# Patient Record
Sex: Male | Born: 1960 | Race: Black or African American | Hispanic: No | Marital: Single | Smoking: Never smoker
Health system: Southern US, Community
[De-identification: ages and names within clinical notes are randomized; demographics above are authoritative.]

## PROBLEM LIST (undated history)

## (undated) DIAGNOSIS — N135 Crossing vessel and stricture of ureter without hydronephrosis: Secondary | ICD-10-CM

## (undated) DIAGNOSIS — E559 Vitamin D deficiency, unspecified: Secondary | ICD-10-CM

## (undated) DIAGNOSIS — S0100XA Unspecified open wound of scalp, initial encounter: Secondary | ICD-10-CM

## (undated) DIAGNOSIS — I69351 Hemiplegia and hemiparesis following cerebral infarction affecting right dominant side: Secondary | ICD-10-CM

## (undated) DIAGNOSIS — I10 Essential (primary) hypertension: Secondary | ICD-10-CM

## (undated) DIAGNOSIS — N139 Obstructive and reflux uropathy, unspecified: Secondary | ICD-10-CM

## (undated) DIAGNOSIS — I2510 Atherosclerotic heart disease of native coronary artery without angina pectoris: Secondary | ICD-10-CM

## (undated) DIAGNOSIS — E876 Hypokalemia: Secondary | ICD-10-CM

## (undated) DIAGNOSIS — I739 Peripheral vascular disease, unspecified: Secondary | ICD-10-CM

## (undated) DIAGNOSIS — N1330 Unspecified hydronephrosis: Secondary | ICD-10-CM

## (undated) DIAGNOSIS — E785 Hyperlipidemia, unspecified: Secondary | ICD-10-CM

## (undated) DIAGNOSIS — K682 Retroperitoneal fibrosis: Secondary | ICD-10-CM

## (undated) DIAGNOSIS — I639 Cerebral infarction, unspecified: Secondary | ICD-10-CM

## (undated) DIAGNOSIS — I82409 Acute embolism and thrombosis of unspecified deep veins of unspecified lower extremity: Secondary | ICD-10-CM

## (undated) DIAGNOSIS — E872 Acidosis, unspecified: Secondary | ICD-10-CM

## (undated) DIAGNOSIS — M6281 Muscle weakness (generalized): Secondary | ICD-10-CM

## (undated) DIAGNOSIS — N138 Other obstructive and reflux uropathy: Secondary | ICD-10-CM

## (undated) DIAGNOSIS — J309 Allergic rhinitis, unspecified: Secondary | ICD-10-CM

## (undated) DIAGNOSIS — N401 Enlarged prostate with lower urinary tract symptoms: Secondary | ICD-10-CM

## (undated) DIAGNOSIS — G8190 Hemiplegia, unspecified affecting unspecified side: Secondary | ICD-10-CM

## (undated) DIAGNOSIS — N184 Chronic kidney disease, stage 4 (severe): Secondary | ICD-10-CM

## (undated) DIAGNOSIS — K219 Gastro-esophageal reflux disease without esophagitis: Secondary | ICD-10-CM

## (undated) DIAGNOSIS — E119 Type 2 diabetes mellitus without complications: Secondary | ICD-10-CM

## (undated) DIAGNOSIS — H409 Unspecified glaucoma: Secondary | ICD-10-CM

## (undated) DIAGNOSIS — K5900 Constipation, unspecified: Secondary | ICD-10-CM

## (undated) HISTORY — PX: NEPHROSTOMY TUBE PLACEMENT (ARMC HX): HXRAD1726

## (undated) HISTORY — DX: Acute embolism and thrombosis of unspecified deep veins of unspecified lower extremity: I82.409

## (undated) NOTE — *Deleted (*Deleted)
Pharmacy Resident Rounding Note - for learning purposes only, not an active part of the chart  Admit Complaint: n/v, fever, blood in percutaneous nephrostomy tube, elevated lactate up to 7.8. code sepsis. 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 CBGs 200s Gastrointestinal / Nutrition - on protonix, TF Neurology - on prop, trigs 200s Nephrology HCO3 down to 14, incr bicarb gtt  CRRT started overnight 11/27 for hyperkalemia,  possibly had a seizure  SCr on admit ~5 >> 2.1 11/29 BL ~2.5, variable   -11/30 K 3.1, phos 1.7  Pulmonary - severe diffuse encephalopathy, nonspecific but not seizure/epileptic. Intubated Prop @5 , midazolam @6 , fentanyl @100  Hematology / Oncology PTA Medication Issues Best Practices   Septic shock secondary to E.faecalis, alkalosis -cont antibiotics, f/u with TTE >> possible aortic valve veg -pH 7.7, d/cd bicarb gtt, adjusted vent settings  -pH down to 7.5  AoCKD IV -cont CRRT, changed bath from 2/2.5 to 4/2.5 11/29 -no off time so far -attempted to replace tube, unsuccessful  Encephalopathy + possible seizure - EEG neg for sz, wean versed

---

## 2016-11-08 ENCOUNTER — Encounter (HOSPITAL_COMMUNITY): Payer: Self-pay

## 2016-11-08 ENCOUNTER — Emergency Department (HOSPITAL_COMMUNITY): Payer: Medicaid Other

## 2016-11-08 ENCOUNTER — Emergency Department (HOSPITAL_COMMUNITY)
Admission: EM | Admit: 2016-11-08 | Discharge: 2016-11-08 | Disposition: A | Discharge status: Home / Self Care | Payer: Medicaid Other | Attending: Emergency Medicine | Admitting: Emergency Medicine

## 2016-11-08 DIAGNOSIS — Z79899 Other long term (current) drug therapy: Secondary | ICD-10-CM | POA: Insufficient documentation

## 2016-11-08 DIAGNOSIS — Y658 Other specified misadventures during surgical and medical care: Secondary | ICD-10-CM | POA: Insufficient documentation

## 2016-11-08 DIAGNOSIS — E119 Type 2 diabetes mellitus without complications: Secondary | ICD-10-CM | POA: Insufficient documentation

## 2016-11-08 DIAGNOSIS — T83022A Displacement of nephrostomy catheter, initial encounter: Secondary | ICD-10-CM | POA: Insufficient documentation

## 2016-11-08 DIAGNOSIS — I10 Essential (primary) hypertension: Secondary | ICD-10-CM | POA: Diagnosis not present

## 2016-11-08 DIAGNOSIS — N200 Calculus of kidney: Secondary | ICD-10-CM

## 2016-11-08 HISTORY — DX: Constipation, unspecified: K59.00

## 2016-11-08 HISTORY — DX: Unspecified glaucoma: H40.9

## 2016-11-08 HISTORY — DX: Essential (primary) hypertension: I10

## 2016-11-08 HISTORY — DX: Type 2 diabetes mellitus without complications: E11.9

## 2016-11-08 HISTORY — DX: Gastro-esophageal reflux disease without esophagitis: K21.9

## 2016-11-08 LAB — URINALYSIS, ROUTINE W REFLEX MICROSCOPIC
BILIRUBIN URINE: NEGATIVE
Glucose, UA: NEGATIVE mg/dL
KETONES UR: NEGATIVE mg/dL
NITRITE: POSITIVE — AB
PH: 7 (ref 5.0–8.0)
Protein, ur: NEGATIVE mg/dL
Specific Gravity, Urine: 1.006 (ref 1.005–1.030)

## 2016-11-08 NOTE — ED Notes (Signed)
Pt stable and ready for transport back to [place] via their transportation service.  Report called to Hermine Messick at [facility].

## 2016-11-08 NOTE — ED Provider Notes (Signed)
[facility] DEPT Provider Note   CSN: [id number] Arrival date & time: 11/08/16  1214  By signing my name below, I, Rayna Sexton, attest that this documentation has been prepared under the direction and in the presence of Carmin Muskrat, MD. Electronically Signed: Rayna Sexton, ED Scribe. 11/08/16. 12:49 PM.   History   Chief Complaint Chief Complaint  Patient presents with  . nephrostomy tube leaking    HPI HPI Comments: Brendan [name] is a 55 y.o. male who presents to the Emergency Department by ambulance from [facility] for evaluation of a leaking left sided nephrostomy tube x 1 day. He states it was pulled out yesterday and the staff attempted to fix it resulting in it leaking around the tube since. He is unsure of the urologist who initially placed the tube. Pt states the nephrostomy tube was placed due to a "bad kidney". Pt reports associated leg swelling but states it is chronic and denies it has acutely worsened. Before residing in [place] he was incarcerated. He denies fevers, chills, nausea, vomiting or diarrhea.    The history is provided by the patient and medical records. No language interpreter was used.    Past Medical History:  Diagnosis Date  . Constipation   . Diabetes mellitus without complication ([place])   . GERD (gastroesophageal reflux disease)   . Glaucoma   . Hypertension   . Renal disorder     There are no active problems to display for this patient.   Past Surgical History:  Procedure Laterality Date  . NEPHROSTOMY TUBE PLACEMENT ([place] [facility])         Home Medications    Prior to Admission medications   Not on File    Family History No family history on file.  Social History Social History  Substance Use Topics  . Smoking status: Not on file  . Smokeless tobacco: Not on file  . Alcohol use Not on file     Allergies   Other   Review of Systems Review of Systems  Constitutional:       Per HPI, otherwise negative    HENT:       Per HPI, otherwise negative  Respiratory:       Per HPI, otherwise negative  Cardiovascular:       Per HPI, otherwise negative  Gastrointestinal: Negative for vomiting.  Endocrine:       Negative aside from HPI  Genitourinary:       No hematuria, no dysuria. Patient does not know why he has a nephrostomy.  Musculoskeletal:       Per HPI, otherwise negative  Skin: Negative for color change.  Neurological: Negative for syncope.    Physical Exam Updated Vital Signs BP 140/95 (BP Location: Right Arm)   Pulse 79   Temp 98.4 F (36.9 C) (Oral)   Resp 16   Ht 6\' 1"  (1.854 m)   Wt 234 lb (106.1 kg)   SpO2 100%   BMI 30.87 kg/m   Physical Exam  Constitutional: He is oriented to person, place, and time. He appears well-developed. No distress.  HENT:  Head: Normocephalic and atraumatic.  Eyes: Conjunctivae and EOM are normal.  Cardiovascular: Normal rate and regular rhythm.   Pulmonary/Chest: Effort normal. No stridor. No respiratory distress.  Abdominal: He exhibits no distension.    Musculoskeletal: He exhibits no edema.  Neurological: He is alert and oriented to person, place, and time.  Skin: Skin is warm and dry.  Psychiatric: He has a  normal mood and affect. He is slowed.  Little insight  Nursing note and vitals reviewed.   ED Treatments / Results  Labs (all labs ordered are listed, but only abnormal results are displayed) Labs Reviewed  URINALYSIS, ROUTINE W REFLEX MICROSCOPIC - Abnormal; Notable for the following:       Result Value   Color, Urine STRAW (*)    Hgb urine dipstick LARGE (*)    Nitrite POSITIVE (*)    Leukocytes, UA LARGE (*)    Bacteria, UA RARE (*)    All other components within normal limits     Radiology Dg Abd 1 View  Result Date: 11/08/2016 CLINICAL DATA:  Making left nephrostomy tube EXAM: ABDOMEN - 1 VIEW COMPARISON:  None. FINDINGS: Abdominal films show that the ring marker is very lateral in position. This marker  should lie a over the expected renal pelvis approximately 10 cm more medially. Also the loop that generally overlies the renal pelvis is no longer present and the tube therefore is at risk for pulling out. The tip of the nephrostomy catheter extends into the expected distal left ureter. The bowel gas pattern is nonspecific. IMPRESSION: The tube appears to have retracted somewhat with the ring marker near the expected skin surface instead of overlying the left renal pelvis. Also the loop which is generally located at that site over the renal pelvis is no longer formed and therefore the tube is at risk for falling out. Electronically Signed   By: Ivar Drape M.D.   On: 11/08/2016 14:04    Procedures Procedures  DIAGNOSTIC STUDIES: Oxygen Saturation is 100% on RA, normal by my interpretation.    COORDINATION OF CARE: 12:46 PM Discussed next steps with pt. Pt verbalized understanding and is agreeable with the plan.    Medications Ordered in ED Medications - No data to display 3:54 PM  With a symptomatic bacteriuria, no empiric antibiotics indicated. We discussed the need for interventional radiology replacement of nephrostomy tube  Initial Impression / Assessment and Plan / ED Course  I have reviewed the triage vital signs and the nursing notes.  Pertinent labs & imaging results that were available during my care of the patient were reviewed by me and considered in my medical decision making (see chart for details).  Clinical Course     On repeat exam the patient is in no distress. I have discussed patient's case with our urology colleagues, and was interventional radiology. We confirmed that absent symptoms, patient will now receive empiric antibiotics. Patient will be scheduled for interventional radiology replacement of his nephrostomy tube tomorrow at one of our affiliated facilities He will be returned to his nursing facility, receive instructions in the morning as to which facility he  should go to tomorrow. The tube continues to drain urine, and absent distress, stable vital signs, semiurgent replacement tomorrow is reasonable.    Final Clinical Impressions(s) / ED Diagnoses  I personally performed the services described in this documentation, which was scribed in my presence. The recorded information has been reviewed and is accurate.      Carmin Muskrat, MD 11/08/16 [phone]

## 2016-11-08 NOTE — ED Triage Notes (Signed)
Pt from [facility] and sent to er for eval of nephrostomy tube leaking.  Denies any complaints.

## 2016-11-08 NOTE — Discharge Instructions (Signed)
Tomorrow you will receive a telephone call directing you to one of the [facility] facilities for replacement of your nephrostomy tube.  If you develop new, or concerning changes return here.

## 2016-11-09 ENCOUNTER — Other Ambulatory Visit (HOSPITAL_COMMUNITY): Payer: Self-pay | Admitting: Emergency Medicine

## 2016-11-09 ENCOUNTER — Ambulatory Visit (HOSPITAL_COMMUNITY)
Admission: RE | Admit: 2016-11-09 | Discharge: 2016-11-09 | Disposition: A | Discharge status: Home / Self Care | Payer: Medicaid Other | Source: Ambulatory Visit | Attending: Emergency Medicine | Admitting: Emergency Medicine

## 2016-11-09 ENCOUNTER — Encounter (HOSPITAL_COMMUNITY): Payer: Self-pay | Admitting: Interventional Radiology

## 2016-11-09 ENCOUNTER — Ambulatory Visit (HOSPITAL_COMMUNITY): Payer: Self-pay

## 2016-11-09 ENCOUNTER — Inpatient Hospital Stay (HOSPITAL_COMMUNITY): Admit: 2016-11-09 | Payer: Self-pay

## 2016-11-09 DIAGNOSIS — N1330 Unspecified hydronephrosis: Secondary | ICD-10-CM

## 2016-11-09 DIAGNOSIS — T83012A Breakdown (mechanical) of nephrostomy catheter, initial encounter: Secondary | ICD-10-CM | POA: Diagnosis not present

## 2016-11-09 DIAGNOSIS — Y732 Prosthetic and other implants, materials and accessory gastroenterology and urology devices associated with adverse incidents: Secondary | ICD-10-CM | POA: Insufficient documentation

## 2016-11-09 HISTORY — PX: IR GENERIC HISTORICAL: IMG1180011

## 2016-11-09 MED ORDER — LIDOCAINE HCL 1 % IJ SOLN
INTRAMUSCULAR | Status: AC
Start: 2016-11-09 — End: 2016-11-10
  Filled 2016-11-09: qty 20

## 2016-11-09 MED ORDER — IOPAMIDOL (ISOVUE-300) INJECTION 61%
INTRAVENOUS | Status: AC
  Administered 2016-11-09: 15 mL
  Filled 2016-11-09: qty 50

## 2016-11-09 MED ORDER — LIDOCAINE HCL 1 % IJ SOLN
INTRAMUSCULAR | Status: DC | PRN
  Administered 2016-11-09: 5 mL

## 2016-11-09 NOTE — Procedures (Signed)
Interventional Radiology Procedure Note  Patient presents for exchange of malfunctioning nephroureteral tube, which has been withdrawn from the kidney.  Patient tells me this has been placed in [place], but does not know where or by whom, or for what reason.    We have no records.    He was referred by [facility] ED when he came for "leaking", which is due to the sideholes out of the skin.   Procedure: Exchange of malfunctioning nephroureteral tube for a new 69F left PCN.    Complications: None  Recommendations:  - Keep to gravity drainage - follow up with urology and primary care.  Etiology of ureteral obstruction is unknown. - Routine care - Routine exchange if needed.   Signed,  Dulcy Fanny. Earleen Newport, DO

## 2016-12-19 ENCOUNTER — Other Ambulatory Visit: Payer: Self-pay | Admitting: Urology

## 2016-12-19 ENCOUNTER — Ambulatory Visit (INDEPENDENT_AMBULATORY_CARE_PROVIDER_SITE_OTHER): Payer: Self-pay | Admitting: Urology

## 2016-12-19 DIAGNOSIS — N1330 Unspecified hydronephrosis: Secondary | ICD-10-CM

## 2016-12-19 DIAGNOSIS — N135 Crossing vessel and stricture of ureter without hydronephrosis: Secondary | ICD-10-CM

## 2017-01-09 ENCOUNTER — Encounter (HOSPITAL_COMMUNITY): Payer: Self-pay | Admitting: Interventional Radiology

## 2017-01-09 ENCOUNTER — Other Ambulatory Visit: Payer: Self-pay | Admitting: Urology

## 2017-01-09 ENCOUNTER — Ambulatory Visit (HOSPITAL_COMMUNITY)
Admission: RE | Admit: 2017-01-09 | Discharge: 2017-01-09 | Disposition: A | Discharge status: Home / Self Care | Payer: Medicaid Other | Source: Ambulatory Visit | Attending: Urology | Admitting: Urology

## 2017-01-09 DIAGNOSIS — Z436 Encounter for attention to other artificial openings of urinary tract: Secondary | ICD-10-CM | POA: Diagnosis not present

## 2017-01-09 DIAGNOSIS — N1330 Unspecified hydronephrosis: Secondary | ICD-10-CM

## 2017-01-09 DIAGNOSIS — N135 Crossing vessel and stricture of ureter without hydronephrosis: Secondary | ICD-10-CM | POA: Diagnosis not present

## 2017-01-09 HISTORY — PX: IR GENERIC HISTORICAL: IMG1180011

## 2017-01-09 MED ORDER — IOPAMIDOL (ISOVUE-300) INJECTION 61%
INTRAVENOUS | Status: AC
  Filled 2017-01-09: qty 50

## 2017-01-09 MED ORDER — IOPAMIDOL (ISOVUE-300) INJECTION 61%
50.0000 mL | Freq: Once | INTRAVENOUS | Status: AC | PRN
  Administered 2017-01-09: 20 mL via INTRAVENOUS

## 2017-01-09 MED ORDER — LIDOCAINE HCL 1 % IJ SOLN
INTRAMUSCULAR | Status: AC
  Filled 2017-01-09: qty 20

## 2017-02-16 ENCOUNTER — Emergency Department (HOSPITAL_COMMUNITY): Payer: Medicaid Other

## 2017-02-16 ENCOUNTER — Encounter (HOSPITAL_COMMUNITY): Payer: Self-pay | Admitting: Emergency Medicine

## 2017-02-16 ENCOUNTER — Inpatient Hospital Stay (HOSPITAL_COMMUNITY)
Admission: EM | Admit: 2017-02-16 | Discharge: 2017-02-20 | DRG: 690 | Disposition: A | Discharge status: Home / Self Care | Payer: Medicaid Other | Attending: Family Medicine | Admitting: Family Medicine

## 2017-02-16 ENCOUNTER — Inpatient Hospital Stay (HOSPITAL_COMMUNITY): Admission: RE | Admit: 2017-02-16 | Payer: Medicaid Other | Source: Ambulatory Visit

## 2017-02-16 DIAGNOSIS — E785 Hyperlipidemia, unspecified: Secondary | ICD-10-CM | POA: Diagnosis present

## 2017-02-16 DIAGNOSIS — I639 Cerebral infarction, unspecified: Secondary | ICD-10-CM | POA: Diagnosis not present

## 2017-02-16 DIAGNOSIS — N189 Chronic kidney disease, unspecified: Secondary | ICD-10-CM | POA: Diagnosis not present

## 2017-02-16 DIAGNOSIS — N401 Enlarged prostate with lower urinary tract symptoms: Secondary | ICD-10-CM | POA: Diagnosis not present

## 2017-02-16 DIAGNOSIS — K219 Gastro-esophageal reflux disease without esophagitis: Secondary | ICD-10-CM | POA: Diagnosis present

## 2017-02-16 DIAGNOSIS — N138 Other obstructive and reflux uropathy: Secondary | ICD-10-CM | POA: Diagnosis not present

## 2017-02-16 DIAGNOSIS — N390 Urinary tract infection, site not specified: Secondary | ICD-10-CM

## 2017-02-16 DIAGNOSIS — H409 Unspecified glaucoma: Secondary | ICD-10-CM | POA: Insufficient documentation

## 2017-02-16 DIAGNOSIS — R55 Syncope and collapse: Secondary | ICD-10-CM

## 2017-02-16 DIAGNOSIS — K5900 Constipation, unspecified: Secondary | ICD-10-CM | POA: Diagnosis not present

## 2017-02-16 DIAGNOSIS — E1122 Type 2 diabetes mellitus with diabetic chronic kidney disease: Secondary | ICD-10-CM | POA: Diagnosis present

## 2017-02-16 DIAGNOSIS — S0990XA Unspecified injury of head, initial encounter: Secondary | ICD-10-CM

## 2017-02-16 DIAGNOSIS — Z7952 Long term (current) use of systemic steroids: Secondary | ICD-10-CM

## 2017-02-16 DIAGNOSIS — N179 Acute kidney failure, unspecified: Secondary | ICD-10-CM | POA: Diagnosis present

## 2017-02-16 DIAGNOSIS — K682 Retroperitoneal fibrosis: Secondary | ICD-10-CM | POA: Diagnosis present

## 2017-02-16 DIAGNOSIS — E119 Type 2 diabetes mellitus without complications: Secondary | ICD-10-CM | POA: Diagnosis not present

## 2017-02-16 DIAGNOSIS — I2510 Atherosclerotic heart disease of native coronary artery without angina pectoris: Secondary | ICD-10-CM | POA: Diagnosis present

## 2017-02-16 DIAGNOSIS — N184 Chronic kidney disease, stage 4 (severe): Secondary | ICD-10-CM | POA: Diagnosis present

## 2017-02-16 DIAGNOSIS — I10 Essential (primary) hypertension: Secondary | ICD-10-CM | POA: Diagnosis not present

## 2017-02-16 DIAGNOSIS — N135 Crossing vessel and stricture of ureter without hydronephrosis: Secondary | ICD-10-CM | POA: Diagnosis present

## 2017-02-16 DIAGNOSIS — W19XXXA Unspecified fall, initial encounter: Secondary | ICD-10-CM | POA: Diagnosis not present

## 2017-02-16 DIAGNOSIS — I69351 Hemiplegia and hemiparesis following cerebral infarction affecting right dominant side: Secondary | ICD-10-CM

## 2017-02-16 DIAGNOSIS — N12 Tubulo-interstitial nephritis, not specified as acute or chronic: Secondary | ICD-10-CM | POA: Diagnosis not present

## 2017-02-16 DIAGNOSIS — I959 Hypotension, unspecified: Secondary | ICD-10-CM

## 2017-02-16 DIAGNOSIS — M79604 Pain in right leg: Secondary | ICD-10-CM

## 2017-02-16 DIAGNOSIS — R531 Weakness: Secondary | ICD-10-CM

## 2017-02-16 HISTORY — DX: Hyperlipidemia, unspecified: E78.5

## 2017-02-16 HISTORY — DX: Benign prostatic hyperplasia with lower urinary tract symptoms: N40.1

## 2017-02-16 HISTORY — DX: Benign prostatic hyperplasia with lower urinary tract symptoms: N13.8

## 2017-02-16 HISTORY — DX: Muscle weakness (generalized): M62.81

## 2017-02-16 HISTORY — DX: Chronic kidney disease, stage 4 (severe): N18.4

## 2017-02-16 HISTORY — DX: Crossing vessel and stricture of ureter without hydronephrosis: N13.5

## 2017-02-16 HISTORY — DX: Hemiplegia and hemiparesis following cerebral infarction affecting right dominant side: I69.351

## 2017-02-16 HISTORY — DX: Essential (primary) hypertension: I10

## 2017-02-16 HISTORY — DX: Obstructive and reflux uropathy, unspecified: N13.9

## 2017-02-16 HISTORY — DX: Atherosclerotic heart disease of native coronary artery without angina pectoris: I25.10

## 2017-02-16 HISTORY — DX: Hypokalemia: E87.6

## 2017-02-16 HISTORY — DX: Cerebral infarction, unspecified: I63.9

## 2017-02-16 LAB — CBC WITH DIFFERENTIAL/PLATELET
BASOS ABS: 0 10*3/uL (ref 0.0–0.1)
Basophils Relative: 0 %
Eosinophils Absolute: 0.2 10*3/uL (ref 0.0–0.7)
Eosinophils Relative: 2 %
HEMATOCRIT: 37.3 % — AB (ref 39.0–52.0)
Hemoglobin: 12 g/dL — ABNORMAL LOW (ref 13.0–17.0)
LYMPHS PCT: 16 %
Lymphs Abs: 1.2 10*3/uL (ref 0.7–4.0)
MCH: 26.4 pg (ref 26.0–34.0)
MCHC: 32.2 g/dL (ref 30.0–36.0)
MCV: 82 fL (ref 78.0–100.0)
Monocytes Absolute: 0.4 10*3/uL (ref 0.1–1.0)
Monocytes Relative: 5 %
NEUTROS ABS: 5.9 10*3/uL (ref 1.7–7.7)
Neutrophils Relative %: 77 %
Platelets: 411 10*3/uL — ABNORMAL HIGH (ref 150–400)
RBC: 4.55 MIL/uL (ref 4.22–5.81)
RDW: 16.5 % — AB (ref 11.5–15.5)
WBC: 7.6 10*3/uL (ref 4.0–10.5)

## 2017-02-16 LAB — URINALYSIS, ROUTINE W REFLEX MICROSCOPIC
Bilirubin Urine: NEGATIVE
GLUCOSE, UA: NEGATIVE mg/dL
Ketones, ur: NEGATIVE mg/dL
Nitrite: NEGATIVE
Protein, ur: 30 mg/dL — AB
Specific Gravity, Urine: 1.01 (ref 1.005–1.030)
pH: 7 (ref 5.0–8.0)

## 2017-02-16 LAB — COMPREHENSIVE METABOLIC PANEL
ALBUMIN: 3.1 g/dL — AB (ref 3.5–5.0)
ALT: 32 U/L (ref 17–63)
AST: 61 U/L — AB (ref 15–41)
Alkaline Phosphatase: 93 U/L (ref 38–126)
Anion gap: 9 (ref 5–15)
BUN: 39 mg/dL — ABNORMAL HIGH (ref 6–20)
CHLORIDE: 99 mmol/L — AB (ref 101–111)
CO2: 30 mmol/L (ref 22–32)
CREATININE: 2.59 mg/dL — AB (ref 0.61–1.24)
Calcium: 9.7 mg/dL (ref 8.9–10.3)
GFR calc non Af Amer: 26 mL/min — ABNORMAL LOW (ref >60)
GFR, EST AFRICAN AMERICAN: 30 mL/min — AB (ref >60)
GLUCOSE: 87 mg/dL (ref 65–99)
Potassium: 4.7 mmol/L (ref 3.5–5.1)
SODIUM: 138 mmol/L (ref 135–145)
Total Bilirubin: 0.2 mg/dL — ABNORMAL LOW (ref 0.3–1.2)
Total Protein: 9.5 g/dL — ABNORMAL HIGH (ref 6.5–8.1)

## 2017-02-16 LAB — TROPONIN I: TROPONIN I: 0.03 ng/mL — AB (ref <0.03)

## 2017-02-16 LAB — LIPASE, BLOOD: Lipase: 27 U/L (ref 11–51)

## 2017-02-16 LAB — URINALYSIS, MICROSCOPIC (REFLEX)

## 2017-02-16 LAB — GLUCOSE, CAPILLARY: GLUCOSE-CAPILLARY: 84 mg/dL (ref 65–99)

## 2017-02-16 LAB — I-STAT CG4 LACTIC ACID, ED: Lactic Acid, Venous: 1.61 mmol/L (ref 0.5–1.9)

## 2017-02-16 MED ORDER — DRONABINOL 2.5 MG PO CAPS
2.5000 mg | ORAL_CAPSULE | Freq: Every day | ORAL | Status: DC
  Administered 2017-02-16: 2.5 mg via ORAL
  Filled 2017-02-16: qty 1

## 2017-02-16 MED ORDER — POLYETHYLENE GLYCOL 3350 17 G PO PACK
17.0000 g | PACK | Freq: Every day | ORAL | Status: DC
  Administered 2017-02-17 – 2017-02-20 (×4): 17 g via ORAL
  Filled 2017-02-16 (×5): qty 1

## 2017-02-16 MED ORDER — ONDANSETRON HCL 4 MG/2ML IJ SOLN: 4.0000 mg | Freq: Four times a day (QID) | INTRAMUSCULAR | Status: DC | PRN

## 2017-02-16 MED ORDER — INSULIN ASPART 100 UNIT/ML ~~LOC~~ SOLN: 0.0000 [IU] | Freq: Every day | SUBCUTANEOUS | Status: DC

## 2017-02-16 MED ORDER — FERROUS SULFATE 325 (65 FE) MG PO TABS
325.0000 mg | ORAL_TABLET | Freq: Every day | ORAL | Status: DC
  Administered 2017-02-17 – 2017-02-20 (×4): 325 mg via ORAL
  Filled 2017-02-16 (×5): qty 1

## 2017-02-16 MED ORDER — TAMSULOSIN HCL 0.4 MG PO CAPS
0.4000 mg | ORAL_CAPSULE | Freq: Every day | ORAL | Status: DC
  Administered 2017-02-16 – 2017-02-19 (×4): 0.4 mg via ORAL
  Filled 2017-02-16 (×4): qty 1

## 2017-02-16 MED ORDER — SIMVASTATIN 20 MG PO TABS
40.0000 mg | ORAL_TABLET | Freq: Every day | ORAL | Status: DC
Start: 2017-02-16 — End: 2017-02-20
  Administered 2017-02-16 – 2017-02-19 (×4): 40 mg via ORAL
  Filled 2017-02-16 (×4): qty 2

## 2017-02-16 MED ORDER — INSULIN ASPART 100 UNIT/ML ~~LOC~~ SOLN: 0.0000 [IU] | Freq: Three times a day (TID) | SUBCUTANEOUS | Status: DC

## 2017-02-16 MED ORDER — HYDROCODONE-ACETAMINOPHEN 5-325 MG PO TABS
1.0000 | ORAL_TABLET | Freq: Four times a day (QID) | ORAL | Status: DC | PRN
Start: 2017-02-16 — End: 2017-02-20

## 2017-02-16 MED ORDER — POLYETHYLENE GLYCOL 3350 17 G PO PACK: 17.0000 g | PACK | Freq: Every day | ORAL | Status: DC | PRN

## 2017-02-16 MED ORDER — ENOXAPARIN SODIUM 40 MG/0.4ML ~~LOC~~ SOLN
40.0000 mg | SUBCUTANEOUS | Status: DC
  Administered 2017-02-16 – 2017-02-19 (×4): 40 mg via SUBCUTANEOUS
  Filled 2017-02-16 (×4): qty 0.4

## 2017-02-16 MED ORDER — SODIUM CHLORIDE 0.9 % IV BOLUS (SEPSIS)
500.0000 mL | Freq: Once | INTRAVENOUS | Status: AC
  Administered 2017-02-16: 500 mL via INTRAVENOUS

## 2017-02-16 MED ORDER — DEXTROSE 5 % IV SOLN
1.0000 g | INTRAVENOUS | Status: DC
  Filled 2017-02-16 (×2): qty 10

## 2017-02-16 MED ORDER — ONDANSETRON HCL 4 MG PO TABS: 4.0000 mg | ORAL_TABLET | Freq: Four times a day (QID) | ORAL | Status: DC | PRN

## 2017-02-16 MED ORDER — ASPIRIN 81 MG PO CHEW
81.0000 mg | CHEWABLE_TABLET | Freq: Every day | ORAL | Status: DC
  Administered 2017-02-17 – 2017-02-20 (×4): 81 mg via ORAL
  Filled 2017-02-16 (×4): qty 1

## 2017-02-16 MED ORDER — DEXTROSE 5 % IV SOLN
2.0000 g | Freq: Once | INTRAVENOUS | Status: AC
  Administered 2017-02-16: 2 g via INTRAVENOUS
  Filled 2017-02-16: qty 2

## 2017-02-16 MED ORDER — ACETAMINOPHEN 325 MG PO TABS: 650.0000 mg | ORAL_TABLET | Freq: Four times a day (QID) | ORAL | Status: DC | PRN

## 2017-02-16 MED ORDER — ACETAMINOPHEN 650 MG RE SUPP: 650.0000 mg | Freq: Four times a day (QID) | RECTAL | Status: DC | PRN

## 2017-02-16 MED ORDER — SODIUM BICARBONATE 650 MG PO TABS
650.0000 mg | ORAL_TABLET | Freq: Two times a day (BID) | ORAL | Status: DC
  Administered 2017-02-16 – 2017-02-20 (×8): 650 mg via ORAL
  Filled 2017-02-16 (×8): qty 1

## 2017-02-16 MED ORDER — SODIUM CHLORIDE 0.9 % IV SOLN
INTRAVENOUS | Status: DC
  Administered 2017-02-16 – 2017-02-19 (×3): via INTRAVENOUS

## 2017-02-16 NOTE — ED Notes (Signed)
[facility] informed at this time that resident was going to be admitted to the hospital.

## 2017-02-16 NOTE — H&P (Signed)
History and Physical  [name] UJW:[id number] DOB: 04-21-1961 DOA: 02/16/2017  Referring physician: Dr. Thurnell Garbe, ED physician PCP: Brendan Mccreedy, MD  Outpatient Specialists: None  Patient [name]  Chief Complaint: Fall  HPI: [name] is a 56 y.o. male with a history of obstructive uropathy with left-sided nephrostomy tube, retroperitoneal fibrosis with ureteral stricture, GERD, diabetes type 2 diet-controlled, hypertension, hyperlipidemia, history of stroke with residual right-sided hemiparesis, chronic kidney disease. He was recently treated for UTI at the beginning of month with stopping antibiotics around the sixth. He has been on CellCept due to the obstructive uropathy and retroperitoneal fibrosis, however this is been held due to concerns that the CellCept may be causing recurrent urinary tract infections.  He was being transported to [name] for nephrostomy tube replacement. During the transport, the patient was attempting to stand to transport when his lower leg got caught patient fell forward and hit his head. Patient was brought to the [name] for evaluation. He is unable to say whether he had loss of consciousness or not. He has been increasingly weak with lack of appetite over the past several weeks. This appears to have started after a viral gastroenteritis infection. He still has some nausea and diminished appetite. Is supposed to start an appetite stimulant (Marinol) however he has not started yet. No palliating or provoking factors.  Emergency Department Course: Head CT was negative for acute bleed or injury. X-ray was negative and lower leg for fracture. Urinalysis shows large leukocytes with white blood cells too numerous to count on the micro-. Negative nitrites.  Review of Systems:   Patient complains of chills, nausea.  Pt denies any fevers, diarrhea, constipation, abdominal pain, shortness of breath, dyspnea on exertion,  orthopnea, cough, wheezing, palpitations, headache, vision changes, lightheadedness, dizziness, melena, rectal bleeding.  Review of systems are otherwise negative  Past Medical History:  Diagnosis Date  . Acute unilateral obstructive uropathy   . Chronic kidney disease, stage IV (severe) ([place])   . Constipation   . Coronary atherosclerosis of native coronary artery   . Diabetes mellitus without complication ([place])   . Enlarged prostate with urinary obstruction   . Essential hypertension, malignant   . GERD (gastroesophageal reflux disease)   . Glaucoma   . Hemiparesis affecting right side as late effect of cerebrovascular accident ([place])   . Hyperlipidemia   . Hypertension   . Hypokalemia   . Muscle weakness (generalized)   . Renal disorder   . Stricture or kinking of ureter   . Stroke [name])    Past Surgical History:  Procedure Laterality Date  . IR GENERIC HISTORICAL  11/09/2016   IR NEPHROSTOMY EXCHANGE LEFT 11/09/2016 Corrie Mckusick, DO [facility]-INTER[facility] RAD  . IR GENERIC HISTORICAL  01/09/2017   IR NEPHROSTOMY EXCHANGE LEFT 01/09/2017 [name], MD [facility]INTER[facility] RAD  . NEPHROSTOMY TUBE PLACEMENT ([place] [facility])     Social History:  has no tobacco, alcohol, and drug history on file. Patient lives [name]  Allergies  Allergen Reactions  . Other     TB serum-reaction unknown. Provided via [name] records     No family history on file.  Patient unable to recall family history  Prior to Admission medications   Medication Sig Start Date End Date Taking? Authorizing Provider  acetaminophen (TYLENOL) 325 MG tablet Take 650 mg by mouth 3 (three) times daily as needed for mild pain or moderate pain.   Yes Historical Provider, MD  aspirin 81  MG chewable tablet Chew 81 mg by mouth daily.   Yes Historical Provider, MD  calcium carbonate (CALTRATE 600) 1500 (600 Ca) MG TABS tablet Take 1 tablet by mouth 2 (two) times daily.   Yes Historical Provider, MD  cholecalciferol (VITAMIN D) 400  units TABS tablet Take 400 Units by mouth daily.   Yes Historical Provider, MD  dronabinol (MARINOL) 2.5 MG capsule Take 2.5 mg by mouth at bedtime.   Yes Historical Provider, MD  ferrous sulfate 325 (65 FE) MG tablet Take 325 mg by mouth daily with breakfast.   Yes Historical Provider, MD  furosemide (LASIX) 40 MG tablet Take 40 mg by mouth daily.   Yes Historical Provider, MD  HYDROcodone-acetaminophen (NORCO/VICODIN) 5-325 MG tablet Take 1 tablet by mouth every 6 (six) hours as needed for moderate pain.   Yes Historical Provider, MD  mycophenolate (CELLCEPT) 500 MG tablet Take 1,000 mg by mouth every 12 (twelve) hours.   Yes Historical Provider, MD  polyethylene glycol (MIRALAX / GLYCOLAX) packet Take 17 g by mouth daily.   Yes Historical Provider, MD  Probiotic Product (PROBIOTIC PO) Take 1 capsule by mouth daily.   Yes Historical Provider, MD  simvastatin (ZOCOR) 40 MG tablet Take 40 mg by mouth at bedtime.   Yes Historical Provider, MD  sodium bicarbonate 650 MG tablet Take 650 mg by mouth every 12 (twelve) hours.   Yes Historical Provider, MD  tamsulosin (FLOMAX) 0.4 MG CAPS capsule Take 0.4 mg by mouth at bedtime.   Yes Historical Provider, MD    Physical Exam: BP 110/83   Pulse 90   Temp 98 F (36.7 C) (Oral)   Resp 10   Ht 6\' 1"  (1.854 m)   SpO2 100%   General: Middle-aged black male appears older than stated age. Awake and alert and oriented x3. No acute cardiopulmonary distress.  HEENT: Normocephalic atraumatic.  Right and left ears normal in appearance.  Pupils equal, round, reactive to light. Extraocular muscles are intact. Sclerae anicteric and noninjected.  Moist mucosal membranes. No mucosal lesions.  Neck: Neck supple without lymphadenopathy. No carotid bruits. No masses palpated.  Cardiovascular: Regular rate with normal S1-S2 sounds. No murmurs, rubs, gallops auscultated. No JVD.  Respiratory: Good respiratory effort with no wheezes, rales, rhonchi. Lungs clear to  auscultation bilaterally.  No accessory muscle use. Abdomen: Soft, nontender, nondistended. Active bowel sounds. No masses or hepatosplenomegaly  Skin: No rashes, lesions, or ulcerations.  Dry, warm to touch. 2+ dorsalis pedis and radial pulses. Musculoskeletal: No calf or leg pain. All major joints not erythematous nontender.  No upper or lower joint deformation.  Good ROM.  No contractures  Psychiatric: Intact judgment and insight. Pleasant and cooperative. Neurologic: No focal neurological deficits. Strength is 5/5 and symmetric in upper and lower extremities.  Cranial nerves II through XII are grossly intact.           Labs on Admission: I have personally reviewed following labs and imaging studies  CBC:  Recent Labs Lab 02/16/17 1337  WBC 7.6  NEUTROABS 5.9  HGB 12.0*  HCT 37.3*  MCV 82.0  PLT 371*   Basic Metabolic Panel:  Recent Labs Lab 02/16/17 1337  NA 138  K 4.7  CL 99*  CO2 30  GLUCOSE 87  BUN 39*  CREATININE 2.59*  CALCIUM 9.7   GFR: CrCl cannot be calculated (Unknown ideal weight.). Liver Function Tests:  Recent Labs Lab 02/16/17 1337  AST 61*  ALT 32  ALKPHOS 93  BILITOT 0.2*  PROT 9.5*  ALBUMIN 3.1*    Recent Labs Lab 02/16/17 1337  LIPASE 27   No results for input(s): AMMONIA in the last 168 hours. Coagulation Profile: No results for input(s): INR, PROTIME in the last 168 hours. Cardiac Enzymes:  Recent Labs Lab 02/16/17 1337  TROPONINI 0.03*   BNP (last 3 results) No results for input(s): PROBNP in the last 8760 hours. HbA1C: No results for input(s): HGBA1C in the last 72 hours. CBG: No results for input(s): GLUCAP in the last 168 hours. Lipid Profile: No results for input(s): CHOL, HDL, LDLCALC, TRIG, CHOLHDL, LDLDIRECT in the last 72 hours. Thyroid Function Tests: No results for input(s): TSH, T4TOTAL, FREET4, T3FREE, THYROIDAB in the last 72 hours. Anemia Panel: No results for input(s): VITAMINB12, FOLATE, FERRITIN,  TIBC, IRON, RETICCTPCT in the last 72 hours. Urine analysis:    Component Value Date/Time   COLORURINE YELLOW 02/16/2017 1334   APPEARANCEUR HAZY (A) 02/16/2017 1334   LABSPEC 1.010 02/16/2017 1334   PHURINE 7.0 02/16/2017 1334   GLUCOSEU NEGATIVE 02/16/2017 1334   HGBUR MODERATE (A) 02/16/2017 1334   BILIRUBINUR NEGATIVE 02/16/2017 1334   KETONESUR NEGATIVE 02/16/2017 1334   PROTEINUR 30 (A) 02/16/2017 1334   NITRITE NEGATIVE 02/16/2017 1334   LEUKOCYTESUR LARGE (A) 02/16/2017 1334   Sepsis Labs: @LABRCNTIP (procalcitonin:4,lacticidven:4) )No results found for this or any previous visit (from the past 240 hour(s)).   Radiological Exams on Admission: Dg Chest 1 View  Result Date: 02/16/2017 CLINICAL DATA:  Pain following fall EXAM: CHEST 1 VIEW COMPARISON:  None. FINDINGS: Lungs are clear. Heart size and pulmonary vascularity are normal. No adenopathy. No pneumothorax. No bone lesions. IMPRESSION: No edema or consolidation. Electronically Signed   By: Lowella Grip III M.D.   On: 02/16/2017 15:07   Dg Ankle Complete Right  Result Date: 02/16/2017 CLINICAL DATA:  Pain following fall EXAM: RIGHT ANKLE - COMPLETE 3+ VIEW COMPARISON:  None. FINDINGS: Frontal, oblique, and lateral views were obtained. There is mild soft tissue swelling. No fracture or joint effusion. The ankle mortise appears intact. No erosive change. IMPRESSION: Soft tissue swelling. No demonstrable fracture or appreciable joint space narrowing. Ankle mortise appears intact. Electronically Signed   By: Lowella Grip III M.D.   On: 02/16/2017 15:10   Ct Head Wo Contrast  Result Date: 02/16/2017 CLINICAL DATA:  Possible syncope today. The patient fell out of a wheelchair. EXAM: CT HEAD WITHOUT CONTRAST CT CERVICAL SPINE WITHOUT CONTRAST TECHNIQUE: Multidetector CT imaging of the head and cervical spine was performed following the standard protocol without intravenous contrast. Multiplanar CT image reconstructions of  the cervical spine were also generated. COMPARISON:  None. FINDINGS: CT HEAD FINDINGS Brain: Remote left MCA territory infarct is identified. No acute abnormality including hemorrhage, infarct, mass lesion, mass effect, midline shift or abnormal extra-axial fluid collection. No hydrocephalus or pneumocephalus. Vascular: Mild atherosclerosis is identified. Skull: Intact. Sinuses/Orbits: There is marked mucosal thickening with calcifications and sclerosis of the walls of the maxillary sinuses. Scattered ethmoid air cell disease is identified. Other: None. CT CERVICAL SPINE FINDINGS Alignment: The neck is in flexion.  No listhesis is identified. Skull base and vertebrae: No acute fracture. No primary bone lesion or focal pathologic process. Soft tissues and spinal canal: No prevertebral fluid or swelling. No visible canal hematoma. Disc levels:  Mild endplate spurring is seen at C5-6 and C6-7. Upper chest: Lung apices are clear. Other: None. IMPRESSION: No acute abnormality head or cervical spine. Remote left MCA infarct  and chronic microvascular ischemic change. Chronic bilateral maxillary sinus disease. Mild degenerative disc disease C5-6 and C6-7. Electronically Signed   By: Inge Rise M.D.   On: 02/16/2017 14:41   Ct Cervical Spine Wo Contrast  Result Date: 02/16/2017 CLINICAL DATA:  Possible syncope today. The patient fell out of a wheelchair. EXAM: CT HEAD WITHOUT CONTRAST CT CERVICAL SPINE WITHOUT CONTRAST TECHNIQUE: Multidetector CT imaging of the head and cervical spine was performed following the standard protocol without intravenous contrast. Multiplanar CT image reconstructions of the cervical spine were also generated. COMPARISON:  None. FINDINGS: CT HEAD FINDINGS Brain: Remote left MCA territory infarct is identified. No acute abnormality including hemorrhage, infarct, mass lesion, mass effect, midline shift or abnormal extra-axial fluid collection. No hydrocephalus or pneumocephalus. Vascular:  Mild atherosclerosis is identified. Skull: Intact. Sinuses/Orbits: There is marked mucosal thickening with calcifications and sclerosis of the walls of the maxillary sinuses. Scattered ethmoid air cell disease is identified. Other: None. CT CERVICAL SPINE FINDINGS Alignment: The neck is in flexion.  No listhesis is identified. Skull base and vertebrae: No acute fracture. No primary bone lesion or focal pathologic process. Soft tissues and spinal canal: No prevertebral fluid or swelling. No visible canal hematoma. Disc levels:  Mild endplate spurring is seen at C5-6 and C6-7. Upper chest: Lung apices are clear. Other: None. IMPRESSION: No acute abnormality head or cervical spine. Remote left MCA infarct and chronic microvascular ischemic change. Chronic bilateral maxillary sinus disease. Mild degenerative disc disease C5-6 and C6-7. Electronically Signed   By: Inge Rise M.D.   On: 02/16/2017 14:41   Dg Knee Complete 4 Views Right  Result Date: 02/16/2017 CLINICAL DATA:  Pain following fall EXAM: RIGHT KNEE - COMPLETE 4+ VIEW COMPARISON:  None. FINDINGS: Frontal, lateral, and bilateral oblique views were obtained. There is soft tissue swelling. There is a moderate joint effusion. No fracture or dislocation. There is no appreciable joint space narrowing. IMPRESSION: Soft tissue swelling with moderate joint effusion. No acute fracture or dislocation. No appreciable arthropathic change. Electronically Signed   By: Lowella Grip III M.D.   On: 02/16/2017 15:09   Dg Hip Unilat With Pelvis 2-3 Views Right  Result Date: 02/16/2017 CLINICAL DATA:  Pain following fall EXAM: DG HIP (WITH OR WITHOUT PELVIS) 2-3V RIGHT COMPARISON:  None. FINDINGS: Frontal pelvis as well as frontal and lateral right hip images were obtained. There is mild symmetric narrowing of both hip joints. There is no fracture or dislocation. No erosive change. There are foci of atherosclerotic calcification in the external iliac arteries  bilaterally. IMPRESSION: Mild symmetric narrowing both hip joints. No fracture or dislocation. There is iliac artery atherosclerosis. Electronically Signed   By: Lowella Grip III M.D.   On: 02/16/2017 15:08    EKG: Independently reviewed. Sinus rhythm. No acute ST changes.  Assessment/Plan: Principal Problem:   Pyelonephritis Active Problems:   Coronary atherosclerosis of native coronary artery   Hypertension   Diabetes mellitus without complication (H[facility])   Chronic kidney disease, stage IV (severe) ([facility])   Hemiparesis affecting right side as late effect of cerebrovascular accident [name])   Retroperitoneal fibrosis    This patient was discussed with the ED physician, including pertinent vitals, physical exam findings, labs, and imaging.  We also discussed care given by the ED provider.  #1 Pyelonephritis  Admit  Urine cultures  Ceftriaxone 1 g every 24 hours  Repeat CBC in the morning #2 Hypertension  Hold Lasix due to slightly low blood pressure, which is  responding to fluids #3 diabetes  Slight scale insulin #4 chronic kidney disease stage IV  Repeat creatinine in the morning #5 retroperitoneal fibrosis  I had a conversation with Dr. Ainsley Spinner fairly well. Patient has been on CellCept due to the retroperitoneal fibrosis.  We'll continue to hold CellCept due to recurrent UTI. Nephrostomy tube is due to be replaced, which should be done after acute infection has cleared #6 hemiparesis with right-sided deficits  No worsening function #7 coronary artery disease  DVT prophylaxis: Lovenox Consultants: None Code Status: Full code Family Communication: None  Disposition Plan: Patient should be able to return home to [name] following admission   Truett Mainland, DO [facility] Pager [phone]  If 7PM-7AM, please contact night-coverage [email or web address] Password [email or web address]

## 2017-02-16 NOTE — ED Notes (Signed)
Patient transported to X-ray 

## 2017-02-16 NOTE — ED Provider Notes (Signed)
[facility] DEPT Provider Note   CSN: [id number] Arrival date & time: 02/16/17  1256     History   Chief Complaint Chief Complaint  Patient presents with  . Fall  . Leg Pain    HPI Brendan [name] is a 56 y.o. male.  HPI  Pt was seen at 1325. Per pt, c/o sudden onset and resolution of one episode of fall that occurred PTA. Pt states he was being transported in a wheelchair to a doctors appointment when his right leg got caught underneath the wheelchair and pt fell out of it. Pt states he hit his head on the ground and "might have passed out." Pt has hx right sided paresis s/p CVA, c/o right leg pain. Pt also c/o generalized weakness/fatigue for the past several weeks. Pt states he has had "a few" episodes of N/V 3 days ago, since resolved, but poor PO intake continues.  Denies diarrhea, no back pain, no abd pain, no CP/SOB, no cough, no fevers, no black or blood in emesis.   Past Medical History:  Diagnosis Date  . Acute unilateral obstructive uropathy   . Chronic kidney disease, stage IV (severe) ([place])   . Constipation   . Coronary atherosclerosis of native coronary artery   . Diabetes mellitus without complication ([place])   . Enlarged prostate with urinary obstruction   . Essential hypertension, malignant   . GERD (gastroesophageal reflux disease)   . Glaucoma   . Hemiparesis affecting right side as late effect of cerebrovascular accident ([place])   . Hyperlipidemia   . Hypertension   . Hypokalemia   . Muscle weakness (generalized)   . Renal disorder   . Stricture or kinking of ureter   . Stroke [facility])     There are no active problems to display for this patient.   Past Surgical History:  Procedure Laterality Date  . IR GENERIC HISTORICAL  11/09/2016   IR NEPHROSTOMY EXCHANGE LEFT 11/09/2016 Corrie Mckusick, DO [facility]-INTERV RAD  . IR GENERIC HISTORICAL  01/09/2017   IR NEPHROSTOMY EXCHANGE LEFT 01/09/2017 [name], MD [facility]-INTERV RAD  . NEPHROSTOMY TUBE PLACEMENT ([place] [facility])          Home Medications    Prior to Admission medications   Medication Sig Start Date End Date Taking? Authorizing Provider  acetaminophen (TYLENOL) 325 MG tablet Take 650 mg by mouth 3 (three) times daily as needed for mild pain or moderate pain.    Historical Provider, MD  bisacodyl (DULCOLAX) 5 MG EC tablet Take 5 mg by mouth daily as needed for moderate constipation.    Historical Provider, MD  calcium carbonate (CALTRATE 600) 1500 (600 Ca) MG TABS tablet Take 1 tablet by mouth 2 (two) times daily.    Historical Provider, MD  cholecalciferol (VITAMIN D) 400 units TABS tablet Take 400 Units by mouth daily.    Historical Provider, MD  docusate sodium (COLACE) 100 MG capsule Take 100 mg by mouth 2 (two) times daily.    Historical Provider, MD  furosemide (LASIX) 80 MG tablet Take 80 mg by mouth daily.    Historical Provider, MD  mycophenolate (CELLCEPT) 500 MG tablet Take 1,000 mg by mouth every 12 (twelve) hours.    Historical Provider, MD  potassium chloride (KLOR-CON 10) 10 MEQ tablet Take 10 mEq by mouth daily.    Historical Provider, MD  predniSONE (DELTASONE) 5 MG tablet Take 5 mg by mouth daily with breakfast.    Historical Provider, MD  simvastatin (ZOCOR) 40 MG tablet Take  40 mg by mouth at bedtime.    Historical Provider, MD  sodium bicarbonate 650 MG tablet Take 650 mg by mouth every 12 (twelve) hours.    Historical Provider, MD  tamsulosin (FLOMAX) 0.4 MG CAPS capsule Take 0.4 mg by mouth at bedtime.    Historical Provider, MD    Family History No family history on file.  Social History Social History  Substance Use Topics  . Smoking status: Not on file  . Smokeless tobacco: Not on file  . Alcohol use Not on file     Allergies   Other   Review of Systems Review of Systems ROS: Statement: All systems negative except as marked or noted in the HPI; Constitutional: Negative for fever and chills. ; ; Eyes: Negative for eye pain, redness and discharge. ; ; ENMT:  Negative for ear pain, hoarseness, nasal congestion, sinus pressure and sore throat. ; ; Cardiovascular: Negative for chest pain, palpitations, diaphoresis, dyspnea and peripheral edema. ; ; Respiratory: Negative for cough, wheezing and stridor. ; ; Gastrointestinal: +N/V. Negative for diarrhea, abdominal pain, blood in stool, hematemesis, jaundice and rectal bleeding. . ; ; Genitourinary: Negative for dysuria, flank pain and hematuria. ; ; Musculoskeletal: +head injury, right leg pain. Negative for back pain and neck pain. Negative for swelling.; ; Skin: Negative for pruritus, rash, abrasions, blisters, bruising and skin lesion.; ; Neuro: Negative for headache, lightheadedness and neck stiffness. Negative for extremity weakness, paresthesias, involuntary movement, seizure and +brief syncope, generalized weakness.     Physical Exam Updated Vital Signs BP 94/74 (BP Location: Left Arm)   Pulse 86   Temp 98 F (36.7 C) (Oral)   Resp 11   Ht 6\' 1"  (1.854 m)   SpO2 99%   15:35:05 Orthostatic Vital Signs RM  Orthostatic Lying   BP- Lying: 100/82  Pulse- Lying: 82      Orthostatic Sitting  BP- Sitting: 100/85  Pulse- Sitting: 96  15:35:21 ED Notes RM  PT unable to stand for ortho vital signs    Physical Exam 1330: Physical examination:  Nursing notes reviewed; Vital signs and O2 SAT reviewed;  Constitutional: Well developed, Well nourished, In no acute distress; Head:  Normocephalic, atraumatic; Eyes: EOMI, PERRL, No scleral icterus; ENMT: Mouth and pharynx normal, Mucous membranes dry; Neck: Supple, Full range of motion, No lymphadenopathy; Cardiovascular: Regular rate and rhythm, No gallop; Respiratory: Breath sounds clear & equal bilaterally, No wheezes.  Speaking full sentences with ease, Normal respiratory effort/excursion; Chest: Nontender, Movement normal; Abdomen: Soft, Nontender, Nondistended, Normal bowel sounds; Genitourinary: No CVA tenderness. +left nephrostomy tube with cloudy  yellow urine in tubing and bag. No obvious hematuria.; Extremities: Pulses normal, pelvis stable. No deformity, No edema, No calf edema or asymmetry.; Neuro: AA&Ox3, Major CN grossly intact.  Speech clear. +right sided paresis per hx, otherwise no gross focal motor deficits in extremities.; Skin: Color normal, Warm, Dry.   ED Treatments / Results  Labs (all labs ordered are listed, but only abnormal results are displayed)   EKG  EKG Interpretation  Date/Time:  Friday February 16 2017 13:21:19 EDT Ventricular Rate:  88 PR Interval:    QRS Duration: 85 QT Interval:  356 QTC Calculation: 431 R Axis:   58 Text Interpretation:  Sinus rhythm Borderline T wave abnormalities No old tracing to compare Confirmed by [facility]  MD, Nunzio Cory [phone]) on 02/16/2017 1:36:16 PM       Radiology   Procedures Procedures (including critical care time)  Medications Ordered in ED Medications -  No data to display   Initial Impression / Assessment and Plan / ED Course  I have reviewed the triage vital signs and the nursing notes.  Pertinent labs & imaging results that were available during my care of the patient were reviewed by me and considered in my medical decision making (see chart for details).  MDM Reviewed: previous chart, vitals and nursing note Reviewed previous: labs and ECG Interpretation: labs, ECG, x-ray and CT scan   Results for orders placed or performed during the hospital encounter of 02/16/17  Comprehensive metabolic panel  Result Value Ref Range   Sodium 138 135 - 145 mmol/L   Potassium 4.7 3.5 - 5.1 mmol/L   Chloride 99 (L) 101 - 111 mmol/L   CO2 30 22 - 32 mmol/L   Glucose, Bld 87 65 - 99 mg/dL   BUN 39 (H) 6 - 20 mg/dL   Creatinine, Ser 2.59 (H) 0.61 - 1.24 mg/dL   Calcium 9.7 8.9 - 10.3 mg/dL   Total Protein 9.5 (H) 6.5 - 8.1 g/dL   Albumin 3.1 (L) 3.5 - 5.0 g/dL   AST 61 (H) 15 - 41 U/L   ALT 32 17 - 63 U/L   Alkaline Phosphatase 93 38 - 126 U/L   Total Bilirubin  0.2 (L) 0.3 - 1.2 mg/dL   GFR calc non Af Amer 26 (L) >60 mL/min   GFR calc Af Amer 30 (L) >60 mL/min   Anion gap 9 5 - 15  Lipase, blood  Result Value Ref Range   Lipase 27 11 - 51 U/L  Troponin I  Result Value Ref Range   Troponin I 0.03 (HH) <0.03 ng/mL  CBC with Differential  Result Value Ref Range   WBC 7.6 4.0 - 10.5 K/uL   RBC 4.55 4.22 - 5.81 MIL/uL   Hemoglobin 12.0 (L) 13.0 - 17.0 g/dL   HCT 37.3 (L) 39.0 - 52.0 %   MCV 82.0 78.0 - 100.0 fL   MCH 26.4 26.0 - 34.0 pg   MCHC 32.2 30.0 - 36.0 g/dL   RDW 16.5 (H) 11.5 - 15.5 %   Platelets 411 (H) 150 - 400 K/uL   Neutrophils Relative % 77 %   Neutro Abs 5.9 1.7 - 7.7 K/uL   Lymphocytes Relative 16 %   Lymphs Abs 1.2 0.7 - 4.0 K/uL   Monocytes Relative 5 %   Monocytes Absolute 0.4 0.1 - 1.0 K/uL   Eosinophils Relative 2 %   Eosinophils Absolute 0.2 0.0 - 0.7 K/uL   Basophils Relative 0 %   Basophils Absolute 0.0 0.0 - 0.1 K/uL  Urinalysis, Routine w reflex microscopic  Result Value Ref Range   Color, Urine YELLOW YELLOW   APPearance HAZY (A) CLEAR   Specific Gravity, Urine 1.010 1.005 - 1.030   pH 7.0 5.0 - 8.0   Glucose, UA NEGATIVE NEGATIVE mg/dL   Hgb urine dipstick MODERATE (A) NEGATIVE   Bilirubin Urine NEGATIVE NEGATIVE   Ketones, ur NEGATIVE NEGATIVE mg/dL   Protein, ur 30 (A) NEGATIVE mg/dL   Nitrite NEGATIVE NEGATIVE   Leukocytes, UA LARGE (A) NEGATIVE  Urinalysis, Microscopic (reflex)  Result Value Ref Range   RBC / HPF 0-5 0 - 5 RBC/hpf   WBC, UA TOO NUMEROUS TO COUNT 0 - 5 WBC/hpf   Bacteria, UA MANY (A) NONE SEEN   Squamous Epithelial / LPF 0-5 (A) NONE SEEN  I-Stat CG4 Lactic Acid, ED  Result Value Ref Range   Lactic Acid, Venous  1.61 0.5 - 1.9 mmol/L   Dg Chest 1 View Result Date: 02/16/2017 CLINICAL DATA:  Pain following fall EXAM: CHEST 1 VIEW COMPARISON:  None. FINDINGS: Lungs are clear. Heart size and pulmonary vascularity are normal. No adenopathy. No pneumothorax. No bone lesions.  IMPRESSION: No edema or consolidation. Electronically Signed   By: Lowella Grip III M.D.   On: 02/16/2017 15:07   Dg Ankle Complete Right Result Date: 02/16/2017 CLINICAL DATA:  Pain following fall EXAM: RIGHT ANKLE - COMPLETE 3+ VIEW COMPARISON:  None. FINDINGS: Frontal, oblique, and lateral views were obtained. There is mild soft tissue swelling. No fracture or joint effusion. The ankle mortise appears intact. No erosive change. IMPRESSION: Soft tissue swelling. No demonstrable fracture or appreciable joint space narrowing. Ankle mortise appears intact. Electronically Signed   By: Lowella Grip III M.D.   On: 02/16/2017 15:10   Ct Head Wo Contrast Result Date: 02/16/2017 CLINICAL DATA:  Possible syncope today. The patient fell out of a wheelchair. EXAM: CT HEAD WITHOUT CONTRAST CT CERVICAL SPINE WITHOUT CONTRAST TECHNIQUE: Multidetector CT imaging of the head and cervical spine was performed following the standard protocol without intravenous contrast. Multiplanar CT image reconstructions of the cervical spine were also generated. COMPARISON:  None. FINDINGS: CT HEAD FINDINGS Brain: Remote left MCA territory infarct is identified. No acute abnormality including hemorrhage, infarct, mass lesion, mass effect, midline shift or abnormal extra-axial fluid collection. No hydrocephalus or pneumocephalus. Vascular: Mild atherosclerosis is identified. Skull: Intact. Sinuses/Orbits: There is marked mucosal thickening with calcifications and sclerosis of the walls of the maxillary sinuses. Scattered ethmoid air cell disease is identified. Other: None. CT CERVICAL SPINE FINDINGS Alignment: The neck is in flexion.  No listhesis is identified. Skull base and vertebrae: No acute fracture. No primary bone lesion or focal pathologic process. Soft tissues and spinal canal: No prevertebral fluid or swelling. No visible canal hematoma. Disc levels:  Mild endplate spurring is seen at C5-6 and C6-7. Upper chest: Lung  apices are clear. Other: None. IMPRESSION: No acute abnormality head or cervical spine. Remote left MCA infarct and chronic microvascular ischemic change. Chronic bilateral maxillary sinus disease. Mild degenerative disc disease C5-6 and C6-7. Electronically Signed   By: Inge Rise M.D.   On: 02/16/2017 14:41   Ct Cervical Spine Wo Contrast Result Date: 02/16/2017 CLINICAL DATA:  Possible syncope today. The patient fell out of a wheelchair. EXAM: CT HEAD WITHOUT CONTRAST CT CERVICAL SPINE WITHOUT CONTRAST TECHNIQUE: Multidetector CT imaging of the head and cervical spine was performed following the standard protocol without intravenous contrast. Multiplanar CT image reconstructions of the cervical spine were also generated. COMPARISON:  None. FINDINGS: CT HEAD FINDINGS Brain: Remote left MCA territory infarct is identified. No acute abnormality including hemorrhage, infarct, mass lesion, mass effect, midline shift or abnormal extra-axial fluid collection. No hydrocephalus or pneumocephalus. Vascular: Mild atherosclerosis is identified. Skull: Intact. Sinuses/Orbits: There is marked mucosal thickening with calcifications and sclerosis of the walls of the maxillary sinuses. Scattered ethmoid air cell disease is identified. Other: None. CT CERVICAL SPINE FINDINGS Alignment: The neck is in flexion.  No listhesis is identified. Skull base and vertebrae: No acute fracture. No primary bone lesion or focal pathologic process. Soft tissues and spinal canal: No prevertebral fluid or swelling. No visible canal hematoma. Disc levels:  Mild endplate spurring is seen at C5-6 and C6-7. Upper chest: Lung apices are clear. Other: None. IMPRESSION: No acute abnormality head or cervical spine. Remote left MCA infarct and chronic microvascular ischemic  change. Chronic bilateral maxillary sinus disease. Mild degenerative disc disease C5-6 and C6-7. Electronically Signed   By: Inge Rise M.D.   On: 02/16/2017 14:41   Dg  Knee Complete 4 Views Right Result Date: 02/16/2017 CLINICAL DATA:  Pain following fall EXAM: RIGHT KNEE - COMPLETE 4+ VIEW COMPARISON:  None. FINDINGS: Frontal, lateral, and bilateral oblique views were obtained. There is soft tissue swelling. There is a moderate joint effusion. No fracture or dislocation. There is no appreciable joint space narrowing. IMPRESSION: Soft tissue swelling with moderate joint effusion. No acute fracture or dislocation. No appreciable arthropathic change. Electronically Signed   By: Lowella Grip III M.D.   On: 02/16/2017 15:09   Dg Hip Unilat With Pelvis 2-3 Views Right Result Date: 02/16/2017 CLINICAL DATA:  Pain following fall EXAM: DG HIP (WITH OR WITHOUT PELVIS) 2-3V RIGHT COMPARISON:  None. FINDINGS: Frontal pelvis as well as frontal and lateral right hip images were obtained. There is mild symmetric narrowing of both hip joints. There is no fracture or dislocation. No erosive change. There are foci of atherosclerotic calcification in the external iliac arteries bilaterally. IMPRESSION: Mild symmetric narrowing both hip joints. No fracture or dislocation. There is iliac artery atherosclerosis. Electronically Signed   By: Lowella Grip III M.D.   On: 02/16/2017 15:08    1615:  Pt unable to stand for orthostatic VS due to c/o increasing generalized weakness. IVF boluses given for hypotension. Unknown pt's baseline BP and BUN/Cr. +UTI, UC pending; will dose IV rocephin. T/C to [facility] Dr. Nehemiah Settle, case discussed, including:  HPI, pertinent PM/SHx, VS/PE, dx testing, ED course and treatment:  Agreeable to admit.    Final Clinical Impressions(s) / ED Diagnoses   Final diagnoses:  None    New Prescriptions New Prescriptions   No medications on file     Francine Graven, DO 02/18/17 1546

## 2017-02-16 NOTE — ED Notes (Signed)
PT unable to stand for ortho vital signs

## 2017-02-16 NOTE — Progress Notes (Signed)
Pharmacy Antibiotic Note  Brendan [name] is a 56 y.o. male admitted on 02/16/2017 with UTI.  Pharmacy has been consulted for Rocephin dosing.  Plan: Rocephin 2 GM IV x 1 dose, then Rocephin 1 GM IV every 24 hours Monitor labs, cultures F/U LOT  Height: 6\' 1"  (185.4 cm) IBW/kg (Calculated) : 79.9  Temp (24hrs), Avg:98 F (36.7 C), Min:98 F (36.7 C), Max:98 F (36.7 C)   Recent Labs Lab 02/16/17 1337 02/16/17 1348  WBC 7.6  --   CREATININE 2.59*  --   LATICACIDVEN  --  1.61    CrCl cannot be calculated (Unknown ideal weight.).    Allergies  Allergen Reactions  . Other     TB serum-reaction unknown. Provided via [facility] records     Antimicrobials this admission: Rocephin 3-16 >>      Dose adjustments this admission: NA    Thank you for allowing pharmacy to be a part of this patient's care.  Chriss Czar 02/16/2017 4:01 PM

## 2017-02-16 NOTE — ED Triage Notes (Signed)
Pt from [facility] after a fall. Pt states he might have passed out. Pt was being pushed outside in a wheelchair, the patients leg got caught underneath the wheel chair and he fell out of it. Pt says pain to right leg and knee. Hx of stroke with right sided deficit and patient has a urostomy.

## 2017-02-17 LAB — CBC
HCT: 35.3 % — ABNORMAL LOW (ref 39.0–52.0)
HEMOGLOBIN: 11.3 g/dL — AB (ref 13.0–17.0)
MCH: 26.2 pg (ref 26.0–34.0)
MCHC: 32 g/dL (ref 30.0–36.0)
MCV: 81.7 fL (ref 78.0–100.0)
Platelets: 366 10*3/uL (ref 150–400)
RBC: 4.32 MIL/uL (ref 4.22–5.81)
RDW: 16.2 % — ABNORMAL HIGH (ref 11.5–15.5)
WBC: 5.2 10*3/uL (ref 4.0–10.5)

## 2017-02-17 LAB — GLUCOSE, CAPILLARY
GLUCOSE-CAPILLARY: 106 mg/dL — AB (ref 65–99)
GLUCOSE-CAPILLARY: 118 mg/dL — AB (ref 65–99)
GLUCOSE-CAPILLARY: 81 mg/dL (ref 65–99)
Glucose-Capillary: 76 mg/dL (ref 65–99)

## 2017-02-17 LAB — BASIC METABOLIC PANEL
Anion gap: 7 (ref 5–15)
BUN: 36 mg/dL — AB (ref 6–20)
CALCIUM: 9.2 mg/dL (ref 8.9–10.3)
CO2: 28 mmol/L (ref 22–32)
Chloride: 104 mmol/L (ref 101–111)
Creatinine, Ser: 2.33 mg/dL — ABNORMAL HIGH (ref 0.61–1.24)
GFR calc Af Amer: 34 mL/min — ABNORMAL LOW (ref >60)
GFR, EST NON AFRICAN AMERICAN: 29 mL/min — AB (ref >60)
GLUCOSE: 79 mg/dL (ref 65–99)
POTASSIUM: 4 mmol/L (ref 3.5–5.1)
SODIUM: 139 mmol/L (ref 135–145)

## 2017-02-17 LAB — MRSA PCR SCREENING: MRSA by PCR: NEGATIVE

## 2017-02-17 MED ORDER — DEXTROSE 5 % IV SOLN
2.0000 g | INTRAVENOUS | Status: DC
  Administered 2017-02-17 – 2017-02-19 (×3): 2 g via INTRAVENOUS
  Filled 2017-02-17 (×4): qty 2

## 2017-02-17 NOTE — Progress Notes (Signed)
Pharmacy Antibiotic Note  Brendan [name] is a 56 y.o. male admitted on 02/16/2017 with UTI.  Pharmacy has been consulted for Rocephin dosing. Pyelonephritis   Plan: Change Rocephin to 2 GM IV every 24 hours Monitor labs, cultures F/U LOT  Height: 6\' 1"  (185.4 cm) Weight: 204 lb 12.9 oz (92.9 kg) IBW/kg (Calculated) : 79.9  Temp (24hrs), Avg:97.8 F (36.6 C), Min:97.4 F (36.3 C), Max:98 F (36.7 C)   Recent Labs Lab 02/16/17 1337 02/16/17 1348 02/17/17 0630  WBC 7.6  --  5.2  CREATININE 2.59*  --  2.33*  LATICACIDVEN  --  1.61  --     Estimated Creatinine Clearance: 39.5 mL/min (A) (by C-G formula based on SCr of 2.33 mg/dL (H)).    Allergies  Allergen Reactions  . Other     TB serum-reaction unknown. Provided via [facility] records     Antimicrobials this admission: Rocephin 3-16 >>           Thank you for allowing pharmacy to be a part of this patient's care.  Chriss Czar 02/17/2017 9:46 AM

## 2017-02-17 NOTE — Progress Notes (Signed)
PROGRESS NOTE  Brendan [name] ENI:[id number] DOB: December 15, 1960 DOA: 02/16/2017 PCP: Cleda Mccreedy, MD  Brief Narrative: 56 year old man PMH obstructive uropathy, status post left-sided nephrostomy tube, retroperitoneal fibrosis with ureteral stricture, stroke with residual right-sided hemiplegia, recent UTI who was being transported from skilled rehabilitation facility to [facility] for nephrostomy tube replacement but during transport he was attempting to stand when lower leg caught and he fell and hit his head. Not clear whether he had loss of consciousness or not. Workup in the emergency department suggested UTI and he was admitted for pyelonephritis.  Assessment/Plan 1. Pyelonephritis. Afebrile. Appears nontoxic.  Follow-up culture data, continue empiric antibiotics. 2. AKI vs chronic kidney disease stage IV? No previous laboratory studies for comparison. 3. Closed head injury status post fall. CT head and neck no acute disease.   Monitor clinically. 4. Diabetes mellitus. Blood sugars stable. Diet-controlled? Continue sliding scale insulin 5. Retroperitoneal fibrosis. CellCept on hold secondary to recurrent UTI.   Nephrostomy tube due to be replaced after acute infection has been cleared. 6. PMH stroke with residual right hemiplegia   DVT prophylaxis: enoxaparin Code Status: full code Family Communication: none Disposition Plan: return to SNF    Murray Hodgkins, MD  [facility] Direct contact: [phone] --[place] [place]  --[email or web address]; password [email or web address]  7PM-7AM contact night coverage as above 02/17/2017, 12:24 PM  LOS: 1 day   Consultants:    Procedures:    Antimicrobials:  Ceftriaxone 3/16 >>  Interval history/Subjective: Feels okay. Poor appetite. No flank pain.  Objective: Vitals:   02/16/17 1800 02/16/17 1830 02/16/17 2000 02/17/17 0509  BP: 99/70 104/82 97/75 100/70  Pulse: 89 88 87 82  Resp: 11 14 18 18   Temp: 98 F (36.7 C) 98 F (36.7 C)  97.8 F (36.6 C) 97.4 F (36.3 C)  TempSrc:   Oral Oral  SpO2: 100% 99% 100% 100%  Weight:   92.9 kg (204 lb 12.9 oz)   Height:   6\' 1"  (1.854 m)     Intake/Output Summary (Last 24 hours) at 02/17/17 1224 Last data filed at 02/17/17 0510  Gross per 24 hour  Intake             1250 ml  Output              250 ml  Net             1000 ml     Filed Weights   02/16/17 2000  Weight: 92.9 kg (204 lb 12.9 oz)    Exam:    Constitutional: Appears chronically ill, nontoxic. Appears relatively comfortable.  Respiratory: Clear to auscultation bilaterally. No wheezes, rales or rhonchi. Normal respiratory effort.  Cardiovascular: regular rate and rhythm. No murmur, rub or gallop. No lower extremity edema noted.  Abdomen: soft.  Psychiatric: Grossly normal mood and affect. Speech fluent and appropriate.  I have personally reviewed following labs and imaging studies:  Blood sugars stable.  BUN slightly improved, 36, creatinine slightly improved 2.33, potassium normal.  CBC unremarkable  Scheduled Meds: . aspirin  81 mg Oral Daily  . cefTRIAXone (ROCEPHIN)  IV  2 g Intravenous Q24H  . dronabinol  2.5 mg Oral QHS  . enoxaparin (LOVENOX) injection  40 mg Subcutaneous Q24H  . ferrous sulfate  325 mg Oral Q breakfast  . insulin aspart  0-15 Units Subcutaneous TID WC  . insulin aspart  0-5 Units Subcutaneous QHS  . polyethylene glycol  17 g Oral Daily  . simvastatin  40  mg Oral QHS  . sodium bicarbonate  650 mg Oral Q12H  . tamsulosin  0.4 mg Oral QHS   Continuous Infusions: . sodium chloride Stopped (02/16/17 1600)    Principal Problem:   Pyelonephritis Active Problems:   Coronary atherosclerosis of native coronary artery   Diabetes mellitus without complication ([facility])   Chronic kidney disease, stage IV (severe) ([facility])   Hemiparesis affecting right side as late effect of cerebrovascular accident ([place])   Retroperitoneal fibrosis   LOS: 1 day

## 2017-02-18 ENCOUNTER — Encounter (HOSPITAL_COMMUNITY): Payer: Self-pay

## 2017-02-18 DIAGNOSIS — N179 Acute kidney failure, unspecified: Secondary | ICD-10-CM

## 2017-02-18 LAB — BASIC METABOLIC PANEL
Anion gap: 8 (ref 5–15)
BUN: 30 mg/dL — AB (ref 6–20)
CALCIUM: 8.8 mg/dL — AB (ref 8.9–10.3)
CHLORIDE: 106 mmol/L (ref 101–111)
CO2: 25 mmol/L (ref 22–32)
CREATININE: 2.26 mg/dL — AB (ref 0.61–1.24)
GFR calc non Af Amer: 30 mL/min — ABNORMAL LOW (ref >60)
GFR, EST AFRICAN AMERICAN: 35 mL/min — AB (ref >60)
GLUCOSE: 84 mg/dL (ref 65–99)
Potassium: 4.2 mmol/L (ref 3.5–5.1)
Sodium: 139 mmol/L (ref 135–145)

## 2017-02-18 LAB — GLUCOSE, CAPILLARY
GLUCOSE-CAPILLARY: 87 mg/dL (ref 65–99)
GLUCOSE-CAPILLARY: 83 mg/dL (ref 65–99)
Glucose-Capillary: 87 mg/dL (ref 65–99)
Glucose-Capillary: 82 mg/dL (ref 65–99)

## 2017-02-18 LAB — URINE CULTURE: CULTURE: NO GROWTH

## 2017-02-18 LAB — HIV ANTIBODY (ROUTINE TESTING W REFLEX): HIV Screen 4th Generation wRfx: NONREACTIVE

## 2017-02-18 NOTE — Progress Notes (Signed)
PROGRESS NOTE  Brendan [name] DJM:[id number] DOB: May 06, 1961 DOA: 02/16/2017 PCP: Cleda Mccreedy, MD  Brief Narrative: 56 year old man PMH obstructive uropathy, status post left-sided nephrostomy tube, retroperitoneal fibrosis with ureteral stricture, stroke with residual right-sided hemiplegia, recent UTI who was being transported from skilled rehabilitation facility to [facility] for nephrostomy tube replacement but during transport he was attempting to stand when lower leg caught and he fell and hit his head. Not clear whether he had loss of consciousness or not. Workup in the emergency department suggested UTI and he was admitted for pyelonephritis.  Assessment/Plan 1. Pyelonephritis. Afebrile. Appears nontoxic.  Continue empiric antibiotics although culture was negative. Uncertain whether the patient was receiving treatment as an outpatient. 2. AKI vs chronic kidney disease stage IV? No previous laboratory studies for comparison. Renal function improving with IV fluids. 3. Closed head injury status post fall. CT head and neck no acute disease.   Continue to monitor clinically. Not on anticoagulants as an outpatient. 4. Diabetes mellitus. Blood sugars remain stable. Apparently diet controlled. 5. Retroperitoneal fibrosis. CellCept on hold secondary to recurrent UTI.   Nephrostomy tube due to be replaced after acute infection has been cleared. 6. PMH stroke with residual right hemiplegia  History improving. Anticipate discharge in the next 48 hours.  DVT prophylaxis: enoxaparin Code Status: full code Family Communication: none Disposition Plan: return to SNF    Murray Hodgkins, MD  [facility] Direct contact: [phone] --Via amion app OR  --[email or web address]; password [email or web address]  7PM-7AM contact night coverage as above 02/18/2017, 11:57 AM  LOS: 2 days   Consultants:    Procedures:    Antimicrobials:  Ceftriaxone 3/16 >>  Interval history/Subjective: No issues  overnight. No pain. Breathing okay.  Objective: Vitals:   02/17/17 0509 02/17/17 1457 02/17/17 2338 02/18/17 0645  BP: 100/70 98/77 104/74 110/75  Pulse: 82 77 91 89  Resp: 18 18    Temp: 97.4 F (36.3 C) 97.6 F (36.4 C) 98.2 F (36.8 C) 98 F (36.7 C)  TempSrc: Oral Oral    SpO2: 100% 100% 99% 100%  Weight:      Height:        Intake/Output Summary (Last 24 hours) at 02/18/17 1157 Last data filed at 02/18/17 0800  Gross per 24 hour  Intake             1180 ml  Output              500 ml  Net              680 ml     Filed Weights   02/16/17 2000  Weight: 92.9 kg (204 lb 12.9 oz)    Exam:    Constitutional: Appears calm, comfortable. Lying flat in bed. Respiratory. Clear to auscultation bilaterally. No wheezes, rales or rhonchi. Normal respiratory effort. Cardiovascular. Regular rate and rhythm. No murmur, rub or gallop. Abdomen soft, nontender.  I have personally reviewed following labs and imaging studies:  Blood sugars remain stable  BUN and creatinine trending downwards.  Scheduled Meds: . aspirin  81 mg Oral Daily  . cefTRIAXone (ROCEPHIN)  IV  2 g Intravenous Q24H  . dronabinol  2.5 mg Oral QHS  . enoxaparin (LOVENOX) injection  40 mg Subcutaneous Q24H  . ferrous sulfate  325 mg Oral Q breakfast  . insulin aspart  0-15 Units Subcutaneous TID WC  . insulin aspart  0-5 Units Subcutaneous QHS  . polyethylene glycol  17 g Oral Daily  . simvastatin  40 mg Oral QHS  . sodium bicarbonate  650 mg Oral Q12H  . tamsulosin  0.4 mg Oral QHS   Continuous Infusions: . sodium chloride 100 mL/hr at 02/17/17 2242    Principal Problem:   Pyelonephritis Active Problems:   Coronary atherosclerosis of native coronary artery   Diabetes mellitus without complication (HCC)   Chronic kidney disease, stage IV (severe) (HCC)   Hemiparesis affecting right side as late effect of cerebrovascular accident ([place])   Retroperitoneal fibrosis   LOS: 2 days

## 2017-02-19 LAB — GLUCOSE, CAPILLARY
GLUCOSE-CAPILLARY: 82 mg/dL (ref 65–99)
Glucose-Capillary: 74 mg/dL (ref 65–99)
Glucose-Capillary: 105 mg/dL — ABNORMAL HIGH (ref 65–99)
Glucose-Capillary: 90 mg/dL (ref 65–99)

## 2017-02-19 LAB — BASIC METABOLIC PANEL
Anion gap: 7 (ref 5–15)
BUN: 25 mg/dL — ABNORMAL HIGH (ref 6–20)
CHLORIDE: 107 mmol/L (ref 101–111)
CO2: 25 mmol/L (ref 22–32)
Calcium: 8.6 mg/dL — ABNORMAL LOW (ref 8.9–10.3)
Creatinine, Ser: 2.12 mg/dL — ABNORMAL HIGH (ref 0.61–1.24)
GFR calc Af Amer: 38 mL/min — ABNORMAL LOW (ref >60)
GFR calc non Af Amer: 33 mL/min — ABNORMAL LOW (ref >60)
Glucose, Bld: 84 mg/dL (ref 65–99)
POTASSIUM: 3.9 mmol/L (ref 3.5–5.1)
SODIUM: 139 mmol/L (ref 135–145)

## 2017-02-19 NOTE — Progress Notes (Signed)
PROGRESS NOTE  Brendan [name] TMH:[id number] DOB: 07/29/1961 DOA: 02/16/2017 PCP: Cleda Mccreedy, MD  Brief Narrative: 56 year old man PMH obstructive uropathy, status post left-sided nephrostomy tube, retroperitoneal fibrosis with ureteral stricture, stroke with residual right-sided hemiplegia, recent UTI who was being transported from skilled rehabilitation facility to [facility] for nephrostomy tube replacement but during transport he was attempting to stand when lower leg caught and he fell and hit his head. Not clear whether he had loss of consciousness or not. Workup in the emergency department suggested UTI and he was admitted for pyelonephritis.  Assessment/Plan 1. AKI vs chronic kidney disease stage IV? Renal function continues to improve. Unclear where the patient has previously received treatment, perhaps [place]?  Continue IV fluids 2. Pyelonephritis. Afebrile. Appears nontoxic.  Continues to improve. Culture was negative but he may of been receiving antibiotics as an outpatient. 3. Closed head injury status post fall. CT head and neck no acute disease.   No sequela noted. Not on anticoagulants. 4. Diabetes mellitus. Blood sugars well controlled. Not on any medications. 5. Retroperitoneal fibrosis. CellCept on hold secondary to recurrent UTI.   Nephrostomy tube due to be replaced after acute infection has been cleared. 6. PMH stroke with residual right hemiplegia  Continues to improve. Anticipate discharge 3/20.  DVT prophylaxis: enoxaparin Code Status: full code Family Communication: none Disposition Plan: return to SNF    Murray Hodgkins, MD  [facility] Direct contact: [phone] --Via amion app OR  --[email or web address]; password [email or web address]  7PM-7AM contact night coverage as above 02/19/2017, 1:33 PM  LOS: 3 days   Consultants:    Procedures:    Antimicrobials:  Ceftriaxone 3/16 >>  Interval history/Subjective: No issues overnight other than intermittent  nausea. Eating okay breathing okay. No pain.  Objective: Vitals:   02/18/17 0645 02/18/17 1450 02/18/17 2140 02/19/17 0642  BP: 110/75 108/68 112/76 119/77  Pulse: 89 87 86 75  Resp:  20 20 20   Temp: 98 F (36.7 C) 98.1 F (36.7 C) 98.5 F (36.9 C) 98 F (36.7 C)  TempSrc:  Oral Oral Oral  SpO2: 100% 99% 100% 98%  Weight:      Height:        Intake/Output Summary (Last 24 hours) at 02/19/17 1333 Last data filed at 02/19/17 0913  Gross per 24 hour  Intake          2577.08 ml  Output              650 ml  Net          1927.08 ml     Filed Weights   02/16/17 2000  Weight: 92.9 kg (204 lb 12.9 oz)    Exam: Constitutional. Appears comfortable and calm. Cardiovascular. Regular rate and rhythm. No murmur, rub or gallop. No significant lower extremity edema. Respiratory. Clear to auscultation bilaterally. No wheezes, rales or rhonchi. Normal respiratory effort. Psychiatric. Grossly normal mood and affect. Speech fluent and appropriate.  I have personally reviewed following labs and imaging studies:  Blood sugars less than 100. No hypoglycemia.  BUN and creatinine continue to trend downwards. 2.12.  Scheduled Meds: . aspirin  81 mg Oral Daily  . cefTRIAXone (ROCEPHIN)  IV  2 g Intravenous Q24H  . dronabinol  2.5 mg Oral QHS  . enoxaparin (LOVENOX) injection  40 mg Subcutaneous Q24H  . ferrous sulfate  325 mg Oral Q breakfast  . insulin aspart  0-15 Units Subcutaneous TID WC  . insulin aspart  0-5 Units Subcutaneous QHS  .  polyethylene glycol  17 g Oral Daily  . simvastatin  40 mg Oral QHS  . sodium bicarbonate  650 mg Oral Q12H  . tamsulosin  0.4 mg Oral QHS   Continuous Infusions: . sodium chloride 75 mL/hr at 02/19/17 0247    Principal Problem:   Pyelonephritis Active Problems:   Coronary atherosclerosis of native coronary artery   Diabetes mellitus without complication (HCC)   Chronic kidney disease, stage IV (severe) (HCC)   Hemiparesis affecting right  side as late effect of cerebrovascular accident ([place])   Retroperitoneal fibrosis   LOS: 3 days

## 2017-02-19 NOTE — Clinical Social Work Note (Signed)
Clinical Social Work Assessment  Patient Details  Name: Brendan [name] MRN: [id number] Date of Birth: 06/23/1961  Date of referral:  02/19/17               Reason for consult:  Discharge Planning                Permission sought to share information with:    Permission granted to share information::     Name::        Agency::     Relationship::     Contact Information:     Housing/Transportation Living arrangements for the past 2 months:  [place] of Information:  Facility Patient Interpreter Needed:  None Criminal Activity/Legal Involvement Pertinent to Current Situation/Hospitalization:    Significant Relationships:  Siblings, Parents Lives with:  Facility Resident Do you feel safe going back to the place where you live?  Yes Need for family participation in patient care:  Yes (Comment)  Care giving concerns:  Facility resident.    Social Worker assessment / plan:  LCSW spoke with Lynnae Sandhoff at [facility]. Patient uses a wheelchair, receives extensive assistance from ADLs, and is able to feed himself. Patient is his own responsible person, however his sister is important.  Patient was admitted 10/24/16 as a compassion release from [facility] due to his stroke. Patient can return to the facility at discharge.  LCSW attempted to assess patient, however he was asleep.    Employment status:  Disabled (Comment on whether or not currently receiving Disability) Insurance information:  Medicaid In [place] PT Recommendations:    Information / Referral to community resources:     Patient/Family's Response to care:  LCSW will assess once patient is awake.  Patient/Family's Understanding of and Emotional Response to Diagnosis, Current Treatment, and Prognosis: LCSW will assess once patient is awake.   Emotional Assessment Appearance:  Appears stated age Attitude/Demeanor/Rapport:  Unable to Assess (Patient was asleep.) Affect (typically observed):  Unable to  Assess (Patient was asleep.) Orientation:  Oriented to Self, Oriented to Place, Oriented to  Time, Oriented to Situation Alcohol / Substance use:  Not Applicable Psych involvement (Current and /or in the community):  No (Comment)  Discharge Needs  Concerns to be addressed:  No discharge needs identified Readmission within the last 30 days:  No Current discharge risk:  None Barriers to Discharge:  No Barriers Identified   Ihor Gully, LCSW 02/19/2017, 2:33 PM

## 2017-02-19 NOTE — Care Management Note (Signed)
Case Management Note  Patient Details  Name: Brendan [name] MRN: [id number] Date of Birth: [birth year]  Subjective/Objective:                  Admitted with pyelonephritis. Pt from [facility] as a long term resident.   Action/Plan: Plan for return to [place] at [place]. CSW aware and will make arrangements for return to facility at DC.   Expected Discharge Date:    02/20/2017              Expected Discharge Plan:  [place]  In-House Referral:  Clinical Social Work  Discharge planning Services  CM Consult  Post Acute Care Choice:  NA Choice offered to:  NA  Status of Service:  Completed, signed off  Sherald Barge, RN 02/19/2017, 2:04 PM

## 2017-02-19 NOTE — Clinical Social Work Note (Signed)
LCSW left a message for patient's sister requesting return contact.  LCSW left a message for [employer], [place], requesting return contact.        Alailah Safley, Clydene Pugh, LCSW

## 2017-02-19 NOTE — Progress Notes (Signed)
Pharmacy Antibiotic Note  Brendan [name] is a 56 y.o. male admitted on 02/16/2017 with UTI.  Pharmacy has been consulted for Rocephin dosing.  Plan: Continue Rocephin 2 GM IV every 24 hours Monitor labs, cultures F/U duration of therapy (per MD)  Height: 6\' 1"  (185.4 cm) Weight: 204 lb 12.9 oz (92.9 kg) IBW/kg (Calculated) : 79.9  Temp (24hrs), Avg:98.2 F (36.8 C), Min:98 F (36.7 C), Max:98.5 F (36.9 C)   Recent Labs Lab 02/16/17 1337 02/16/17 1348 02/17/17 0630 02/18/17 0715 02/19/17 0645  WBC 7.6  --  5.2  --   --   CREATININE 2.59*  --  2.33* 2.26* 2.12*  LATICACIDVEN  --  1.61  --   --   --     Estimated Creatinine Clearance: 43.4 mL/min (A) (by C-G formula based on SCr of 2.12 mg/dL (H)).    Allergies  Allergen Reactions  . Other     TB serum-reaction unknown. Provided via [facility] records    Antimicrobials this admission: Rocephin 3-16 >>   UCx = No Growth  Dose adjustments this admission: NA  Thank you for allowing pharmacy to be a part of this patient's care.  Hart Robinsons A 02/19/2017 10:11 AM

## 2017-02-20 ENCOUNTER — Encounter (HOSPITAL_COMMUNITY): Payer: Self-pay | Admitting: *Deleted

## 2017-02-20 ENCOUNTER — Ambulatory Visit: Payer: Medicaid Other | Admitting: Urology

## 2017-02-20 DIAGNOSIS — N189 Chronic kidney disease, unspecified: Secondary | ICD-10-CM

## 2017-02-20 LAB — BASIC METABOLIC PANEL
Anion gap: 5 (ref 5–15)
BUN: 20 mg/dL (ref 6–20)
CO2: 24 mmol/L (ref 22–32)
Calcium: 8.6 mg/dL — ABNORMAL LOW (ref 8.9–10.3)
Chloride: 110 mmol/L (ref 101–111)
Creatinine, Ser: 1.92 mg/dL — ABNORMAL HIGH (ref 0.61–1.24)
GFR calc Af Amer: 43 mL/min — ABNORMAL LOW (ref >60)
GFR, EST NON AFRICAN AMERICAN: 37 mL/min — AB (ref >60)
Glucose, Bld: 100 mg/dL — ABNORMAL HIGH (ref 65–99)
POTASSIUM: 4.1 mmol/L (ref 3.5–5.1)
SODIUM: 139 mmol/L (ref 135–145)

## 2017-02-20 LAB — GLUCOSE, CAPILLARY
GLUCOSE-CAPILLARY: 101 mg/dL — AB (ref 65–99)
GLUCOSE-CAPILLARY: 79 mg/dL (ref 65–99)
Glucose-Capillary: 83 mg/dL (ref 65–99)

## 2017-02-20 MED ORDER — CEFUROXIME AXETIL 500 MG PO TABS: 500.0000 mg | ORAL_TABLET | Freq: Two times a day (BID) | ORAL | Status: DC

## 2017-02-20 NOTE — NC FL2 (Signed)
[place] LEVEL OF CARE SCREENING TOOL     IDENTIFICATION  [name] Birthdate: 1961/09/26 Sex: male Admission Date (Current Location): 02/16/2017  [place] and [place] Number:  [facility] [phone] [place] and Address:  [place] [address] [place]      Provider Number: [id number]  Attending Physician Name and Address:  Samuella Cota, MD  Relative Name and Phone Number:       Current Level of Care: Hospital Recommended Level of Care: [place] Prior Approval Number:    Date Approved/Denied:   PASRR Number:    Discharge Plan: SNF    Current Diagnoses: Patient Active Problem List   Diagnosis Date Noted  . Pyelonephritis 02/16/2017  . 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 Coronary atherosclerosis of native coronary artery   . Hypertension   . Stroke ([place])   . Diabetes mellitus without complication ([place])   . Chronic kidney disease, stage IV (severe) ([place])   . GERD (gastroesophageal reflux disease)   . Glaucoma   . Hemiparesis affecting right side as late effect of cerebrovascular accident ([place])   . Enlarged prostate with urinary obstruction   . Constipation     Orientation RESPIRATION BLADDER Height & Weight     Self, Time, Situation, Place  Normal  (Patient has Nephrostomy ) Weight: 204 lb 12.9 oz (92.9 kg) Height:  6\' 1"  (185.4 cm)  BEHAVIORAL SYMPTOMS/MOOD NEUROLOGICAL BOWEL NUTRITION STATUS      Incontinent  (Heart healthy/carb modified. )  AMBULATORY STATUS COMMUNICATION OF NEEDS Skin   Independent Verbally Normal                       Personal Care Assistance Level of Assistance  Bathing, Feeding, Dressing Bathing Assistance: Maximum assistance Feeding assistance: Independent Dressing Assistance: Maximum assistance     Functional Limitations Info  Sight, Hearing, Speech Sight Info: Adequate Hearing Info: Adequate Speech Info: Adequate    SPECIAL CARE FACTORS  FREQUENCY                       Contractures Contractures Info: Not present    Additional Factors Info  Code Status, Allergies Code Status Info: Full Allergies Info: Other           Current Medications (02/20/2017):  This is the current hospital active medication list Current Facility-Administered Medications  Medication Dose Route Frequency Provider Last Rate Last Dose  . 0.9 %  sodium chloride infusion   Intravenous Continuous Samuella Cota, MD 75 mL/hr at 02/19/17 0247    . acetaminophen (TYLENOL) tablet 650 mg  650 mg Oral Q6H PRN Truett Mainland, DO       Or  . acetaminophen (TYLENOL) suppository 650 mg  650 mg Rectal Q6H PRN Tanna Savoy Stinson, DO      . aspirin chewable tablet 81 mg  81 mg Oral Daily Tanna Savoy Stinson, DO   81 mg at 02/20/17 0847  . cefTRIAXone (ROCEPHIN) 2 g in dextrose 5 % 50 mL IVPB  2 g Intravenous Q24H Samuella Cota, MD   2 g at 02/19/17 1637  . dronabinol (MARINOL) capsule 2.5 mg  2.5 mg Oral QHS Tanna Savoy Stinson, DO   2.5 mg at 02/16/17 2231  . enoxaparin (LOVENOX) injection 40 mg  40 mg Subcutaneous Q24H Tanna Savoy Stinson, DO   40 mg at 02/19/17 2118  . ferrous sulfate tablet 325 mg  325 mg  Oral Q breakfast Truett Mainland, DO   325 mg at 02/20/17 0847  . HYDROcodone-acetaminophen (NORCO/VICODIN) 5-325 MG per tablet 1 tablet  1 tablet Oral Q6H PRN Tanna Savoy Stinson, DO      . insulin aspart (novoLOG) injection 0-15 Units  0-15 Units Subcutaneous TID WC Tanna Savoy Stinson, DO      . insulin aspart (novoLOG) injection 0-5 Units  0-5 Units Subcutaneous QHS Tanna Savoy Stinson, DO      . ondansetron [facility]) tablet 4 mg  4 mg Oral Q6H PRN Tanna Savoy Stinson, DO       Or  . ondansetron [facility]) injection 4 mg  4 mg Intravenous Q6H PRN Tanna Savoy Stinson, DO      . polyethylene glycol (MIRALAX / GLYCOLAX) packet 17 g  17 g Oral Daily Tanna Savoy Stinson, DO   17 g at 02/20/17 0847  . polyethylene glycol (MIRALAX / GLYCOLAX) packet 17 g  17 g Oral Daily PRN Tanna Savoy  Stinson, DO      . simvastatin (ZOCOR) tablet 40 mg  40 mg Oral QHS Tanna Savoy Stinson, DO   40 mg at 02/19/17 2118  . sodium bicarbonate tablet 650 mg  650 mg Oral Q12H Tanna Savoy Stinson, DO   650 mg at 02/20/17 0847  . tamsulosin (FLOMAX) capsule 0.4 mg  0.4 mg Oral QHS Tanna Savoy Stinson, DO   0.4 mg at 02/19/17 2118     Discharge Medications: Please see discharge summary for a list of discharge medications.  Relevant Imaging Results:  Relevant Lab Results:   Additional Information    Tahiri Shareef, Clydene Pugh, LCSW

## 2017-02-20 NOTE — Discharge Summary (Signed)
Physician Discharge Summary  Brendan [name] ZOX:[id number] DOB: [birth year] DOA: 02/16/2017  PCP: Cleda Mccreedy, MD  Admit date: 02/16/2017 Discharge date: 02/20/2017  Recommendations for Outpatient Follow-up:  1. Acute kidney injury. Creatinine continues to improve. Baseline unknown. Patient on Lasix prior to admission. Lasix has been restarted at this time, please follow BMP closely to ensure creatinine returns to and remains at baseline. 2. Please reschedule nephrostomy tube procedure as per primary care physician and interventional radiology. 3. Cellcept on hold prior to admission, continues on hold as per report. It should be restarted by his primary care physician whenever clinically indicated.    Follow-up Information    Cleda Mccreedy, MD. Schedule an appointment as soon as possible for a visit in 1 week(s).   Specialty:  Internal Medicine Contact information: [place] [address] [phone]          Discharge Diagnoses:  1. Acute kidney injury 2. Pyelonephritis 3. Suspected chronic kidney disease of unknown stage 4. Closed head injury status post fall prior to admission with no apparent sequela 5. Diabetes mellitus type 2 by report, diet-controlled 6. Retroperitoneal fibrosis 7. Past medical history stroke with residual right hemiplegia  Discharge Condition: improved Disposition: return to SNF  Diet recommendation: Heart healthy, diabetic diet  Filed Weights   02/16/17 2000  Weight: 92.9 kg (204 lb 12.9 oz)    History of present illness:  56 year old man PMH obstructive uropathy, status post left-sided nephrostomy tube, retroperitoneal fibrosis with ureteral stricture, stroke with residual right-sided hemiplegia, recent UTI who was being transported from skilled rehabilitation facility to [facility] for nephrostomy tube replacement but during transport he was attempting to stand when lower leg caught and he fell and hit his head. Not clear  whether he had loss of consciousness or not. Workup in the emergency department suggested UTI and he was admitted for pyelonephritis.Patient has not required any narcotics during this admission. No evidence of withdrawal. Therefore narcotics discontinued.  Hospital Course:  Patient was treated with empiric antibiotics with rapid clinical improvement. Urine culture is negative, however not clear whether the patient was receiving antibiotics, and infection was presumed. Acute kidney injury continues to improve with IV fluids, spontaneous resolution is now expected. Baseline renal function is unknown. Comorbidities remain stable. Hospitalization was uncomplicated. Individual issues as below.  1. Acute kidney injury, resolving with IV fluids. Spontaneous resolution is now expected. Baseline creatinine is unknown. 2. Pyelonephritis, clinically resolved. Urine culture was negative, although infection was suspected based on urinalysis. Not clear whether the patient was receiving antibiotics as an outpatient.  3. Closed head injury status post fall prior to admission. CT head and neck with no acute pathology noted. No apparent sequela. Not on anticoagulants. 4. Reported diabetes mellitus. Blood sugars well controlled. Apparently diet controlled. 5. Retroperitoneal fibrosis. Reportedly CellCept on hold secondary to recurrent UTI. Nephrostomy tube due to be replaced after acute infection has been cleared. This will be coordinated by the skilled nursing facility. 6. PMH stroke with residual right hemiplegia.  Antimicrobials:  Ceftriaxone 3/16 >> 3/19  Ceftin 3/20 >> 3/22  Today's assessment: S: feels well, no complaints O: Vitals:   02/19/17 2126 02/20/17 0537  BP: 119/88 100/71  Pulse: 97 77  Resp: 18 18  Temp: 97.6 F (36.4 C) 98 F (36.7 C)   Constitutional: Appears calm, comfortable, sitting in chair. Alert. Respiratory: Clear to auscultation bilaterally. No wheezes, rales or rhonchi. Normal  respiratory effort. Cardiovascular: Regular rate and rhythm. No murmur, rub  or gallop. No left lower extremity edema. 1+ right lower extremity edema. Psychiatric: Grossly normal mood and affect. Speech fluent and appropriate.   Discharge Instructions   Allergies as of 02/20/2017      Reactions   Other    TB serum-reaction unknown. Provided via [facility] records      Medication List    STOP taking these medications   HYDROcodone-acetaminophen 5-325 MG tablet Commonly known as:  NORCO/VICODIN   mycophenolate 500 MG tablet Commonly known as:  CELLCEPT     TAKE these medications   acetaminophen 325 MG tablet Commonly known as:  TYLENOL Take 650 mg by mouth 3 (three) times daily as needed for mild pain or moderate pain.   aspirin 81 MG chewable tablet Chew 81 mg by mouth daily.   CALTRATE 600 1500 (600 Ca) MG Tabs tablet Generic drug:  calcium carbonate Take 1 tablet by mouth 2 (two) times daily.   cefUROXime 500 MG tablet Commonly known as:  CEFTIN Take 1 tablet (500 mg total) by mouth 2 (two) times daily with a meal. Last dose 3/22 PM   cholecalciferol 400 units Tabs tablet Commonly known as:  VITAMIN D Take 400 Units by mouth daily.   dronabinol 2.5 MG capsule Commonly known as:  MARINOL Take 2.5 mg by mouth at bedtime.   ferrous sulfate 325 (65 FE) MG tablet Take 325 mg by mouth daily with breakfast.   furosemide 40 MG tablet Commonly known as:  LASIX Take 40 mg by mouth daily.   polyethylene glycol packet Commonly known as:  MIRALAX / GLYCOLAX Take 17 g by mouth daily.   PROBIOTIC PO Take 1 capsule by mouth daily.   simvastatin 40 MG tablet Commonly known as:  ZOCOR Take 40 mg by mouth at bedtime.   sodium bicarbonate 650 MG tablet Take 650 mg by mouth every 12 (twelve) hours.   tamsulosin 0.4 MG Caps capsule Commonly known as:  FLOMAX Take 0.4 mg by mouth at bedtime.      Allergies  Allergen Reactions  . Other     TB serum-reaction unknown.  Provided via [facility] records     The results of significant diagnostics from this hospitalization (including imaging, microbiology, ancillary and laboratory) are listed below for reference.    Significant Diagnostic Studies: Dg Chest 1 View  Result Date: 02/16/2017 CLINICAL DATA:  Pain following fall EXAM: CHEST 1 VIEW COMPARISON:  None. FINDINGS: Lungs are clear. Heart size and pulmonary vascularity are normal. No adenopathy. No pneumothorax. No bone lesions. IMPRESSION: No edema or consolidation. Electronically Signed   By: Lowella Grip III M.D.   On: 02/16/2017 15:07   Dg Ankle Complete Right  Result Date: 02/16/2017 CLINICAL DATA:  Pain following fall EXAM: RIGHT ANKLE - COMPLETE 3+ VIEW COMPARISON:  None. FINDINGS: Frontal, oblique, and lateral views were obtained. There is mild soft tissue swelling. No fracture or joint effusion. The ankle mortise appears intact. No erosive change. IMPRESSION: Soft tissue swelling. No demonstrable fracture or appreciable joint space narrowing. Ankle mortise appears intact. Electronically Signed   By: Lowella Grip III M.D.   On: 02/16/2017 15:10   Ct Head Wo Contrast  Result Date: 02/16/2017 CLINICAL DATA:  Possible syncope today. The patient fell out of a wheelchair. EXAM: CT HEAD WITHOUT CONTRAST CT CERVICAL SPINE WITHOUT CONTRAST TECHNIQUE: Multidetector CT imaging of the head and cervical spine was performed following the standard protocol without intravenous contrast. Multiplanar CT image reconstructions of the cervical spine were also generated.  COMPARISON:  None. FINDINGS: CT HEAD FINDINGS Brain: Remote left MCA territory infarct is identified. No acute abnormality including hemorrhage, infarct, mass lesion, mass effect, midline shift or abnormal extra-axial fluid collection. No hydrocephalus or pneumocephalus. Vascular: Mild atherosclerosis is identified. Skull: Intact. Sinuses/Orbits: There is marked mucosal thickening with calcifications and  sclerosis of the walls of the maxillary sinuses. Scattered ethmoid air cell disease is identified. Other: None. CT CERVICAL SPINE FINDINGS Alignment: The neck is in flexion.  No listhesis is identified. Skull base and vertebrae: No acute fracture. No primary bone lesion or focal pathologic process. Soft tissues and spinal canal: No prevertebral fluid or swelling. No visible canal hematoma. Disc levels:  Mild endplate spurring is seen at C5-6 and C6-7. Upper chest: Lung apices are clear. Other: None. IMPRESSION: No acute abnormality head or cervical spine. Remote left MCA infarct and chronic microvascular ischemic change. Chronic bilateral maxillary sinus disease. Mild degenerative disc disease C5-6 and C6-7. Electronically Signed   By: Inge Rise M.D.   On: 02/16/2017 14:41   Ct Cervical Spine Wo Contrast  Result Date: 02/16/2017 CLINICAL DATA:  Possible syncope today. The patient fell out of a wheelchair. EXAM: CT HEAD WITHOUT CONTRAST CT CERVICAL SPINE WITHOUT CONTRAST TECHNIQUE: Multidetector CT imaging of the head and cervical spine was performed following the standard protocol without intravenous contrast. Multiplanar CT image reconstructions of the cervical spine were also generated. COMPARISON:  None. FINDINGS: CT HEAD FINDINGS Brain: Remote left MCA territory infarct is identified. No acute abnormality including hemorrhage, infarct, mass lesion, mass effect, midline shift or abnormal extra-axial fluid collection. No hydrocephalus or pneumocephalus. Vascular: Mild atherosclerosis is identified. Skull: Intact. Sinuses/Orbits: There is marked mucosal thickening with calcifications and sclerosis of the walls of the maxillary sinuses. Scattered ethmoid air cell disease is identified. Other: None. CT CERVICAL SPINE FINDINGS Alignment: The neck is in flexion.  No listhesis is identified. Skull base and vertebrae: No acute fracture. No primary bone lesion or focal pathologic process. Soft tissues and  spinal canal: No prevertebral fluid or swelling. No visible canal hematoma. Disc levels:  Mild endplate spurring is seen at C5-6 and C6-7. Upper chest: Lung apices are clear. Other: None. IMPRESSION: No acute abnormality head or cervical spine. Remote left MCA infarct and chronic microvascular ischemic change. Chronic bilateral maxillary sinus disease. Mild degenerative disc disease C5-6 and C6-7. Electronically Signed   By: Inge Rise M.D.   On: 02/16/2017 14:41   Dg Knee Complete 4 Views Right  Result Date: 02/16/2017 CLINICAL DATA:  Pain following fall EXAM: RIGHT KNEE - COMPLETE 4+ VIEW COMPARISON:  None. FINDINGS: Frontal, lateral, and bilateral oblique views were obtained. There is soft tissue swelling. There is a moderate joint effusion. No fracture or dislocation. There is no appreciable joint space narrowing. IMPRESSION: Soft tissue swelling with moderate joint effusion. No acute fracture or dislocation. No appreciable arthropathic change. Electronically Signed   By: Lowella Grip III M.D.   On: 02/16/2017 15:09   Dg Hip Unilat With Pelvis 2-3 Views Right  Result Date: 02/16/2017 CLINICAL DATA:  Pain following fall EXAM: DG HIP (WITH OR WITHOUT PELVIS) 2-3V RIGHT COMPARISON:  None. FINDINGS: Frontal pelvis as well as frontal and lateral right hip images were obtained. There is mild symmetric narrowing of both hip joints. There is no fracture or dislocation. No erosive change. There are foci of atherosclerotic calcification in the external iliac arteries bilaterally. IMPRESSION: Mild symmetric narrowing both hip joints. No fracture or dislocation. There is iliac artery  atherosclerosis. Electronically Signed   By: Lowella Grip III M.D.   On: 02/16/2017 15:08    Microbiology: Recent Results (from the past 240 hour(s))  Urine culture     Status: None   Collection Time: 02/16/17  1:34 PM  Result Value Ref Range Status   Specimen Description URINE, CLEAN CATCH  Final   Special  Requests NONE  Final   Culture   Final    NO GROWTH Performed at [facility], [address] [place], [place] [address]    Report Status 02/18/2017 FINAL  Final  MRSA PCR Screening     Status: None   Collection Time: 02/16/17 10:58 PM  Result Value Ref Range Status   MRSA by PCR NEGATIVE NEGATIVE Final    Comment:        The GeneXpert MRSA Assay (FDA approved for NASAL specimens only), is one component of a comprehensive MRSA colonization surveillance program. It is not intended to diagnose MRSA infection nor to guide or monitor treatment for MRSA infections.      Labs: Basic Metabolic Panel:  Recent Labs Lab 02/16/17 1337 02/17/17 0630 02/18/17 0715 02/19/17 0645 02/20/17 0846  NA 138 139 139 139 139  K 4.7 4.0 4.2 3.9 4.1  CL 99* 104 106 107 110  CO2 30 28 25 25 24   GLUCOSE 87 79 84 84 100*  BUN 39* 36* 30* 25* 20  CREATININE 2.59* 2.33* 2.26* 2.12* 1.92*  CALCIUM 9.7 9.2 8.8* 8.6* 8.6*   Liver Function Tests:  Recent Labs Lab 02/16/17 1337  AST 61*  ALT 32  ALKPHOS 93  BILITOT 0.2*  PROT 9.5*  ALBUMIN 3.1*    Recent Labs Lab 02/16/17 1337  LIPASE 27   CBC:  Recent Labs Lab 02/16/17 1337 02/17/17 0630  WBC 7.6 5.2  NEUTROABS 5.9  --   HGB 12.0* 11.3*  HCT 37.3* 35.3*  MCV 82.0 81.7  PLT 411* 366   Cardiac Enzymes:  Recent Labs Lab 02/16/17 1337  TROPONINI 0.03*    CBG:  Recent Labs Lab 02/19/17 1122 02/19/17 1619 02/19/17 2120 02/20/17 0758 02/20/17 1107  GLUCAP 82 105* 90 101* 57    Principal Problem:   Pyelonephritis Active Problems:   Coronary atherosclerosis of native coronary artery   Diabetes mellitus without complication (HCC)   Chronic kidney disease, stage IV (severe) (HCC)   Hemiparesis affecting right side as late effect of cerebrovascular accident [facility])   Retroperitoneal fibrosis   Time coordinating discharge: 35 minutes  Signed:  Murray Hodgkins, MD [facility] 02/20/2017, 12:10  PM

## 2017-02-20 NOTE — Evaluation (Signed)
Physical Therapy Evaluation Patient Details Name: Brendan [name] MRN: [id number] DOB: [birth year] Today's Date: 02/20/2017   History of Present Illness  56 year old man PMH obstructive uropathy, status post left-sided nephrostomy tube, retroperitoneal fibrosis with ureteral stricture, stroke with residual right-sided hemiplegia, recent UTI who was being transported from skilled rehabilitation facility to [facility] for nephrostomy tube replacement but during transport he was attempting to stand when lower leg caught and he fell and hit his head. Not clear whether he had loss of consciousness or not. Workup in the emergency department suggested UTI and he was admitted for pyelonephritis.    Clinical Impression  Pt received in bed, and was agreeable to PT evaluation.  Pt normally mobilizes in a w/c at his ALF, and although pt states he is independent with ADL's, the facility has reported that he requires extensive assistance for ADL's.  During PT evaluation today, he required Mod A for transfer bed<>chair going to the left.  He then required Mod A +2 for sit<>stand from the chair for peri-hygiene.  He seems to be at his baseline level of function and is recommended to return to his ALF upon d/c.       Follow Up Recommendations No PT follow up    Equipment Recommendations  None recommended by PT    Recommendations for Other Services       Precautions / Restrictions Precautions Precautions: Fall Precaution Comments: 2 falls - reason for admission.  Pt reports that the other fall was when he fell out of the w/c and onto the floor.  Restrictions Weight Bearing Restrictions: No      Mobility  Bed Mobility Overal bed mobility: Needs Assistance Bed Mobility: Supine to Sit     Supine to sit: HOB elevated;Min assist     General bed mobility comments: Assistance to advance R LE off the EOB, and then some assistance to raise trunk up, and increased time to scoot to the EOB.     Transfers Overall transfer level: Needs assistance   Transfers: Squat Pivot Transfers;Sit to/from Stand Sit to Stand: Mod assist;+2 safety/equipment   Squat pivot transfers: Mod assist;From elevated surface (Going to the left )     General transfer comment: While pt was transfering bed<>chair it was noted that he had a BM.  Pt required +2 person assist for sit<>stand transfer with RW for peri-hygiene.    Ambulation/Gait Ambulation/Gait assistance:  (NA - pt is non-ambulatory at baseline.  )              Stairs            Wheelchair Mobility    Modified Rankin (Stroke Patients Only)       Balance Overall balance assessment: History of Falls;Needs assistance Sitting-balance support: Bilateral upper extremity supported;Feet supported Sitting balance-Leahy Scale: Good       Standing balance-Leahy Scale: Fair                               Pertinent Vitals/Pain Pain Assessment: No/denies pain    Home Living                 Home Equipment: Wheelchair - manual      Prior Function Level of Independence: Independent   Gait / Transfers Assistance Needed: Pt states that he normally is independent with seat to seat transfers into his w/c -independently.    ADL's / Homemaking Assistance Needed: pt reports that he is  independent with dressing as well as bathing        Hand Dominance   Dominant Hand: Left (Since CVA)    Extremity/Trunk Assessment   Upper Extremity Assessment Upper Extremity Assessment: RUE deficits/detail RUE Deficits / Details: R UE hemiparesis with 1/5 for shoulder elevation, 2/5 for elbow flexion.  Noted flexor tone increases with mobility.   RUE Coordination: decreased fine motor;decreased gross motor    Lower Extremity Assessment Lower Extremity Assessment: RLE deficits/detail RLE Deficits / Details: R sided hemiparesis, however does have some strength with grossly 2/5 - unable to perform lateral weight shifing  while standing with support therefore unable to perform SLS to be able to perform transfer.  RLE Coordination: decreased fine motor;decreased gross motor    Cervical / Trunk Assessment Cervical / Trunk Assessment: Kyphotic  Communication      Cognition Arousal/Alertness: Awake/alert Behavior During Therapy: Flat affect Overall Cognitive Status: History of cognitive impairments - at baseline                 General Comments: Does not know the year.      General Comments      Exercises     Assessment/Plan    PT Assessment Patent does not need any further PT services  PT Problem List Decreased mobility;Decreased strength;Decreased activity tolerance;Decreased balance       PT Treatment Interventions Therapeutic activities;Functional mobility training;Therapeutic exercise;Balance training    PT Goals (Current goals can be found in the Care Plan section)  Acute Rehab PT Goals Patient Stated Goal: To get back to facility PT Goal Formulation: All assessment and education complete, DC therapy    Frequency     Barriers to discharge        Co-evaluation               End of Session Equipment Utilized During Treatment: Gait belt Activity Tolerance: Patient tolerated treatment well Patient left: in chair;with call bell/phone within reach;with nursing/sitter in room Nurse Communication: Mobility status Janett Billow, RN notified of pt's mobiltiy status.  Ronny Bacon - tech also education on assisting pt with transfer going to the left.  Mobility sheet left in the room. )      Functional Assessment Tool Used: AM-PAC 6 Clicks Basic Mobility;Clinical judgement Functional Limitation: Mobility: Walking and moving around Mobility: Walking and Moving Around Current Status (G6440): At least 60 percent but less than 80 percent impaired, limited or restricted Mobility: Walking and Moving Around Goal Status [phone]): At least 40 percent but less than 60 percent impaired, limited or  restricted    Time: 0900-0930 PT Time Calculation (min) (ACUTE ONLY): 30 min   Charges:   PT Evaluation $PT Eval Low Complexity: 1 Procedure PT Treatments $Therapeutic Activity: 8-22 mins   PT G Codes:   PT G-Codes **NOT FOR INPATIENT CLASS** Functional Assessment Tool Used: AM-PAC 6 Clicks Basic Mobility;Clinical judgement Functional Limitation: Mobility: Walking and moving around Mobility: Walking and Moving Around Current Status (V9563): At least 60 percent but less than 80 percent impaired, limited or restricted Mobility: Walking and Moving Around Goal Status [phone]): At least 40 percent but less than 60 percent impaired, limited or restricted     Beth Aleigha Gilani, PT, DPT X: [phone]

## 2017-02-20 NOTE — Progress Notes (Signed)
Pt discharged to [facility] today per Dr. Sarajane Jews. Pt's IV site D/C'd and WDL.  Pt's VSS. Facility transporters provided with AVS. Verbalized understanding. Report given to Levada Dy, nurse at [facility]. Pt left floor via WC in stable condition accompanied by facility staff.

## 2017-02-20 NOTE — Clinical Social Work Note (Addendum)
Facility is aware that patient is discharging today. Facility will pick patient up later this evening.     Patient is aware of discharge today.   LCSW left a message for patient's sister, Elmo Putt, advising that patient was discharging today.    LCSW signing off.    Jeyla Bulger, Clydene Pugh, LCSW

## 2017-03-06 ENCOUNTER — Ambulatory Visit (HOSPITAL_COMMUNITY)
Admission: RE | Admit: 2017-03-06 | Discharge: 2017-03-06 | Disposition: A | Discharge status: Home / Self Care | Payer: Medicaid Other | Source: Ambulatory Visit | Attending: Urology | Admitting: Urology

## 2017-03-06 ENCOUNTER — Other Ambulatory Visit: Payer: Self-pay | Admitting: Urology

## 2017-03-06 ENCOUNTER — Encounter (HOSPITAL_COMMUNITY): Payer: Self-pay | Admitting: Interventional Radiology

## 2017-03-06 ENCOUNTER — Other Ambulatory Visit (HOSPITAL_COMMUNITY): Payer: Medicaid Other

## 2017-03-06 DIAGNOSIS — N1330 Unspecified hydronephrosis: Secondary | ICD-10-CM

## 2017-03-06 DIAGNOSIS — N135 Crossing vessel and stricture of ureter without hydronephrosis: Secondary | ICD-10-CM | POA: Insufficient documentation

## 2017-03-06 DIAGNOSIS — Z436 Encounter for attention to other artificial openings of urinary tract: Secondary | ICD-10-CM | POA: Diagnosis not present

## 2017-03-06 HISTORY — PX: IR NEPHROSTOMY EXCHANGE LEFT: IMG6069

## 2017-03-06 MED ORDER — IOPAMIDOL (ISOVUE-300) INJECTION 61%
50.0000 mL | Freq: Once | INTRAVENOUS | Status: AC | PRN
Start: 2017-03-06 — End: 2017-03-06
  Administered 2017-03-06: 10 mL

## 2017-03-06 MED ORDER — IOPAMIDOL (ISOVUE-300) INJECTION 61%
INTRAVENOUS | Status: AC
  Filled 2017-03-06: qty 50

## 2017-03-06 MED ORDER — LIDOCAINE HCL 1 % IJ SOLN
INTRAMUSCULAR | Status: AC
  Filled 2017-03-06: qty 20

## 2017-03-06 NOTE — Procedures (Signed)
Chronic LEFT PCN  S/P FLUORO LEFT PCN EXCHG No comp Stable Full report in PACS

## 2017-04-03 ENCOUNTER — Ambulatory Visit (INDEPENDENT_AMBULATORY_CARE_PROVIDER_SITE_OTHER): Payer: Medicaid Other | Admitting: Urology

## 2017-04-03 DIAGNOSIS — N1330 Unspecified hydronephrosis: Secondary | ICD-10-CM | POA: Diagnosis not present

## 2017-04-03 DIAGNOSIS — K689 Other disorders of retroperitoneum: Secondary | ICD-10-CM | POA: Diagnosis not present

## 2017-04-13 ENCOUNTER — Ambulatory Visit (HOSPITAL_COMMUNITY)
Admission: RE | Admit: 2017-04-13 | Discharge: 2017-04-13 | Disposition: A | Discharge status: Home / Self Care | Payer: Medicaid Other | Source: Ambulatory Visit | Attending: Urology | Admitting: Urology

## 2017-04-13 ENCOUNTER — Encounter (HOSPITAL_COMMUNITY): Payer: Self-pay | Admitting: Interventional Radiology

## 2017-04-13 ENCOUNTER — Other Ambulatory Visit: Payer: Self-pay | Admitting: Urology

## 2017-04-13 DIAGNOSIS — Z436 Encounter for attention to other artificial openings of urinary tract: Secondary | ICD-10-CM | POA: Diagnosis not present

## 2017-04-13 DIAGNOSIS — N1330 Unspecified hydronephrosis: Secondary | ICD-10-CM | POA: Diagnosis not present

## 2017-04-13 HISTORY — PX: IR NEPHROSTOMY EXCHANGE LEFT: IMG6069

## 2017-04-13 MED ORDER — LIDOCAINE HCL 1 % IJ SOLN
INTRAMUSCULAR | Status: DC | PRN
Start: 2017-04-13 — End: 2017-04-14
  Administered 2017-04-13: 5 mL

## 2017-04-13 MED ORDER — IOPAMIDOL (ISOVUE-300) INJECTION 61%
10.0000 mL | Freq: Once | INTRAVENOUS | Status: AC | PRN
  Administered 2017-04-13: 10 mL

## 2017-04-13 MED ORDER — IOPAMIDOL (ISOVUE-300) INJECTION 61%
INTRAVENOUS | Status: AC
  Administered 2017-04-13: 10 mL
  Filled 2017-04-13: qty 50

## 2017-04-13 MED ORDER — LIDOCAINE HCL 1 % IJ SOLN
INTRAMUSCULAR | Status: AC
  Filled 2017-04-13: qty 20

## 2017-04-13 NOTE — Procedures (Signed)
Chronic left nephrostomy, routine exchange  Fluoroscopic exchange of the 10 French left nephrostomy  No immediate complication  EBL 0  Full report in PACs

## 2017-05-01 ENCOUNTER — Other Ambulatory Visit (HOSPITAL_COMMUNITY): Payer: Medicaid Other

## 2017-06-08 ENCOUNTER — Ambulatory Visit (HOSPITAL_COMMUNITY)
Admission: RE | Admit: 2017-06-08 | Discharge: 2017-06-08 | Disposition: A | Discharge status: Home / Self Care | Payer: Medicaid Other | Source: Ambulatory Visit | Attending: Urology | Admitting: Urology

## 2017-06-08 ENCOUNTER — Encounter (HOSPITAL_COMMUNITY): Payer: Self-pay | Admitting: Diagnostic Radiology

## 2017-06-08 ENCOUNTER — Other Ambulatory Visit: Payer: Self-pay | Admitting: Urology

## 2017-06-08 DIAGNOSIS — N1330 Unspecified hydronephrosis: Secondary | ICD-10-CM

## 2017-06-08 DIAGNOSIS — Z436 Encounter for attention to other artificial openings of urinary tract: Secondary | ICD-10-CM | POA: Insufficient documentation

## 2017-06-08 HISTORY — PX: IR NEPHROSTOMY EXCHANGE LEFT: IMG6069

## 2017-06-08 MED ORDER — LIDOCAINE HCL 1 % IJ SOLN
INTRAMUSCULAR | Status: AC
  Filled 2017-06-08: qty 20

## 2017-06-08 MED ORDER — IOPAMIDOL (ISOVUE-300) INJECTION 61%
INTRAVENOUS | Status: AC
  Administered 2017-06-08: 10 mL
  Filled 2017-06-08: qty 50

## 2017-06-08 MED ORDER — LIDOCAINE HCL 1 % IJ SOLN
INTRAMUSCULAR | Status: DC | PRN
  Administered 2017-06-08: 5 mL via INTRADERMAL

## 2017-06-08 NOTE — Procedures (Signed)
Successful exchange of left nephrostomy tube.  No blood loss and no immediate complication.

## 2017-07-19 ENCOUNTER — Observation Stay (HOSPITAL_COMMUNITY)
Admission: EM | Admit: 2017-07-19 | Discharge: 2017-07-21 | Disposition: A | Discharge status: Skilled Nursing Facility | Payer: Medicaid Other | Attending: Internal Medicine | Admitting: Internal Medicine

## 2017-07-19 ENCOUNTER — Encounter (HOSPITAL_COMMUNITY): Payer: Self-pay | Admitting: Emergency Medicine

## 2017-07-19 DIAGNOSIS — N12 Tubulo-interstitial nephritis, not specified as acute or chronic: Secondary | ICD-10-CM | POA: Diagnosis not present

## 2017-07-19 DIAGNOSIS — I2510 Atherosclerotic heart disease of native coronary artery without angina pectoris: Secondary | ICD-10-CM | POA: Diagnosis not present

## 2017-07-19 DIAGNOSIS — N184 Chronic kidney disease, stage 4 (severe): Secondary | ICD-10-CM | POA: Diagnosis not present

## 2017-07-19 DIAGNOSIS — I129 Hypertensive chronic kidney disease with stage 1 through stage 4 chronic kidney disease, or unspecified chronic kidney disease: Secondary | ICD-10-CM | POA: Diagnosis not present

## 2017-07-19 DIAGNOSIS — I69351 Hemiplegia and hemiparesis following cerebral infarction affecting right dominant side: Secondary | ICD-10-CM

## 2017-07-19 DIAGNOSIS — N289 Disorder of kidney and ureter, unspecified: Secondary | ICD-10-CM | POA: Diagnosis not present

## 2017-07-19 DIAGNOSIS — K5900 Constipation, unspecified: Secondary | ICD-10-CM | POA: Diagnosis not present

## 2017-07-19 DIAGNOSIS — Z79899 Other long term (current) drug therapy: Secondary | ICD-10-CM | POA: Insufficient documentation

## 2017-07-19 DIAGNOSIS — T83022A Displacement of nephrostomy catheter, initial encounter: Principal | ICD-10-CM | POA: Diagnosis present

## 2017-07-19 DIAGNOSIS — E876 Hypokalemia: Secondary | ICD-10-CM | POA: Insufficient documentation

## 2017-07-19 DIAGNOSIS — I639 Cerebral infarction, unspecified: Secondary | ICD-10-CM | POA: Diagnosis not present

## 2017-07-19 DIAGNOSIS — N135 Crossing vessel and stricture of ureter without hydronephrosis: Secondary | ICD-10-CM | POA: Insufficient documentation

## 2017-07-19 DIAGNOSIS — N401 Enlarged prostate with lower urinary tract symptoms: Secondary | ICD-10-CM | POA: Diagnosis not present

## 2017-07-19 DIAGNOSIS — N139 Obstructive and reflux uropathy, unspecified: Secondary | ICD-10-CM | POA: Insufficient documentation

## 2017-07-19 DIAGNOSIS — N1330 Unspecified hydronephrosis: Secondary | ICD-10-CM | POA: Insufficient documentation

## 2017-07-19 DIAGNOSIS — K682 Retroperitoneal fibrosis: Secondary | ICD-10-CM

## 2017-07-19 DIAGNOSIS — K219 Gastro-esophageal reflux disease without esophagitis: Secondary | ICD-10-CM | POA: Diagnosis present

## 2017-07-19 DIAGNOSIS — E785 Hyperlipidemia, unspecified: Secondary | ICD-10-CM | POA: Insufficient documentation

## 2017-07-19 DIAGNOSIS — Z7982 Long term (current) use of aspirin: Secondary | ICD-10-CM | POA: Diagnosis not present

## 2017-07-19 DIAGNOSIS — E1122 Type 2 diabetes mellitus with diabetic chronic kidney disease: Secondary | ICD-10-CM | POA: Insufficient documentation

## 2017-07-19 DIAGNOSIS — N189 Chronic kidney disease, unspecified: Secondary | ICD-10-CM

## 2017-07-19 HISTORY — DX: Retroperitoneal fibrosis: K68.2

## 2017-07-19 HISTORY — DX: Crossing vessel and stricture of ureter without hydronephrosis: N13.5

## 2017-07-19 LAB — CBC WITH DIFFERENTIAL/PLATELET
Basophils Absolute: 0 10*3/uL (ref 0.0–0.1)
Basophils Relative: 0 %
Eosinophils Absolute: 0.2 10*3/uL (ref 0.0–0.7)
Eosinophils Relative: 3 %
HCT: 42.3 % (ref 39.0–52.0)
HEMOGLOBIN: 13.6 g/dL (ref 13.0–17.0)
LYMPHS ABS: 1.4 10*3/uL (ref 0.7–4.0)
LYMPHS PCT: 18 %
MCH: 26.9 pg (ref 26.0–34.0)
MCHC: 32.2 g/dL (ref 30.0–36.0)
MCV: 83.6 fL (ref 78.0–100.0)
Monocytes Absolute: 0.4 10*3/uL (ref 0.1–1.0)
Monocytes Relative: 6 %
NEUTROS ABS: 5.5 10*3/uL (ref 1.7–7.7)
Neutrophils Relative %: 73 %
Platelets: 173 10*3/uL (ref 150–400)
RBC: 5.06 MIL/uL (ref 4.22–5.81)
RDW: 16.6 % — ABNORMAL HIGH (ref 11.5–15.5)
WBC: 7.6 10*3/uL (ref 4.0–10.5)

## 2017-07-19 LAB — BASIC METABOLIC PANEL
ANION GAP: 9 (ref 5–15)
BUN: 42 mg/dL — ABNORMAL HIGH (ref 6–20)
CHLORIDE: 102 mmol/L (ref 101–111)
CO2: 26 mmol/L (ref 22–32)
Calcium: 9.5 mg/dL (ref 8.9–10.3)
Creatinine, Ser: 2.63 mg/dL — ABNORMAL HIGH (ref 0.61–1.24)
GFR calc non Af Amer: 25 mL/min — ABNORMAL LOW (ref >60)
GFR, EST AFRICAN AMERICAN: 29 mL/min — AB (ref >60)
Glucose, Bld: 80 mg/dL (ref 65–99)
POTASSIUM: 4.7 mmol/L (ref 3.5–5.1)
SODIUM: 137 mmol/L (ref 135–145)

## 2017-07-19 LAB — APTT: APTT: 43 s — AB (ref 24–36)

## 2017-07-19 LAB — URINALYSIS, ROUTINE W REFLEX MICROSCOPIC
BILIRUBIN URINE: NEGATIVE
GLUCOSE, UA: NEGATIVE mg/dL
KETONES UR: NEGATIVE mg/dL
NITRITE: NEGATIVE
Protein, ur: 100 mg/dL — AB
Specific Gravity, Urine: 1.009 (ref 1.005–1.030)
pH: 6 (ref 5.0–8.0)

## 2017-07-19 LAB — PROTIME-INR
INR: 1.35
PROTHROMBIN TIME: 16.8 s — AB (ref 11.4–15.2)

## 2017-07-19 LAB — LACTIC ACID, PLASMA
LACTIC ACID, VENOUS: 1.6 mmol/L (ref 0.5–1.9)
Lactic Acid, Venous: 0.8 mmol/L (ref 0.5–1.9)

## 2017-07-19 LAB — CBG MONITORING, ED: GLUCOSE-CAPILLARY: 140 mg/dL — AB (ref 65–99)

## 2017-07-19 MED ORDER — INSULIN ASPART 100 UNIT/ML ~~LOC~~ SOLN
0.0000 [IU] | SUBCUTANEOUS | Status: DC
  Administered 2017-07-20: 1 [IU] via SUBCUTANEOUS
  Administered 2017-07-20: 2 [IU] via SUBCUTANEOUS

## 2017-07-19 NOTE — ED Provider Notes (Signed)
[place] DEPT Provider Note   CSN: [id number] Arrival date & time: 07/19/17  1701     History   Chief Complaint Chief Complaint  Patient presents with  . Flank Pain    nephrostomy tube replacement    HPI Brendan [name] is a 56 y.o. male.  HPI  Pt was seen at 1700. Per EMS, [facility] and pt report: c/o sudden onset and resolution of one episode of "pulled out nephrostomy tube" that occurred PTA. Pt states he was transferring to a chair and the CNA accidentally snagged his nephrostomy bag, pulling out the tubing. Pt was sent to the ED for replacement of the tube. 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 Pt denies any other complaints. Denies fevers, no dysuria/hematuria, no abd pain, no back pain.   Past Medical History:  Diagnosis Date  . Acute unilateral obstructive uropathy   . Chronic kidney disease, stage IV (severe) (Brendan [name])   . Constipation   . Coronary atherosclerosis of native coronary artery   . Diabetes mellitus without complication (Brendan [name])   . Enlarged prostate with urinary obstruction   . Essential hypertension, malignant   . GERD (gastroesophageal reflux disease)   . Glaucoma   . Hemiparesis affecting right side as late effect of cerebrovascular accident (Brendan [name])   . Hyperlipidemia   . Hypertension   . Hypokalemia   . Muscle weakness (generalized)   . Retroperitoneal fibrosis   . Stricture or kinking of ureter   . Stroke [facility])    right sided hemiparesis    Patient Active Problem List   Diagnosis Date Noted  . Pyelonephritis 02/16/2017  . 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 Coronary atherosclerosis of native coronary artery   . Hypertension   . Stroke ([place])   . Diabetes mellitus without complication ([place])   . Chronic kidney disease, stage IV (severe) ([place])   . GERD (gastroesophageal reflux disease)   . Glaucoma   . Hemiparesis affecting right side as late effect of cerebrovascular accident (Brendan [name])    . Enlarged prostate with urinary obstruction   . Constipation     Past Surgical History:  Procedure Laterality Date  . IR GENERIC HISTORICAL  11/09/2016   IR NEPHROSTOMY EXCHANGE LEFT 11/09/2016 Brendan Mckusick, DO [facility]-INTERV RAD  . IR GENERIC HISTORICAL  01/09/2017   IR NEPHROSTOMY EXCHANGE LEFT 01/09/2017 Brendan Cleveland, MD [facility]-INTERV RAD  . IR NEPHROSTOMY EXCHANGE LEFT  03/06/2017  . IR NEPHROSTOMY EXCHANGE LEFT  04/13/2017  . IR NEPHROSTOMY EXCHANGE LEFT  06/08/2017  . NEPHROSTOMY TUBE PLACEMENT ([place] HX)         Home Medications    Prior to Admission medications   Medication Sig Start Date End Date Taking? Authorizing Provider  acetaminophen (TYLENOL) 325 MG tablet Take 650 mg by mouth 3 (three) times daily as needed for mild pain or moderate pain.    [provider]  aspirin 81 MG chewable tablet Chew 81 mg by mouth daily.    [provider]  calcium carbonate (CALTRATE 600) 1500 (600 Ca) MG TABS tablet Take 1 tablet by mouth 2 (two) times daily.    [provider]  cefUROXime (CEFTIN) 500 MG tablet Take 1 tablet (500 mg total) by mouth 2 (two) times daily with a meal. Last dose 3/22 PM 02/20/17   Brendan Cota, MD  cholecalciferol (VITAMIN D) 400 units TABS tablet Take 400 Units by mouth daily.    [provider]  dronabinol (MARINOL) 2.5 MG  capsule Take 2.5 mg by mouth at bedtime.    [provider]  ferrous sulfate 325 (65 FE) MG tablet Take 325 mg by mouth daily with breakfast.    [provider]  furosemide (LASIX) 40 MG tablet Take 40 mg by mouth daily.    [provider]  polyethylene glycol (MIRALAX / GLYCOLAX) packet Take 17 g by mouth daily.    [provider]  Probiotic Product (PROBIOTIC PO) Take 1 capsule by mouth daily.    [provider]  simvastatin (ZOCOR) 40 MG tablet Take 40 mg by mouth at bedtime.    [provider]  sodium bicarbonate 650 MG tablet Take 650 mg by mouth every  12 (twelve) hours.    [provider]  tamsulosin (FLOMAX) 0.4 MG CAPS capsule Take 0.4 mg by mouth at bedtime.    [provider]    Family History History reviewed. No pertinent family history.  Social History Social History  Substance Use Topics  . Smoking status: Never Smoker  . Smokeless tobacco: Never Used  . Alcohol use Not on file     Allergies   Other   Review of Systems Review of Systems ROS: Statement: All systems negative except as marked or noted in the HPI; Constitutional: Negative for fever and chills. ; ; Eyes: Negative for eye pain, redness and discharge. ; ; ENMT: Negative for ear pain, hoarseness, nasal congestion, sinus pressure and sore throat. ; ; Cardiovascular: Negative for chest pain, palpitations, diaphoresis, dyspnea and peripheral edema. ; ; Respiratory: Negative for cough, wheezing and stridor. ; ; Gastrointestinal: Negative for nausea, vomiting, diarrhea, abdominal pain, blood in stool, hematemesis, jaundice and rectal bleeding. . ; ; Genitourinary: Negative for dysuria, flank pain and hematuria. ; ; Musculoskeletal: Negative for back pain and neck pain. Negative for swelling and deformity.; ; Skin: Negative for pruritus, rash, abrasions, blisters, bruising and skin lesion.; ; Neuro: Negative for headache, lightheadedness and neck stiffness. Negative for weakness, altered level of consciousness, altered mental status, extremity weakness, paresthesias, involuntary movement, seizure and syncope.      Physical Exam Updated Vital Signs BP 126/89 (BP Location: Left Arm)   Pulse 83   Temp 98.2 F (36.8 C) (Oral)   Resp 16   Ht 6' (1.829 m)   Wt 93 kg (205 lb)   SpO2 100%   BMI 27.80 kg/m   Physical Exam 1705: Physical examination:  Nursing notes reviewed; Vital signs and O2 SAT reviewed;  Constitutional: Well developed, Well nourished, Well hydrated, In no acute distress; Head:  Normocephalic, atraumatic; Eyes: EOMI, PERRL, No scleral  icterus; ENMT: Mouth and pharynx normal, Mucous membranes moist; Neck: Supple, Full range of motion, No lymphadenopathy; Cardiovascular: Regular rate and rhythm, No gallop; Respiratory: Breath sounds clear & equal bilaterally, No wheezes.  Speaking full sentences with ease, Normal respiratory effort/excursion; Chest: Nontender, Movement normal; Abdomen: Soft, Nontender, Nondistended, Normal bowel sounds; Genitourinary: No CVA tenderness; Spine:  No midline CS, TS, LS tenderness. +nephrostomy tube site left flank without bleeding.;; Extremities: Pulses normal, No tenderness, No edema, No calf edema or asymmetry.; Neuro: AA&Ox3, Major CN grossly intact.  Speech clear. +right sided weakness per hx, otherwise no gross focal motor deficits in extremities.; Skin: Color normal, Warm, Dry.   ED Treatments / Results  Labs (all labs ordered are listed, but only abnormal results are displayed)   EKG  EKG Interpretation None       Radiology   Procedures Procedures (including critical care time)  Medications Ordered in ED Medications - No data to display   Initial Impression / Assessment and Plan / ED Course  I have reviewed the triage vital signs and the nursing notes.  Pertinent labs & imaging results that were available during my care of the patient were reviewed by me and considered in my medical decision making (see chart for details).  MDM Reviewed: previous chart, nursing note and vitals Reviewed previous: labs Interpretation: labs    Results for orders placed or performed during the hospital encounter of 69/48/54  Basic metabolic panel  Result Value Ref Range   Sodium 137 135 - 145 mmol/L   Potassium 4.7 3.5 - 5.1 mmol/L   Chloride 102 101 - 111 mmol/L   CO2 26 22 - 32 mmol/L   Glucose, Bld 80 65 - 99 mg/dL   BUN 42 (H) 6 - 20 mg/dL   Creatinine, Ser 2.63 (H) 0.61 - 1.24 mg/dL   Calcium 9.5 8.9 - 10.3 mg/dL   GFR calc non Af Amer 25 (L) >60 mL/min   GFR calc Af Amer 29 (L)  >60 mL/min   Anion gap 9 5 - 15  CBC with Differential  Result Value Ref Range   WBC 7.6 4.0 - 10.5 K/uL   RBC 5.06 4.22 - 5.81 MIL/uL   Hemoglobin 13.6 13.0 - 17.0 g/dL   HCT 42.3 39.0 - 52.0 %   MCV 83.6 78.0 - 100.0 fL   MCH 26.9 26.0 - 34.0 pg   MCHC 32.2 30.0 - 36.0 g/dL   RDW 16.6 (H) 11.5 - 15.5 %   Platelets 173 150 - 400 K/uL   Neutrophils Relative % 73 %   Neutro Abs 5.5 1.7 - 7.7 K/uL   Lymphocytes Relative 18 %   Lymphs Abs 1.4 0.7 - 4.0 K/uL   Monocytes Relative 6 %   Monocytes Absolute 0.4 0.1 - 1.0 K/uL   Eosinophils Relative 3 %   Eosinophils Absolute 0.2 0.0 - 0.7 K/uL   Basophils Relative 0 %   Basophils Absolute 0.0 0.0 - 0.1 K/uL  Urinalysis, Routine w reflex microscopic  Result Value Ref Range   Color, Urine YELLOW YELLOW   APPearance CLOUDY (A) CLEAR   Specific Gravity, Urine 1.009 1.005 - 1.030   pH 6.0 5.0 - 8.0   Glucose, UA NEGATIVE NEGATIVE mg/dL   Hgb urine dipstick LARGE (A) NEGATIVE   Bilirubin Urine NEGATIVE NEGATIVE   Ketones, ur NEGATIVE NEGATIVE mg/dL   Protein, ur 100 (A) NEGATIVE mg/dL   Nitrite NEGATIVE NEGATIVE   Leukocytes, UA LARGE (A) NEGATIVE   RBC / HPF TOO NUMEROUS TO COUNT 0 - 5 RBC/hpf   WBC, UA TOO NUMEROUS TO COUNT 0 - 5 WBC/hpf   Bacteria, UA RARE (A) NONE SEEN   Squamous Epithelial / LPF 0-5 (A) NONE SEEN   WBC Clumps PRESENT   Lactic acid, plasma  Result Value Ref Range   Lactic Acid, Venous 0.8 0.5 - 1.9 mmol/L  Protime-INR  Result Value Ref Range   Prothrombin Time 16.8 (H) 11.4 - 15.2 seconds   INR 1.35   APTT  Result Value Ref Range   aPTT 43 (H) 24 - 36 seconds   Results for ZEIN, HELBING (MRN [id number]) as of 07/19/2017 19:36  Ref. Range 02/16/2017 13:37 02/17/2017 06:30 02/18/2017 07:15 02/19/2017 06:45 02/20/2017 08:46 07/19/2017 17:26  BUN Latest Ref Range: 6 - 20 mg/dL 39 (H) 36 (H) 30 (H) 25 (H) 20 42 (H)  Creatinine Latest  Ref Range: 0.61 - 1.24 mg/dL 2.59 (H) 2.33 (H) 2.26 (H) 2.12 (H) 1.92 (H) 2.63 (H)     1715:   T/C to Uro Dr. Alyson Ingles, case discussed, including:  HPI, pertinent PM/SHx, VS/PE, dx testing, ED course and treatment:  Requests to obtain labs, urine, no imaging study needed, will need IR to replace nephrostomy tube (likely tomorrow), requests to have hospitalist service admit/transfer to [facility] for Uro and IR consults.   1935:   No clear UTI on Udip; UC pending.  T/C to [facility]. Marin Comment, case discussed, including:  HPI, pertinent PM/SHx, VS/PE, dx testing, ED course and treatment:  Agreeable to facilitate transfer/admit to [facility].     Final Clinical Impressions(s) / ED Diagnoses   Final diagnoses:  None    New Prescriptions New Prescriptions   No medications on file     Francine Graven, DO 07/22/17 1317

## 2017-07-19 NOTE — H&P (Signed)
History and Physical    Brendan [name] DDU:[id number] DOB: 1961/06/18 DOA: 07/19/2017  PCP: Cleda Mccreedy, MD  Patient coming from:  [facility].    Chief Complaint:  Medical Assistant at SNF accidentally snagged his nephrostomy tube out.   HPI: Brendan [name] is an 56 y.o. male with hx of prior CVA with right hemiparesis, HTN, HLD, retroperitoneal fibrosis, ureteral stricture, chronic nephrostomy tube, DM, CKD4, brought to the ER as his neprhostomy tube was accidentally pulled out.  Work up in the ER showed normal WBC, HB, and UA showed TNTC WBC and RBC's and large leukocytes.  BUN and Cr was slightly elevated at 42 and 2.6. INR 1.3.  EDP spoke with Dr Alda Ponder of urology, who recommended that he be transferred to [facility], and he will see in consultation with planned to replace tube in IR.  Hospitalist was asked to admit him for the same.   ED Course:  See above.  Rewiew of Systems:  Constitutional: Negative for malaise, fever and chills. No significant weight loss or weight gain Eyes: Negative for eye pain, redness and discharge, diplopia, visual changes, or flashes of light. ENMT: Negative for ear pain, hoarseness, nasal congestion, sinus pressure and sore throat. No headaches; tinnitus, drooling, or problem swallowing. Cardiovascular: Negative for chest pain, palpitations, diaphoresis, dyspnea and peripheral edema. ; No orthopnea, PND Respiratory: Negative for cough, hemoptysis, wheezing and stridor. No pleuritic chestpain. Gastrointestinal: Negative for diarrhea, constipation,  melena, blood in stool, hematemesis, jaundice and rectal bleeding.    Genitourinary: Negative for frequency, dysuria, incontinence,flank pain and hematuria; Musculoskeletal: Negative for back pain and neck pain. Negative for swelling and trauma.;  Skin: . Negative for pruritus, rash, abrasions, bruising and skin lesion.; ulcerations Neuro: Negative for headache, lightheadedness and neck stiffness. Negative for weakness,  altered level of consciousness , altered mental status, extremity weakness, burning feet, involuntary movement, seizure and syncope.  Psych: negative for anxiety, depression, insomnia, tearfulness, panic attacks, hallucinations, paranoia, suicidal or homicidal ideation    Past Medical History:  Diagnosis Date  . Acute unilateral obstructive uropathy   . Chronic kidney disease, stage IV (severe) ([place])   . Constipation   . Coronary atherosclerosis of native coronary artery   . Diabetes mellitus without complication ([place])   . Enlarged prostate with urinary obstruction   . Essential hypertension, malignant   . GERD (gastroesophageal reflux disease)   . Glaucoma   . Hemiparesis affecting right side as late effect of cerebrovascular accident ([place])   . Hyperlipidemia   . Hypertension   . Hypokalemia   . Muscle weakness (generalized)   . Retroperitoneal fibrosis   . Stricture or kinking of ureter   . Stroke [facility])    right sided hemiparesis     Past Surgical History:  Procedure Laterality Date  . IR GENERIC HISTORICAL  11/09/2016   IR NEPHROSTOMY EXCHANGE LEFT 11/09/2016 Corrie Mckusick, DO [facility]-INTERV RAD  . IR GENERIC HISTORICAL  01/09/2017   IR NEPHROSTOMY EXCHANGE LEFT 01/09/2017 [name], MD [facility]-INTERV RAD  . IR NEPHROSTOMY EXCHANGE LEFT  03/06/2017  . IR NEPHROSTOMY EXCHANGE LEFT  04/13/2017  . IR NEPHROSTOMY EXCHANGE LEFT  06/08/2017  . NEPHROSTOMY TUBE PLACEMENT ([place] HX)       reports that he has never smoked. He has never used smokeless tobacco. His alcohol and drug histories are not on file.  Allergies  Allergen Reactions  . Other     TB serum-reaction unknown. Provided via [facility] records     History reviewed. No  pertinent family history.   Prior to Admission medications   Medication Sig Start Date End Date Taking? Authorizing Provider  acetaminophen (TYLENOL) 325 MG tablet Take 650 mg by mouth 3 (three) times daily as needed for mild pain or moderate pain.    [provider]  aspirin 81 MG chewable tablet Chew 81 mg by mouth daily.    [provider]  calcium carbonate (CALTRATE 600) 1500 (600 Ca) MG TABS tablet Take 1 tablet by mouth 2 (two) times daily.    [provider]  cefUROXime (CEFTIN) 500 MG tablet Take 1 tablet (500 mg total) by mouth 2 (two) times daily with a meal. Last dose 3/22 PM 02/20/17   Samuella Cota, MD  cholecalciferol (VITAMIN D) 400 units TABS tablet Take 400 Units by mouth daily.    [provider]  dronabinol (MARINOL) 2.5 MG capsule Take 2.5 mg by mouth at bedtime.    [provider]  ferrous sulfate 325 (65 FE) MG tablet Take 325 mg by mouth daily with breakfast.    [provider]  furosemide (LASIX) 40 MG tablet Take 40 mg by mouth daily.    [provider]  polyethylene glycol (MIRALAX / GLYCOLAX) packet Take 17 g by mouth daily.    [provider]  Probiotic Product (PROBIOTIC PO) Take 1 capsule by mouth daily.    [provider]  simvastatin (ZOCOR) 40 MG tablet Take 40 mg by mouth at bedtime.    [provider]  sodium bicarbonate 650 MG tablet Take 650 mg by mouth every 12 (twelve) hours.    [provider]  tamsulosin (FLOMAX) 0.4 MG CAPS capsule Take 0.4 mg by mouth at bedtime.    [provider]    Physical Exam: Vitals:   07/19/17 1704 07/19/17 1707  BP:  126/89  Pulse:  83  Resp:  16  Temp:  98.2 F (36.8 C)  TempSrc:  Oral  SpO2:  100%  Weight: 93 kg (205 lb)   Height: 6' (1.829 m)       Constitutional: NAD, calm, comfortable Vitals:   07/19/17 1704 07/19/17 1707  BP:  126/89  Pulse:  83  Resp:  16  Temp:  98.2 F (36.8 C)  TempSrc:  Oral  SpO2:  100%  Weight: 93 kg (205 lb)   Height: 6' (1.829 m)    Eyes: PERRL, lids and conjunctivae normal ENMT: Mucous membranes are moist. Posterior pharynx clear of any exudate or lesions.Normal dentition.  Neck: normal, supple, no masses, no  thyromegaly Respiratory: clear to auscultation bilaterally, no wheezing, no crackles. Normal respiratory effort. No accessory muscle use.  Cardiovascular: Regular rate and rhythm, no murmurs / rubs / gallops. No extremity edema. 2+ pedal pulses. No carotid bruits.  Abdomen: no tenderness, no masses palpated. No hepatosplenomegaly. Bowel sounds positive.  Musculoskeletal: no clubbing / cyanosis. No joint deformity upper and lower extremities. Good ROM, no contractures. Normal muscle tone.  Skin: no rashes, lesions, ulcers. No induration Neurologic: CN 2-12 grossly intact. Alert and converses meaningfully.  Right sided weakness.  Psychiatric: Normal judgment and insight. Alert and oriented x 3. Normal mood.    Labs on Admission: I have personally reviewed following labs and imaging studies CBC:  Recent Labs Lab 07/19/17 1726  WBC 7.6  NEUTROABS 5.5  HGB 13.6  HCT 42.3  MCV 83.6  PLT 244   Basic Metabolic Panel:  Recent Labs Lab 07/19/17 1726  NA 137  K 4.7  CL 102  CO2 26  GLUCOSE 80  BUN 42*  CREATININE 2.63*  CALCIUM 9.5   Coagulation Profile:  Recent Labs Lab 07/19/17 1726  INR 1.35   Urine analysis:    Component Value Date/Time   COLORURINE YELLOW 07/19/2017 1719   APPEARANCEUR CLOUDY (A) 07/19/2017 1719   LABSPEC 1.009 07/19/2017 1719   PHURINE 6.0 07/19/2017 1719   GLUCOSEU NEGATIVE 07/19/2017 1719   HGBUR LARGE (A) 07/19/2017 1719   BILIRUBINUR NEGATIVE 07/19/2017 [place] 07/19/2017 1719   PROTEINUR 100 (A) 07/19/2017 1719   NITRITE NEGATIVE 07/19/2017 1719   LEUKOCYTESUR LARGE (A) 07/19/2017 1719   Assessment/Plan Principal Problem:   Nephrostomy tube displaced (HCC) Active Problems:   Stroke (HCC)   GERD (gastroesophageal reflux disease)   Hemiparesis affecting right side as late effect of cerebrovascular accident ([place])   Pyelonephritis   Retroperitoneal fibrosis    PLAN:   Nephrostomy tube dislodged:    Will transfer to  [facility] as per urology resquest.  WIll make NPO after midnight.  Give gentle IVF.  Plan to ask IR replacement in the morning per urology.  UTI:  Will give IV rocephin.  CKD4:  Avoid neprhotoxic drug.  Give gentle IVF to prevent pre renal worsening CKD.  Retroperitoneal fibrosis:  Off Cellcept at this time.  Defer to his PCP.   DM:  Will give sensitive correction.    DVT prophylaxis: subQ heparin. Code Status: FULL CODE.  Family Communication: None.  Disposition Plan: to SNF.Marland Kitchen Consults called: Urology Dr Clementeen Graham.  Admission status: OBS>    Shantia Sanford MD FACP. [facility]  If 7PM-7AM, please contact night-coverage [email or web address] Password [facility]  07/19/2017, 7:54 PM

## 2017-07-19 NOTE — ED Triage Notes (Signed)
Pt is from [facility] and states he was transferring to chair and the CNA accidentally snagged his nephrostomy bag and pulled the tubing out.  Pt denies any complaints.

## 2017-07-20 ENCOUNTER — Encounter (HOSPITAL_COMMUNITY): Payer: Self-pay

## 2017-07-20 ENCOUNTER — Observation Stay (HOSPITAL_COMMUNITY): Payer: Medicaid Other

## 2017-07-20 DIAGNOSIS — T83022A Displacement of nephrostomy catheter, initial encounter: Secondary | ICD-10-CM | POA: Diagnosis not present

## 2017-07-20 HISTORY — PX: IR NEPHROSTOMY EXCHANGE LEFT: IMG6069

## 2017-07-20 LAB — GLUCOSE, CAPILLARY
GLUCOSE-CAPILLARY: 106 mg/dL — AB (ref 65–99)
GLUCOSE-CAPILLARY: 108 mg/dL — AB (ref 65–99)
GLUCOSE-CAPILLARY: 119 mg/dL — AB (ref 65–99)
GLUCOSE-CAPILLARY: 169 mg/dL — AB (ref 65–99)
GLUCOSE-CAPILLARY: 82 mg/dL (ref 65–99)

## 2017-07-20 LAB — CBC
HCT: 38.8 % — ABNORMAL LOW (ref 39.0–52.0)
Hemoglobin: 12.5 g/dL — ABNORMAL LOW (ref 13.0–17.0)
MCH: 26 pg (ref 26.0–34.0)
MCHC: 32.2 g/dL (ref 30.0–36.0)
MCV: 80.8 fL (ref 78.0–100.0)
PLATELETS: 190 10*3/uL (ref 150–400)
RBC: 4.8 MIL/uL (ref 4.22–5.81)
RDW: 16.5 % — AB (ref 11.5–15.5)
WBC: 7.8 10*3/uL (ref 4.0–10.5)

## 2017-07-20 LAB — BASIC METABOLIC PANEL
Anion gap: 10 (ref 5–15)
BUN: 42 mg/dL — AB (ref 6–20)
CALCIUM: 9.2 mg/dL (ref 8.9–10.3)
CHLORIDE: 104 mmol/L (ref 101–111)
CO2: 24 mmol/L (ref 22–32)
CREATININE: 2.42 mg/dL — AB (ref 0.61–1.24)
GFR, EST AFRICAN AMERICAN: 33 mL/min — AB (ref >60)
GFR, EST NON AFRICAN AMERICAN: 28 mL/min — AB (ref >60)
Glucose, Bld: 96 mg/dL (ref 65–99)
Potassium: 4.2 mmol/L (ref 3.5–5.1)
SODIUM: 138 mmol/L (ref 135–145)

## 2017-07-20 LAB — MRSA PCR SCREENING: MRSA BY PCR: POSITIVE — AB

## 2017-07-20 MED ORDER — ASPIRIN 81 MG PO CHEW
81.0000 mg | CHEWABLE_TABLET | Freq: Every day | ORAL | Status: DC
  Administered 2017-07-21: 81 mg via ORAL
  Filled 2017-07-20: qty 1

## 2017-07-20 MED ORDER — ONDANSETRON HCL 4 MG PO TABS: 4.0000 mg | ORAL_TABLET | Freq: Four times a day (QID) | ORAL | Status: DC | PRN

## 2017-07-20 MED ORDER — MUPIROCIN 2 % EX OINT
1.0000 "application " | TOPICAL_OINTMENT | Freq: Two times a day (BID) | CUTANEOUS | Status: DC
  Administered 2017-07-20 – 2017-07-21 (×3): 1 via NASAL
  Filled 2017-07-20: qty 22

## 2017-07-20 MED ORDER — POLYETHYLENE GLYCOL 3350 17 G PO PACK
17.0000 g | PACK | Freq: Every day | ORAL | Status: DC
  Administered 2017-07-21: 17 g via ORAL
  Filled 2017-07-20: qty 1

## 2017-07-20 MED ORDER — SIMVASTATIN 20 MG PO TABS
40.0000 mg | ORAL_TABLET | Freq: Every day | ORAL | Status: DC
  Administered 2017-07-20: 40 mg via ORAL
  Filled 2017-07-20: qty 2

## 2017-07-20 MED ORDER — RISAQUAD PO CAPS
1.0000 | ORAL_CAPSULE | Freq: Every day | ORAL | Status: DC
  Administered 2017-07-21: 1 via ORAL
  Filled 2017-07-20: qty 1

## 2017-07-20 MED ORDER — DEXTROSE-NACL 5-0.9 % IV SOLN
INTRAVENOUS | Status: DC
  Administered 2017-07-20 (×2): via INTRAVENOUS

## 2017-07-20 MED ORDER — TAMSULOSIN HCL 0.4 MG PO CAPS
0.4000 mg | ORAL_CAPSULE | Freq: Every day | ORAL | Status: DC
  Administered 2017-07-20: 0.4 mg via ORAL
  Filled 2017-07-20: qty 1

## 2017-07-20 MED ORDER — ONDANSETRON HCL 4 MG/2ML IJ SOLN: 4.0000 mg | Freq: Four times a day (QID) | INTRAMUSCULAR | Status: DC | PRN

## 2017-07-20 MED ORDER — HEPARIN SODIUM (PORCINE) 5000 UNIT/ML IJ SOLN: 5000.0000 [IU] | Freq: Three times a day (TID) | INTRAMUSCULAR | Status: DC

## 2017-07-20 MED ORDER — DEXTROSE 5 % IV SOLN
1.0000 g | Freq: Every day | INTRAVENOUS | Status: DC
  Administered 2017-07-20 (×2): 1 g via INTRAVENOUS
  Filled 2017-07-20 (×3): qty 10

## 2017-07-20 MED ORDER — SODIUM CHLORIDE 0.9% FLUSH: 3.0000 mL | Freq: Two times a day (BID) | INTRAVENOUS | Status: DC

## 2017-07-20 MED ORDER — CHLORHEXIDINE GLUCONATE CLOTH 2 % EX PADS
6.0000 | MEDICATED_PAD | Freq: Every day | CUTANEOUS | Status: DC
  Administered 2017-07-21: 6 via TOPICAL

## 2017-07-20 MED ORDER — ACETAMINOPHEN 325 MG PO TABS: 650.0000 mg | ORAL_TABLET | Freq: Three times a day (TID) | ORAL | Status: DC | PRN

## 2017-07-20 MED ORDER — CALCIUM CARBONATE 1250 (500 CA) MG PO TABS
1.0000 | ORAL_TABLET | Freq: Two times a day (BID) | ORAL | Status: DC
  Administered 2017-07-20 – 2017-07-21 (×2): 500 mg via ORAL
  Filled 2017-07-20 (×3): qty 1

## 2017-07-20 MED ORDER — IOPAMIDOL (ISOVUE-300) INJECTION 61%
10.0000 mL | Freq: Once | INTRAVENOUS | Status: AC | PRN
  Administered 2017-07-20: 10 mL

## 2017-07-20 MED ORDER — ACETAMINOPHEN 325 MG PO TABS: 650.0000 mg | ORAL_TABLET | Freq: Four times a day (QID) | ORAL | Status: DC | PRN

## 2017-07-20 MED ORDER — FERROUS SULFATE 325 (65 FE) MG PO TABS
325.0000 mg | ORAL_TABLET | Freq: Every day | ORAL | Status: DC
  Administered 2017-07-21: 325 mg via ORAL
  Filled 2017-07-20 (×2): qty 1

## 2017-07-20 MED ORDER — MIDAZOLAM HCL 2 MG/2ML IJ SOLN
INTRAMUSCULAR | Status: AC
  Filled 2017-07-20: qty 4

## 2017-07-20 MED ORDER — MIDAZOLAM HCL 2 MG/2ML IJ SOLN
INTRAMUSCULAR | Status: AC | PRN
  Administered 2017-07-20 (×2): 1 mg via INTRAVENOUS

## 2017-07-20 MED ORDER — FENTANYL CITRATE (PF) 100 MCG/2ML IJ SOLN
INTRAMUSCULAR | Status: AC | PRN
  Administered 2017-07-20: 50 ug via INTRAVENOUS

## 2017-07-20 MED ORDER — IOPAMIDOL (ISOVUE-300) INJECTION 61%
INTRAVENOUS | Status: AC
  Filled 2017-07-20: qty 50

## 2017-07-20 MED ORDER — FENTANYL CITRATE (PF) 100 MCG/2ML IJ SOLN
INTRAMUSCULAR | Status: AC
  Filled 2017-07-20: qty 4

## 2017-07-20 MED ORDER — SODIUM BICARBONATE 650 MG PO TABS
650.0000 mg | ORAL_TABLET | Freq: Two times a day (BID) | ORAL | Status: DC
  Administered 2017-07-20: 650 mg via ORAL
  Filled 2017-07-20 (×2): qty 1

## 2017-07-20 MED ORDER — DRONABINOL 2.5 MG PO CAPS
2.5000 mg | ORAL_CAPSULE | Freq: Every day | ORAL | Status: DC
  Administered 2017-07-20: 2.5 mg via ORAL
  Filled 2017-07-20: qty 1

## 2017-07-20 MED ORDER — ACETAMINOPHEN 650 MG RE SUPP: 650.0000 mg | Freq: Four times a day (QID) | RECTAL | Status: DC | PRN

## 2017-07-20 MED ORDER — DRONABINOL 2.5 MG PO CAPS
2.5000 mg | ORAL_CAPSULE | Freq: Every day | ORAL | Status: DC
Start: 2017-07-20 — End: 2017-07-20
  Filled 2017-07-20 (×2): qty 1

## 2017-07-20 NOTE — Progress Notes (Signed)
SQ heparin held due to noting blood tinged urine in urinal on several instances since patient arrival.

## 2017-07-20 NOTE — Progress Notes (Signed)
[facility] Progress Note  Patient: Brendan [name] [id number]:[id number]   PCP: Cleda Mccreedy, MD DOB: Jan 22, 1961   DOA: 07/19/2017   DOS: 07/20/2017   Date of Service: the patient was seen and examined on 07/20/2017  Subjective: Feeling better. No abdominal pain no nausea no vomiting. No active bleeding reported this morning. Did have some bleeding yesterday.  Brief hospital course: Pt. with PMH of retroperitoneal fibrosis, hypertension, dyslipidemia, chronic nephrostomy tube on left, type II DM, CKD stage IV; admitted on 07/19/2017, presented with complaint of accidental removal of the nephrostomy tube. Currently further plan is follow-up on IR consultation for replacement of the neck for stomach tube.  Assessment and Plan: 1. 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 Chronic left nephrostomy tube. Malfunction of the nephrostomy tube. Patient presented with accidental displacement of the nephrostomy tube. Urology was consulted. Recommended to transfer the patient from a pad to [facility]. She was given IV fluids. IR is currently consulted and will follow up on recommendations regarding the plan for placement of the nephrostomy tube. No active bleeding no other medications reported.  2. Suspected UTI. Patient started on IV Rocephin, will continue it while in the hospital. Follow-up on culture.  3. Acute on chronic kidney disease stage IV. Next and mild worsening of renal function from baseline. Continue with IV hydration for now.  4. Type 2 diabetes mellitus. Continue sensitive sliding scale insulin.  Diet: carb modified diet DVT Prophylaxis: mechanical compression device  Advance goals of care discussion: full code  Family Communication: no family was present at bedside, at the time of interview.  Disposition:  Discharge to back to SNF tomorrow after placement of the tube.  Consultants: IR Procedures:  none  Antibiotics: Anti-infectives    Start     Dose/Rate Route Frequency Ordered Stop   07/20/17 0115  cefTRIAXone (ROCEPHIN) 1 g in dextrose 5 % 50 mL IVPB     1 g 100 mL/hr over 30 Minutes Intravenous Daily at bedtime 07/20/17 0054         Objective: Physical Exam: Vitals:   07/19/17 2300 07/20/17 0029 07/20/17 0605 07/20/17 1420  BP: 103/81 123/76 110/80 126/84  Pulse: 85 90 95 92  Resp:  18 16 14   Temp:  99 F (37.2 C) (!) 97.5 F (36.4 C) 98.8 F (37.1 C)  TempSrc:  Axillary Oral Oral  SpO2: 100% 100% 100% 100%  Weight:      Height:        Intake/Output Summary (Last 24 hours) at 07/20/17 1504 Last data filed at 07/20/17 1457  Gross per 24 hour  Intake                0 ml  Output              975 ml  Net             -975 ml   Filed Weights   07/19/17 1704  Weight: 93 kg (205 lb)   General: Alert, Awake and Oriented to Time, Place and Person. Appear in mild distress, affect appropriate Eyes: PERRL, Conjunctiva normal ENT: Oral Mucosa clear moist. Neck: difficult to assess JVD, no Abnormal Mass Or lumps Cardiovascular: S1 and S2 Present, no Murmur, Peripheral Pulses Present Respiratory: normal respiratory effort, Bilateral Air entry equal and Decreased, no use of accessory muscle, faint basal Crackles, no wheezes Abdomen: Bowel Sound present, Soft and no tenderness, no hernia Skin: no redness, no Rash, no induration Extremities: no  Pedal edema, no calf tenderness Neurologic: Grossly no focal neuro deficit. Bilaterally Equal motor strength  Data Reviewed: CBC:  Recent Labs Lab 07/19/17 1726 07/20/17 0502  WBC 7.6 7.8  NEUTROABS 5.5  --   HGB 13.6 12.5*  HCT 42.3 38.8*  MCV 83.6 80.8  PLT 173 371   Basic Metabolic Panel:  Recent Labs Lab 07/19/17 1726 07/20/17 0502  NA 137 138  K 4.7 4.2  CL 102 104  CO2 26 24  GLUCOSE 80 96  BUN 42* 42*  CREATININE 2.63* 2.42*  CALCIUM 9.5 9.2    Liver Function Tests: No results for input(s): AST,  ALT, ALKPHOS, BILITOT, PROT, ALBUMIN in the last 168 hours. No results for input(s): LIPASE, AMYLASE in the last 168 hours. No results for input(s): AMMONIA in the last 168 hours. Coagulation Profile:  Recent Labs Lab 07/19/17 1726  INR 1.35   Cardiac Enzymes: No results for input(s): CKTOTAL, CKMB, CKMBINDEX, TROPONINI in the last 168 hours. BNP (last 3 results) No results for input(s): PROBNP in the last 8760 hours. CBG:  Recent Labs Lab 07/19/17 2230 07/20/17 0738 07/20/17 1136  GLUCAP 140* 106* 82   Studies: No results found.  Scheduled Meds: . acidophilus  1 capsule Oral Daily  . aspirin  81 mg Oral Daily  . calcium carbonate  1 tablet Oral BID  . Chlorhexidine Gluconate Cloth  6 each Topical Q0600  . dronabinol  2.5 mg Oral QHS  . ferrous sulfate  325 mg Oral Q breakfast  . insulin aspart  0-9 Units Subcutaneous Q4H  . mupirocin ointment  1 application Nasal BID  . polyethylene glycol  17 g Oral Daily  . simvastatin  40 mg Oral QHS  . sodium bicarbonate  650 mg Oral Q12H  . sodium chloride flush  3 mL Intravenous Q12H  . tamsulosin  0.4 mg Oral QHS   Continuous Infusions: . cefTRIAXone (ROCEPHIN)  IV 1 g (07/20/17 0600)  . dextrose 5 % and 0.9% NaCl 75 mL/hr at 07/20/17 0600   PRN Meds: acetaminophen **OR** acetaminophen, ondansetron **OR** ondansetron (ZOFRAN) IV  Time spent: 35 minutes  Author: Berle Mull, MD [facility] Pager: [phone] 07/20/2017 3:04 PM  If 7PM-7AM, please contact night-coverage at [email or web address], password [email or web address]

## 2017-07-20 NOTE — Procedures (Signed)
Chronic left hydro  S/p left pcn replacement No comp Stable Full report in pacs

## 2017-07-20 NOTE — Progress Notes (Signed)
Pt received via ambulance from [facility] ER. Pt A&O responds to questions appropriately. Pt has right sided hemiparesis due to previous CVA. Pt uses urinal without difficulty. IV access noted to LAC, not flushed at this time. Pt oriented to room. Instructed on NPO status, acknowledged understanding. SR in upright position, bed in low position, call light within easy reach of patient. No acute distress noted.

## 2017-07-20 NOTE — Progress Notes (Signed)
Attempting to flush LAC, swelling at site noted, pt reports area being painful. IV site discontinued. Attempt by this writer to restart IV unsuccessful in left wrist. Pt requested that the "vein finder" be used to obtain IV access due to numerous sticks at previous facility before arrival to [facility]. IV team notified by order placement.

## 2017-07-21 DIAGNOSIS — T83022A Displacement of nephrostomy catheter, initial encounter: Secondary | ICD-10-CM | POA: Diagnosis not present

## 2017-07-21 LAB — URINE CULTURE

## 2017-07-21 LAB — GLUCOSE, CAPILLARY
GLUCOSE-CAPILLARY: 99 mg/dL (ref 65–99)
GLUCOSE-CAPILLARY: 116 mg/dL — AB (ref 65–99)
Glucose-Capillary: 85 mg/dL (ref 65–99)

## 2017-07-21 MED ORDER — FUROSEMIDE 40 MG PO TABS: 40.0000 mg | ORAL_TABLET | Freq: Every day | ORAL | 0 refills | Status: DC | PRN

## 2017-07-21 NOTE — Progress Notes (Signed)
[place], facility pt discharged to, and gave report to his nurse. She verbalized understanding and stated she had no questions.

## 2017-07-21 NOTE — Progress Notes (Signed)
Patient will discharge to [facility] Anticipated discharge date: 8/18 Family notified: Danton Clap (pt mother)- left message Transportation by [facility]- scheduled 1pm  CSW signing off.  Jorge Ny, LCSW Clinical Social Worker [phone]

## 2017-07-21 NOTE — Progress Notes (Addendum)
Reviewed AVS and discharge summary w/pt and his parents. He verbalized understanding and provided teachback. Pt ready for discharge to SNF, [place], via [place] w/all belongings and pain controlled.

## 2017-07-21 NOTE — Discharge Summary (Signed)
[facility] Discharge Summary   Patient: Brendan [name] GYI:[id number]   PCP: Cleda Mccreedy, MD DOB: 07-30-1961   Date of admission: 07/19/2017   Date of discharge:  07/21/2017    Discharge Diagnoses:  Principal Problem:   Nephrostomy tube displaced [facility]) Active Problems:   Stroke ([place])   GERD (gastroesophageal reflux disease)   Hemiparesis affecting right side as late effect of cerebrovascular accident [facility])   Pyelonephritis   Retroperitoneal fibrosis   Admitted From: SNF Disposition:  SNF  Recommendations for Outpatient Follow-up:  1. Please follow up with PCP in 1 week with repeat BMP, need to adjust lasix dose, currently on PRN basis on discharge.   Diet recommendation: cardiac diet  Activity: The patient is advised to avoid heavy lifting until better.  Discharge Condition: good  Code Status: full code  History of present illness: As per the H and P dictated on admission, "Brendan [name] is an 56 y.o. male with hx of prior CVA with right hemiparesis, HTN, HLD, retroperitoneal fibrosis, ureteral stricture, chronic nephrostomy tube, DM, CKD4, brought to the ER as his neprhostomy tube was accidentally pulled out.  Work up in the ER showed normal WBC, HB, and UA showed TNTC WBC and RBC's and large leukocytes.  BUN and Cr was slightly elevated at 42 and 2.6. INR 1.3.  EDP spoke with Dr Alda Ponder of urology, who recommended that he be transferred to [facility], and he will see in consultation with planned to replace tube in IR.  Hospitalist was asked to admit him for the same. "  Hospital Course:  Summary of his active problems in the hospital is as following. 1. History of retroperitoneal fibrosis. Chronic kidney disease stage IV. Chronic left nephrostomy tube. Malfunction of the nephrostomy tube. Patient presented with accidental displacement of the nephrostomy tube. Urology was consulted. Recommended to transfer the patient from a pad to [facility]. She was given IV fluids. IR  is currently consulted and will follow up on recommendations regarding the plan for placement of the nephrostomy tube. No active bleeding no other medications reported.  2. Suspected UTI. Patient started on IV Rocephin, continued it while in the hospital. Does not feel the need to continue at discharge since no symptoms, no fever, no leucocytosis  Follow-up on culture.  3. Acute on chronic kidney disease stage IV.  mild worsening of renal function from baseline. Was given IV hydration. Will change lasix to PRN on discharge. May need to start back in 1 week with lower dose. Repeat BMP ion 1 week  4. Type 2 diabetes mellitus. Continue home regimen   All other chronic medical condition were stable during the hospitalization.  Pt already at SNF, transfer to which was arranged by Education officer, museum and case Freight forwarder. On the day of the discharge the patient's vitals were stable, and no other acute medical condition were reported by patient. the patient was felt safe to be discharge at SNF with therapy.  Procedures and Results:  IR guided left nephrostomy placement   Consultations:  IR  DISCHARGE MEDICATION: Current Discharge Medication List    CONTINUE these medications which have CHANGED   Details  furosemide (LASIX) 40 MG tablet Take 1 tablet (40 mg total) by mouth daily as needed for fluid or edema. Qty: 30 tablet, Refills: 0      CONTINUE these medications which have NOT CHANGED   Details  acetaminophen (TYLENOL) 325 MG tablet Take 650 mg by mouth 3 (three) times daily as needed for mild pain  or moderate pain.    calcium carbonate (CALTRATE 600) 1500 (600 Ca) MG TABS tablet Take 1 tablet by mouth 2 (two) times daily.    Cholecalciferol 1000 units tablet Take 1,000 Units by mouth daily.     docusate sodium (COLACE) 100 MG capsule Take 100 mg by mouth daily.    mycophenolate (CELLCEPT) 500 MG tablet Take 500 mg by mouth 2 (two) times daily.    Omega-3 Fatty Acids (FISH OIL)  1000 MG CAPS Take 1 capsule by mouth daily.    polyethylene glycol (MIRALAX / GLYCOLAX) packet Take 17 g by mouth daily.    Probiotic Product (PROBIOTIC PO) Take 1 capsule by mouth daily.    rivaroxaban (XARELTO) 20 MG TABS tablet Take 20 mg by mouth daily.    senna (SENOKOT) 8.6 MG TABS tablet Take 1 tablet by mouth 2 (two) times daily.    simvastatin (ZOCOR) 40 MG tablet Take 40 mg by mouth at bedtime.    tamsulosin (FLOMAX) 0.4 MG CAPS capsule Take 0.4 mg by mouth at bedtime.      STOP taking these medications     mirtazapine (REMERON) 15 MG tablet      sodium bicarbonate 650 MG tablet      aspirin 81 MG chewable tablet      dronabinol (MARINOL) 2.5 MG capsule      ferrous sulfate 325 (65 FE) MG tablet        Allergies  Allergen Reactions  . Other     TB serum-reaction unknown. Provided via [facility] records    Discharge Instructions    Diet - low sodium heart healthy    Complete by:  As directed    Discharge instructions    Complete by:  As directed    It is important that you read following instructions as well as go over your medication list with RN to help you understand your care after this hospitalization.  Discharge Instructions: Please follow-up with PCP in one week  Please request your primary care physician to go over all Hospital Tests and Procedure/Radiological results at the follow up,  Please get all Hospital records sent to your PCP by signing hospital release before you go home.   Do not take more than prescribed Pain, Sleep and Anxiety Medications. You were cared for by a hospitalist during your hospital stay. If you have any questions about your discharge medications or the care you received while you were in the hospital after you are discharged, you can call the unit and ask to speak with the hospitalist on call if the hospitalist that took care of you is not available.  Once you are discharged, your primary care physician will handle any further  medical issues. Please note that NO REFILLS for any discharge medications will be authorized once you are discharged, as it is imperative that you return to your primary care physician (or establish a relationship with a primary care physician if you do not have one) for your aftercare needs so that they can reassess your need for medications and monitor your lab values. You Must read complete instructions/literature along with all the possible adverse reactions/side effects for all the Medicines you take and that have been prescribed to you. Take any new Medicines after you have completely understood and accept all the possible adverse reactions/side effects. Wear Seat belts while driving. If you have smoked or chewed Tobacco in the last 2 yrs please stop smoking and/or stop any Recreational drug use.  Increase activity slowly    Complete by:  As directed      Discharge Exam: Filed Weights   07/19/17 1704  Weight: 93 kg (205 lb)   Vitals:   07/20/17 2140 07/21/17 0649  BP: 113/90 102/77  Pulse: (!) 110 (!) 102  Resp: 18 16  Temp: 99.5 F (37.5 C) 98.9 F (37.2 C)  SpO2: 98% 98%   General: Appear in no distress, no Rash; Oral Mucosa moist Cardiovascular: S1 and S2 Present, no Murmur, difficult to assess JVD Respiratory: Bilateral Air entry present and Clear to Auscultation, no Crackles, no wheezes Abdomen: Bowel Sound present, Soft and no tenderness Extremities: no Pedal edema, no calf tenderness Neurology: Grossly no focal neuro deficit.  The results of significant diagnostics from this hospitalization (including imaging, microbiology, ancillary and laboratory) are listed below for reference.    Significant Diagnostic Studies: Ir Nephrostomy Exchange Left  Result Date: 07/20/2017 INDICATION: Retroperitoneal fibrosis, chronic obstructive left hydronephrosis, left nephrostomy accidentally removed EXAM: Fluoroscopic replacement of the left 10 French nephrostomy COMPARISON:   06/08/2017 MEDICATIONS: Patient is already receiving Rocephin as an inpatient ANESTHESIA/SEDATION: Fentanyl 50 mcg IV; Versed 2.0 mg IV Moderate Sedation Time:  10 minutes The patient was continuously monitored during the procedure by the interventional radiology nurse under my direct supervision. CONTRAST:  10 cc Isovue 300 - administered into the collecting system(s) FLUOROSCOPY TIME:  Fluoroscopy Time: 1 minutes 18 seconds ( 28 mGy). COMPLICATIONS: None immediate. PROCEDURE: Informed written consent was obtained from the patient after a thorough discussion of the procedural risks, benefits and alternatives. All questions were addressed. Maximal Sterile Barrier Technique was utilized including caps, mask, sterile gowns, sterile gloves, sterile drape, hand hygiene and skin antiseptic. A timeout was performed prior to the initiation of the procedure. Under sterile conditions, the existing left flank percutaneous tract was catheterized with a Kumpe catheter. Contrast injection confirms patency of the percutaneous tract. Catheter and guidewire advanced into the dilated left collecting system. Contrast injection confirms position of the catheter access within the kidney. Marked hydronephrosis evident. Bentson guidewire inserted followed by advancement of a 10 French nephrostomy. Retention loop formed in the renal pelvis. Position confirmed with fluoroscopy and contrast. Images obtained for documentation. Catheter secured with a Prolene suture and connected to external gravity drainage bag. Sterile dressing applied. No immediate complication. Patient tolerated the procedure well. IMPRESSION: Successful fluoroscopic replacement of the 10 French left nephrostomy through the existing percutaneous tract. Electronically Signed   By: Jerilynn Mages.  Shick M.D.   On: 07/20/2017 16:47    Microbiology: Recent Results (from the past 240 hour(s))  MRSA PCR Screening     Status: Abnormal   Collection Time: 07/20/17  3:25 AM  Result  Value Ref Range Status   MRSA by PCR POSITIVE (A) NEGATIVE Final    Comment:        The GeneXpert MRSA Assay (FDA approved for NASAL specimens only), is one component of a comprehensive MRSA colonization surveillance program. It is not intended to diagnose MRSA infection nor to guide or monitor treatment for MRSA infections. RESULT CALLED TO, READ BACK BY AND VERIFIED WITH: CHERRY DESPOT,RN [id number] @ 0602 BY J SCOTTON      Labs: CBC:  Recent Labs Lab 07/19/17 1726 07/20/17 0502  WBC 7.6 7.8  NEUTROABS 5.5  --   HGB 13.6 12.5*  HCT 42.3 38.8*  MCV 83.6 80.8  PLT 173 509   Basic Metabolic Panel:  Recent Labs Lab 07/19/17 1726 07/20/17 0502  NA 137  138  K 4.7 4.2  CL 102 104  CO2 26 24  GLUCOSE 80 96  BUN 42* 42*  CREATININE 2.63* 2.42*  CALCIUM 9.5 9.2   CBG:  Recent Labs Lab 07/20/17 1713 07/20/17 2006 07/20/17 2359 07/21/17 0416 07/21/17 0731  GLUCAP 108* 169* 119* 99 116*   Time spent: 35 minutes  Signed:  Jeremaine Maraj  [facility]  07/21/2017  , 9:14 AM

## 2017-08-03 ENCOUNTER — Encounter (HOSPITAL_COMMUNITY): Payer: Self-pay | Admitting: Radiology

## 2017-08-03 ENCOUNTER — Observation Stay (HOSPITAL_COMMUNITY)
Admission: EM | Admit: 2017-08-03 | Discharge: 2017-08-04 | Disposition: A | Discharge status: Home / Self Care | Payer: Medicaid Other | Attending: Internal Medicine | Admitting: Internal Medicine

## 2017-08-03 ENCOUNTER — Emergency Department (HOSPITAL_COMMUNITY): Payer: Medicaid Other

## 2017-08-03 ENCOUNTER — Observation Stay (HOSPITAL_COMMUNITY): Payer: Medicaid Other

## 2017-08-03 ENCOUNTER — Other Ambulatory Visit (HOSPITAL_COMMUNITY): Payer: Medicaid Other

## 2017-08-03 DIAGNOSIS — I10 Essential (primary) hypertension: Secondary | ICD-10-CM | POA: Diagnosis present

## 2017-08-03 DIAGNOSIS — T83022A Displacement of nephrostomy catheter, initial encounter: Secondary | ICD-10-CM | POA: Diagnosis present

## 2017-08-03 DIAGNOSIS — Z7901 Long term (current) use of anticoagulants: Secondary | ICD-10-CM | POA: Insufficient documentation

## 2017-08-03 DIAGNOSIS — N12 Tubulo-interstitial nephritis, not specified as acute or chronic: Secondary | ICD-10-CM | POA: Diagnosis not present

## 2017-08-03 DIAGNOSIS — I69351 Hemiplegia and hemiparesis following cerebral infarction affecting right dominant side: Secondary | ICD-10-CM | POA: Diagnosis not present

## 2017-08-03 DIAGNOSIS — N10 Acute tubulo-interstitial nephritis: Secondary | ICD-10-CM | POA: Diagnosis not present

## 2017-08-03 DIAGNOSIS — R319 Hematuria, unspecified: Secondary | ICD-10-CM

## 2017-08-03 DIAGNOSIS — N99528 Other complication of other external stoma of urinary tract: Secondary | ICD-10-CM | POA: Diagnosis not present

## 2017-08-03 DIAGNOSIS — I129 Hypertensive chronic kidney disease with stage 1 through stage 4 chronic kidney disease, or unspecified chronic kidney disease: Secondary | ICD-10-CM | POA: Insufficient documentation

## 2017-08-03 DIAGNOSIS — R310 Gross hematuria: Secondary | ICD-10-CM

## 2017-08-03 DIAGNOSIS — N135 Crossing vessel and stricture of ureter without hydronephrosis: Secondary | ICD-10-CM | POA: Diagnosis not present

## 2017-08-03 DIAGNOSIS — R509 Fever, unspecified: Secondary | ICD-10-CM

## 2017-08-03 DIAGNOSIS — N184 Chronic kidney disease, stage 4 (severe): Secondary | ICD-10-CM | POA: Insufficient documentation

## 2017-08-03 DIAGNOSIS — Z79899 Other long term (current) drug therapy: Secondary | ICD-10-CM | POA: Insufficient documentation

## 2017-08-03 DIAGNOSIS — I2510 Atherosclerotic heart disease of native coronary artery without angina pectoris: Secondary | ICD-10-CM | POA: Diagnosis not present

## 2017-08-03 HISTORY — PX: IR NEPHROSTOMY EXCHANGE LEFT: IMG6069

## 2017-08-03 LAB — BASIC METABOLIC PANEL
Anion gap: 7 (ref 5–15)
BUN: 35 mg/dL — AB (ref 6–20)
CALCIUM: 9.2 mg/dL (ref 8.9–10.3)
CO2: 26 mmol/L (ref 22–32)
CREATININE: 2.51 mg/dL — AB (ref 0.61–1.24)
Chloride: 106 mmol/L (ref 101–111)
GFR, EST AFRICAN AMERICAN: 31 mL/min — AB (ref >60)
GFR, EST NON AFRICAN AMERICAN: 27 mL/min — AB (ref >60)
Glucose, Bld: 100 mg/dL — ABNORMAL HIGH (ref 65–99)
Potassium: 4.4 mmol/L (ref 3.5–5.1)
SODIUM: 139 mmol/L (ref 135–145)

## 2017-08-03 LAB — URINALYSIS, ROUTINE W REFLEX MICROSCOPIC

## 2017-08-03 LAB — CBC WITH DIFFERENTIAL/PLATELET
BASOS PCT: 0 %
Basophils Absolute: 0 10*3/uL (ref 0.0–0.1)
EOS ABS: 0.2 10*3/uL (ref 0.0–0.7)
EOS PCT: 3 %
HCT: 34.8 % — ABNORMAL LOW (ref 39.0–52.0)
HEMOGLOBIN: 11.3 g/dL — AB (ref 13.0–17.0)
LYMPHS ABS: 1.3 10*3/uL (ref 0.7–4.0)
Lymphocytes Relative: 18 %
MCH: 26.4 pg (ref 26.0–34.0)
MCHC: 32.5 g/dL (ref 30.0–36.0)
MCV: 81.3 fL (ref 78.0–100.0)
MONOS PCT: 11 %
Monocytes Absolute: 0.8 10*3/uL (ref 0.1–1.0)
NEUTROS PCT: 68 %
Neutro Abs: 5 10*3/uL (ref 1.7–7.7)
PLATELETS: 183 10*3/uL (ref 150–400)
RBC: 4.28 MIL/uL (ref 4.22–5.81)
RDW: 17.6 % — ABNORMAL HIGH (ref 11.5–15.5)
WBC: 7.4 10*3/uL (ref 4.0–10.5)

## 2017-08-03 LAB — URINALYSIS, MICROSCOPIC (REFLEX)

## 2017-08-03 MED ORDER — ACETAMINOPHEN 500 MG PO TABS
1000.0000 mg | ORAL_TABLET | Freq: Once | ORAL | Status: AC
  Administered 2017-08-03: 1000 mg via ORAL
  Filled 2017-08-03: qty 2

## 2017-08-03 MED ORDER — TAMSULOSIN HCL 0.4 MG PO CAPS
0.4000 mg | ORAL_CAPSULE | Freq: Every day | ORAL | Status: DC
  Administered 2017-08-03: 0.4 mg via ORAL
  Filled 2017-08-03: qty 1

## 2017-08-03 MED ORDER — DEXTROSE 5 % IV SOLN
1.0000 g | Freq: Once | INTRAVENOUS | Status: AC
  Administered 2017-08-03: 1 g via INTRAVENOUS
  Filled 2017-08-03: qty 10

## 2017-08-03 MED ORDER — ACETAMINOPHEN 325 MG PO TABS: 650.0000 mg | ORAL_TABLET | Freq: Four times a day (QID) | ORAL | Status: DC | PRN

## 2017-08-03 MED ORDER — OXYCODONE HCL 5 MG PO TABS
5.0000 mg | ORAL_TABLET | ORAL | Status: DC | PRN
  Administered 2017-08-03: 5 mg via ORAL
  Filled 2017-08-03: qty 1

## 2017-08-03 MED ORDER — IOPAMIDOL (ISOVUE-300) INJECTION 61%
50.0000 mL | Freq: Once | INTRAVENOUS | Status: AC | PRN
  Administered 2017-08-03: 10 mL

## 2017-08-03 MED ORDER — IOPAMIDOL (ISOVUE-300) INJECTION 61%
INTRAVENOUS | Status: AC
  Administered 2017-08-03: 10 mL
  Filled 2017-08-03: qty 50

## 2017-08-03 MED ORDER — DEXTROSE 5 % IV SOLN
1.0000 g | INTRAVENOUS | Status: DC
  Administered 2017-08-04: 1 g via INTRAVENOUS
  Filled 2017-08-03: qty 10

## 2017-08-03 MED ORDER — SENNA 8.6 MG PO TABS
1.0000 | ORAL_TABLET | Freq: Two times a day (BID) | ORAL | Status: DC
  Administered 2017-08-03 – 2017-08-04 (×3): 8.6 mg via ORAL
  Filled 2017-08-03 (×3): qty 1

## 2017-08-03 MED ORDER — DOCUSATE SODIUM 100 MG PO CAPS
100.0000 mg | ORAL_CAPSULE | Freq: Every day | ORAL | Status: DC
  Administered 2017-08-03 – 2017-08-04 (×2): 100 mg via ORAL
  Filled 2017-08-03 (×2): qty 1

## 2017-08-03 MED ORDER — SODIUM CHLORIDE 0.9 % IV SOLN
INTRAVENOUS | Status: AC
  Administered 2017-08-03: 1000 mL via INTRAVENOUS

## 2017-08-03 MED ORDER — MYCOPHENOLATE MOFETIL 250 MG PO CAPS
500.0000 mg | ORAL_CAPSULE | Freq: Two times a day (BID) | ORAL | Status: DC
  Administered 2017-08-03 – 2017-08-04 (×3): 500 mg via ORAL
  Filled 2017-08-03 (×3): qty 2

## 2017-08-03 MED ORDER — LIDOCAINE HCL (PF) 1 % IJ SOLN
INTRAMUSCULAR | Status: DC | PRN
  Administered 2017-08-03: 5 mL via INTRADERMAL

## 2017-08-03 MED ORDER — ACETAMINOPHEN 650 MG RE SUPP: 650.0000 mg | Freq: Four times a day (QID) | RECTAL | Status: DC | PRN

## 2017-08-03 MED ORDER — SIMVASTATIN 40 MG PO TABS
40.0000 mg | ORAL_TABLET | Freq: Every day | ORAL | Status: DC
  Administered 2017-08-03: 40 mg via ORAL
  Filled 2017-08-03: qty 1

## 2017-08-03 MED ORDER — LIDOCAINE HCL (PF) 1 % IJ SOLN
INTRAMUSCULAR | Status: AC
  Filled 2017-08-03: qty 30

## 2017-08-03 NOTE — ED Triage Notes (Addendum)
Correction to previous note tube on left flank is a nephrostomy tube and has been having bloody drainage since being dislodged on Thursday past

## 2017-08-03 NOTE — Progress Notes (Signed)
Patient from ED. Alert and oriented x 3. Has slurred speech,able to express self, hx of CVA. Has R sided weakness. L nephrostomy site tender to touch, denies pain. Oriented to room and environment. Vital signs obtained. Will continue to monitor.

## 2017-08-03 NOTE — ED Notes (Signed)
Bed: AY04 Expected date:  Expected time:  Means of arrival:  Comments: EMS [place]/GI bleed

## 2017-08-03 NOTE — ED Triage Notes (Signed)
Pt from [facility] d/t Hx of Stroke with right side deficits. Pt sent to ED tonight for fever after having left side ileostomy tube accidentally pulled out during move by staff and reinserted by same staff member this Thursday past . Pt has since developed fever with decreased appetite and ileostomy tube insertion site edema. Currently denies pain or other symptoms.

## 2017-08-03 NOTE — Progress Notes (Signed)
Patient back to room from IR.post replacement of L nephrostomy tube. Site clean,dry and intact. Emptied nephrostomy bag amounting to 150 cc of bloody drainage. Patient denies pain. Vital signs taken. Comfortable in bed. Will continue to monitor.

## 2017-08-03 NOTE — Procedures (Signed)
Interventional Radiology Procedure Note  Procedure:  Replacement of left nephrostomy tube  Complications: None  Estimated Blood Loss: None  Wire access regained to level of renal collecting system.  New 10 Fr PCN advanced over wire and advanced into central collecting sytem.  Attached to gravity bag.  Venetia Night. Kathlene Cote, M.D Pager:  [phone]

## 2017-08-03 NOTE — H&P (Signed)
History and Physical  [name]     MGQ:[id number]    DOB: 05/12/1961    DOA: 08/03/2017 PCP: Cleda Mccreedy, MD  Patient coming from: SNF  Chief Complaint: Dislodged tube, fever, hematuria      HPI: Brendan [name] is a 56 y.o. 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  The patient was in his usual state of health until 2 days ago when nursing aide was helping him roll over in bed, and his nephrostomy tube was dislodged. Staff at his facility tried to replace the nephrostomy tube at the bedside, unfortunately they didn't use fluoroscopy, and ever since it has been draining blood, he has had an intermittent fever, and has had progressive malaise, lack of appetite and weakness.  Today, he felt worse, so he was brought to the ER.  ED course: -Temp 100.37F, heart rate 100, respirations and pulse are normal, blood pressure 127 rate 82 -Na 139, K 4.4, Cr 2.5 (baseline 2.0-2.4), WBC 7.4K, Hgb 11.3 -UA was grossly bloody -CXR was clear -CT abdomen showed chronic hydronephrosis, displaced left nephrostomy tube -He was given ceftriaxone and TRH were asked to evaluate for hematuria, displaced tube       ROS: Review of Systems  Constitutional: Positive for fever and malaise/fatigue.  Gastrointestinal: Negative for nausea and vomiting.  Genitourinary: Positive for hematuria.  Neurological: Positive for weakness.  All other systems reviewed and are negative.         Past Medical History:  Diagnosis Date  . Acute unilateral obstructive uropathy   . Chronic kidney disease, stage IV (severe) ([place])   . Constipation   . Coronary atherosclerosis of native coronary artery   . Diabetes mellitus without complication ([place])   . Enlarged prostate with urinary  obstruction   . Essential hypertension, malignant   . GERD (gastroesophageal reflux disease)   . Glaucoma   . Hemiparesis affecting right side as late effect of cerebrovascular accident ([place])   . Hyperlipidemia   . Hypertension   . Hypokalemia   . Muscle weakness (generalized)   . Retroperitoneal fibrosis   . Stricture or kinking of ureter   . Stroke [facility])    right sided hemiparesis    Past Surgical History:  Procedure Laterality Date  . IR GENERIC HISTORICAL  11/09/2016   IR NEPHROSTOMY EXCHANGE LEFT 11/09/2016 Corrie Mckusick, DO [facility]-INTERV RAD  . IR GENERIC HISTORICAL  01/09/2017   IR NEPHROSTOMY EXCHANGE LEFT 01/09/2017 [name], MD [facility]-INTERV RAD  . IR NEPHROSTOMY EXCHANGE LEFT  03/06/2017  . IR NEPHROSTOMY EXCHANGE LEFT  04/13/2017  . IR NEPHROSTOMY EXCHANGE LEFT  06/08/2017  . IR NEPHROSTOMY EXCHANGE LEFT  07/20/2017  . NEPHROSTOMY TUBE PLACEMENT ([place] [facility]X)      Social History: Patient lives in a SNF.  The patient is wheelchair bound.  Nonsmoker.  From [place] originally.  Allergies  Allergen Reactions  . Other     TB serum-reaction unknown. Provided via [facility] records     Family history: Family history is unknown by patient.  Prior to Admission medications   Medication Sig Start Date End Date Taking? Authorizing Provider  acetaminophen (TYLENOL) 325 MG tablet Take 650 mg by mouth 3 (three) times daily as needed for mild pain or moderate pain.   Yes [provider]  calcium carbonate (  CALTRATE 600) 1500 (600 Ca) MG TABS tablet Take 1 tablet by mouth 2 (two) times daily.   Yes [provider]  Cholecalciferol 1000 units tablet Take 1,000 Units by mouth daily.    Yes [provider]  docusate sodium (COLACE) 100 MG capsule Take 100 mg by mouth daily.   Yes [provider]  furosemide (LASIX) 20 MG tablet Take 20 mg by mouth daily.   Yes [provider]  mycophenolate (CELLCEPT) 500 MG tablet Take 500 mg by mouth 2 (two) times daily.    Yes [provider]  Omega-3 Fatty Acids (FISH OIL) 1000 MG CAPS Take 1 capsule by mouth daily.   Yes [provider]  Probiotic Product (PROBIOTIC PO) Take 1 capsule by mouth daily.   Yes [provider]  rivaroxaban (XARELTO) 20 MG TABS tablet Take 20 mg by mouth daily.   Yes [provider]  senna (SENOKOT) 8.6 MG TABS tablet Take 1 tablet by mouth 2 (two) times daily.   Yes [provider]  simvastatin (ZOCOR) 40 MG tablet Take 40 mg by mouth at bedtime.   Yes [provider]  tamsulosin (FLOMAX) 0.4 MG CAPS capsule Take 0.4 mg by mouth at bedtime.   Yes [provider]  polyethylene glycol (MIRALAX / GLYCOLAX) packet Take 17 g by mouth daily as needed for moderate constipation.     [provider]       Physical Exam: BP 111/73 (BP Location: Left Arm)   Pulse 89   Temp 98.2 F (36.8 C) (Oral)   Resp 18   SpO2 100%  General appearance: Well-developed, adult male, alert and in no acute distress.   Eyes: Anicteric, conjunctiva pink, lids and lashes normal. PERRL.    ENT: No nasal deformity, discharge, epistaxis.  Hearing normal. OP moist without lesions.   Neck: No neck masses.  Trachea midline.  No thyromegaly/tenderness. Lymph: No cervical or supraclavicular lymphadenopathy. Skin: Warm and dry.  No suspicious rashes or lesions. Cardiac: Tachycardic, regular, nl S1-S2, no murmurs appreciated.  Capillary refill is brisk.  JVP not visible.  Right chronic LE edema.  Radial pulses 2+ and symmetric. Respiratory: Normal respiratory rate and rhythm.  CTAB without rales or wheezes. Abdomen: Abdomen soft.  No TTP. No ascites, distension, hepatosplenomegaly.   MSK: No deformities or effusions.  No cyanosis or clubbing.  Chronic contractures on the right arm and leg. Neuro: Cranial nerves normal. Speech is fluent.  Muscle strength normal on left, absent on right.    Psych: Sensorium intact and responding to questions,  attention normal.  Behavior appropriate.  Affect normal.  Judgment and insight appear normal.     Labs on Admission:  I have personally reviewed following labs and imaging studies: CBC:  Recent Labs Lab 08/03/17 0213  WBC 7.4  NEUTROABS 5.0  HGB 11.3*  HCT 34.8*  MCV 81.3  PLT 073   Basic Metabolic Panel:  Recent Labs Lab 08/03/17 0213  NA 139  K 4.4  CL 106  CO2 26  GLUCOSE 100*  BUN 35*  CREATININE 2.51*  CALCIUM 9.2   GFR: CrCl cannot be calculated (Unknown ideal weight.).  Liver Function Tests: No results for input(s): AST, ALT, ALKPHOS, BILITOT, PROT, ALBUMIN in the last 168 hours. No results for input(s): LIPASE, AMYLASE in the last 168 hours. No results for input(s): AMMONIA in the last 168 hours. Coagulation Profile: No results for input(s): INR, PROTIME in the last 168 hours. Cardiac Enzymes: No results  for input(s): CKTOTAL, CKMB, CKMBINDEX, TROPONINI in the last 168 hours. BNP (last 3 results) No results for input(s): PROBNP in the last 8760 hours. HbA1C: No results for input(s): HGBA1C in the last 72 hours. CBG: No results for input(s): GLUCAP in the last 168 hours. Lipid Profile: No results for input(s): CHOL, HDL, LDLCALC, TRIG, CHOLHDL, LDLDIRECT in the last 72 hours. Thyroid Function Tests: No results for input(s): TSH, T4TOTAL, FREET4, T3FREE, THYROIDAB in the last 72 hours. Anemia Panel: No results for input(s): VITAMINB12, FOLATE, FERRITIN, TIBC, IRON, RETICCTPCT in the last 72 hours. Sepsis Labs: Invalid input(s): PROCALCITONIN, LACTICIDVEN No results found for this or any previous visit (from the past 240 hour(s)).       Radiological Exams on Admission: Personally reviewed CT report; CXR personally reviewed, shows no focal opacity or edema: Ct Abdomen Pelvis Wo Contrast  Result Date: 08/03/2017 CLINICAL DATA:  Left nephrostomy replaced on 07/20/2017. Accidentally dislodged again 2 days ago. EXAM: CT ABDOMEN AND PELVIS WITHOUT  CONTRAST TECHNIQUE: Multidetector CT imaging of the abdomen and pelvis was performed following the standard protocol without IV contrast. COMPARISON:  07/20/2017, 06/08/2017. FINDINGS: Lower chest: Mild scarring in the right lung base. No acute findings. Hepatobiliary: Unremarkable unenhanced appearances of the liver, gallbladder and bile ducts. Pancreas: Unremarkable. No pancreatic ductal dilatation or surrounding inflammatory changes. Spleen: Normal in size without focal abnormality. Adrenals/Urinary Tract: Both adrenals are normal. Both kidneys are markedly hydronephrotic. Ureters are apparently occluded within para-aortic fibrotic-appearing tissue. Left nephrostomy catheter is withdrawn from its documented position of 07/20/2017 although it probably still resides within the collecting system. It was well within the renal pelvis on the final image of 07/20/2017. It now probably resides within a posterior calyx of the lower pole. Urinary bladder is nearly empty. Stomach/Bowel: Unremarkable stomach, small bowel and colon. Vascular/Lymphatic: Infrarenal abdominal aortic aneurysm measuring 3.5 cm. Surrounding fibrotic appearing soft tissue. Reproductive: Unremarkable Other: No ascites. Musculoskeletal: No significant skeletal lesion. IMPRESSION: 1. The left nephrostomy catheter is no longer within the left renal pelvis. It probably is within a posterior lower pole calyx of the left kidney, withdrawn from its documented position on 07/20/2017. 2. Soft tissue around the infrarenal abdominal aortic aneurysm, with appearances typical of retroperitoneal fibrosis. Both kidneys are markedly hydronephrotic. Right kidney is atrophic. 3. No acute findings are evident in the abdomen or pelvis. Electronically Signed   By: Andreas Newport M.D.   On: 08/03/2017 04:28   Dg Chest Port 1 View  Result Date: 08/03/2017 CLINICAL DATA:  Sepsis EXAM: PORTABLE CHEST 1 VIEW COMPARISON:  02/16/2017 FINDINGS: The heart size and  mediastinal contours are within normal limits. Both lungs are clear. The visualized skeletal structures are unremarkable. IMPRESSION: No active disease. Electronically Signed   By: Donavan Foil M.D.   On: 08/03/2017 02:57        Assessment/Plan  1. Displaced nephrostomy tube with gross hematuria:  Recommend fluoroscopic guidance for replacement of percutaneous nephrostomy tube.   -Consult IR -Will cover empirically with Ceftriaxone given fever, malaise -Follow urine culture -Hold Xarelto for now   2. Chronic kidney disease stage IV:  At baseline, Cr 2.4  3. History of stroke:  -Hold Xarelto for now  4. Hypertension:  -Hold furosemide 1 day while on IV fluids  5. Retroperitoneal fibrosis:  -Continue Cellcept  6. Other medications:  -Continue Flomax -Continue Zocor      DVT prophylaxis: SCDs  Code Status: FULL  Family Communication: None present  Disposition Plan: Anticipate IV fluids, empiric  antibiotics, consult to IR for neph tube replacement.  Monitor for ongoing bleeding. Consults called: None overnight Admission status: OBS At the point of initial evaluation, it is my clinical opinion that admission for OBSERVATION is reasonable and necessary because the patient's presenting complaints in the context of their chronic conditions represent sufficient risk of deterioration or significant morbidity to constitute reasonable grounds for close observation in the hospital setting, but that the patient may be medically stable for discharge from the hospital within 24 to 48 hours.    Medical decision making: Patient seen at 5:50 AM on 08/03/2017.  The patient was discussed with Dr. Claudine Mouton.  What exists of the patient's chart was reviewed in depth and summarized above.  Clinical condition: stable.        Edwin Dada [facility] Pager [phone]

## 2017-08-03 NOTE — ED Provider Notes (Signed)
[place] [facility] DEPT Provider Note   CSN: [id number] Arrival date & time: 08/03/17  0144     History   Chief Complaint Chief Complaint  Patient presents with  . Fever    HPI Brendan [name] is a 56 y.o. male with a past medical history of chronic kidney disease, obstructive uropathy status post nephrostomy tube chronic on the left, presenting today with fever. Patient states he has felt febrile and weak recently. He denies any coughing, vomiting, diarrhea. He states 2 days ago his nephrostomy tube was pulled out. They were able to insert it back in and ever since it has been draining blood. This is when his fever started as well. By report, there is swelling noted around the nephrostomy site. There are no further complaints.  10 Systems reviewed and are negative for acute change except as noted in the HPI.   HPI  Past Medical History:  Diagnosis Date  . Acute unilateral obstructive uropathy   . Chronic kidney disease, stage IV (severe) ([place])   . Constipation   . Coronary atherosclerosis of native coronary artery   . Diabetes mellitus without complication ([place])   . Enlarged prostate with urinary obstruction   . Essential hypertension, malignant   . GERD (gastroesophageal reflux disease)   . Glaucoma   . Hemiparesis affecting right side as late effect of cerebrovascular accident ([place])   . Hyperlipidemia   . Hypertension   . Hypokalemia   . Muscle weakness (generalized)   . Retroperitoneal fibrosis   . Stricture or kinking of ureter   . Stroke [facility])    right sided hemiparesis    Patient Active Problem List   Diagnosis Date Noted  . Nephrostomy tube displaced ([place]) 07/19/2017  . Pyelonephritis 02/16/2017  . 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 Coronary atherosclerosis of native coronary artery   . Hypertension   . Stroke ([place])   . Diabetes mellitus without complication ([place])   . Chronic kidney disease, stage IV (severe) ([place])   . GERD (gastroesophageal reflux disease)     . Glaucoma   . Hemiparesis affecting right side as late effect of cerebrovascular accident ([place])   . Enlarged prostate with urinary obstruction   . Constipation     Past Surgical History:  Procedure Laterality Date  . IR GENERIC HISTORICAL  11/09/2016   IR NEPHROSTOMY EXCHANGE LEFT 11/09/2016 Corrie Mckusick, DO [facility]-INTERV RAD  . IR GENERIC HISTORICAL  01/09/2017   IR NEPHROSTOMY EXCHANGE LEFT 01/09/2017 [name], MD [facility]-INTERV RAD  . IR NEPHROSTOMY EXCHANGE LEFT  03/06/2017  . IR NEPHROSTOMY EXCHANGE LEFT  04/13/2017  . IR NEPHROSTOMY EXCHANGE LEFT  06/08/2017  . IR NEPHROSTOMY EXCHANGE LEFT  07/20/2017  . NEPHROSTOMY TUBE PLACEMENT ([place] HX)         Home Medications    Prior to Admission medications   Medication Sig Start Date End Date Taking? Authorizing Provider  acetaminophen (TYLENOL) 325 MG tablet Take 650 mg by mouth 3 (three) times daily as needed for mild pain or moderate pain.   Yes [provider]  calcium carbonate (CALTRATE 600) 1500 (600 Ca) MG TABS tablet Take 1 tablet by mouth 2 (two) times daily.   Yes [provider]  Cholecalciferol 1000 units tablet Take 1,000 Units by mouth daily.    Yes [provider]  docusate sodium (COLACE) 100 MG capsule Take 100 mg by mouth daily.   Yes [provider]  furosemide (LASIX) 20 MG tablet Take 20 mg by mouth daily.  Yes [provider]  mycophenolate (CELLCEPT) 500 MG tablet Take 500 mg by mouth 2 (two) times daily.   Yes [provider]  Omega-3 Fatty Acids (FISH OIL) 1000 MG CAPS Take 1 capsule by mouth daily.   Yes [provider]  Probiotic Product (PROBIOTIC PO) Take 1 capsule by mouth daily.   Yes [provider]  rivaroxaban (XARELTO) 20 MG TABS tablet Take 20 mg by mouth daily.   Yes [provider]  senna (SENOKOT) 8.6 MG TABS tablet Take 1 tablet by mouth 2 (two) times daily.   Yes [provider]  simvastatin (ZOCOR) 40 MG tablet  Take 40 mg by mouth at bedtime.   Yes [provider]  tamsulosin (FLOMAX) 0.4 MG CAPS capsule Take 0.4 mg by mouth at bedtime.   Yes [provider]  furosemide (LASIX) 40 MG tablet Take 1 tablet (40 mg total) by mouth daily as needed for fluid or edema. Patient not taking: Reported on 08/03/2017 07/21/17   Lavina Hamman, MD  polyethylene glycol [facility] / Floria Raveling) packet Take 17 g by mouth daily as needed for moderate constipation.     [provider]    Family History No family history on file.  Social History Social History  Substance Use Topics  . Smoking status: Never Smoker  . Smokeless tobacco: Never Used  . Alcohol use Not on file     Allergies   Other   Review of Systems Review of Systems   Physical Exam Updated Vital Signs BP 127/82 (BP Location: Left Arm)   Pulse 100   Temp (!) 100.4 F (38 C) (Rectal)   Resp 18   SpO2 98%   Physical Exam  Constitutional: He is oriented to person, place, and time. Vital signs are normal. He appears well-developed and well-nourished.  Non-toxic appearance. He does not appear ill. No distress.  HENT:  Head: Normocephalic and atraumatic.  Nose: Nose normal.  Mouth/Throat: Oropharynx is clear and moist. No oropharyngeal exudate.  Eyes: Pupils are equal, round, and reactive to light. Conjunctivae and EOM are normal. No scleral icterus.  Neck: Normal range of motion. Neck supple. No tracheal deviation, no edema, no erythema and normal range of motion present. No thyroid mass and no thyromegaly present.  Cardiovascular: Normal rate, regular rhythm, S1 normal, S2 normal, normal heart sounds, intact distal pulses and normal pulses.  Exam reveals no gallop and no friction rub.   No murmur heard. Pulmonary/Chest: Effort normal and breath sounds normal. No respiratory distress. He has no wheezes. He has no rhonchi. He has no rales.  Abdominal: Soft. Normal appearance and bowel sounds are normal. He exhibits  no distension, no ascites and no mass. There is no hepatosplenomegaly. There is no tenderness. There is no rebound, no guarding and no CVA tenderness.  L nephrostomy tube seen, no swelling erythema, or purulence seen around the site.  No TTP. The tube is secured in place  Musculoskeletal: Normal range of motion. He exhibits edema. He exhibits no tenderness.  Lymphadenopathy:    He has no cervical adenopathy.  Neurological: He is alert and oriented to person, place, and time. He has normal strength.  Skin: Skin is warm, dry and intact. No petechiae and no rash noted. He is not diaphoretic. No erythema. No pallor.  Nursing note and vitals reviewed.    ED Treatments / Results  Labs (all labs ordered are listed, but only abnormal results are displayed) Labs Reviewed  CBC WITH DIFFERENTIAL/PLATELET -  Abnormal; Notable for the following:       Result Value   Hemoglobin 11.3 (*)    HCT 34.8 (*)    RDW 17.6 (*)    All other components within normal limits  URINALYSIS, ROUTINE W REFLEX MICROSCOPIC - Abnormal; Notable for the following:    Color, Urine RED (*)    APPearance TURBID (*)    Glucose, UA   (*)    Value: TEST NOT REPORTED DUE TO COLOR INTERFERENCE OF URINE PIGMENT   Hgb urine dipstick   (*)    Value: TEST NOT REPORTED DUE TO COLOR INTERFERENCE OF URINE PIGMENT   Bilirubin Urine   (*)    Value: TEST NOT REPORTED DUE TO COLOR INTERFERENCE OF URINE PIGMENT   Ketones, ur   (*)    Value: TEST NOT REPORTED DUE TO COLOR INTERFERENCE OF URINE PIGMENT   Protein, ur   (*)    Value: TEST NOT REPORTED DUE TO COLOR INTERFERENCE OF URINE PIGMENT   Nitrite   (*)    Value: TEST NOT REPORTED DUE TO COLOR INTERFERENCE OF URINE PIGMENT   Leukocytes, UA   (*)    Value: TEST NOT REPORTED DUE TO COLOR INTERFERENCE OF URINE PIGMENT   All other components within normal limits  BASIC METABOLIC PANEL - Abnormal; Notable for the following:    Glucose, Bld 100 (*)    BUN 35 (*)    Creatinine, Ser 2.51  (*)    GFR calc non Af Amer 27 (*)    GFR calc Af Amer 31 (*)    All other components within normal limits  URINALYSIS, MICROSCOPIC (REFLEX) - Abnormal; Notable for the following:    Bacteria, UA FIELD OBSCURED BY RBC'S (*)    Squamous Epithelial / LPF FIELD OBSCURED BY RBC'S (*)    All other components within normal limits  CULTURE, BLOOD (ROUTINE X 2)  CULTURE, BLOOD (ROUTINE X 2)    EKG  EKG Interpretation None       Radiology Ct Abdomen Pelvis Wo Contrast  Result Date: 08/03/2017 CLINICAL DATA:  Left nephrostomy replaced on 07/20/2017. Accidentally dislodged again 2 days ago. EXAM: CT ABDOMEN AND PELVIS WITHOUT CONTRAST TECHNIQUE: Multidetector CT imaging of the abdomen and pelvis was performed following the standard protocol without IV contrast. COMPARISON:  07/20/2017, 06/08/2017. FINDINGS: Lower chest: Mild scarring in the right lung base. No acute findings. Hepatobiliary: Unremarkable unenhanced appearances of the liver, gallbladder and bile ducts. Pancreas: Unremarkable. No pancreatic ductal dilatation or surrounding inflammatory changes. Spleen: Normal in size without focal abnormality. Adrenals/Urinary Tract: Both adrenals are normal. Both kidneys are markedly hydronephrotic. Ureters are apparently occluded within para-aortic fibrotic-appearing tissue. Left nephrostomy catheter is withdrawn from its documented position of 07/20/2017 although it probably still resides within the collecting system. It was well within the renal pelvis on the final image of 07/20/2017. It now probably resides within a posterior calyx of the lower pole. Urinary bladder is nearly empty. Stomach/Bowel: Unremarkable stomach, small bowel and colon. Vascular/Lymphatic: Infrarenal abdominal aortic aneurysm measuring 3.5 cm. Surrounding fibrotic appearing soft tissue. Reproductive: Unremarkable Other: No ascites. Musculoskeletal: No significant skeletal lesion. IMPRESSION: 1. The left nephrostomy catheter is no  longer within the left renal pelvis. It probably is within a posterior lower pole calyx of the left kidney, withdrawn from its documented position on 07/20/2017. 2. Soft tissue around the infrarenal abdominal aortic aneurysm, with appearances typical of retroperitoneal fibrosis. Both kidneys are markedly hydronephrotic. Right kidney is atrophic. 3. No  acute findings are evident in the abdomen or pelvis. Electronically Signed   By: Andreas Newport M.D.   On: 08/03/2017 04:28   Dg Chest Port 1 View  Result Date: 08/03/2017 CLINICAL DATA:  Sepsis EXAM: PORTABLE CHEST 1 VIEW COMPARISON:  02/16/2017 FINDINGS: The heart size and mediastinal contours are within normal limits. Both lungs are clear. The visualized skeletal structures are unremarkable. IMPRESSION: No active disease. Electronically Signed   By: Donavan Foil M.D.   On: 08/03/2017 02:57    Procedures Procedures (including critical care time)  Medications Ordered in ED Medications  acetaminophen (TYLENOL) tablet 1,000 mg (not administered)     Initial Impression / Assessment and Plan / ED Course  I have reviewed the triage vital signs and the nursing notes.  Pertinent labs & imaging results that were available during my care of the patient were reviewed by me and considered in my medical decision making (see chart for details).     Patient presents to the ED for fever. Will obtain urine studies, CXR and labs for evaluation.  Rectal temp is pending. VS are normal. Will continue to closely monitor.  2:34 AM Temp is 100.4, he is febrile. He was given tylenol.  Labs are still pending. CXR neg for acute infection. Still waiting on urine.  Given fever, will obtain CT of the abdomen to eval for any fluid collection around nephrostomy site.  4:34 AM Tylenol was ordered 1 hour ago and still not given.  Nursing encouraged to give.  UA results are frank blood and CT confirms tube is in the parenchyma of the kidney.  Plan to admit for IR  replacement. Hospitalist have been paged. Will also discuss possible abx given fever in the ED.  Final Clinical Impressions(s) / ED Diagnoses   Final diagnoses:  Nephrostomy complication ([place])  Fever, unspecified fever cause    New Prescriptions New Prescriptions   No medications on file     Everlene Balls, MD 08/03/17 [phone]

## 2017-08-03 NOTE — Progress Notes (Signed)
PROGRESS NOTE    Brendan [name]  WPY:[id number] DOB: 1961/10/31 DOA: 08/03/2017 PCP: Cleda Mccreedy, MD    Brief Narrative:  56 y.o. 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  The patient was in his usual state of health until 2 days ago when nursing aide was helping him roll over in bed, and his nephrostomy tube was dislodged. Staff at his facility tried to replace the nephrostomy tube at the bedside, unfortunately they didn't use fluoroscopy, and ever since it has been draining blood, he has had an intermittent fever, and has had progressive malaise, lack of appetite and weakness.  Today, he felt worse, so he was brought to the ER.  Assessment & Plan:   Principal Problem:   Nephrostomy tube displaced [facility]) Active Problems:   Essential hypertension   Chronic kidney disease, stage IV (severe) (HCC)   Hemiparesis affecting right side as late effect of cerebrovascular accident [facility])   Pyelonephritis   Retroperitoneal fibrosis  1. Displaced nephrostomy tube with gross hematuria:  - Recommend fluoroscopic guidance for replacement of percutaneous nephrostomy tube.   -IR consulted. Patient has since had replacement of L nephrostomy tube -Pt continued on Ceftriaxone given presenting fever, malaise -Urine culture pending -Xarelto presently on hold for now  2. Chronic kidney disease stage IV:  At baseline, Cr 2.4 -Stable at present  3. History of stroke:  -Hold Xarelto for now per above  4. Hypertension:  -BP stable at present - Cont current regimen  5. Retroperitoneal fibrosis:  -Continue Cellcept as tolerated  6. Other medications:  -Continue Flomax as tolerated -Continue Zocor  DVT prophylaxis: SCD's Code Status: Full Family Communication: Pt  in room, family not at bedside Disposition Plan: Uncertain at this time  Consultants:   IR  Procedures:     Antimicrobials: Anti-infectives    Start     Dose/Rate Route Frequency Ordered Stop   08/04/17 0600  cefTRIAXone (ROCEPHIN) 1 g in dextrose 5 % 50 mL IVPB     1 g 100 mL/hr over 30 Minutes Intravenous Every 24 hours 08/03/17 0514     08/03/17 0500  cefTRIAXone (ROCEPHIN) 1 g in dextrose 5 % 50 mL IVPB     1 g 100 mL/hr over 30 Minutes Intravenous  Once 08/03/17 0450 08/03/17 0622       Subjective: Feels better today  Objective: Vitals:   08/03/17 1057 08/03/17 1247 08/03/17 1330 08/03/17 1605  BP: 124/90 132/90 (!) 141/97 137/88  Pulse: 88 88 92 96  Resp: 18 16 17 18   Temp: 98.3 F (36.8 C) 98.3 F (36.8 C) 98.6 F (37 C) 98.3 F (36.8 C)  TempSrc: Oral Oral Oral Oral  SpO2: 100% 100% 99% 100%  Weight:  93 kg (205 lb)    Height:  6\' 1"  (1.854 m)      Intake/Output Summary (Last 24 hours) at 08/03/17 1645 Last data filed at 08/03/17 1606  Gross per 24 hour  Intake              400 ml  Output              950 ml  Net             -550 ml   Filed Weights   08/03/17 1247  Weight: 93 kg (205 lb)  Examination:  General exam: Appears calm and comfortable  Respiratory system: Clear to auscultation. Respiratory effort normal. Cardiovascular system: S1 & S2 heard, RRR Gastrointestinal system: Abdomen is nondistended, soft and nontender. No organomegaly or masses felt. Normal bowel sounds heard. Central nervous system: Alert and oriented. No focal neurological deficits. Extremities: Symmetric 5 x 5 power. Skin: No rashes, lesions Psychiatry: Judgement and insight appear normal. Mood & affect appropriate.   Data Reviewed: I have personally reviewed following labs and imaging studies  CBC:  Recent Labs Lab 08/03/17 0213  WBC 7.4  NEUTROABS 5.0  HGB 11.3*  HCT 34.8*  MCV 81.3  PLT 563   Basic Metabolic Panel:  Recent Labs Lab 08/03/17 0213   NA 139  K 4.4  CL 106  CO2 26  GLUCOSE 100*  BUN 35*  CREATININE 2.51*  CALCIUM 9.2   GFR: Estimated Creatinine Clearance: 36.7 mL/min (A) (by C-G formula based on SCr of 2.51 mg/dL (H)). Liver Function Tests: No results for input(s): AST, ALT, ALKPHOS, BILITOT, PROT, ALBUMIN in the last 168 hours. No results for input(s): LIPASE, AMYLASE in the last 168 hours. No results for input(s): AMMONIA in the last 168 hours. Coagulation Profile: No results for input(s): INR, PROTIME in the last 168 hours. Cardiac Enzymes: No results for input(s): CKTOTAL, CKMB, CKMBINDEX, TROPONINI in the last 168 hours. BNP (last 3 results) No results for input(s): PROBNP in the last 8760 hours. HbA1C: No results for input(s): HGBA1C in the last 72 hours. CBG: No results for input(s): GLUCAP in the last 168 hours. Lipid Profile: No results for input(s): CHOL, HDL, LDLCALC, TRIG, CHOLHDL, LDLDIRECT in the last 72 hours. Thyroid Function Tests: No results for input(s): TSH, T4TOTAL, FREET4, T3FREE, THYROIDAB in the last 72 hours. Anemia Panel: No results for input(s): VITAMINB12, FOLATE, FERRITIN, TIBC, IRON, RETICCTPCT in the last 72 hours. Sepsis Labs: No results for input(s): PROCALCITON, LATICACIDVEN in the last 168 hours.  No results found for this or any previous visit (from the past 240 hour(s)).   Radiology Studies: Ct Abdomen Pelvis Wo Contrast  Result Date: 08/03/2017 CLINICAL DATA:  Left nephrostomy replaced on 07/20/2017. Accidentally dislodged again 2 days ago. EXAM: CT ABDOMEN AND PELVIS WITHOUT CONTRAST TECHNIQUE: Multidetector CT imaging of the abdomen and pelvis was performed following the standard protocol without IV contrast. COMPARISON:  07/20/2017, 06/08/2017. FINDINGS: Lower chest: Mild scarring in the right lung base. No acute findings. Hepatobiliary: Unremarkable unenhanced appearances of the liver, gallbladder and bile ducts. Pancreas: Unremarkable. No pancreatic ductal  dilatation or surrounding inflammatory changes. Spleen: Normal in size without focal abnormality. Adrenals/Urinary Tract: Both adrenals are normal. Both kidneys are markedly hydronephrotic. Ureters are apparently occluded within para-aortic fibrotic-appearing tissue. Left nephrostomy catheter is withdrawn from its documented position of 07/20/2017 although it probably still resides within the collecting system. It was well within the renal pelvis on the final image of 07/20/2017. It now probably resides within a posterior calyx of the lower pole. Urinary bladder is nearly empty. Stomach/Bowel: Unremarkable stomach, small bowel and colon. Vascular/Lymphatic: Infrarenal abdominal aortic aneurysm measuring 3.5 cm. Surrounding fibrotic appearing soft tissue. Reproductive: Unremarkable Other: No ascites. Musculoskeletal: No significant skeletal lesion. IMPRESSION: 1. The left nephrostomy catheter is no longer within the left renal pelvis. It probably is within a posterior lower pole calyx of the left kidney, withdrawn from its documented position on 07/20/2017. 2. Soft tissue around the infrarenal abdominal aortic aneurysm, with appearances typical of retroperitoneal fibrosis. Both kidneys are markedly hydronephrotic. Right kidney  is atrophic. 3. No acute findings are evident in the abdomen or pelvis. Electronically Signed   By: Andreas Newport M.D.   On: 08/03/2017 04:28   Dg Chest Port 1 View  Result Date: 08/03/2017 CLINICAL DATA:  Sepsis EXAM: PORTABLE CHEST 1 VIEW COMPARISON:  02/16/2017 FINDINGS: The heart size and mediastinal contours are within normal limits. Both lungs are clear. The visualized skeletal structures are unremarkable. IMPRESSION: No active disease. Electronically Signed   By: Donavan Foil M.D.   On: 08/03/2017 02:57    Scheduled Meds: . docusate sodium  100 mg Oral Daily  . lidocaine (PF)      . mycophenolate  500 mg Oral BID  . senna  1 tablet Oral BID  . simvastatin  40 mg Oral QHS   . tamsulosin  0.4 mg Oral QHS   Continuous Infusions: . [START ON 08/04/2017] cefTRIAXone (ROCEPHIN)  IV       LOS: 0 days   Jamorris Ndiaye, Orpah Melter, MD [facility] Pager [phone]  If 7PM-7AM, please contact night-coverage [email or web address] Password [email or web address] 08/03/2017, 4:45 PM

## 2017-08-03 NOTE — Progress Notes (Signed)
Patient transported to IR 

## 2017-08-04 DIAGNOSIS — T83022A Displacement of nephrostomy catheter, initial encounter: Secondary | ICD-10-CM | POA: Diagnosis not present

## 2017-08-04 LAB — BASIC METABOLIC PANEL
Anion gap: 10 (ref 5–15)
BUN: 32 mg/dL — AB (ref 6–20)
CO2: 22 mmol/L (ref 22–32)
CREATININE: 2.33 mg/dL — AB (ref 0.61–1.24)
Calcium: 8.7 mg/dL — ABNORMAL LOW (ref 8.9–10.3)
Chloride: 107 mmol/L (ref 101–111)
GFR calc Af Amer: 34 mL/min — ABNORMAL LOW (ref >60)
GFR calc non Af Amer: 29 mL/min — ABNORMAL LOW (ref >60)
Glucose, Bld: 90 mg/dL (ref 65–99)
Potassium: 4.4 mmol/L (ref 3.5–5.1)
Sodium: 139 mmol/L (ref 135–145)

## 2017-08-04 LAB — CBC
HCT: 33.5 % — ABNORMAL LOW (ref 39.0–52.0)
Hemoglobin: 10.7 g/dL — ABNORMAL LOW (ref 13.0–17.0)
MCH: 26.2 pg (ref 26.0–34.0)
MCHC: 31.9 g/dL (ref 30.0–36.0)
MCV: 81.9 fL (ref 78.0–100.0)
PLATELETS: 146 10*3/uL — AB (ref 150–400)
RBC: 4.09 MIL/uL — ABNORMAL LOW (ref 4.22–5.81)
RDW: 17.7 % — AB (ref 11.5–15.5)
WBC: 6.6 10*3/uL (ref 4.0–10.5)

## 2017-08-04 MED ORDER — CEFUROXIME AXETIL 250 MG PO TABS: 250.0000 mg | ORAL_TABLET | Freq: Two times a day (BID) | ORAL | 0 refills | Status: DC

## 2017-08-04 NOTE — Discharge Summary (Signed)
Physician Discharge Summary  Brendan [name] KVQ:[id number] DOB: 1961-10-30 DOA: 08/03/2017  PCP: Cleda Mccreedy, MD  Admit date: 08/03/2017 Discharge date: 08/04/2017  Admitted From: [place] Disposition:  [place]  Recommendations for Outpatient Follow-up:  1. Follow up with PCP in 1-2 weeks 2. 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  The patient was in his usual state of health until 2 days ago when nursing aide was helping him roll over in bed, and his nephrostomy tube was dislodged. Staff at hisfacility tried toreplace thenephrostomy tube at the bedside, unfortunately they didn't use fluoroscopy, and ever since it has been draining blood, he has had an intermittent fever, and has had progressive malaise, lack of appetite and weakness.  1. Displaced nephrostomy tube with gross hematuria: -IR was consulted. Patient has since had replacement of L nephrostomy tube on 08/03/17 -Pt continued on Ceftriaxone given presenting fever, afebrile remainder of this admission. Patient to complete course of ceftin on return to facility (6 more days of abx) -Urine culture remains pending, although pt is clinically responding to rocephin -Xarelto initially on hold secondary to mild hematuria. To resume on return to facility  2. Chronic kidney disease stage IV: At baseline, Cr 2.4 -Stable at present -Remained at baseline  3. History of stroke: -Initially held anticoagulation per above -Pt to resume xarelto on return to facility  4. Hypertension: -BP stable at present - Cont current regimen  5.  Retroperitoneal fibrosis: -Continue Cellcept as tolerated  6. Other medications: -Continue Flomax as tolerated -Continue Zocor  Discharge Diagnoses:  Principal Problem:   Nephrostomy tube displaced [facility]) Active Problems:   Essential hypertension   Chronic kidney disease, stage IV (severe) (HCC)   Hemiparesis affecting right side as late effect of cerebrovascular accident [facility])   Pyelonephritis   Retroperitoneal fibrosis    Discharge Instructions   Allergies as of 08/04/2017      Reactions   Other    TB serum-reaction unknown. Provided via [facility] records      Medication List    TAKE these medications   acetaminophen 325 MG tablet Commonly known as:  TYLENOL Take 650 mg by mouth 3 (three) times daily as needed for mild pain or moderate pain.   CALTRATE 600 1500 (600 Ca) MG Tabs tablet Generic drug:  calcium carbonate Take 1 tablet by mouth 2 (two) times daily.   cefUROXime 250 MG tablet Commonly known as:  CEFTIN Take 1 tablet (250 mg total) by mouth 2 (two) times daily with a meal.   Cholecalciferol 1000 units tablet Take 1,000 Units by mouth daily.   docusate sodium 100 MG capsule Commonly known as:  COLACE Take 100 mg by mouth daily.   Fish Oil 1000 MG Caps Take 1 capsule by mouth daily.   furosemide 20 MG tablet Commonly known as:  LASIX Take 20 mg by mouth daily.   mycophenolate 500 MG tablet Commonly known as:  CELLCEPT Take 500 mg by mouth 2 (two) times daily.   polyethylene glycol packet Commonly known as:  MIRALAX / GLYCOLAX Take 17 g by mouth daily as needed for moderate constipation.   PROBIOTIC PO Take 1 capsule by mouth daily.   rivaroxaban 20 MG Tabs tablet Commonly  known as:  XARELTO Take 20 mg by mouth daily.   senna 8.6 MG Tabs tablet Commonly known as:  SENOKOT Take 1 tablet by mouth 2 (two) times daily.   simvastatin 40 MG tablet Commonly known as:  ZOCOR Take 40 mg by mouth at bedtime.   tamsulosin 0.4 MG Caps  capsule Commonly known as:  FLOMAX Take 0.4 mg by mouth at bedtime.            Discharge Care Instructions        Start     Ordered   08/04/17 0000  cefUROXime (CEFTIN) 250 MG tablet  2 times daily with meals     08/04/17 0859     Follow-up Information    Cleda Mccreedy, MD. Schedule an appointment as soon as possible for a visit in 1 week(s).   Specialty:  Internal Medicine Contact information: [place] [address] [phone]          Allergies  Allergen Reactions  . Other     TB serum-reaction unknown. Provided via [facility] records     Consultations:  IR  Procedures/Studies: Ct Abdomen Pelvis Wo Contrast  Result Date: 08/03/2017 CLINICAL DATA:  Left nephrostomy replaced on 07/20/2017. Accidentally dislodged again 2 days ago. EXAM: CT ABDOMEN AND PELVIS WITHOUT CONTRAST TECHNIQUE: Multidetector CT imaging of the abdomen and pelvis was performed following the standard protocol without IV contrast. COMPARISON:  07/20/2017, 06/08/2017. FINDINGS: Lower chest: Mild scarring in the right lung base. No acute findings. Hepatobiliary: Unremarkable unenhanced appearances of the liver, gallbladder and bile ducts. Pancreas: Unremarkable. No pancreatic ductal dilatation or surrounding inflammatory changes. Spleen: Normal in size without focal abnormality. Adrenals/Urinary Tract: Both adrenals are normal. Both kidneys are markedly hydronephrotic. Ureters are apparently occluded within para-aortic fibrotic-appearing tissue. Left nephrostomy catheter is withdrawn from its documented position of 07/20/2017 although it probably still resides within the collecting system. It was well within the renal pelvis on the final image of 07/20/2017. It now probably resides within a posterior calyx of the lower pole. Urinary bladder is nearly empty. Stomach/Bowel: Unremarkable stomach, small bowel and colon. Vascular/Lymphatic: Infrarenal abdominal aortic aneurysm measuring 3.5 cm.  Surrounding fibrotic appearing soft tissue. Reproductive: Unremarkable Other: No ascites. Musculoskeletal: No significant skeletal lesion. IMPRESSION: 1. The left nephrostomy catheter is no longer within the left renal pelvis. It probably is within a posterior lower pole calyx of the left kidney, withdrawn from its documented position on 07/20/2017. 2. Soft tissue around the infrarenal abdominal aortic aneurysm, with appearances typical of retroperitoneal fibrosis. Both kidneys are markedly hydronephrotic. Right kidney is atrophic. 3. No acute findings are evident in the abdomen or pelvis. Electronically Signed   By: Andreas Newport M.D.   On: 08/03/2017 04:28   Dg Chest Port 1 View  Result Date: 08/03/2017 CLINICAL DATA:  Sepsis EXAM: PORTABLE CHEST 1 VIEW COMPARISON:  02/16/2017 FINDINGS: The heart size and mediastinal contours are within normal limits. Both lungs are clear. The visualized skeletal structures are unremarkable. IMPRESSION: No active disease. Electronically Signed   By: Donavan Foil M.D.   On: 08/03/2017 02:57   Ir Nephrostomy Exchange Left  Result Date: 07/20/2017 INDICATION: Retroperitoneal fibrosis, chronic obstructive left hydronephrosis, left nephrostomy accidentally removed EXAM: Fluoroscopic replacement of the left 10 French nephrostomy COMPARISON:  06/08/2017 MEDICATIONS: Patient is already receiving Rocephin as an inpatient ANESTHESIA/SEDATION: Fentanyl 50 mcg IV; Versed 2.0 mg IV Moderate Sedation Time:  10 minutes The patient was continuously monitored during the procedure by  the interventional radiology nurse under my direct supervision. CONTRAST:  10 cc Isovue 300 - administered into the collecting system(s) FLUOROSCOPY TIME:  Fluoroscopy Time: 1 minutes 18 seconds ( 28 mGy). COMPLICATIONS: None immediate. PROCEDURE: Informed written consent was obtained from the patient after a thorough discussion of the procedural risks, benefits and alternatives. All questions were  addressed. Maximal Sterile Barrier Technique was utilized including caps, mask, sterile gowns, sterile gloves, sterile drape, hand hygiene and skin antiseptic. A timeout was performed prior to the initiation of the procedure. Under sterile conditions, the existing left flank percutaneous tract was catheterized with a Kumpe catheter. Contrast injection confirms patency of the percutaneous tract. Catheter and guidewire advanced into the dilated left collecting system. Contrast injection confirms position of the catheter access within the kidney. Marked hydronephrosis evident. Bentson guidewire inserted followed by advancement of a 10 French nephrostomy. Retention loop formed in the renal pelvis. Position confirmed with fluoroscopy and contrast. Images obtained for documentation. Catheter secured with a Prolene suture and connected to external gravity drainage bag. Sterile dressing applied. No immediate complication. Patient tolerated the procedure well. IMPRESSION: Successful fluoroscopic replacement of the 10 French left nephrostomy through the existing percutaneous tract. Electronically Signed   By: Jerilynn Mages.  Shick M.D.   On: 07/20/2017 16:47    Subjective: No complaints  Discharge Exam: Vitals:   08/03/17 2040 08/04/17 0525  BP: (!) 132/94 115/82  Pulse: 97 93  Resp: 16 16  Temp: 98.8 F (37.1 C) 98.9 F (37.2 C)  SpO2: 100% 98%   Vitals:   08/03/17 1330 08/03/17 1605 08/03/17 2040 08/04/17 0525  BP: (!) 141/97 137/88 (!) 132/94 115/82  Pulse: 92 96 97 93  Resp: 17 18 16 16   Temp: 98.6 F (37 C) 98.3 F (36.8 C) 98.8 F (37.1 C) 98.9 F (37.2 C)  TempSrc: Oral Oral Oral Oral  SpO2: 99% 100% 100% 98%  Weight:      Height:        General: Pt is alert, awake, not in acute distress Cardiovascular: RRR, S1/S2 +, no rubs, no gallops Respiratory: CTA bilaterally, no wheezing, no rhonchi Abdominal: Soft, NT, ND, bowel sounds + Extremities: no edema, no cyanosis   The results of significant  diagnostics from this hospitalization (including imaging, microbiology, ancillary and laboratory) are listed below for reference.     Microbiology: No results found for this or any previous visit (from the past 240 hour(s)).   Labs: BNP (last 3 results) No results for input(s): BNP in the last 8760 hours. Basic Metabolic Panel:  Recent Labs Lab 08/03/17 0213 08/04/17 0437  NA 139 139  K 4.4 4.4  CL 106 107  CO2 26 22  GLUCOSE 100* 90  BUN 35* 32*  CREATININE 2.51* 2.33*  CALCIUM 9.2 8.7*   Liver Function Tests: No results for input(s): AST, ALT, ALKPHOS, BILITOT, PROT, ALBUMIN in the last 168 hours. No results for input(s): LIPASE, AMYLASE in the last 168 hours. No results for input(s): AMMONIA in the last 168 hours. CBC:  Recent Labs Lab 08/03/17 0213 08/04/17 0437  WBC 7.4 6.6  NEUTROABS 5.0  --   HGB 11.3* 10.7*  HCT 34.8* 33.5*  MCV 81.3 81.9  PLT 183 146*   Cardiac Enzymes: No results for input(s): CKTOTAL, CKMB, CKMBINDEX, TROPONINI in the last 168 hours. BNP: Invalid input(s): POCBNP CBG: No results for input(s): GLUCAP in the last 168 hours. D-Dimer No results for input(s): DDIMER in the last 72 hours. Hgb A1c No results  for input(s): HGBA1C in the last 72 hours. Lipid Profile No results for input(s): CHOL, HDL, LDLCALC, TRIG, CHOLHDL, LDLDIRECT in the last 72 hours. Thyroid function studies No results for input(s): TSH, T4TOTAL, T3FREE, THYROIDAB in the last 72 hours.  Invalid input(s): FREET3 Anemia work up No results for input(s): VITAMINB12, FOLATE, FERRITIN, TIBC, IRON, RETICCTPCT in the last 72 hours. Urinalysis    Component Value Date/Time   COLORURINE RED (A) 08/03/2017 0213   APPEARANCEUR TURBID (A) 08/03/2017 0213   LABSPEC  08/03/2017 0213    TEST NOT REPORTED DUE TO COLOR INTERFERENCE OF URINE PIGMENT   PHURINE  08/03/2017 0213    TEST NOT REPORTED DUE TO COLOR INTERFERENCE OF URINE PIGMENT   GLUCOSEU (A) 08/03/2017 0213    TEST  NOT REPORTED DUE TO COLOR INTERFERENCE OF URINE PIGMENT   HGBUR (A) 08/03/2017 0213    TEST NOT REPORTED DUE TO COLOR INTERFERENCE OF URINE PIGMENT   BILIRUBINUR (A) 08/03/2017 0213    TEST NOT REPORTED DUE TO COLOR INTERFERENCE OF URINE PIGMENT   KETONESUR (A) 08/03/2017 0213    TEST NOT REPORTED DUE TO COLOR INTERFERENCE OF URINE PIGMENT   PROTEINUR (A) 08/03/2017 0213    TEST NOT REPORTED DUE TO COLOR INTERFERENCE OF URINE PIGMENT   NITRITE (A) 08/03/2017 0213    TEST NOT REPORTED DUE TO COLOR INTERFERENCE OF URINE PIGMENT   LEUKOCYTESUR (A) 08/03/2017 0213    TEST NOT REPORTED DUE TO COLOR INTERFERENCE OF URINE PIGMENT   Sepsis Labs Invalid input(s): PROCALCITONIN,  WBC,  LACTICIDVEN Microbiology No results found for this or any previous visit (from the past 240 hour(s)).   SIGNED:   Donne Hazel, MD  [facility] 08/04/2017, 9:04 AM  If 7PM-7AM, please contact night-coverage [email or web address] Password [email or web address]

## 2017-08-04 NOTE — Clinical Social Work Note (Addendum)
Patient is a resident of [facility] in [place], [place]. He is observation status and came to hospital on 08/03/17.  Per MD- patient is stable for return to the facility today.  No FL2 is required.  Notified [facility], [employer] and notified them of patient's return to the facility.  DC summary and DC packet sent to facility via the [place].  Alice stated that she was not able to get into the [facility] and that there was no Admissions person there on the weekend. She will call Admissions at home to discuss.  RN to call report. Packet completed and EMS notified to transport. Patient is agreeable to d/c and requested that SW contact his mother in [place] to let her know he's ok and returning to facility.  SW spoke with patient's mother who was pleased with same. Asked that SW tell patient to "call me when he is able.".  Notified patient.  No further SW needs identified.  Fl2 not required as patient is Observation and only stayed overnight.  SW signing off.  Lorie Phenix. Pauline Good, [place] (weekend coverage)

## 2017-08-04 NOTE — Progress Notes (Signed)
Report called to Danton Clap, Therapist, sports at [facility]. Pt discharged with all belongings, no distress noted.

## 2017-08-08 LAB — CULTURE, BLOOD (ROUTINE X 2)
CULTURE: NO GROWTH
CULTURE: NO GROWTH
Special Requests: ADEQUATE
Special Requests: ADEQUATE

## 2017-08-24 ENCOUNTER — Encounter (HOSPITAL_COMMUNITY): Payer: Self-pay | Admitting: Interventional Radiology

## 2017-09-27 ENCOUNTER — Inpatient Hospital Stay (HOSPITAL_COMMUNITY): Admit: 2017-09-27 | Payer: Self-pay

## 2017-09-28 ENCOUNTER — Ambulatory Visit (HOSPITAL_COMMUNITY)
Admission: RE | Admit: 2017-09-28 | Discharge: 2017-09-28 | Disposition: A | Discharge status: Home / Self Care | Payer: Medicaid Other | Source: Ambulatory Visit | Attending: Urology | Admitting: Urology

## 2017-09-28 ENCOUNTER — Encounter (HOSPITAL_COMMUNITY): Payer: Self-pay | Admitting: Interventional Radiology

## 2017-09-28 ENCOUNTER — Other Ambulatory Visit: Payer: Self-pay | Admitting: Urology

## 2017-09-28 DIAGNOSIS — N1330 Unspecified hydronephrosis: Secondary | ICD-10-CM

## 2017-09-28 DIAGNOSIS — Z436 Encounter for attention to other artificial openings of urinary tract: Secondary | ICD-10-CM | POA: Insufficient documentation

## 2017-09-28 HISTORY — PX: IR NEPHROSTOMY EXCHANGE LEFT: IMG6069

## 2017-09-28 MED ORDER — IOPAMIDOL (ISOVUE-300) INJECTION 61%
10.0000 mL | Freq: Once | INTRAVENOUS | Status: AC | PRN
  Administered 2017-09-28: 10 mL

## 2017-09-28 MED ORDER — LIDOCAINE HCL 1 % IJ SOLN
INTRAMUSCULAR | Status: AC | PRN
  Administered 2017-09-28: 5 mL

## 2017-09-28 MED ORDER — LIDOCAINE HCL 1 % IJ SOLN
INTRAMUSCULAR | Status: AC
  Filled 2017-09-28: qty 20

## 2017-09-28 MED ORDER — IOPAMIDOL (ISOVUE-300) INJECTION 61%
INTRAVENOUS | Status: AC
  Administered 2017-09-28: 10 mL
  Filled 2017-09-28: qty 50

## 2017-10-09 ENCOUNTER — Ambulatory Visit (INDEPENDENT_AMBULATORY_CARE_PROVIDER_SITE_OTHER): Payer: Medicaid Other | Admitting: Urology

## 2017-10-09 DIAGNOSIS — N1330 Unspecified hydronephrosis: Secondary | ICD-10-CM | POA: Diagnosis not present

## 2017-11-29 ENCOUNTER — Ambulatory Visit (HOSPITAL_COMMUNITY)
Admission: RE | Admit: 2017-11-29 | Discharge: 2017-11-29 | Disposition: A | Discharge status: Home / Self Care | Payer: Medicaid Other | Source: Ambulatory Visit | Attending: Urology | Admitting: Urology

## 2017-11-29 ENCOUNTER — Other Ambulatory Visit: Payer: Self-pay | Admitting: Urology

## 2017-11-29 ENCOUNTER — Encounter (HOSPITAL_COMMUNITY): Payer: Self-pay | Admitting: Interventional Radiology

## 2017-11-29 DIAGNOSIS — Z436 Encounter for attention to other artificial openings of urinary tract: Secondary | ICD-10-CM | POA: Diagnosis present

## 2017-11-29 DIAGNOSIS — N1330 Unspecified hydronephrosis: Secondary | ICD-10-CM | POA: Insufficient documentation

## 2017-11-29 HISTORY — PX: IR NEPHROSTOMY EXCHANGE LEFT: IMG6069

## 2017-11-29 MED ORDER — IOPAMIDOL (ISOVUE-300) INJECTION 61%
INTRAVENOUS | Status: AC
  Administered 2017-11-29: 10 mL
  Filled 2017-11-29: qty 50

## 2017-11-29 MED ORDER — IOPAMIDOL (ISOVUE-300) INJECTION 61%
50.0000 mL | Freq: Once | INTRAVENOUS | Status: AC | PRN
  Administered 2017-11-29: 10 mL

## 2017-11-29 MED ORDER — LIDOCAINE HCL 1 % IJ SOLN
INTRAMUSCULAR | Status: AC
  Filled 2017-11-29: qty 20

## 2017-11-29 MED ORDER — LIDOCAINE HCL 1 % IJ SOLN
INTRAMUSCULAR | Status: DC | PRN
  Administered 2017-11-29: 5 mL via INTRADERMAL

## 2017-11-29 NOTE — Procedures (Signed)
Pre Procedure Dx: Hydronephrosis Post Procedure Dx: Same  Successful left sided PCN exchange.    EBL: None   No immediate complications.   Jay Jamien Casanova, MD Pager #: [phone]   

## 2018-01-31 ENCOUNTER — Ambulatory Visit (HOSPITAL_COMMUNITY)
Admission: RE | Admit: 2018-01-31 | Discharge: 2018-01-31 | Disposition: A | Discharge status: Home / Self Care | Payer: Medicaid Other | Source: Ambulatory Visit | Attending: Urology | Admitting: Urology

## 2018-01-31 ENCOUNTER — Other Ambulatory Visit: Payer: Self-pay | Admitting: Urology

## 2018-01-31 ENCOUNTER — Encounter (HOSPITAL_COMMUNITY): Payer: Self-pay | Admitting: Interventional Radiology

## 2018-01-31 DIAGNOSIS — N1330 Unspecified hydronephrosis: Secondary | ICD-10-CM | POA: Diagnosis not present

## 2018-01-31 DIAGNOSIS — Z436 Encounter for attention to other artificial openings of urinary tract: Secondary | ICD-10-CM | POA: Diagnosis not present

## 2018-01-31 HISTORY — PX: IR NEPHROSTOMY EXCHANGE LEFT: IMG6069

## 2018-01-31 MED ORDER — IOPAMIDOL (ISOVUE-300) INJECTION 61%
50.0000 mL | Freq: Once | INTRAVENOUS | Status: AC | PRN
  Administered 2018-01-31: 5 mL

## 2018-01-31 MED ORDER — LIDOCAINE HCL 1 % IJ SOLN
INTRAMUSCULAR | Status: AC
  Filled 2018-01-31: qty 20

## 2018-01-31 MED ORDER — IOPAMIDOL (ISOVUE-300) INJECTION 61%
INTRAVENOUS | Status: AC
  Administered 2018-01-31: 5 mL
  Filled 2018-01-31: qty 50

## 2018-01-31 NOTE — Procedures (Signed)
Pre Procedure Dx: Hydronephrosis Post Procedure Dx: Same  Successful right sided PCN exchange.    EBL: None   No immediate complications.   Jay Rilee Wendling, MD Pager #: [phone]   

## 2018-03-22 ENCOUNTER — Encounter (HOSPITAL_COMMUNITY): Payer: Self-pay | Admitting: *Deleted

## 2018-03-22 ENCOUNTER — Emergency Department (HOSPITAL_COMMUNITY): Payer: Medicaid Other

## 2018-03-22 ENCOUNTER — Other Ambulatory Visit: Payer: Self-pay

## 2018-03-22 ENCOUNTER — Inpatient Hospital Stay (HOSPITAL_COMMUNITY)
Admission: EM | Admit: 2018-03-22 | Discharge: 2018-03-26 | DRG: 699 | Disposition: A | Discharge status: Skilled Nursing Facility | Payer: Medicaid Other | Attending: Internal Medicine | Admitting: Internal Medicine

## 2018-03-22 DIAGNOSIS — T83511A Infection and inflammatory reaction due to indwelling urethral catheter, initial encounter: Principal | ICD-10-CM | POA: Diagnosis present

## 2018-03-22 DIAGNOSIS — Z936 Other artificial openings of urinary tract status: Secondary | ICD-10-CM | POA: Diagnosis not present

## 2018-03-22 DIAGNOSIS — K5900 Constipation, unspecified: Secondary | ICD-10-CM | POA: Diagnosis present

## 2018-03-22 DIAGNOSIS — Z1612 Extended spectrum beta lactamase (ESBL) resistance: Secondary | ICD-10-CM | POA: Diagnosis present

## 2018-03-22 DIAGNOSIS — N400 Enlarged prostate without lower urinary tract symptoms: Secondary | ICD-10-CM | POA: Diagnosis present

## 2018-03-22 DIAGNOSIS — E1122 Type 2 diabetes mellitus with diabetic chronic kidney disease: Secondary | ICD-10-CM | POA: Diagnosis present

## 2018-03-22 DIAGNOSIS — N135 Crossing vessel and stricture of ureter without hydronephrosis: Secondary | ICD-10-CM | POA: Diagnosis present

## 2018-03-22 DIAGNOSIS — R310 Gross hematuria: Secondary | ICD-10-CM

## 2018-03-22 DIAGNOSIS — N390 Urinary tract infection, site not specified: Secondary | ICD-10-CM | POA: Diagnosis not present

## 2018-03-22 DIAGNOSIS — Z452 Encounter for adjustment and management of vascular access device: Secondary | ICD-10-CM

## 2018-03-22 DIAGNOSIS — D649 Anemia, unspecified: Secondary | ICD-10-CM | POA: Diagnosis not present

## 2018-03-22 DIAGNOSIS — Z86718 Personal history of other venous thrombosis and embolism: Secondary | ICD-10-CM

## 2018-03-22 DIAGNOSIS — E785 Hyperlipidemia, unspecified: Secondary | ICD-10-CM | POA: Diagnosis present

## 2018-03-22 DIAGNOSIS — I129 Hypertensive chronic kidney disease with stage 1 through stage 4 chronic kidney disease, or unspecified chronic kidney disease: Secondary | ICD-10-CM | POA: Diagnosis present

## 2018-03-22 DIAGNOSIS — I2510 Atherosclerotic heart disease of native coronary artery without angina pectoris: Secondary | ICD-10-CM | POA: Diagnosis present

## 2018-03-22 DIAGNOSIS — Z7901 Long term (current) use of anticoagulants: Secondary | ICD-10-CM | POA: Diagnosis not present

## 2018-03-22 DIAGNOSIS — I10 Essential (primary) hypertension: Secondary | ICD-10-CM | POA: Diagnosis present

## 2018-03-22 DIAGNOSIS — Y846 Urinary catheterization as the cause of abnormal reaction of the patient, or of later complication, without mention of misadventure at the time of the procedure: Secondary | ICD-10-CM | POA: Diagnosis present

## 2018-03-22 DIAGNOSIS — I69351 Hemiplegia and hemiparesis following cerebral infarction affecting right dominant side: Secondary | ICD-10-CM

## 2018-03-22 DIAGNOSIS — E119 Type 2 diabetes mellitus without complications: Secondary | ICD-10-CM | POA: Diagnosis not present

## 2018-03-22 DIAGNOSIS — H409 Unspecified glaucoma: Secondary | ICD-10-CM | POA: Diagnosis present

## 2018-03-22 DIAGNOSIS — Y732 Prosthetic and other implants, materials and accessory gastroenterology and urology devices associated with adverse incidents: Secondary | ICD-10-CM | POA: Diagnosis present

## 2018-03-22 DIAGNOSIS — N184 Chronic kidney disease, stage 4 (severe): Secondary | ICD-10-CM | POA: Diagnosis present

## 2018-03-22 DIAGNOSIS — B9620 Unspecified Escherichia coli [E. coli] as the cause of diseases classified elsewhere: Secondary | ICD-10-CM | POA: Diagnosis not present

## 2018-03-22 DIAGNOSIS — D62 Acute posthemorrhagic anemia: Secondary | ICD-10-CM | POA: Diagnosis present

## 2018-03-22 DIAGNOSIS — Z887 Allergy status to serum and vaccine status: Secondary | ICD-10-CM

## 2018-03-22 DIAGNOSIS — Z79899 Other long term (current) drug therapy: Secondary | ICD-10-CM | POA: Diagnosis not present

## 2018-03-22 DIAGNOSIS — N139 Obstructive and reflux uropathy, unspecified: Secondary | ICD-10-CM | POA: Diagnosis present

## 2018-03-22 HISTORY — DX: Vitamin D deficiency, unspecified: E55.9

## 2018-03-22 LAB — CBC WITH DIFFERENTIAL/PLATELET
BASOS ABS: 0 10*3/uL (ref 0.0–0.1)
BASOS PCT: 0 %
EOS PCT: 10 %
Eosinophils Absolute: 0.6 10*3/uL (ref 0.0–0.7)
HCT: 23.2 % — ABNORMAL LOW (ref 39.0–52.0)
Hemoglobin: 6.8 g/dL — CL (ref 13.0–17.0)
Lymphocytes Relative: 27 %
Lymphs Abs: 1.7 10*3/uL (ref 0.7–4.0)
MCH: 21.9 pg — ABNORMAL LOW (ref 26.0–34.0)
MCHC: 29.3 g/dL — ABNORMAL LOW (ref 30.0–36.0)
MCV: 74.6 fL — AB (ref 78.0–100.0)
MONO ABS: 0.5 10*3/uL (ref 0.1–1.0)
Monocytes Relative: 8 %
Neutro Abs: 3.4 10*3/uL (ref 1.7–7.7)
Neutrophils Relative %: 55 %
PLATELETS: 298 10*3/uL (ref 150–400)
RBC: 3.11 MIL/uL — ABNORMAL LOW (ref 4.22–5.81)
RDW: 16.7 % — AB (ref 11.5–15.5)
WBC: 6.3 10*3/uL (ref 4.0–10.5)

## 2018-03-22 LAB — RETICULOCYTES
RBC.: 3.15 MIL/uL — AB (ref 4.22–5.81)
RETIC CT PCT: 2 % (ref 0.4–3.1)
Retic Count, Absolute: 63 10*3/uL (ref 19.0–186.0)

## 2018-03-22 LAB — COMPREHENSIVE METABOLIC PANEL
ALBUMIN: 3.5 g/dL (ref 3.5–5.0)
ALK PHOS: 63 U/L (ref 38–126)
ALT: 11 U/L — ABNORMAL LOW (ref 17–63)
AST: 16 U/L (ref 15–41)
Anion gap: 11 (ref 5–15)
BILIRUBIN TOTAL: 0.4 mg/dL (ref 0.3–1.2)
BUN: 20 mg/dL (ref 6–20)
CALCIUM: 8.9 mg/dL (ref 8.9–10.3)
CO2: 24 mmol/L (ref 22–32)
Chloride: 105 mmol/L (ref 101–111)
Creatinine, Ser: 2.34 mg/dL — ABNORMAL HIGH (ref 0.61–1.24)
GFR calc Af Amer: 34 mL/min — ABNORMAL LOW (ref >60)
GFR calc non Af Amer: 29 mL/min — ABNORMAL LOW (ref >60)
GLUCOSE: 90 mg/dL (ref 65–99)
Potassium: 4.3 mmol/L (ref 3.5–5.1)
Sodium: 140 mmol/L (ref 135–145)
TOTAL PROTEIN: 7.6 g/dL (ref 6.5–8.1)

## 2018-03-22 LAB — ABO/RH: ABO/RH(D): A POS

## 2018-03-22 LAB — PREPARE RBC (CROSSMATCH)

## 2018-03-22 MED ORDER — SODIUM CHLORIDE 0.9 % IV SOLN
10.0000 mL/h | Freq: Once | INTRAVENOUS | Status: AC
  Administered 2018-03-22: 10 mL/h via INTRAVENOUS

## 2018-03-22 MED ORDER — CALCIUM CARBONATE 1250 (500 CA) MG PO TABS
1.0000 | ORAL_TABLET | Freq: Two times a day (BID) | ORAL | Status: DC
  Administered 2018-03-22 – 2018-03-26 (×8): 500 mg via ORAL
  Filled 2018-03-22 (×8): qty 1

## 2018-03-22 MED ORDER — TAMSULOSIN HCL 0.4 MG PO CAPS
0.4000 mg | ORAL_CAPSULE | Freq: Every day | ORAL | Status: DC
  Administered 2018-03-22 – 2018-03-25 (×4): 0.4 mg via ORAL
  Filled 2018-03-22 (×4): qty 1

## 2018-03-22 MED ORDER — MYCOPHENOLATE MOFETIL 250 MG PO CAPS
500.0000 mg | ORAL_CAPSULE | Freq: Two times a day (BID) | ORAL | Status: DC
  Administered 2018-03-22 – 2018-03-26 (×8): 500 mg via ORAL
  Filled 2018-03-22 (×8): qty 2

## 2018-03-22 MED ORDER — VITAMIN D 1000 UNITS PO TABS
1000.0000 [IU] | ORAL_TABLET | Freq: Every day | ORAL | Status: DC
  Administered 2018-03-23 – 2018-03-26 (×4): 1000 [IU] via ORAL
  Filled 2018-03-22 (×4): qty 1

## 2018-03-22 MED ORDER — FUROSEMIDE 20 MG PO TABS
20.0000 mg | ORAL_TABLET | Freq: Every day | ORAL | Status: DC
  Administered 2018-03-23 – 2018-03-26 (×4): 20 mg via ORAL
  Filled 2018-03-22 (×4): qty 1

## 2018-03-22 MED ORDER — DOCUSATE SODIUM 100 MG PO CAPS
100.0000 mg | ORAL_CAPSULE | Freq: Every day | ORAL | Status: DC
  Administered 2018-03-23 – 2018-03-26 (×4): 100 mg via ORAL
  Filled 2018-03-22 (×4): qty 1

## 2018-03-22 MED ORDER — ACETAMINOPHEN 325 MG PO TABS: 650.0000 mg | ORAL_TABLET | Freq: Four times a day (QID) | ORAL | Status: DC | PRN

## 2018-03-22 MED ORDER — ACETAMINOPHEN 650 MG RE SUPP: 650.0000 mg | Freq: Four times a day (QID) | RECTAL | Status: DC | PRN

## 2018-03-22 MED ORDER — ONDANSETRON HCL 4 MG PO TABS: 4.0000 mg | ORAL_TABLET | Freq: Four times a day (QID) | ORAL | Status: DC | PRN

## 2018-03-22 MED ORDER — SENNA 8.6 MG PO TABS
1.0000 | ORAL_TABLET | Freq: Two times a day (BID) | ORAL | Status: DC
  Administered 2018-03-22 – 2018-03-26 (×8): 8.6 mg via ORAL
  Filled 2018-03-22 (×8): qty 1

## 2018-03-22 MED ORDER — HYDROCODONE-ACETAMINOPHEN 5-325 MG PO TABS: 1.0000 | ORAL_TABLET | ORAL | Status: DC | PRN

## 2018-03-22 MED ORDER — SODIUM BICARBONATE 650 MG PO TABS
650.0000 mg | ORAL_TABLET | Freq: Three times a day (TID) | ORAL | Status: DC
  Administered 2018-03-22 – 2018-03-26 (×11): 650 mg via ORAL
  Filled 2018-03-22 (×11): qty 1

## 2018-03-22 MED ORDER — SIMVASTATIN 20 MG PO TABS
40.0000 mg | ORAL_TABLET | Freq: Every day | ORAL | Status: DC
  Administered 2018-03-22 – 2018-03-25 (×4): 40 mg via ORAL
  Filled 2018-03-22 (×5): qty 2

## 2018-03-22 MED ORDER — SODIUM CHLORIDE 0.9% FLUSH: 3.0000 mL | INTRAVENOUS | Status: DC | PRN

## 2018-03-22 MED ORDER — SODIUM CHLORIDE 0.9 % IV SOLN
250.0000 mL | INTRAVENOUS | Status: DC | PRN
  Administered 2018-03-26: 250 mL via INTRAVENOUS

## 2018-03-22 MED ORDER — SODIUM CHLORIDE 0.9% FLUSH
3.0000 mL | Freq: Two times a day (BID) | INTRAVENOUS | Status: DC
  Administered 2018-03-22 – 2018-03-26 (×7): 3 mL via INTRAVENOUS

## 2018-03-22 MED ORDER — ONDANSETRON HCL 4 MG/2ML IJ SOLN: 4.0000 mg | Freq: Four times a day (QID) | INTRAMUSCULAR | Status: DC | PRN

## 2018-03-22 NOTE — ED Notes (Signed)
Unable to access by this RN, Melvern Banker, [employer]

## 2018-03-22 NOTE — H&P (Signed)
History and Physical    Brendan [name] WFU:[id number] DOB: Aug 26, 1961 DOA: 03/22/2018  PCP: Cleda Mccreedy, MD   Patient coming from: SNF   Chief Complaint: Gross hematuria, gen weakness, anemia   HPI: Brendan [name] is a 57 y.o. 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  Patient reports that he has been noted gross blood when he urinates, and in his nephrostomy bag recently and has noted the insidious development of generalized weakness in recent days.  Blood work was performed at his nursing facility and he was found to have hemoglobin just over 6.  He was directed to the ED for further evaluation of this.  He denies any abdominal pain, melena, or hematochezia.  ED Course: Upon arrival to the ED, patient is found to be afebrile, saturating well on room air, and with vitals otherwise stable.  Chemistry panel is notable for creatinine 2.34, consistent with his apparent baseline.  CT of the abdomen and pelvis is negative for any acute intra-abdominal or pelvic abnormality, including no hydronephrosis.  Type and screen was performed and 1 unit of packed red blood cells was ordered for immediate transfusion.  Patient remains hemodynamically stable and will be admitted to the medical-surgical unit for ongoing evaluation and management of gross hematuria with symptomatic anemia.  Review of Systems:  All other systems reviewed and apart from HPI, are negative.  Past Medical History:  Diagnosis Date  . Acute unilateral obstructive uropathy   . Chronic kidney disease, stage IV (severe) ([place])   . Constipation   . Coronary atherosclerosis of native coronary artery   . Diabetes mellitus without complication ([place])   . Enlarged prostate with  urinary obstruction   . Essential hypertension, malignant   . GERD (gastroesophageal reflux disease)   . Glaucoma   . Hemiparesis affecting right side as late effect of cerebrovascular accident ([place])   . Hyperlipidemia   . Hypertension   . Hypokalemia   . Muscle weakness (generalized)   . Retroperitoneal fibrosis   . Stricture or kinking of ureter   . Stroke [facility])    right sided hemiparesis  . Vitamin D deficiency     Past Surgical History:  Procedure Laterality Date  . IR GENERIC HISTORICAL  11/09/2016   IR NEPHROSTOMY EXCHANGE LEFT 11/09/2016 Corrie Mckusick, DO [facility]-INTER[facility] RAD  . IR GENERIC HISTORICAL  01/09/2017   IR NEPHROSTOMY EXCHANGE LEFT 01/09/2017 [name], MD [facility]-INTERV RAD  . IR NEPHROSTOMY EXCHANGE LEFT  03/06/2017  . IR NEPHROSTOMY EXCHANGE LEFT  04/13/2017  . IR NEPHROSTOMY EXCHANGE LEFT  06/08/2017  . IR NEPHROSTOMY EXCHANGE LEFT  07/20/2017  . IR NEPHROSTOMY EXCHANGE LEFT  08/03/2017  . IR NEPHROSTOMY EXCHANGE LEFT  09/28/2017  . IR NEPHROSTOMY EXCHANGE LEFT  11/29/2017  . IR NEPHROSTOMY EXCHANGE LEFT  01/31/2018  . NEPHROSTOMY TUBE PLACEMENT ([place] [facility])       reports that he has never smoked. He has never used smokeless tobacco. He reports that he drank alcohol. He reports that he has current or past drug history.  Allergies  Allergen Reactions  . Other     TB serum-reaction unknown. Provided via MAR records     Family History  Family history unknown: Yes     Prior to Admission medications   Medication Sig Start Date  End Date Taking? Authorizing Provider  acetaminophen (TYLENOL) 325 MG tablet Take 650 mg by mouth 3 (three) times daily as needed for mild pain or moderate pain.   Yes [provider]  calcium carbonate (CALTRATE 600) 1500 (600 Ca) MG TABS tablet Take 1 tablet by mouth 2 (two) times daily.   Yes [provider]  Cholecalciferol 1000 units tablet Take 1,000 Units by mouth daily.    Yes [provider]  docusate sodium  (COLACE) 100 MG capsule Take 100 mg by mouth daily.   Yes [provider]  furosemide (LASIX) 20 MG tablet Take 20 mg by mouth daily.   Yes [provider]  mycophenolate (CELLCEPT) 500 MG tablet Take 500 mg by mouth 2 (two) times daily.   Yes [provider]  Omega-3 Fatty Acids (FISH OIL) 1000 MG CAPS Take 1 capsule by mouth daily.   Yes [provider]  rivaroxaban (XARELTO) 20 MG TABS tablet Take 20 mg by mouth daily.   Yes [provider]  senna (SENOKOT) 8.6 MG TABS tablet Take 1 tablet by mouth 2 (two) times daily.   Yes [provider]  simvastatin (ZOCOR) 40 MG tablet Take 40 mg by mouth at bedtime.   Yes [provider]  sodium bicarbonate 650 MG tablet Take 650 mg by mouth 3 (three) times daily.   Yes [provider]  tamsulosin (FLOMAX) 0.4 MG CAPS capsule Take 0.4 mg by mouth at bedtime.   Yes [provider]    Physical Exam: Vitals:   03/22/18 1723 03/22/18 1730  BP: 130/77 121/77  Pulse: 87 90  Temp: 98.1 F (36.7 C)   TempSrc: Oral   SpO2: 100% 100%  Weight: 108.9 kg (240 lb)   Height: 6\' 1"  (1.854 m)       Constitutional: NAD, calm  Eyes: PERTLA, lids and conjunctivae normal ENMT: Mucous membranes are moist. Posterior pharynx clear of any exudate or lesions.   Neck: normal, supple, no masses, no thyromegaly Respiratory: clear to auscultation bilaterally, no wheezing, no crackles. Normal respiratory effort.   Cardiovascular: S1 & S2 heard, regular rate and rhythm. Trace pretibial edema bilaterally. Abdomen: No distension, no tenderness, soft. Bowel sounds normal.  Musculoskeletal: no clubbing / cyanosis. No joint deformity upper and lower extremities.    Skin: no significant rashes, lesions, ulcers. Warm, dry, well-perfused. Neurologic: No facial asymmetry. Right sided motor deficits.  Psychiatric: Alert and oriented x 3. Pleasant and cooperative.     Labs on Admission: I have  personally reviewed following labs and imaging studies  CBC: Recent Labs  Lab 03/22/18 1910  WBC 6.3  NEUTROABS 3.4  HGB 6.8*  HCT 23.2*  MCV 74.6*  PLT 784   Basic Metabolic Panel: Recent Labs  Lab 03/22/18 1910  NA 140  K 4.3  CL 105  CO2 24  GLUCOSE 90  BUN 20  CREATININE 2.34*  CALCIUM 8.9   GFR: Estimated Creatinine Clearance: 44.5 mL/min (A) (by C-G formula based on SCr of 2.34 mg/dL (H)). Liver Function Tests: Recent Labs  Lab 03/22/18 1910  AST 16  ALT 11*  ALKPHOS 63  BILITOT 0.4  PROT 7.6  ALBUMIN 3.5   No results for input(s): LIPASE, AMYLASE in the last 168 hours. No results for input(s): AMMONIA in the last 168 hours. Coagulation Profile: No results for input(s): INR, PROTIME in the last 168 hours. Cardiac Enzymes: No results for input(s): CKTOTAL, CKMB, CKMBINDEX, TROPONINI in the last 168 hours. BNP (  last 3 results) No results for input(s): PROBNP in the last 8760 hours. HbA1C: No results for input(s): HGBA1C in the last 72 hours. CBG: No results for input(s): GLUCAP in the last 168 hours. Lipid Profile: No results for input(s): CHOL, HDL, LDLCALC, TRIG, CHOLHDL, LDLDIRECT in the last 72 hours. Thyroid Function Tests: No results for input(s): TSH, T4TOTAL, FREET4, T3FREE, THYROIDAB in the last 72 hours. Anemia Panel: No results for input(s): VITAMINB12, FOLATE, FERRITIN, TIBC, IRON, RETICCTPCT in the last 72 hours. Urine analysis:    Component Value Date/Time   COLORURINE RED (A) 08/03/2017 0213   APPEARANCEUR TURBID (A) 08/03/2017 0213   LABSPEC  08/03/2017 0213    TEST NOT REPORTED DUE TO COLOR INTERFERENCE OF URINE PIGMENT   PHURINE  08/03/2017 0213    TEST NOT REPORTED DUE TO COLOR INTERFERENCE OF URINE PIGMENT   GLUCOSEU (A) 08/03/2017 0213    TEST NOT REPORTED DUE TO COLOR INTERFERENCE OF URINE PIGMENT   HGBUR (A) 08/03/2017 0213    TEST NOT REPORTED DUE TO COLOR INTERFERENCE OF URINE PIGMENT   BILIRUBINUR (A) 08/03/2017 0213      TEST NOT REPORTED DUE TO COLOR INTERFERENCE OF URINE PIGMENT   KETONESUR (A) 08/03/2017 0213    TEST NOT REPORTED DUE TO COLOR INTERFERENCE OF URINE PIGMENT   PROTEINUR (A) 08/03/2017 0213    TEST NOT REPORTED DUE TO COLOR INTERFERENCE OF URINE PIGMENT   NITRITE (A) 08/03/2017 0213    TEST NOT REPORTED DUE TO COLOR INTERFERENCE OF URINE PIGMENT   LEUKOCYTESUR (A) 08/03/2017 0213    TEST NOT REPORTED DUE TO COLOR INTERFERENCE OF URINE PIGMENT   Sepsis Labs: @LABRCNTIP (procalcitonin:4,lacticidven:4) )No results found for this or any previous visit (from the past 240 hour(s)).   Radiological Exams on Admission: Ct Abdomen Pelvis Wo Contrast  Result Date: 03/22/2018 CLINICAL DATA:  Per ED notes: Pt brought in by [facility] from [place] home. Labwork was done at facility recently and told that hemoglobin was 6.3. Pt denies dark black stools and bloody stools. Pt reports weakness. BP 124/70,.*comment was truncated* EXAM: CT ABDOMEN AND PELVIS WITHOUT CONTRAST TECHNIQUE: Multidetector CT imaging of the abdomen and pelvis was performed following the standard protocol without IV contrast. COMPARISON:  CT 08/03/2017 FINDINGS: Lower chest: Lung bases are clear. Hepatobiliary: No focal hepatic lesions noncontrast exam. Pancreas: Pancreas is normal. No ductal dilatation. No pancreatic inflammation. Spleen: Normal spleen Adrenals/urinary tract: Adrenal glands normal. Percutaneous nephrostomy tube extends into the LEFT renal hilum. No hydronephrosis. Mild prominence of the RIGHT renal pelvis. There is RIGHT kidney is atrophic. Ureters are difficult to follow through the retroperitoneal fibrosis. Stomach/Bowel: Stomach, small bowel, appendix, and cecum are normal. The colon and rectosigmoid colon are normal. Vascular/Lymphatic: Abdominal aorta is surrounded by a mat of confluent soft tissue density highly suggestive of retroperitoneal fibrosis. No change from comparison Reproductive: Prostate  normal Other: No free fluid. Musculoskeletal: No aggressive osseous lesion. IMPRESSION: 1. No explanation for bloody stools. No diverticulosis or diverticulitis. 2. No acute findings the abdomen pelvis. 3. Percutaneous nephrostomy tube in the LEFT kidney without evidence of hydronephrosis. 4. Again demonstrated retroperitoneal fibrosis pattern. Electronically Signed   By: Suzy Bouchard M.D.   On: 03/22/2018 19:01    EKG: Not performed.    Assessment/Plan   1. Symptomatic anemia; gross hematuria  - Presents from SNF with gen weakness and gross hematuria   - Hgb is 6.8, down from 10.7 in September 2018  - Denies melena or hematochezia;  gross hematuria noted  - CT abd/pelvis without acute findings, will check UA and culture  - Hold Xarelto, transfuse 1 unit RBC, check post-transfusion CBC   2. Retroperitoneal fibrosis; chronic ureteral obstruction  - Left nephrostomy tube draining  - No hydronephrosis on CT  - Continue Cellcept    3. CKD stage IV  - SCr is 2.34 on admission, consistent with his apparent baseline  - Renally-dose medications, avoid nephrotoxins    4. Hx of DVT  - No evidence for acute DVT/PE - Hold Xarelto in light of hematuria   5. Hx of CVA  - No acute deficits  - Continue Zocor     DVT prophylaxis: SCD's  Code Status: Full  Family Communication: Discussed with patient Consults called: None Admission status: Inpatient    Vianne Bulls, MD [facility] Pager [phone]  If 7PM-7AM, please contact night-coverage [email or web address] Password [facility]  03/22/2018, 8:45 PM

## 2018-03-22 NOTE — ED Notes (Signed)
Pt has had a foley with hematuria per his report for the last 6 month He reports when he inquires regarding his blood in his urine, he states he is told it will clear up  He is sent here for low Hgb from [facility]

## 2018-03-22 NOTE — Progress Notes (Signed)
Pt stated that his mother Paco Cislo is his legal guardian and that he has already called her to tell her that he was admitted to the hospital tonight. He asked me to wait until the morning to call her because it was too late to call.

## 2018-03-22 NOTE — ED Notes (Signed)
CRITICAL VALUE ALERT  Critical Value:  hgb 6.8  Date & Time Notied:  03/22/18 1945  Provider Notified: dr.[email or web address]  Orders Received/Actions taken: md notified, see new orders

## 2018-03-22 NOTE — ED Provider Notes (Signed)
[facility] Provider Note   CSN: [id number] Arrival date & time: 03/22/18  1716     History   Chief Complaint Chief Complaint  Patient presents with  . Abnormal Labs    HPI Brendan [name] is a 57 y.o. male.  HPI  57 year old male with a left-sided nephrostomy tube as well as having a prior stroke who is currently on Xarelto presents with anemia.  Nursing home sent in for labs drawn today that showed a hemoglobin of 6.3.  The patient states he has been having weakness and shortness of breath for months.  He is also noticed blood in his nephrostomy bag for the last several months as well.  He has seen his urologist who told him that we will stop but he does not know of a clear etiology for this.  Otherwise, the patient denies any new complaints such as chest pain.  He denies any blood in his stool or black stool.  Past Medical History:  Diagnosis Date  . Acute unilateral obstructive uropathy   . Chronic kidney disease, stage IV (severe) ([place])   . Constipation   . Coronary atherosclerosis of native coronary artery   . Diabetes mellitus without complication ([place])   . Enlarged prostate with urinary obstruction   . Essential hypertension, malignant   . GERD (gastroesophageal reflux disease)   . Glaucoma   . Hemiparesis affecting right side as late effect of cerebrovascular accident ([place])   . Hyperlipidemia   . Hypertension   . Hypokalemia   . Muscle weakness (generalized)   . Retroperitoneal fibrosis   . Stricture or kinking of ureter   . Stroke [facility])    right sided hemiparesis  . Vitamin D deficiency     Patient Active Problem List   Diagnosis Date Noted  . Symptomatic anemia 03/22/2018  . History of DVT in adulthood 03/22/2018  . Gross hematuria   . Nephrostomy tube displaced ([place]) 07/19/2017  . Pyelonephritis 02/16/2017  . 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 Coronary atherosclerosis of native coronary artery   . Essential hypertension   . Stroke  ([place])   . Diabetes mellitus without complication ([place])   . Chronic kidney disease, stage IV (severe) ([place])   . GERD (gastroesophageal reflux disease)   . Glaucoma   . Hemiparesis affecting right side as late effect of cerebrovascular accident ([place])   . Enlarged prostate with urinary obstruction   . Constipation     Past Surgical History:  Procedure Laterality Date  . IR GENERIC HISTORICAL  11/09/2016   IR NEPHROSTOMY EXCHANGE LEFT 11/09/2016 Corrie Mckusick, DO [facility]-INTERV RAD  . IR GENERIC HISTORICAL  01/09/2017   IR NEPHROSTOMY EXCHANGE LEFT 01/09/2017 [name], MD [facility]-INTERV RAD  . IR NEPHROSTOMY EXCHANGE LEFT  03/06/2017  . IR NEPHROSTOMY EXCHANGE LEFT  04/13/2017  . IR NEPHROSTOMY EXCHANGE LEFT  06/08/2017  . IR NEPHROSTOMY EXCHANGE LEFT  07/20/2017  . IR NEPHROSTOMY EXCHANGE LEFT  08/03/2017  . IR NEPHROSTOMY EXCHANGE LEFT  09/28/2017  . IR NEPHROSTOMY EXCHANGE LEFT  11/29/2017  . IR NEPHROSTOMY EXCHANGE LEFT  01/31/2018  . NEPHROSTOMY TUBE PLACEMENT ([place] [facility])          Home Medications    Prior to Admission medications   Medication Sig Start Date End Date Taking? Authorizing Provider  acetaminophen (TYLENOL) 325 MG tablet Take 650 mg by mouth 3 (three) times daily as needed for mild pain or moderate pain.   Yes [provider]  calcium carbonate (CALTRATE  600) 1500 (600 Ca) MG TABS tablet Take 1 tablet by mouth 2 (two) times daily.   Yes [provider]  Cholecalciferol 1000 units tablet Take 1,000 Units by mouth daily.    Yes [provider]  docusate sodium (COLACE) 100 MG capsule Take 100 mg by mouth daily.   Yes [provider]  furosemide (LASIX) 20 MG tablet Take 20 mg by mouth daily.   Yes [provider]  mycophenolate (CELLCEPT) 500 MG tablet Take 500 mg by mouth 2 (two) times daily.   Yes [provider]  Omega-3 Fatty Acids (FISH OIL) 1000 MG CAPS Take 1 capsule by mouth daily.   Yes [provider]    rivaroxaban (XARELTO) 20 MG TABS tablet Take 20 mg by mouth daily.   Yes [provider]  senna (SENOKOT) 8.6 MG TABS tablet Take 1 tablet by mouth 2 (two) times daily.   Yes [provider]  simvastatin (ZOCOR) 40 MG tablet Take 40 mg by mouth at bedtime.   Yes [provider]  sodium bicarbonate 650 MG tablet Take 650 mg by mouth 3 (three) times daily.   Yes [provider]  tamsulosin (FLOMAX) 0.4 MG CAPS capsule Take 0.4 mg by mouth at bedtime.   Yes [provider]    Family History Family History  Family history unknown: Yes    Social History Social History   Tobacco Use  . Smoking status: Never Smoker  . Smokeless tobacco: Never Used  Substance Use Topics  . Alcohol use: Not Currently  . Drug use: Not Currently     Allergies   Other   Review of Systems Review of Systems  Constitutional: Positive for fatigue.  Respiratory: Positive for shortness of breath.   Cardiovascular: Negative for chest pain.  Gastrointestinal: Negative for abdominal pain, blood in stool and vomiting.  Musculoskeletal: Negative for back pain.  Neurological: Positive for weakness.  All other systems reviewed and are negative.    Physical Exam Updated Vital Signs BP 122/86   Pulse 82   Temp 97.9 F (36.6 C) (Oral)   Resp 16   Ht 6\' 1"  (1.854 m)   Wt 108.9 kg (240 lb)   SpO2 100%   BMI 31.66 kg/m   Physical Exam  Constitutional: He is oriented to person, place, and time. He appears well-developed and well-nourished.  HENT:  Head: Normocephalic and atraumatic.  Right Ear: External ear normal.  Left Ear: External ear normal.  Nose: Nose normal.  Eyes: Right eye exhibits no discharge. Left eye exhibits no discharge.  Neck: Neck supple.  Cardiovascular: Normal rate, regular rhythm, normal heart sounds and intact distal pulses.  Pulmonary/Chest: Effort normal and breath sounds normal.  Abdominal: Soft. There is no tenderness.  No pain  or swelling at left nephrostomy site  Genitourinary:  Genitourinary Comments: Nephrostomy bag with red urine  Musculoskeletal: He exhibits no edema.  Neurological: He is alert and oriented to person, place, and time.  Right sided weakness from prior stroke  Skin: Skin is warm and dry. He is not diaphoretic.  Nursing note and vitals reviewed.    ED Treatments / Results  Labs (all labs ordered are listed, but only abnormal results are displayed) Labs Reviewed  COMPREHENSIVE METABOLIC PANEL - Abnormal; Notable for the following components:      Result Value   Creatinine, Ser 2.34 (*)    ALT 11 (*)    GFR calc non Af Amer 29 (*)  GFR calc Af Amer 34 (*)    All other components within normal limits  CBC WITH DIFFERENTIAL/PLATELET - Abnormal; Notable for the following components:   RBC 3.11 (*)    Hemoglobin 6.8 (*)    HCT 23.2 (*)    MCV 74.6 (*)    MCH 21.9 (*)    MCHC 29.3 (*)    RDW 16.7 (*)    All other components within normal limits  RETICULOCYTES - Abnormal; Notable for the following components:   RBC. 3.15 (*)    All other components within normal limits  URINE CULTURE  MRSA PCR SCREENING  HIV ANTIBODY (ROUTINE TESTING)  BASIC METABOLIC PANEL  VITAMIN S17  FOLATE  IRON AND TIBC  FERRITIN  URINALYSIS, ROUTINE W REFLEX MICROSCOPIC  TYPE AND SCREEN  PREPARE RBC (CROSSMATCH)  ABO/RH    EKG None  Radiology Ct Abdomen Pelvis Wo Contrast  Result Date: 03/22/2018 CLINICAL DATA:  Per ED notes: Pt brought in by [facility] from [facility] home. Labwork was done at facility recently and told that hemoglobin was 6.3. Pt denies dark black stools and bloody stools. Pt reports weakness. BP 124/70,.*comment was truncated* EXAM: CT ABDOMEN AND PELVIS WITHOUT CONTRAST TECHNIQUE: Multidetector CT imaging of the abdomen and pelvis was performed following the standard protocol without IV contrast. COMPARISON:  CT 08/03/2017 FINDINGS: Lower chest: Lung bases are  clear. Hepatobiliary: No focal hepatic lesions noncontrast exam. Pancreas: Pancreas is normal. No ductal dilatation. No pancreatic inflammation. Spleen: Normal spleen Adrenals/urinary tract: Adrenal glands normal. Percutaneous nephrostomy tube extends into the LEFT renal hilum. No hydronephrosis. Mild prominence of the RIGHT renal pelvis. There is RIGHT kidney is atrophic. Ureters are difficult to follow through the retroperitoneal fibrosis. Stomach/Bowel: Stomach, small bowel, appendix, and cecum are normal. The colon and rectosigmoid colon are normal. Vascular/Lymphatic: Abdominal aorta is surrounded by a mat of confluent soft tissue density highly suggestive of retroperitoneal fibrosis. No change from comparison Reproductive: Prostate normal Other: No free fluid. Musculoskeletal: No aggressive osseous lesion. IMPRESSION: 1. No explanation for bloody stools. No diverticulosis or diverticulitis. 2. No acute findings the abdomen pelvis. 3. Percutaneous nephrostomy tube in the LEFT kidney without evidence of hydronephrosis. 4. Again demonstrated retroperitoneal fibrosis pattern. Electronically Signed   By: Suzy Bouchard M.D.   On: 03/22/2018 19:01    Procedures .Critical Care Performed by: Sherwood Gambler, MD Authorized by: Sherwood Gambler, MD   Critical care provider statement:    Critical care time (minutes):  30   Critical care time was exclusive of:  Separately billable procedures and treating other patients   Critical care was necessary to treat or prevent imminent or life-threatening deterioration of the following conditions:  Circulatory failure and shock   Critical care was time spent personally by me on the following activities:  Development of treatment plan with patient or surrogate, discussions with consultants, evaluation of patient's response to treatment, examination of patient, obtaining history from patient or surrogate, ordering and performing treatments and interventions, ordering  and review of laboratory studies, ordering and review of radiographic studies, pulse oximetry, re-evaluation of patient's condition and review of old charts   (including critical care time)  Angiocath insertion Performed by: Ephraim Hamburger  Consent: Verbal consent obtained. Risks and benefits: risks, benefits and alternatives were discussed Time out: Immediately prior to procedure a "time out" was called to verify the correct patient, procedure, equipment, support staff and site/side marked as required.  Preparation: Patient was prepped and draped in the  usual sterile fashion.  Vein Location: left basilic  Ultrasound Guided  Gauge: 20  Normal blood return and flush without difficulty Patient tolerance: Patient tolerated the procedure well with no immediate complications.     Medications Ordered in ED Medications  calcium carbonate (OS-CAL - dosed in mg of elemental calcium) tablet 500 mg of elemental calcium (500 mg of elemental calcium Oral Given 03/22/18 2348)  cholecalciferol (VITAMIN D) tablet 1,000 Units (has no administration in time range)  docusate sodium (COLACE) capsule 100 mg (has no administration in time range)  furosemide (LASIX) tablet 20 mg (has no administration in time range)  mycophenolate (CELLCEPT) capsule 500 mg (500 mg Oral Given 03/22/18 2348)  senna (SENOKOT) tablet 8.6 mg (8.6 mg Oral Given 03/22/18 2349)  simvastatin (ZOCOR) tablet 40 mg (40 mg Oral Given 03/22/18 2347)  sodium bicarbonate tablet 650 mg (650 mg Oral Given 03/22/18 2348)  tamsulosin (FLOMAX) capsule 0.4 mg (0.4 mg Oral Given 03/22/18 2347)  acetaminophen (TYLENOL) tablet 650 mg (has no administration in time range)    Or  acetaminophen (TYLENOL) suppository 650 mg (has no administration in time range)  HYDROcodone-acetaminophen (NORCO/VICODIN) 5-325 MG per tablet 1-2 tablet (has no administration in time range)  sodium chloride flush (NS) 0.9 % injection 3 mL (3 mLs Intravenous Given  03/22/18 2349)  sodium chloride flush (NS) 0.9 % injection 3 mL (has no administration in time range)  0.9 %  sodium chloride infusion (has no administration in time range)  ondansetron (ZOFRAN) tablet 4 mg (has no administration in time range)    Or  ondansetron (ZOFRAN) injection 4 mg (has no administration in time range)  0.9 %  sodium chloride infusion (10 mL/hr Intravenous New Bag/Given 03/22/18 2145)     Initial Impression / Assessment and Plan / ED Course  I have reviewed the triage vital signs and the nursing notes.  Pertinent labs & imaging results that were available during my care of the patient were reviewed by me and considered in my medical decision making (see chart for details).     Patient is hemodynamically stable but his hemoglobin is 6.8.  He is symptomatic and given active bleeding with the hematuria he will be given a unit of blood.  This bleeding has been ongoing for months.  There is no obvious cause on a noncontrasted CT scan given his renal failure.  However given the active bleeding he will need monitoring and repeat hemoglobins.  Dr. Myna Hidalgo to admit.   Final Clinical Impressions(s) / ED Diagnoses   Final diagnoses:  Symptomatic anemia  Gross hematuria    ED Discharge Orders    None       Sherwood Gambler, MD 03/22/18 2356

## 2018-03-22 NOTE — ED Triage Notes (Signed)
Pt brought in by [facility] from [facility] home. Labwork was done at facility recently and told that hemoglobin was 6.3. Pt denies dark black stools and bloody stools. Pt reports weakness. BP 124/70, HR 91, resp 16 CBG 129 for EMS.

## 2018-03-23 ENCOUNTER — Encounter (HOSPITAL_COMMUNITY): Payer: Self-pay | Admitting: Family Medicine

## 2018-03-23 DIAGNOSIS — E119 Type 2 diabetes mellitus without complications: Secondary | ICD-10-CM

## 2018-03-23 DIAGNOSIS — I2510 Atherosclerotic heart disease of native coronary artery without angina pectoris: Secondary | ICD-10-CM

## 2018-03-23 LAB — CBC
HEMATOCRIT: 25.5 % — AB (ref 39.0–52.0)
HEMOGLOBIN: 7.6 g/dL — AB (ref 13.0–17.0)
MCH: 22.7 pg — AB (ref 26.0–34.0)
MCHC: 29.8 g/dL — ABNORMAL LOW (ref 30.0–36.0)
MCV: 76.1 fL — ABNORMAL LOW (ref 78.0–100.0)
Platelets: 298 10*3/uL (ref 150–400)
RBC: 3.35 MIL/uL — AB (ref 4.22–5.81)
RDW: 16.4 % — ABNORMAL HIGH (ref 11.5–15.5)
WBC: 5.4 10*3/uL (ref 4.0–10.5)

## 2018-03-23 LAB — URINALYSIS, ROUTINE W REFLEX MICROSCOPIC
Bacteria, UA: NONE SEEN
Bilirubin Urine: NEGATIVE
GLUCOSE, UA: NEGATIVE mg/dL
Ketones, ur: NEGATIVE mg/dL
Nitrite: NEGATIVE
PH: 7 (ref 5.0–8.0)
Protein, ur: 100 mg/dL — AB
SPECIFIC GRAVITY, URINE: 1.012 (ref 1.005–1.030)

## 2018-03-23 LAB — BASIC METABOLIC PANEL
Anion gap: 10 (ref 5–15)
BUN: 19 mg/dL (ref 6–20)
CO2: 25 mmol/L (ref 22–32)
Calcium: 8.8 mg/dL — ABNORMAL LOW (ref 8.9–10.3)
Chloride: 107 mmol/L (ref 101–111)
Creatinine, Ser: 2.3 mg/dL — ABNORMAL HIGH (ref 0.61–1.24)
GFR calc Af Amer: 34 mL/min — ABNORMAL LOW (ref >60)
GFR, EST NON AFRICAN AMERICAN: 30 mL/min — AB (ref >60)
GLUCOSE: 86 mg/dL (ref 65–99)
POTASSIUM: 3.9 mmol/L (ref 3.5–5.1)
Sodium: 142 mmol/L (ref 135–145)

## 2018-03-23 LAB — HEMOGLOBIN AND HEMATOCRIT, BLOOD
HCT: 30.6 % — ABNORMAL LOW (ref 39.0–52.0)
HEMOGLOBIN: 9.5 g/dL — AB (ref 13.0–17.0)

## 2018-03-23 LAB — IRON AND TIBC
Iron: 28 ug/dL — ABNORMAL LOW (ref 45–182)
SATURATION RATIOS: 9 % — AB (ref 17.9–39.5)
TIBC: 305 ug/dL (ref 250–450)
UIBC: 277 ug/dL

## 2018-03-23 LAB — MRSA PCR SCREENING: MRSA by PCR: NEGATIVE

## 2018-03-23 LAB — PREPARE RBC (CROSSMATCH)

## 2018-03-23 LAB — FOLATE: Folate: 6.6 ng/mL (ref >5.9)

## 2018-03-23 LAB — VITAMIN B12: Vitamin B-12: 253 pg/mL (ref 180–914)

## 2018-03-23 LAB — FERRITIN: FERRITIN: 14 ng/mL — AB (ref 24–336)

## 2018-03-23 MED ORDER — SODIUM CHLORIDE 0.9 % IV SOLN
Freq: Once | INTRAVENOUS | Status: AC
  Administered 2018-03-23: 12:00:00 via INTRAVENOUS

## 2018-03-23 NOTE — Plan of Care (Signed)
progressing 

## 2018-03-23 NOTE — Progress Notes (Signed)
PROGRESS NOTE    Chrles Selley  TKW:[id number]  DOB: February 01, 1961  DOA: 03/22/2018 PCP: Cleda Mccreedy, MD   Brief Admission Hx: Kolbey Teichert is a 57 y.o. 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   MDM/Assessment & Plan:   1. Acute blood loss anemia - Pt having gross hematuria from his nephrostomy.  He reports that this has been ongoing for the last 6 months and he has reported it to his urologist multiple times. I don't have access to the outpatient urology records at this time.  He is feeling better after 1 unit of PRBC but continues to have bleeding.  Will transfuse an additional 2 units PRBC today and follow hemoglobin. Hopefully we can have urology to see him on Monday.  2. 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  It has been changed out several times by IR.  No hydronephrosis seen on CT.  Continue cellcept.   3. Stage 4 CKD - stable, following.   4. History of DVT - Holding xarelto for now due to gross hematuria.  Will defer to urology if it can be restarted.  5. History of CVA - stable, continue home meds.    DVT prophylaxis: SCDs Code Status: Full  Family Communication: patient Disposition Plan: return to SNF when medically stabilized   Subjective: Pt says that he is feeling better after the blood transfusion.  The gross hematuria continues.   Objective: Vitals:   03/22/18 2133 03/22/18 2154 03/23/18 0126 03/23/18 0501  BP: (!) 125/98 122/86 139/77 125/88  Pulse: 89 82 78 81  Resp:  16 16 16   Temp: 98.2 F (36.8 C) 97.9 F (36.6 C) 97.8 F (36.6 C) 97.9 F (36.6 C)  TempSrc: Oral Oral Oral Oral  SpO2:  100%  100%    Weight:    108.7 kg (239 lb 10.2 oz)  Height:        Intake/Output Summary (Last 24 hours) at 03/23/2018 0932 Last data filed at 03/23/2018 0920 Gross per 24 hour  Intake 990 ml  Output 1000 ml  Net -10 ml   Filed Weights   03/22/18 1723 03/23/18 0501  Weight: 108.9 kg (240 lb) 108.7 kg (239 lb 10.2 oz)   REVIEW OF SYSTEMS  As per history otherwise all reviewed and reported negative  Exam:  General exam: awake, alert, NAD. Eating breakfast.  Respiratory system: Clear. No increased work of breathing. Cardiovascular system: S1 & S2 heard, RRR. No JVD, murmurs, gallops, clicks or pedal edema. Gastrointestinal system: Abdomen is nondistended, soft and nontender. Normal bowel sounds heard. GU: left nephrostomy tube freely flowing gross red blood seen.  Central nervous system: Alert and oriented. Extremities: no CCE. SCDs.   Data Reviewed: Basic Metabolic Panel: Recent Labs  Lab 03/22/18 1910 03/23/18 0403  NA 140 142  K 4.3 3.9  CL 105 107  CO2 24 25  GLUCOSE 90 86  BUN 20 19  CREATININE 2.34* 2.30*  CALCIUM 8.9 8.8*   Liver Function Tests: Recent Labs  Lab 03/22/18 1910  AST 16  ALT 11*  ALKPHOS 63  BILITOT 0.4  PROT 7.6  ALBUMIN 3.5   No results for input(s): LIPASE, AMYLASE in the last 168 hours.  No results for input(s): AMMONIA in the last 168 hours. CBC: Recent Labs  Lab 03/22/18 1910 03/23/18 0403  WBC 6.3 5.4  NEUTROABS 3.4  --   HGB 6.8* 7.6*  HCT 23.2* 25.5*  MCV 74.6* 76.1*  PLT 298 298   Cardiac Enzymes: No results for input(s): CKTOTAL, CKMB, CKMBINDEX, TROPONINI in the last 168 hours. CBG (last 3)  No results for input(s): GLUCAP in the last 72 hours. Recent Results (from the past 240 hour(s))  MRSA PCR Screening     Status: None   Collection Time: 03/22/18 11:35 PM  Result Value Ref Range Status   MRSA by PCR NEGATIVE NEGATIVE Final    Comment:        The GeneXpert MRSA Assay (FDA approved for NASAL specimens only), is one  component of a comprehensive MRSA colonization surveillance program. It is not intended to diagnose MRSA infection nor to guide or monitor treatment for MRSA infections. Performed at [facility], [address], [place], [place] [address]      Studies: Ct Abdomen Pelvis Wo Contrast  Result Date: 03/22/2018 CLINICAL DATA:  Per ED notes: Pt brought in by [facility] from [facility] home. Labwork was done at facility recently and told that hemoglobin was 6.3. Pt denies dark black stools and bloody stools. Pt reports weakness. BP 124/70,.*comment was truncated* EXAM: CT ABDOMEN AND PELVIS WITHOUT CONTRAST TECHNIQUE: Multidetector CT imaging of the abdomen and pelvis was performed following the standard protocol without IV contrast. COMPARISON:  CT 08/03/2017 FINDINGS: Lower chest: Lung bases are clear. Hepatobiliary: No focal hepatic lesions noncontrast exam. Pancreas: Pancreas is normal. No ductal dilatation. No pancreatic inflammation. Spleen: Normal spleen Adrenals/urinary tract: Adrenal glands normal. Percutaneous nephrostomy tube extends into the LEFT renal hilum. No hydronephrosis. Mild prominence of the RIGHT renal pelvis. There is RIGHT kidney is atrophic. Ureters are difficult to follow through the retroperitoneal fibrosis. Stomach/Bowel: Stomach, small bowel, appendix, and cecum are normal. The colon and rectosigmoid colon are normal. Vascular/Lymphatic: Abdominal aorta is surrounded by a mat of confluent soft tissue density highly suggestive of retroperitoneal fibrosis. No change from comparison Reproductive: Prostate normal Other: No free fluid. Musculoskeletal: No aggressive osseous lesion. IMPRESSION: 1. No explanation for bloody stools. No diverticulosis or diverticulitis. 2. No acute findings the abdomen pelvis. 3. Percutaneous nephrostomy tube in the LEFT kidney without evidence of hydronephrosis. 4. Again demonstrated retroperitoneal fibrosis pattern. Electronically Signed    By: Suzy Bouchard M.D.   On: 03/22/2018 19:01   Scheduled Meds: . calcium carbonate  1 tablet Oral BID  . cholecalciferol  1,000 Units Oral Daily  . docusate sodium  100 mg Oral Daily  . furosemide  20 mg Oral Daily  . mycophenolate  500 mg Oral BID  . senna  1 tablet Oral BID  . simvastatin  40 mg Oral q1800  . sodium bicarbonate  650 mg Oral TID  . sodium chloride flush  3 mL Intravenous Q12H  . tamsulosin  0.4 mg Oral QHS   Continuous Infusions: . sodium chloride    . sodium chloride      Principal Problem:   Symptomatic anemia Active Problems:   Coronary atherosclerosis of native coronary artery   Essential hypertension   Diabetes mellitus without complication ([facility])   Chronic kidney disease, stage IV (severe) ([facility])   Hemiparesis affecting right side as late effect of cerebrovascular accident [facility])   Retroperitoneal fibrosis   History of DVT in adulthood   Time spent:  Irwin Brakeman, MD, FAAFP [facility] Pager [phone]  If 7PM-7AM, please contact night-coverage [email or web address] Password [email or web address] 03/23/2018, 9:32 AM    LOS: 1 day

## 2018-03-24 DIAGNOSIS — R310 Gross hematuria: Secondary | ICD-10-CM

## 2018-03-24 DIAGNOSIS — N184 Chronic kidney disease, stage 4 (severe): Secondary | ICD-10-CM

## 2018-03-24 DIAGNOSIS — I10 Essential (primary) hypertension: Secondary | ICD-10-CM

## 2018-03-24 DIAGNOSIS — D649 Anemia, unspecified: Secondary | ICD-10-CM

## 2018-03-24 DIAGNOSIS — I69351 Hemiplegia and hemiparesis following cerebral infarction affecting right dominant side: Secondary | ICD-10-CM

## 2018-03-24 LAB — CBC
HEMATOCRIT: 31.1 % — AB (ref 39.0–52.0)
Hemoglobin: 9.6 g/dL — ABNORMAL LOW (ref 13.0–17.0)
MCH: 23.7 pg — AB (ref 26.0–34.0)
MCHC: 30.9 g/dL (ref 30.0–36.0)
MCV: 76.8 fL — AB (ref 78.0–100.0)
PLATELETS: 262 10*3/uL (ref 150–400)
RBC: 4.05 MIL/uL — ABNORMAL LOW (ref 4.22–5.81)
RDW: 16.3 % — AB (ref 11.5–15.5)
WBC: 5.9 10*3/uL (ref 4.0–10.5)

## 2018-03-24 LAB — BPAM RBC
Blood Product Expiration Date: 201904252359
Blood Product Expiration Date: 201904292359
Blood Product Expiration Date: 201905092359
ISSUE DATE / TIME: 201904192109
ISSUE DATE / TIME: 201904201524
ISSUE DATE / TIME: 201904201156
UNIT TYPE AND RH: 6200
Unit Type and Rh: 6200
Unit Type and Rh: 6200

## 2018-03-24 LAB — TYPE AND SCREEN
ABO/RH(D): A POS
Antibody Screen: NEGATIVE
Unit division: 0
Unit division: 0
Unit division: 0

## 2018-03-24 LAB — HIV ANTIBODY (ROUTINE TESTING W REFLEX): HIV Screen 4th Generation wRfx: NONREACTIVE

## 2018-03-24 NOTE — Progress Notes (Signed)
PROGRESS NOTE    Kanon Novosel  IOX:[id number] DOB: Aug 21, 1961 DOA: 03/22/2018 PCP: Cleda Mccreedy, MD     Brief Narrative:  57 year old man admitted from skilled nursing facility on 4/19 due to hematuria.  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  Admission was requested.   Assessment & Plan:   Principal Problem:   Symptomatic anemia Active Problems:   Coronary atherosclerosis of native coronary artery   Essential hypertension   Diabetes mellitus without complication (HCC)   Chronic kidney disease, stage IV (severe) (HCC)   Hemiparesis affecting right side as late effect of cerebrovascular accident ([place])   Retroperitoneal fibrosis   History of DVT in adulthood   Acute blood loss anemia -Due to gross hematuria in his nephrostomy bag. -On admission he reported that this has been ongoing for the last 6 months. -He has received a total of 3 units of PRBCs, his hemoglobin has been stable overnight in the 9 range. -We will request urology evaluation when available in the morning. -Has a history of ureteral obstruction due to idiopathic retroperitoneal fibrosis.  History of DVT -Xarelto is currently on hold due to gross hematuria that is multi-unit transfusion dependent. -We will need to defer to urology whether this can be restarted or not. -Also need to dig into history of DVT, he tells me that he thinks his blood clot was about 4 years ago, if this is the case and he has not had a recurrent VTE, then I believe Xarelto can probably be safely discontinued.  History of CVA -At baseline.  Stage IV chronic kidney disease -Stable, at baseline   DVT prophylaxis: SCDs Code Status: Full code Family Communication: Patient only Disposition Plan: Anticipate discharge back to SNF pending neurology  recommendations  Consultants:   Urology pending  Procedures:   None  Antimicrobials:  Anti-infectives (From admission, onward)   None       Subjective: Feels well, no complaints, still notes urine in his nephrostomy bag  Objective: Vitals:   03/23/18 1546 03/23/18 1745 03/23/18 2118 03/24/18 0543  BP: 121/86 115/81 106/78 116/82  Pulse: 75 83 83 80  Resp: 20 18 20 20   Temp: 98.1 F (36.7 C) 98.2 F (36.8 C) 98.1 F (36.7 C) 98 F (36.7 C)  TempSrc: Oral Oral Oral Oral  SpO2: 100% 100% 100% 100%  Weight:    111.3 kg (245 lb 6 oz)  Height:        Intake/Output Summary (Last 24 hours) at 03/24/2018 1215 Last data filed at 03/24/2018 0703 Gross per 24 hour  Intake 1525.57 ml  Output 1850 ml  Net -324.43 ml   Filed Weights   03/22/18 1723 03/23/18 0501 03/24/18 0543  Weight: 108.9 kg (240 lb) 108.7 kg (239 lb 10.2 oz) 111.3 kg (245 lb 6 oz)    Examination:  General exam: Alert, awake, oriented x 3 Respiratory system: Clear to auscultation. Respiratory effort normal. Cardiovascular system:RRR. No murmurs, rubs, gallops. Gastrointestinal system: Abdomen is nondistended, soft and nontender. No organomegaly or masses felt. Normal bowel sounds heard. Central nervous system: Alert and oriented. No focal neurological deficits. Extremities: No C/C/E, +pedal pulses Skin: No rashes, lesions or ulcers Psychiatry: Judgement and insight appear normal. Mood & affect appropriate.     Data Reviewed: I have personally reviewed following labs and imaging studies  CBC: Recent Labs  Lab 03/22/18 1910 03/23/18 0403 03/23/18 2109 03/24/18 0504  WBC 6.3 5.4  --  5.9  NEUTROABS 3.4  --   --   --   HGB 6.8* 7.6* 9.5* 9.6*  HCT 23.2* 25.5* 30.6* 31.1*  MCV 74.6* 76.1*  --  76.8*  PLT 298 298  --  500   Basic Metabolic Panel: Recent Labs  Lab 03/22/18 1910 03/23/18 0403  NA 140 142  K 4.3 3.9  CL 105 107  CO2 24 25  GLUCOSE 90 86  BUN 20 19  CREATININE 2.34*  2.30*  CALCIUM 8.9 8.8*   GFR: Estimated Creatinine Clearance: 45.8 mL/min (A) (by C-G formula based on SCr of 2.3 mg/dL (H)). Liver Function Tests: Recent Labs  Lab 03/22/18 1910  AST 16  ALT 11*  ALKPHOS 63  BILITOT 0.4  PROT 7.6  ALBUMIN 3.5   No results for input(s): LIPASE, AMYLASE in the last 168 hours. No results for input(s): AMMONIA in the last 168 hours. Coagulation Profile: No results for input(s): INR, PROTIME in the last 168 hours. Cardiac Enzymes: No results for input(s): CKTOTAL, CKMB, CKMBINDEX, TROPONINI in the last 168 hours. BNP (last 3 results) No results for input(s): PROBNP in the last 8760 hours. HbA1C: No results for input(s): HGBA1C in the last 72 hours. CBG: No results for input(s): GLUCAP in the last 168 hours. Lipid Profile: No results for input(s): CHOL, HDL, LDLCALC, TRIG, CHOLHDL, LDLDIRECT in the last 72 hours. Thyroid Function Tests: No results for input(s): TSH, T4TOTAL, FREET4, T3FREE, THYROIDAB in the last 72 hours. Anemia Panel: Recent Labs    03/22/18 1910 03/23/18 0402 03/23/18 0403  VITAMINB12  --  253  --   FOLATE  --   --  6.6  FERRITIN  --  14*  --   TIBC  --  305  --   IRON  --  28*  --   RETICCTPCT 2.0  --   --    Urine analysis:    Component Value Date/Time   COLORURINE AMBER (A) 03/23/2018 1400   APPEARANCEUR TURBID (A) 03/23/2018 1400   LABSPEC 1.012 03/23/2018 1400   PHURINE 7.0 03/23/2018 1400   GLUCOSEU NEGATIVE 03/23/2018 1400   HGBUR LARGE (A) 03/23/2018 1400   BILIRUBINUR NEGATIVE 03/23/2018 1400   KETONESUR NEGATIVE 03/23/2018 1400   PROTEINUR 100 (A) 03/23/2018 1400   NITRITE NEGATIVE 03/23/2018 1400   LEUKOCYTESUR MODERATE (A) 03/23/2018 1400   Sepsis Labs: @LABRCNTIP (procalcitonin:4,lacticidven:4)  ) Recent Results (from the past 240 hour(s))  MRSA PCR Screening     Status: None   Collection Time: 03/22/18 11:35 PM  Result Value Ref Range Status   MRSA by PCR NEGATIVE NEGATIVE Final     Comment:        The GeneXpert MRSA Assay (FDA approved for NASAL specimens only), is one component of a comprehensive MRSA colonization surveillance program. It is not intended to diagnose MRSA infection nor to guide or monitor treatment for MRSA infections. Performed at [facility], [address], [place], [place] [address]          Radiology Studies: Ct Abdomen Pelvis Wo Contrast  Result Date: 03/22/2018 CLINICAL DATA:  Per ED notes: Pt brought in by [facility] from [place] [facility]. Labwork was done at facility recently and told that hemoglobin was 6.3. Pt denies dark black stools and bloody stools. Pt reports weakness. BP 124/70,.*comment was truncated* EXAM: CT ABDOMEN AND PELVIS WITHOUT CONTRAST TECHNIQUE: Multidetector CT imaging of the  abdomen and pelvis was performed following the standard protocol without IV contrast. COMPARISON:  CT 08/03/2017 FINDINGS: Lower chest: Lung bases are clear. Hepatobiliary: No focal hepatic lesions noncontrast exam. Pancreas: Pancreas is normal. No ductal dilatation. No pancreatic inflammation. Spleen: Normal spleen Adrenals/urinary tract: Adrenal glands normal. Percutaneous nephrostomy tube extends into the LEFT renal hilum. No hydronephrosis. Mild prominence of the RIGHT renal pelvis. There is RIGHT kidney is atrophic. Ureters are difficult to follow through the retroperitoneal fibrosis. Stomach/Bowel: Stomach, small bowel, appendix, and cecum are normal. The colon and rectosigmoid colon are normal. Vascular/Lymphatic: Abdominal aorta is surrounded by a mat of confluent soft tissue density highly suggestive of retroperitoneal fibrosis. No change from comparison Reproductive: Prostate normal Other: No free fluid. Musculoskeletal: No aggressive osseous lesion. IMPRESSION: 1. No explanation for bloody stools. No diverticulosis or diverticulitis. 2. No acute findings the abdomen pelvis. 3. Percutaneous nephrostomy tube in the LEFT kidney  without evidence of hydronephrosis. 4. Again demonstrated retroperitoneal fibrosis pattern. Electronically Signed   By: Suzy Bouchard M.D.   On: 03/22/2018 19:01        Scheduled Meds: . calcium carbonate  1 tablet Oral BID  . cholecalciferol  1,000 Units Oral Daily  . docusate sodium  100 mg Oral Daily  . furosemide  20 mg Oral Daily  . mycophenolate  500 mg Oral BID  . senna  1 tablet Oral BID  . simvastatin  40 mg Oral q1800  . sodium bicarbonate  650 mg Oral TID  . sodium chloride flush  3 mL Intravenous Q12H  . tamsulosin  0.4 mg Oral QHS   Continuous Infusions: . sodium chloride       LOS: 2 days    Time spent: 25 minutes. Greater than 50% of this time was spent in direct contact with the patient, coordinating care and discussing relevant ongoing clinical issues.     Lelon Frohlich, MD [facility] Pager [phone]  If 7PM-7AM, please contact night-coverage [email or web address] Password [facility] 03/24/2018, 12:15 PM

## 2018-03-24 NOTE — Plan of Care (Signed)
Progressing. Hemoglobin is up to 9.6

## 2018-03-25 DIAGNOSIS — R310 Gross hematuria: Secondary | ICD-10-CM

## 2018-03-25 DIAGNOSIS — B9620 Unspecified Escherichia coli [E. coli] as the cause of diseases classified elsewhere: Secondary | ICD-10-CM

## 2018-03-25 DIAGNOSIS — N390 Urinary tract infection, site not specified: Secondary | ICD-10-CM

## 2018-03-25 LAB — CBC
HCT: 31.5 % — ABNORMAL LOW (ref 39.0–52.0)
Hemoglobin: 9.6 g/dL — ABNORMAL LOW (ref 13.0–17.0)
MCH: 23.6 pg — AB (ref 26.0–34.0)
MCHC: 30.5 g/dL (ref 30.0–36.0)
MCV: 77.4 fL — AB (ref 78.0–100.0)
PLATELETS: 254 10*3/uL (ref 150–400)
RBC: 4.07 MIL/uL — ABNORMAL LOW (ref 4.22–5.81)
RDW: 16.6 % — AB (ref 11.5–15.5)
WBC: 6.7 10*3/uL (ref 4.0–10.5)

## 2018-03-25 LAB — BASIC METABOLIC PANEL
Anion gap: 11 (ref 5–15)
BUN: 22 mg/dL — AB (ref 6–20)
CO2: 23 mmol/L (ref 22–32)
CREATININE: 2.39 mg/dL — AB (ref 0.61–1.24)
Calcium: 8.9 mg/dL (ref 8.9–10.3)
Chloride: 106 mmol/L (ref 101–111)
GFR calc Af Amer: 33 mL/min — ABNORMAL LOW (ref >60)
GFR calc non Af Amer: 28 mL/min — ABNORMAL LOW (ref >60)
Glucose, Bld: 89 mg/dL (ref 65–99)
Potassium: 4 mmol/L (ref 3.5–5.1)
SODIUM: 140 mmol/L (ref 135–145)

## 2018-03-25 NOTE — Progress Notes (Signed)
PROGRESS NOTE    Brendan [name]  WUJ:[id number] DOB: 1961/08/20 DOA: 03/22/2018 PCP: Cleda Mccreedy, MD     Brief Narrative:  57 year old man admitted from skilled nursing facility on 4/19 due to hematuria.  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  Admission was requested.   Assessment & Plan:   Principal Problem:   Symptomatic anemia Active Problems:   Coronary atherosclerosis of native coronary artery   Essential hypertension   Diabetes mellitus without complication (HCC)   Chronic kidney disease, stage IV (severe) (HCC)   Hemiparesis affecting right side as late effect of cerebrovascular accident [facility])   Retroperitoneal fibrosis   History of DVT in adulthood   Acute blood loss anemia -Due to gross hematuria in his nephrostomy bag. -On admission he reported that this has been ongoing for the last 6 months. -He has received a total of 3 units of PRBCs, his hemoglobin has been stable overnight in the mid 9 range. -Pending urology evaluation to see if further work up is necessary and whether we may resume anticoagulation or not. -Has a history of ureteral obstruction due to idiopathic retroperitoneal fibrosis.  History of DVT -Xarelto is currently on hold due to gross hematuria that is multi-unit transfusion dependent. -We will need to defer to urology whether this can be restarted or not. -Also need to dig into history of DVT, he tells me that he thinks his blood clot was about 4 years ago, if this is the case and he has not had a recurrent VTE, then I believe Xarelto can probably be safely discontinued.  History of CVA -At baseline.  Stage IV chronic kidney disease -Stable, at baseline   DVT prophylaxis: SCDs Code Status: Full code Family Communication: Patient only Disposition Plan:  Anticipate discharge back to SNF pending urology recommendations  Consultants:   Urology pending  Procedures:   None  Antimicrobials:  Anti-infectives (From admission, onward)   None       Subjective: No acute issues. No complaints.  Objective: Vitals:   03/24/18 1320 03/24/18 2152 03/25/18 0434 03/25/18 1306  BP: 124/82 131/84 127/89 107/79  Pulse: 81 83 85 86  Resp: 18 20 20 18   Temp: 98.4 F (36.9 C) 98.2 F (36.8 C) 98 F (36.7 C) 98.3 F (36.8 C)  TempSrc: Oral Oral Oral Oral  SpO2: 100% 96% 99% 100%  Weight:   109.8 kg (242 lb 1 oz)   Height:        Intake/Output Summary (Last 24 hours) at 03/25/2018 1502 Last data filed at 03/25/2018 1408 Gross per 24 hour  Intake 480 ml  Output 2350 ml  Net -1870 ml   Filed Weights   03/23/18 0501 03/24/18 0543 03/25/18 0434  Weight: 108.7 kg (239 lb 10.2 oz) 111.3 kg (245 lb 6 oz) 109.8 kg (242 lb 1 oz)    Examination:  General exam: Alert, awake, oriented x 3 Respiratory system: Clear to auscultation. Respiratory effort normal. Cardiovascular system:RRR. No murmurs, rubs, gallops. Gastrointestinal system: Abdomen is nondistended, soft and nontender. No organomegaly or masses felt. Normal bowel sounds heard. Central nervous system: Alert and oriented. No focal neurological deficits. Extremities: No C/C/E, +pedal pulses Skin: No rashes, lesions or ulcers Psychiatry: Judgement and insight appear normal. Mood & affect appropriate.      Data Reviewed: I have personally reviewed following labs and  imaging studies  CBC: Recent Labs  Lab 03/22/18 1910 03/23/18 0403 03/23/18 2109 03/24/18 0504 03/25/18 0451  WBC 6.3 5.4  --  5.9 6.7  NEUTROABS 3.4  --   --   --   --   HGB 6.8* 7.6* 9.5* 9.6* 9.6*  HCT 23.2* 25.5* 30.6* 31.1* 31.5*  MCV 74.6* 76.1*  --  76.8* 77.4*  PLT 298 298  --  262 734   Basic Metabolic Panel: Recent Labs  Lab 03/22/18 1910 03/23/18 0403 03/25/18 0451  NA 140 142 140  K 4.3 3.9  4.0  CL 105 107 106  CO2 24 25 23   GLUCOSE 90 86 89  BUN 20 19 22*  CREATININE 2.34* 2.30* 2.39*  CALCIUM 8.9 8.8* 8.9   GFR: Estimated Creatinine Clearance: 43.8 mL/min (A) (by C-G formula based on SCr of 2.39 mg/dL (H)). Liver Function Tests: Recent Labs  Lab 03/22/18 1910  AST 16  ALT 11*  ALKPHOS 63  BILITOT 0.4  PROT 7.6  ALBUMIN 3.5   No results for input(s): LIPASE, AMYLASE in the last 168 hours. No results for input(s): AMMONIA in the last 168 hours. Coagulation Profile: No results for input(s): INR, PROTIME in the last 168 hours. Cardiac Enzymes: No results for input(s): CKTOTAL, CKMB, CKMBINDEX, TROPONINI in the last 168 hours. BNP (last 3 results) No results for input(s): PROBNP in the last 8760 hours. HbA1C: No results for input(s): HGBA1C in the last 72 hours. CBG: No results for input(s): GLUCAP in the last 168 hours. Lipid Profile: No results for input(s): CHOL, HDL, LDLCALC, TRIG, CHOLHDL, LDLDIRECT in the last 72 hours. Thyroid Function Tests: No results for input(s): TSH, T4TOTAL, FREET4, T3FREE, THYROIDAB in the last 72 hours. Anemia Panel: Recent Labs    03/22/18 1910 03/23/18 0402 03/23/18 0403  VITAMINB12  --  253  --   FOLATE  --   --  6.6  FERRITIN  --  14*  --   TIBC  --  305  --   IRON  --  28*  --   RETICCTPCT 2.0  --   --    Urine analysis:    Component Value Date/Time   COLORURINE AMBER (A) 03/23/2018 1400   APPEARANCEUR TURBID (A) 03/23/2018 1400   LABSPEC 1.012 03/23/2018 1400   PHURINE 7.0 03/23/2018 1400   GLUCOSEU NEGATIVE 03/23/2018 1400   HGBUR LARGE (A) 03/23/2018 1400   BILIRUBINUR NEGATIVE 03/23/2018 1400   KETONESUR NEGATIVE 03/23/2018 1400   PROTEINUR 100 (A) 03/23/2018 1400   NITRITE NEGATIVE 03/23/2018 1400   LEUKOCYTESUR MODERATE (A) 03/23/2018 1400   Sepsis Labs: @LABRCNTIP (procalcitonin:4,lacticidven:4)  ) Recent Results (from the past 240 hour(s))  MRSA PCR Screening     Status: None   Collection  Time: 03/22/18 11:35 PM  Result Value Ref Range Status   MRSA by PCR NEGATIVE NEGATIVE Final    Comment:        The GeneXpert MRSA Assay (FDA approved for NASAL specimens only), is one component of a comprehensive MRSA colonization surveillance program. It is not intended to diagnose MRSA infection nor to guide or monitor treatment for MRSA infections. Performed at [facility], [address] [place], [place] [address]   Culture, Urine     Status: Abnormal (Preliminary result)   Collection Time: 03/23/18  2:00 PM  Result Value Ref Range Status   Specimen Description   Final    URINE, CLEAN CATCH Performed at [facility], [address] [place], [place] [address]  Special Requests   Final    NONE Performed at [facility], [address] [place], [place] [address]    Culture (A)  Final    >=100,000 COLONIES/mL ESCHERICHIA COLI REPEATING SENSITIVITIES Performed at [facility], [place] [address] [place], [place] [address]    Report Status PENDING  Incomplete         Radiology Studies: No results found.      Scheduled Meds: . calcium carbonate  1 tablet Oral BID  . cholecalciferol  1,000 Units Oral Daily  . docusate sodium  100 mg Oral Daily  . furosemide  20 mg Oral Daily  . mycophenolate  500 mg Oral BID  . senna  1 tablet Oral BID  . simvastatin  40 mg Oral q1800  . sodium bicarbonate  650 mg Oral TID  . sodium chloride flush  3 mL Intravenous Q12H  . tamsulosin  0.4 mg Oral QHS   Continuous Infusions: . sodium chloride       LOS: 3 days    Time spent: 25 minutes. Greater than 50% of this time was spent in direct contact with the patient, coordinating care and discussing relevant ongoing clinical issues.     Lelon Frohlich, MD [facility] Pager [phone]  If 7PM-7AM, please contact night-coverage [email or web address] Password [facility] 03/25/2018, 3:02 PM

## 2018-03-25 NOTE — Consult Note (Signed)
Urology Consult  Referring physician: Dr. Wynetta Emery Reason for referral: gross hematuria from left nephrostomy tube  Chief Complaint: gross hematuria  History of Present Illness: Mr Cawood is a 57yo with a history of CKD stage 4, HTN, RPF who was admitted with gross hematuria and a hemoglobin of 6. At the time of admission he was has gross hematuria when he urinated and also hematuria in his left nephrostomy tube bag. He now only has gross hematuria in his nephrostomy tube bag. He was diagnosed with retroperitoneal fibrosis in 2008 an due to his multiple medical comorbidities he never underwent ureterolysis. He denies any flank pain. He denies any worsening lower urinary tract symptoms.  No other associated symptoms. No exacerbating/alleviaitng events.  Urine culture is growing e coli.   Past Medical History:  Diagnosis Date  . Acute unilateral obstructive uropathy   . Chronic kidney disease, stage IV (severe) ([place])   . Constipation   . Coronary atherosclerosis of native coronary artery   . Diabetes mellitus without complication ([place])   . Enlarged prostate with urinary obstruction   . Essential hypertension, malignant   . GERD (gastroesophageal reflux disease)   . Glaucoma   . Hemiparesis affecting right side as late effect of cerebrovascular accident ([place])   . Hyperlipidemia   . Hypertension   . Hypokalemia   . Muscle weakness (generalized)   . Retroperitoneal fibrosis   . Stricture or kinking of ureter   . Stroke [facility])    right sided hemiparesis  . Vitamin D deficiency    Past Surgical History:  Procedure Laterality Date  . IR GENERIC HISTORICAL  11/09/2016   IR NEPHROSTOMY EXCHANGE LEFT 11/09/2016 Corrie Mckusick, DO [facility]-INTERV RAD  . IR GENERIC HISTORICAL  01/09/2017   IR NEPHROSTOMY EXCHANGE LEFT 01/09/2017 [name], MD [facility]-INTERV RAD  . IR NEPHROSTOMY EXCHANGE LEFT  03/06/2017  . IR NEPHROSTOMY EXCHANGE LEFT  04/13/2017  . IR NEPHROSTOMY EXCHANGE LEFT  06/08/2017  . IR NEPHROSTOMY  EXCHANGE LEFT  07/20/2017  . IR NEPHROSTOMY EXCHANGE LEFT  08/03/2017  . IR NEPHROSTOMY EXCHANGE LEFT  09/28/2017  . IR NEPHROSTOMY EXCHANGE LEFT  11/29/2017  . IR NEPHROSTOMY EXCHANGE LEFT  01/31/2018  . NEPHROSTOMY TUBE PLACEMENT ([place] HX)      Medications: I have reviewed the patient's current medications. Allergies:  Allergies  Allergen Reactions  . Other     TB serum-reaction unknown. Provided via MAR records     Family History  Family history unknown: Yes   Social History:  reports that he has never smoked. He has never used smokeless tobacco. He reports that he drank alcohol. He reports that he has current or past drug history.  Review of Systems  Genitourinary: Positive for hematuria.  All other systems reviewed and are negative.   Physical Exam:  Vital signs in last 24 hours: Temp:  [97.9 F (36.6 C)-98.3 F (36.8 C)] 97.9 F (36.6 C) (04/22 2125) Pulse Rate:  [85-89] 89 (04/22 2125) Resp:  [18-20] 18 (04/22 1306) BP: (107-127)/(79-89) 121/87 (04/22 2125) SpO2:  [98 %-100 %] 100 % (04/22 2125) Weight:  [109.8 kg (242 lb 1 oz)] 109.8 kg (242 lb 1 oz) (04/22 0434) Physical Exam  Constitutional: He is oriented to person, place, and time. He appears well-developed and well-nourished.  HENT:  Head: Normocephalic and atraumatic.  Eyes: Pupils are equal, round, and reactive to light. EOM are normal.  Neck: Normal range of motion. No thyromegaly present.  Cardiovascular: Normal rate and regular rhythm.  Respiratory:  Effort normal. No respiratory distress.  GI: Soft. He exhibits no distension.  Genitourinary: Penis normal.  Musculoskeletal: Normal range of motion. He exhibits no edema.  Neurological: He is alert and oriented to person, place, and time.  Skin: Skin is warm and dry.  Psychiatric: He has a normal mood and affect. His behavior is normal. Judgment and thought content normal.    Laboratory Data:  Results for orders placed or performed during the hospital  encounter of 03/22/18 (from the past 72 hour(s))  MRSA PCR Screening     Status: None   Collection Time: 03/22/18 11:35 PM  Result Value Ref Range   MRSA by PCR NEGATIVE NEGATIVE    Comment:        The GeneXpert MRSA Assay (FDA approved for NASAL specimens only), is one component of a comprehensive MRSA colonization surveillance program. It is not intended to diagnose MRSA infection nor to guide or monitor treatment for MRSA infections. Performed at [facility], [address], [place], [place] [address]   Vitamin B12     Status: None   Collection Time: 03/23/18  4:02 AM  Result Value Ref Range   Vitamin B-12 253 180 - 914 pg/mL    Comment: (NOTE) This assay is not validated for testing neonatal or myeloproliferative syndrome specimens for Vitamin B12 levels. Performed at [facility], [place] [address], [place], [place] [address]   Iron and TIBC     Status: Abnormal   Collection Time: 03/23/18  4:02 AM  Result Value Ref Range   Iron 28 (L) 45 - 182 ug/dL   TIBC 305 250 - 450 ug/dL   Saturation Ratios 9 (L) 17.9 - 39.5 %   UIBC 277 ug/dL    Comment: Performed at [facility], [place] [address] [place], [place] [address]  Ferritin     Status: Abnormal   Collection Time: 03/23/18  4:02 AM  Result Value Ref Range   Ferritin 14 (L) 24 - 336 ng/mL    Comment: Performed at [facility], [place] [address] [place], [place] [address]  HIV antibody (Routine Testing)     Status: None   Collection Time: 03/23/18  4:03 AM  Result Value Ref Range   HIV Screen 4th Generation wRfx Non Reactive Non Reactive    Comment: (NOTE) Performed At: [facility] [place], [place] [id number] Rush Farmer MD MV:[id number] Performed at [facility], [address], [place], [place] [address]   Basic metabolic panel     Status: Abnormal   Collection Time: 03/23/18  4:03 AM  Result Value Ref Range   Sodium 142 135 - 145 mmol/L   Potassium 3.9 3.5 - 5.1 mmol/L    Chloride 107 101 - 111 mmol/L   CO2 25 22 - 32 mmol/L   Glucose, Bld 86 65 - 99 mg/dL   BUN 19 6 - 20 mg/dL   Creatinine, Ser 2.30 (H) 0.61 - 1.24 mg/dL   Calcium 8.8 (L) 8.9 - 10.3 mg/dL   GFR calc non Af Amer 30 (L) >60 mL/min   GFR calc Af Amer 34 (L) >60 mL/min    Comment: (NOTE) The eGFR has been calculated using the CKD EPI equation. This calculation has not been validated in all clinical situations. eGFR's persistently <60 mL/min signify possible Chronic Kidney Disease.    Anion gap 10 5 - 15    Comment: Performed at [facility], [address] [place], [place] [address]  Folate     Status: None   Collection Time: 03/23/18  4:03 AM  Result Value Ref Range   Folate 6.6 >5.9 ng/mL    Comment: Performed at [facility], [place] [address], [place], [place] [address]  CBC     Status: Abnormal   Collection Time: 03/23/18  4:03 AM  Result Value Ref Range   WBC 5.4 4.0 - 10.5 K/uL   RBC 3.35 (L) 4.22 - 5.81 MIL/uL   Hemoglobin 7.6 (L) 13.0 - 17.0 g/dL   HCT 25.5 (L) 39.0 - 52.0 %   MCV 76.1 (L) 78.0 - 100.0 fL   MCH 22.7 (L) 26.0 - 34.0 pg   MCHC 29.8 (L) 30.0 - 36.0 g/dL   RDW 16.4 (H) 11.5 - 15.5 %   Platelets 298 150 - 400 K/uL    Comment: Performed at [facility], [address], [place], [place] [address]  Prepare RBC     Status: None   Collection Time: 03/23/18  9:32 AM  Result Value Ref Range   Order Confirmation      ORDER PROCESSED BY BLOOD BANK Performed at [facility], [address] [place], [place] [address]   Urinalysis, Routine w reflex microscopic     Status: Abnormal   Collection Time: 03/23/18  2:00 PM  Result Value Ref Range   Color, Urine AMBER (A) YELLOW   APPearance TURBID (A) CLEAR   Specific Gravity, Urine 1.012 1.005 - 1.030   pH 7.0 5.0 - 8.0   Glucose, UA NEGATIVE NEGATIVE mg/dL   Hgb urine dipstick LARGE (A) NEGATIVE   Bilirubin Urine NEGATIVE NEGATIVE   Ketones, ur NEGATIVE NEGATIVE mg/dL   Protein, ur 100 (A) NEGATIVE mg/dL    Nitrite NEGATIVE NEGATIVE   Leukocytes, UA MODERATE (A) NEGATIVE   RBC / HPF TOO NUMEROUS TO COUNT 0 - 5 RBC/hpf   WBC, UA TOO NUMEROUS TO COUNT 0 - 5 WBC/hpf   Bacteria, UA NONE SEEN NONE SEEN   Squamous Epithelial / LPF 6-30 (A) NONE SEEN   WBC Clumps PRESENT     Comment: Performed at [facility], [address], [place], [place] [address]  Culture, Urine     Status: Abnormal (Preliminary result)   Collection Time: 03/23/18  2:00 PM  Result Value Ref Range   Specimen Description      URINE, CLEAN CATCH Performed at [facility], [address] [place], [place] [address]    Special Requests      NONE Performed at [facility], [address] [place], [place] [address]    Culture (A)     >=100,000 COLONIES/mL ESCHERICHIA COLI REPEATING SENSITIVITIES Performed at [facility] [address], [place], [place] [address]    Report Status PENDING   Hemoglobin and hematocrit, blood     Status: Abnormal   Collection Time: 03/23/18  9:09 PM  Result Value Ref Range   Hemoglobin 9.5 (L) 13.0 - 17.0 g/dL   HCT 30.6 (L) 39.0 - 52.0 %    Comment: Performed at [facility], [address] [place], [place] [address]  CBC     Status: Abnormal   Collection Time: 03/24/18  5:04 AM  Result Value Ref Range   WBC 5.9 4.0 - 10.5 K/uL   RBC 4.05 (L) 4.22 - 5.81 MIL/uL   Hemoglobin 9.6 (L) 13.0 - 17.0 g/dL   HCT 31.1 (L) 39.0 - 52.0 %   MCV 76.8 (L) 78.0 - 100.0 fL  MCH 23.7 (L) 26.0 - 34.0 pg   MCHC 30.9 30.0 - 36.0 g/dL   RDW 16.3 (H) 11.5 - 15.5 %   Platelets 262 150 - 400 K/uL    Comment: Performed at [facility], [address], [place], [place] [address]  Basic metabolic panel     Status: Abnormal   Collection Time: 03/25/18  4:51 AM  Result Value Ref Range   Sodium 140 135 - 145 mmol/L   Potassium 4.0 3.5 - 5.1 mmol/L   Chloride 106 101 - 111 mmol/L   CO2 23 22 - 32 mmol/L   Glucose, Bld 89 65 - 99 mg/dL   BUN 22 (H) 6 - 20 mg/dL   Creatinine, Ser 2.39 (H) 0.61 - 1.24  mg/dL   Calcium 8.9 8.9 - 10.3 mg/dL   GFR calc non Af Amer 28 (L) >60 mL/min   GFR calc Af Amer 33 (L) >60 mL/min    Comment: (NOTE) The eGFR has been calculated using the CKD EPI equation. This calculation has not been validated in all clinical situations. eGFR's persistently <60 mL/min signify possible Chronic Kidney Disease.    Anion gap 11 5 - 15    Comment: Performed at [facility], [address] [place], [place] [address]  CBC     Status: Abnormal   Collection Time: 03/25/18  4:51 AM  Result Value Ref Range   WBC 6.7 4.0 - 10.5 K/uL   RBC 4.07 (L) 4.22 - 5.81 MIL/uL   Hemoglobin 9.6 (L) 13.0 - 17.0 g/dL   HCT 31.5 (L) 39.0 - 52.0 %   MCV 77.4 (L) 78.0 - 100.0 fL   MCH 23.6 (L) 26.0 - 34.0 pg   MCHC 30.5 30.0 - 36.0 g/dL   RDW 16.6 (H) 11.5 - 15.5 %   Platelets 254 150 - 400 K/uL    Comment: Performed at [facility], [address], [place], [place] [address]   Recent Results (from the past 240 hour(s))  MRSA PCR Screening     Status: None   Collection Time: 03/22/18 11:35 PM  Result Value Ref Range Status   MRSA by PCR NEGATIVE NEGATIVE Final    Comment:        The GeneXpert MRSA Assay (FDA approved for NASAL specimens only), is one component of a comprehensive MRSA colonization surveillance program. It is not intended to diagnose MRSA infection nor to guide or monitor treatment for MRSA infections. Performed at [facility], [address], [place], [place] [address]   Culture, Urine     Status: Abnormal (Preliminary result)   Collection Time: 03/23/18  2:00 PM  Result Value Ref Range Status   Specimen Description   Final    URINE, CLEAN CATCH Performed at [facility], [address], [place], [place] [address]    Special Requests   Final    NONE Performed at [facility], [address] [place], [place] [address]    Culture (A)  Final    >=100,000 COLONIES/mL ESCHERICHIA COLI REPEATING SENSITIVITIES Performed at [facility] [address] [place], [place] [address]    Report Status PENDING  Incomplete   Creatinine: Recent Labs    03/22/18 1910 03/23/18 0403 03/25/18 0451  CREATININE 2.34* 2.30* 2.39*   Baseline Creatinine: 2.3  Impression/Assessment:  56yo with gross hematuria, chronic indwelling left nephrostomy tube  Plan:  1. Gross hematuria: I discussed the various causes of gross hematuria with the patient. The hematuria may be due  to the e coli which grew from his urine culture. Please treat the culture with 7 days of culture specific antibiotics.   Nicolette Bang 03/25/2018, 10:41 PM

## 2018-03-26 ENCOUNTER — Inpatient Hospital Stay (HOSPITAL_COMMUNITY): Payer: Medicaid Other

## 2018-03-26 LAB — CBC
HCT: 33.1 % — ABNORMAL LOW (ref 39.0–52.0)
Hemoglobin: 9.9 g/dL — ABNORMAL LOW (ref 13.0–17.0)
MCH: 23.3 pg — ABNORMAL LOW (ref 26.0–34.0)
MCHC: 29.9 g/dL — AB (ref 30.0–36.0)
MCV: 78.1 fL (ref 78.0–100.0)
Platelets: 244 10*3/uL (ref 150–400)
RBC: 4.24 MIL/uL (ref 4.22–5.81)
RDW: 17.2 % — AB (ref 11.5–15.5)
WBC: 6.7 10*3/uL (ref 4.0–10.5)

## 2018-03-26 LAB — URINE CULTURE: Culture: >=100000 — AB

## 2018-03-26 MED ORDER — SODIUM CHLORIDE 0.9 % IV SOLN: 1.0000 g | Freq: Two times a day (BID) | INTRAVENOUS | Status: AC

## 2018-03-26 MED ORDER — SODIUM CHLORIDE 0.9 % IV SOLN
1.0000 g | Freq: Two times a day (BID) | INTRAVENOUS | Status: DC
  Administered 2018-03-26: 1 g via INTRAVENOUS
  Filled 2018-03-26 (×4): qty 1

## 2018-03-26 MED ORDER — SODIUM CHLORIDE 0.9% FLUSH
10.0000 mL | INTRAVENOUS | Status: DC | PRN
  Administered 2018-03-26: 10 mL
  Filled 2018-03-26: qty 40

## 2018-03-26 NOTE — Progress Notes (Signed)
Pt discharging back to [facility] today per Dr. Jerilee Hoh. Pt's PICC to Left Upper Arm patent, flushed, blood return noted and WDL. VSS. Report called to Levada Dy, nurse at [facility]. Verbalized understanding. Pt currently awaiting the arrival of EMS to transport back to facility.

## 2018-03-26 NOTE — Progress Notes (Signed)
Peripherally Inserted Central Catheter/Midline Placement  The IV Nurse has discussed with the patient and/or persons authorized to consent for the patient, the purpose of this procedure and the potential benefits and risks involved with this procedure.  The benefits include less needle sticks, lab draws from the catheter, and the patient may be discharged home with the catheter. Risks include, but not limited to, infection, bleeding, blood clot (thrombus formation), and puncture of an artery; nerve damage and irregular heartbeat and possibility to perform a PICC exchange if needed/ordered by physician.  Alternatives to this procedure were also discussed.  Bard Power PICC patient education guide, fact sheet on infection prevention and patient information card has been provided to patient /or left at bedside.    PICC/Midline Placement Documentation  PICC Single Lumen 65/78/46 PICC Left Basilic 42 cm 0 cm (Active)  Indication for Insertion or Continuance of Line Home intravenous therapies (PICC only) 03/26/2018 12:00 PM  Exposed Catheter (cm) 0 cm 03/26/2018 12:00 PM  Site Assessment Clean;Intact;Dry 03/26/2018 12:00 PM  Line Status Flushed;Blood return noted 03/26/2018 12:00 PM  Dressing Type Transparent 03/26/2018 12:00 PM  Dressing Status Clean;Dry;Intact;Antimicrobial disc in place 03/26/2018 12:00 PM  Dressing Intervention New dressing 03/26/2018 12:00 PM  Dressing Change Due 04/02/18 03/26/2018 12:00 PM       Brendan [name] 03/26/2018, 12:21 PM

## 2018-03-26 NOTE — NC FL2 (Signed)
[place] LEVEL OF CARE SCREENING TOOL     IDENTIFICATION  [name] Birthdate: 21-Sep-1961 Sex: male Admission Date (Current Location): 03/22/2018  [facility] and [place] Number:  [employer] and Address:  [place] [address] [place]      Provider Number: [id number]  Attending Physician Name and Address:  Isaac Bliss, [place]  Relative Name and Phone Number:  [name] [phone] (sister)    Current Level of Care: Hospital Recommended Level of Care: [place] Prior Approval Number:    Date Approved/Denied:   PASRR Number:    Discharge Plan: SNF    Current Diagnoses: Patient Active Problem List   Diagnosis Date Noted  . Symptomatic anemia 03/22/2018  . History of DVT in adulthood 03/22/2018  . Gross hematuria   . Nephrostomy tube displaced ([place]) 07/19/2017  . Pyelonephritis 02/16/2017  . 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 Coronary atherosclerosis of native coronary artery   . Essential hypertension   . Stroke ([place])   . Diabetes mellitus without complication ([place])   . Chronic kidney disease, stage IV (severe) ([place])   . GERD (gastroesophageal reflux disease)   . Glaucoma   . Hemiparesis affecting right side as late effect of cerebrovascular accident ([place])   . Enlarged prostate with urinary obstruction   . Constipation     Orientation RESPIRATION BLADDER Height & Weight     Self, Time, Situation, Place    Continent Weight: 242 lb 1 oz (109.8 kg) Height:  6\' 1"  (185.4 cm)  BEHAVIORAL SYMPTOMS/MOOD NEUROLOGICAL BOWEL NUTRITION STATUS      Continent Diet(see discharge summary)  AMBULATORY STATUS COMMUNICATION OF NEEDS Skin   Extensive Assist Verbally Normal                       Personal Care Assistance Level of Assistance  Bathing, Feeding, Dressing Bathing Assistance: Maximum assistance Feeding assistance: Limited assistance Dressing Assistance:  Maximum assistance     Functional Limitations Info  Sight, Hearing, Speech Sight Info: Adequate Hearing Info: Adequate Speech Info: Adequate    SPECIAL CARE FACTORS FREQUENCY                       Contractures Contractures Info: Not present    Additional Factors Info  Code Status, Allergies Code Status Info: full             Current Medications (03/26/2018):  This is the current hospital active medication list Current Facility-Administered Medications  Medication Dose Route Frequency Provider Last Rate Last Dose  . 0.9 %  sodium chloride infusion  250 mL Intravenous PRN Opyd, Ilene Qua, MD      . acetaminophen (TYLENOL) tablet 650 mg  650 mg Oral Q6H PRN Opyd, Ilene Qua, MD       Or  . acetaminophen (TYLENOL) suppository 650 mg  650 mg Rectal Q6H PRN Opyd, Ilene Qua, MD      . calcium carbonate (OS-CAL - dosed in mg of elemental calcium) tablet 500 mg of elemental calcium  1 tablet Oral BID Opyd, Ilene Qua, MD   500 mg of elemental calcium at 03/26/18 0838  . cholecalciferol (VITAMIN D) tablet 1,000 Units  1,000 Units Oral Daily Opyd, Ilene Qua, MD   1,000 Units at 03/26/18 [id number]  . docusate sodium (COLACE) capsule 100 mg  100 mg Oral Daily Opyd, Ilene Qua, MD   100 mg at 03/26/18 0839  .  furosemide (LASIX) tablet 20 mg  20 mg Oral Daily Opyd, Ilene Qua, MD   20 mg at 03/26/18 8295  . HYDROcodone-acetaminophen (NORCO/VICODIN) 5-325 MG per tablet 1-2 tablet  1-2 tablet Oral Q4H PRN Opyd, Ilene Qua, MD      . meropenem (MERREM) 1 g in sodium chloride 0.9 % 100 mL IVPB  1 g Intravenous Q12H Isaac Bliss, Rayford Halsted, MD      . mycophenolate (CELLCEPT) capsule 500 mg  500 mg Oral BID Vianne Bulls, MD   500 mg at 03/26/18 6213  . ondansetron (ZOFRAN) tablet 4 mg  4 mg Oral Q6H PRN Opyd, Ilene Qua, MD       Or  . ondansetron (ZOFRAN) injection 4 mg  4 mg Intravenous Q6H PRN Opyd, Ilene Qua, MD      . senna (SENOKOT) tablet 8.6 mg  1 tablet Oral BID Opyd, Ilene Qua, MD    8.6 mg at 03/26/18 0839  . simvastatin (ZOCOR) tablet 40 mg  40 mg Oral q1800 Opyd, Ilene Qua, MD   40 mg at 03/25/18 1709  . sodium bicarbonate tablet 650 mg  650 mg Oral TID Vianne Bulls, MD   650 mg at 03/26/18 0865  . sodium chloride flush (NS) 0.9 % injection 3 mL  3 mL Intravenous Q12H Opyd, Ilene Qua, MD   3 mL at 03/26/18 0839  . sodium chloride flush (NS) 0.9 % injection 3 mL  3 mL Intravenous PRN Opyd, Ilene Qua, MD      . tamsulosin (FLOMAX) capsule 0.4 mg  0.4 mg Oral QHS Opyd, Ilene Qua, MD   0.4 mg at 03/25/18 2126     Discharge Medications: Please see discharge summary for a list of discharge medications.  Relevant Imaging Results:  Relevant Lab Results:   Additional Information    Shade Flood, LCSW

## 2018-03-26 NOTE — Clinical Social Work Note (Signed)
Clinical Social Work Assessment  Patient Details  Name: Brendan [name] MRN: [id number] Date of Birth: [birth year]  Date of referral:  03/26/18               Reason for consult:  Discharge Planning                Permission sought to share information with:    Permission granted to share information::     Name::        Agency::     Relationship::     Contact Information:     Housing/Transportation Living arrangements for the past 2 months:  [place] of Information:  Facility Patient Interpreter Needed:  None Criminal Activity/Legal Involvement Pertinent to Current Situation/Hospitalization:  No - Comment as needed Significant Relationships:  Siblings Lives with:  Facility Resident Do you feel safe going back to the place where you live?  Yes Need for family participation in patient care:  Yes (Comment)  Care giving concerns: Pt resides at a SNF   Social Worker assessment / plan: Pt is a 57 year old male admitted from [facility]. Pt is a long term care resident and he will return there at dc. Pt needs assistance with all ADL's. Per MD, pt will need 5-7 days of IV antibiotic at dc. Contacted Conservation officer, historic buildings at [facility] to update. They request a PICC line. Updated MD. Will arrange EMS transport when pt stable for dc.  Employment status:  Disabled (Comment on whether or not currently receiving Disability) Insurance information:  Medicaid In [place] PT Recommendations:  Not assessed at this time Information / Referral to community resources:     Patient/Family's Response to care: Pt accepting of care.  Patient/Family's Understanding of and Emotional Response to Diagnosis, Current Treatment, and Prognosis: Pt appears to have a good understanding of diagnosis and treatment recommendations. No emotional distress identified.  Emotional Assessment Appearance:  Appears stated age Attitude/Demeanor/Rapport:    Affect (typically observed):  Calm Orientation:  Oriented to  Self, Oriented to Place, Oriented to  Time, Oriented to Situation Alcohol / Substance use:  Not Applicable Psych involvement (Current and /or in the community):     Discharge Needs  Concerns to be addressed:  Discharge Planning Concerns Readmission within the last 30 days:  No Current discharge risk:  None Barriers to Discharge:  No Barriers Identified   Shade Flood, LCSW 03/26/2018, 11:21 AM

## 2018-03-26 NOTE — Discharge Summary (Signed)
Physician Discharge Summary  Brendan [name] YHC:[id number] DOB: Jan 28, 1961 DOA: 03/22/2018  PCP: Cleda Mccreedy, MD  Admit date: 03/22/2018 Discharge date: 03/26/2018  Time spent: 45 minutes  Recommendations for Outpatient Follow-up:  -To be discharged back to SNF today. -To complete 7 days of meropenem via PICC line for an ESBL E. coli UTI. -For now Xarelto is on hold due to significant hematuria, will need to determine VT E history to make decision on whether to discontinue Xarelto indefinitely.  Discharge Diagnoses:  Principal Problem:   Symptomatic anemia Active Problems:   Coronary atherosclerosis of native coronary artery   Essential hypertension   Diabetes mellitus without complication (HCC)   Chronic kidney disease, stage IV (severe) (HCC)   Hemiparesis affecting right side as late effect of cerebrovascular accident [facility])   Retroperitoneal fibrosis   History of DVT in adulthood   Discharge Condition: Stable and improved  Filed Weights   03/23/18 0501 03/24/18 0543 03/25/18 0434  Weight: 108.7 kg (239 lb 10.2 oz) 111.3 kg (245 lb 6 oz) 109.8 kg (242 lb 1 oz)    History of present illness:  As per Dr. Myna Hidalgo on 4/19: Brendan [name] is a 57 y.o. 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  Patient reports that he has been noted gross blood when he urinates, and in his nephrostomy bag recently and has noted the insidious development of generalized weakness in recent days.  Blood work was performed at his nursing facility and he was found to have hemoglobin just over 6.  He was directed to the ED for further evaluation of this.  He denies any abdominal pain, melena, or hematochezia.  ED Course: Upon arrival to the  ED, patient is found to be afebrile, saturating well on room air, and with vitals otherwise stable.  Chemistry panel is notable for creatinine 2.34, consistent with his apparent baseline.  CT of the abdomen and pelvis is negative for any acute intra-abdominal or pelvic abnormality, including no hydronephrosis.  Type and screen was performed and 1 unit of packed red blood cells was ordered for immediate transfusion.  Patient remains hemodynamically stable and will be admitted to the medical-surgical unit for ongoing evaluation and management of gross hematuria with symptomatic anemia.    Hospital Course:   Acute blood loss anemia -Due to gross hematuria in his nephrostomy bag. -On admission he reported that this has been ongoing for the last 6 months. -He has received a total of 3 units of PRBCs, his hemoglobin has been stable overnight in the mid 9 range. -Per urology, no further workup is indicated other than treatment of his presumed UTI. -Has a history of ureteral obstruction due to idiopathic retroperitoneal fibrosis.  Presumed UTI -Likely due to indwelling nephrostomy tube. -UA was remarkably not indicative of a UTI, however urine culture has grown ESBL E. coli, patient has been asymptomatic. -Nonetheless given degree of hematuria he presented with, agree with treatment. -PICC line has been placed for a 7-day course of meropenem. -This will be completed at his SNF.  History of DVT -Xarelto is currently on hold due to gross hematuria that is multi-unit transfusion dependent. -For now we will elect to hold Xarelto, however I feel like we need to dig into his history of DVT, he tells me  that he thinks his blood clot was about 4 years ago, if this is the case and he has not had a recurrent VTE, then I believe Xarelto can probably be safely discontinued.  History of CVA -At baseline.  Stage IV chronic kidney disease -Stable, at  baseline    Procedures:  None  Consultations:  Urology  Discharge Instructions  Discharge Instructions    Diet - low sodium heart healthy   Complete by:  As directed    Increase activity slowly   Complete by:  As directed      Allergies as of 03/26/2018      Reactions   Other    TB serum-reaction unknown. Provided via [facility] records      Medication List    STOP taking these medications   rivaroxaban 20 MG Tabs tablet Commonly known as:  XARELTO     TAKE these medications   acetaminophen 325 MG tablet Commonly known as:  TYLENOL Take 650 mg by mouth 3 (three) times daily as needed for mild pain or moderate pain.   CALTRATE 600 1500 (600 Ca) MG Tabs tablet Generic drug:  calcium carbonate Take 1 tablet by mouth 2 (two) times daily.   Cholecalciferol 1000 units tablet Take 1,000 Units by mouth daily.   docusate sodium 100 MG capsule Commonly known as:  COLACE Take 100 mg by mouth daily.   Fish Oil 1000 MG Caps Take 1 capsule by mouth daily.   furosemide 20 MG tablet Commonly known as:  LASIX Take 20 mg by mouth daily.   meropenem 1 g in sodium chloride 0.9 % 100 mL Inject 1 g into the vein every 12 (twelve) hours for 7 days.   mycophenolate 500 MG tablet Commonly known as:  CELLCEPT Take 500 mg by mouth 2 (two) times daily.   senna 8.6 MG Tabs tablet Commonly known as:  SENOKOT Take 1 tablet by mouth 2 (two) times daily.   simvastatin 40 MG tablet Commonly known as:  ZOCOR Take 40 mg by mouth at bedtime.   sodium bicarbonate 650 MG tablet Take 650 mg by mouth 3 (three) times daily.   tamsulosin 0.4 MG Caps capsule Commonly known as:  FLOMAX Take 0.4 mg by mouth at bedtime.      Allergies  Allergen Reactions  . Other     TB serum-reaction unknown. Provided via [facility] records    Follow-up Information    Cleda Mccreedy, MD. Schedule an appointment as soon as possible for a visit in 2 week(s).   Specialty:  Internal Medicine Contact  information: [place] [phone]            The results of significant diagnostics from this hospitalization (including imaging, microbiology, ancillary and laboratory) are listed below for reference.    Significant Diagnostic Studies: Ct Abdomen Pelvis Wo Contrast  Result Date: 03/22/2018 CLINICAL DATA:  Per ED notes: Pt brought in by [facility] from [facility]. Labwork was done at facility recently and told that hemoglobin was 6.3. Pt denies dark black stools and bloody stools. Pt reports weakness. BP 124/70,.*comment was truncated* EXAM: CT ABDOMEN AND PELVIS WITHOUT CONTRAST TECHNIQUE: Multidetector CT imaging of the abdomen and pelvis was performed following the standard protocol without IV contrast. COMPARISON:  CT 08/03/2017 FINDINGS: Lower chest: Lung bases are clear. Hepatobiliary: No focal hepatic lesions noncontrast exam. Pancreas: Pancreas is normal. No ductal dilatation. No pancreatic inflammation. Spleen: Normal spleen Adrenals/urinary tract: Adrenal glands  normal. Percutaneous nephrostomy tube extends into the LEFT renal hilum. No hydronephrosis. Mild prominence of the RIGHT renal pelvis. There is RIGHT kidney is atrophic. Ureters are difficult to follow through the retroperitoneal fibrosis. Stomach/Bowel: Stomach, small bowel, appendix, and cecum are normal. The colon and rectosigmoid colon are normal. Vascular/Lymphatic: Abdominal aorta is surrounded by a mat of confluent soft tissue density highly suggestive of retroperitoneal fibrosis. No change from comparison Reproductive: Prostate normal Other: No free fluid. Musculoskeletal: No aggressive osseous lesion. IMPRESSION: 1. No explanation for bloody stools. No diverticulosis or diverticulitis. 2. No acute findings the abdomen pelvis. 3. Percutaneous nephrostomy tube in the LEFT kidney without evidence of hydronephrosis. 4. Again demonstrated retroperitoneal fibrosis pattern.  Electronically Signed   By: Suzy Bouchard M.D.   On: 03/22/2018 19:01   Dg Chest Port 1 View  Result Date: 03/26/2018 CLINICAL DATA:  Status post left-sided PICC line placement. EXAM: PORTABLE CHEST 1 VIEW COMPARISON:  Portable chest x-ray of August 03, 2017 FINDINGS: The left sided PICC line tip projects over the proximal third of the SVC. There is no postprocedure complication. The lungs are well-expanded and clear. The heart and pulmonary vascularity are normal. IMPRESSION: There is no immediate postprocedure complication following PICC line placement via the left upper extremity. Electronically Signed   By: David  Martinique M.D.   On: 03/26/2018 13:00    Microbiology: Recent Results (from the past 240 hour(s))  MRSA PCR Screening     Status: None   Collection Time: 03/22/18 11:35 PM  Result Value Ref Range Status   MRSA by PCR NEGATIVE NEGATIVE Final    Comment:        The GeneXpert MRSA Assay (FDA approved for NASAL specimens only), is one component of a comprehensive MRSA colonization surveillance program. It is not intended to diagnose MRSA infection nor to guide or monitor treatment for MRSA infections. Performed at [facility], [address] [place], [place] [address]   Culture, Urine     Status: Abnormal   Collection Time: 03/23/18  2:00 PM  Result Value Ref Range Status   Specimen Description   Final    URINE, CLEAN CATCH Performed at [facility], [address] [place], [place] [address]    Special Requests   Final    NONE Performed at [facility], [address], [place], [place] [address]    Culture >=100,000 COLONIES/mL ESCHERICHIA COLI (A)  Final   Report Status 03/26/2018 FINAL  Final   Organism ID, Bacteria ESCHERICHIA COLI (A)  Final      Susceptibility   Escherichia coli - MIC*    AMPICILLIN >=32 RESISTANT Resistant     CEFAZOLIN >=64 RESISTANT Resistant     CEFTRIAXONE >=64 RESISTANT Resistant     CIPROFLOXACIN >=4 RESISTANT Resistant      GENTAMICIN <=1 SENSITIVE Sensitive     IMIPENEM <=0.25 SENSITIVE Sensitive     NITROFURANTOIN <=16 SENSITIVE Sensitive     TRIMETH/SULFA >=320 RESISTANT Resistant     AMPICILLIN/SULBACTAM >=32 RESISTANT Resistant     PIP/TAZO 8 SENSITIVE Sensitive     Extended ESBL POSITIVE Resistant     * >=100,000 COLONIES/mL ESCHERICHIA COLI     Labs: Basic Metabolic Panel: Recent Labs  Lab 03/22/18 1910 03/23/18 0403 03/25/18 0451  NA 140 142 140  K 4.3 3.9 4.0  CL 105 107 106  CO2 24 25 23   GLUCOSE 90 86 89  BUN 20 19 22*  CREATININE 2.34* 2.30* 2.39*  CALCIUM 8.9 8.8* 8.9  Liver Function Tests: Recent Labs  Lab 03/22/18 1910  AST 16  ALT 11*  ALKPHOS 63  BILITOT 0.4  PROT 7.6  ALBUMIN 3.5   No results for input(s): LIPASE, AMYLASE in the last 168 hours. No results for input(s): AMMONIA in the last 168 hours. CBC: Recent Labs  Lab 03/22/18 1910 03/23/18 0403 03/23/18 2109 03/24/18 0504 03/25/18 0451 03/26/18 0645  WBC 6.3 5.4  --  5.9 6.7 6.7  NEUTROABS 3.4  --   --   --   --   --   HGB 6.8* 7.6* 9.5* 9.6* 9.6* 9.9*  HCT 23.2* 25.5* 30.6* 31.1* 31.5* 33.1*  MCV 74.6* 76.1*  --  76.8* 77.4* 78.1  PLT 298 298  --  262 254 244   Cardiac Enzymes: No results for input(s): CKTOTAL, CKMB, CKMBINDEX, TROPONINI in the last 168 hours. BNP: BNP (last 3 results) No results for input(s): BNP in the last 8760 hours.  ProBNP (last 3 results) No results for input(s): PROBNP in the last 8760 hours.  CBG: No results for input(s): GLUCAP in the last 168 hours.     Signed:  Lelon Frohlich  [facility] Pager: [phone] 03/26/2018, 2:41 PM

## 2018-04-04 ENCOUNTER — Ambulatory Visit (HOSPITAL_COMMUNITY)
Admission: RE | Admit: 2018-04-04 | Discharge: 2018-04-04 | Disposition: A | Discharge status: Home / Self Care | Payer: Medicaid Other | Source: Ambulatory Visit | Attending: Urology | Admitting: Urology

## 2018-04-04 ENCOUNTER — Other Ambulatory Visit (HOSPITAL_COMMUNITY): Payer: Self-pay | Admitting: Interventional Radiology

## 2018-04-04 ENCOUNTER — Encounter (HOSPITAL_COMMUNITY): Payer: Self-pay | Admitting: Interventional Radiology

## 2018-04-04 DIAGNOSIS — Z436 Encounter for attention to other artificial openings of urinary tract: Secondary | ICD-10-CM | POA: Insufficient documentation

## 2018-04-04 DIAGNOSIS — N1330 Unspecified hydronephrosis: Secondary | ICD-10-CM

## 2018-04-04 DIAGNOSIS — N135 Crossing vessel and stricture of ureter without hydronephrosis: Secondary | ICD-10-CM | POA: Diagnosis not present

## 2018-04-04 HISTORY — PX: IR NEPHROSTOMY EXCHANGE LEFT: IMG6069

## 2018-04-04 MED ORDER — LIDOCAINE HCL 1 % IJ SOLN
INTRAMUSCULAR | Status: AC
  Filled 2018-04-04: qty 20

## 2018-04-04 MED ORDER — LIDOCAINE HCL 1 % IJ SOLN
INTRAMUSCULAR | Status: DC | PRN
  Administered 2018-04-04: 5 mL

## 2018-04-04 MED ORDER — IOPAMIDOL (ISOVUE-300) INJECTION 61%
INTRAVENOUS | Status: AC
  Administered 2018-04-04: 10 mL
  Filled 2018-04-04: qty 50

## 2018-06-20 ENCOUNTER — Other Ambulatory Visit (HOSPITAL_COMMUNITY): Payer: Medicaid Other

## 2018-06-27 ENCOUNTER — Other Ambulatory Visit (HOSPITAL_COMMUNITY): Payer: Self-pay | Admitting: Interventional Radiology

## 2018-06-27 ENCOUNTER — Ambulatory Visit (HOSPITAL_COMMUNITY)
Admission: RE | Admit: 2018-06-27 | Discharge: 2018-06-27 | Disposition: A | Discharge status: Home / Self Care | Payer: Medicaid Other | Source: Ambulatory Visit | Attending: Interventional Radiology | Admitting: Interventional Radiology

## 2018-06-27 ENCOUNTER — Encounter (HOSPITAL_COMMUNITY): Payer: Self-pay | Admitting: Interventional Radiology

## 2018-06-27 DIAGNOSIS — N1339 Other hydronephrosis: Secondary | ICD-10-CM | POA: Insufficient documentation

## 2018-06-27 DIAGNOSIS — N1330 Unspecified hydronephrosis: Secondary | ICD-10-CM

## 2018-06-27 DIAGNOSIS — Z436 Encounter for attention to other artificial openings of urinary tract: Secondary | ICD-10-CM | POA: Diagnosis present

## 2018-06-27 HISTORY — PX: IR NEPHROSTOMY EXCHANGE LEFT: IMG6069

## 2018-06-27 MED ORDER — LIDOCAINE HCL 1 % IJ SOLN
INTRAMUSCULAR | Status: AC
  Filled 2018-06-27: qty 20

## 2018-06-27 MED ORDER — IOPAMIDOL (ISOVUE-300) INJECTION 61%
INTRAVENOUS | Status: AC
  Filled 2018-06-27: qty 50

## 2018-06-27 NOTE — Procedures (Signed)
Pre Procedure Dx: Hydronephrosis Post Procedure Dx: Same  Successful left sided 12 fr PCN exchange.    EBL: None   No immediate complications.   Ronny Bacon, MD Pager #: [phone]

## 2018-08-31 ENCOUNTER — Emergency Department (HOSPITAL_COMMUNITY)
Admission: EM | Admit: 2018-08-31 | Discharge: 2018-09-01 | Disposition: A | Discharge status: Skilled Nursing Facility | Payer: Medicaid Other | Attending: Emergency Medicine | Admitting: Emergency Medicine

## 2018-08-31 ENCOUNTER — Emergency Department (HOSPITAL_COMMUNITY): Payer: Medicaid Other

## 2018-08-31 ENCOUNTER — Encounter (HOSPITAL_COMMUNITY): Payer: Self-pay

## 2018-08-31 ENCOUNTER — Other Ambulatory Visit: Payer: Self-pay

## 2018-08-31 DIAGNOSIS — I129 Hypertensive chronic kidney disease with stage 1 through stage 4 chronic kidney disease, or unspecified chronic kidney disease: Secondary | ICD-10-CM | POA: Insufficient documentation

## 2018-08-31 DIAGNOSIS — Z79899 Other long term (current) drug therapy: Secondary | ICD-10-CM | POA: Diagnosis not present

## 2018-08-31 DIAGNOSIS — N3000 Acute cystitis without hematuria: Secondary | ICD-10-CM | POA: Diagnosis not present

## 2018-08-31 DIAGNOSIS — N184 Chronic kidney disease, stage 4 (severe): Secondary | ICD-10-CM | POA: Insufficient documentation

## 2018-08-31 DIAGNOSIS — E1122 Type 2 diabetes mellitus with diabetic chronic kidney disease: Secondary | ICD-10-CM | POA: Insufficient documentation

## 2018-08-31 DIAGNOSIS — I2510 Atherosclerotic heart disease of native coronary artery without angina pectoris: Secondary | ICD-10-CM | POA: Diagnosis not present

## 2018-08-31 DIAGNOSIS — N99528 Other complication of other external stoma of urinary tract: Secondary | ICD-10-CM

## 2018-08-31 LAB — URINALYSIS, ROUTINE W REFLEX MICROSCOPIC
Bilirubin Urine: NEGATIVE
GLUCOSE, UA: NEGATIVE mg/dL
Ketones, ur: NEGATIVE mg/dL
NITRITE: NEGATIVE
PH: 8 (ref 5.0–8.0)
Protein, ur: 100 mg/dL — AB
SPECIFIC GRAVITY, URINE: 1.01 (ref 1.005–1.030)
WBC, UA: >50 WBC/hpf — ABNORMAL HIGH (ref 0–5)

## 2018-08-31 LAB — BASIC METABOLIC PANEL
ANION GAP: 9 (ref 5–15)
BUN: 38 mg/dL — ABNORMAL HIGH (ref 6–20)
CHLORIDE: 109 mmol/L (ref 98–111)
CO2: 26 mmol/L (ref 22–32)
Calcium: 9.5 mg/dL (ref 8.9–10.3)
Creatinine, Ser: 2.67 mg/dL — ABNORMAL HIGH (ref 0.61–1.24)
GFR calc non Af Amer: 25 mL/min — ABNORMAL LOW (ref >60)
GFR, EST AFRICAN AMERICAN: 29 mL/min — AB (ref >60)
Glucose, Bld: 89 mg/dL (ref 70–99)
POTASSIUM: 5.2 mmol/L — AB (ref 3.5–5.1)
Sodium: 144 mmol/L (ref 135–145)

## 2018-08-31 LAB — CBC WITH DIFFERENTIAL/PLATELET
BASOS PCT: 0 %
Basophils Absolute: 0 10*3/uL (ref 0.0–0.1)
Eosinophils Absolute: 0.6 10*3/uL (ref 0.0–0.7)
Eosinophils Relative: 8 %
HCT: 44 % (ref 39.0–52.0)
Hemoglobin: 14 g/dL (ref 13.0–17.0)
LYMPHS ABS: 1.5 10*3/uL (ref 0.7–4.0)
LYMPHS PCT: 18 %
MCH: 25.7 pg — AB (ref 26.0–34.0)
MCHC: 31.8 g/dL (ref 30.0–36.0)
MCV: 80.9 fL (ref 78.0–100.0)
MONOS PCT: 9 %
Monocytes Absolute: 0.7 10*3/uL (ref 0.1–1.0)
NEUTROS PCT: 65 %
Neutro Abs: 5.1 10*3/uL (ref 1.7–7.7)
Platelets: 231 10*3/uL (ref 150–400)
RBC: 5.44 MIL/uL (ref 4.22–5.81)
RDW: 17.4 % — ABNORMAL HIGH (ref 11.5–15.5)
WBC: 8 10*3/uL (ref 4.0–10.5)

## 2018-08-31 MED ORDER — CEPHALEXIN 250 MG PO CAPS: 250.0000 mg | ORAL_CAPSULE | Freq: Two times a day (BID) | ORAL | 0 refills | Status: AC

## 2018-08-31 MED ORDER — SODIUM CHLORIDE 0.9 % IV SOLN
1.0000 g | Freq: Once | INTRAVENOUS | Status: AC
  Administered 2018-09-01: 1 g via INTRAVENOUS
  Filled 2018-08-31: qty 10

## 2018-08-31 NOTE — ED Provider Notes (Addendum)
Care assumed at shift change from [facility].  See her note for full HPI and work-up.  Briefly, patient with left Nephrostomy tube. Lives at [facility] and thinks he pulled his nephrostomy tube out when he rolled over.  No other complaints. Tube is in place, actively draining, sutures in place. U/A and basic labs. Plan: xray vs U/S to confirm placement of tube. KUB with reg contrast into tube if creatinine is within normal limits. If Cr elev, L sided renal U/S. If out, call [facility].  If imaging is normal, f/u w urology and abx. Physical Exam  BP (!) 127/93 (BP Location: Left Arm)   Pulse 97   Temp 98.6 F (37 C) (Oral)   Resp 18   Ht 6\' 1"  (1.854 m)   Wt 108.9 kg   SpO2 99%   BMI 31.66 kg/m   Physical Exam  Constitutional: He appears well-developed and well-nourished. No distress.  HENT:  Head: Normocephalic and atraumatic.  Eyes: Conjunctivae are normal.  Cardiovascular: Normal rate.  Pulmonary/Chest: Effort normal.  Psychiatric: He has a normal mood and affect. His behavior is normal.  Nursing note and vitals reviewed.   ED Course/Procedures     Procedures  MDM  Ultrasound showing nephrostomy tube in inferior pole.  Discussed results with Dr. Florina Ou.  Will discharge with plan to follow-up with his urologist.  Antibiotics provided for UTI.  Safe for discharge.      Robinson, Martinique N, PA-C 09/01/18 0134    Robinson, Martin[place] N, PA-C 09/01/18 5364    Varney Biles, MD 09/01/18 1510

## 2018-08-31 NOTE — ED Provider Notes (Signed)
[facility] DEPT Provider Note   CSN: [id number] Arrival date & time: 08/31/18  2034     History   Chief Complaint No chief complaint on file.   HPI Brendan [name] is a 57 y.o. male past medical history of CKD, nephrostomy tube, stroke who presents for evaluation of nephrostomy tube complication.  Patient brought in by EMS from [facility] for evaluation of his nephrostomy tube.  He states that he had been leaking around the site and that it came out.  They were able to put it back in.  He states he has been urinating from his penis which is abnormal for him.  He states that yesterday, the nephrostomy bag was not draining but it started draining today.  He still feels like it is not draining as fully as it needs to be.  No blood noted in the nephrostomy bag.  RN discussed with nursing home.  They report that he said yesterday, he turned over and the new tube came out.  He states that he needs to go to the hospital for further evaluation.  Nursing home did report that he had some increased generalized weakness and they were concerned for UTI.  No fevers noted, nausea/vomiting.   The history is provided by the patient.    Past Medical History:  Diagnosis Date  . Acute unilateral obstructive uropathy   . Chronic kidney disease, stage IV (severe) ([place])   . Constipation   . Coronary atherosclerosis of native coronary artery   . Diabetes mellitus without complication ([place])   . Enlarged prostate with urinary obstruction   . Essential hypertension, malignant   . GERD (gastroesophageal reflux disease)   . Glaucoma   . Hemiparesis affecting right side as late effect of cerebrovascular accident ([place])   . Hyperlipidemia   . Hypertension   . Hypokalemia   . Muscle weakness (generalized)   . Retroperitoneal fibrosis   . Stricture or kinking of ureter   . Stroke [facility])    right sided hemiparesis  . Vitamin D deficiency     Patient Active Problem List     Diagnosis Date Noted  . Symptomatic anemia 03/22/2018  . History of DVT in adulthood 03/22/2018  . Gross hematuria   . Nephrostomy tube displaced ([place]) 07/19/2017  . Pyelonephritis 02/16/2017  . 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 Coronary atherosclerosis of native coronary artery   . Essential hypertension   . Stroke ([place])   . Diabetes mellitus without complication ([place])   . Chronic kidney disease, stage IV (severe) ([place])   . GERD (gastroesophageal reflux disease)   . Glaucoma   . Hemiparesis affecting right side as late effect of cerebrovascular accident ([place])   . Enlarged prostate with urinary obstruction   . Constipation     Past Surgical History:  Procedure Laterality Date  . IR GENERIC HISTORICAL  11/09/2016   IR NEPHROSTOMY EXCHANGE LEFT 11/09/2016 Corrie Mckusick, DO [facility]-INTERV RAD  . IR GENERIC HISTORICAL  01/09/2017   IR NEPHROSTOMY EXCHANGE LEFT 01/09/2017 [name], MD [facility]-INTERV RAD  . IR NEPHROSTOMY EXCHANGE LEFT  03/06/2017  . IR NEPHROSTOMY EXCHANGE LEFT  04/13/2017  . IR NEPHROSTOMY EXCHANGE LEFT  06/08/2017  . IR NEPHROSTOMY EXCHANGE LEFT  07/20/2017  . IR NEPHROSTOMY EXCHANGE LEFT  08/03/2017  . IR NEPHROSTOMY EXCHANGE LEFT  09/28/2017  . IR NEPHROSTOMY EXCHANGE LEFT  11/29/2017  . IR NEPHROSTOMY EXCHANGE LEFT  01/31/2018  . IR NEPHROSTOMY EXCHANGE LEFT  04/04/2018  .  IR NEPHROSTOMY EXCHANGE LEFT  06/27/2018  . NEPHROSTOMY TUBE PLACEMENT ([facility])          Home Medications    Prior to Admission medications   Medication Sig Start Date End Date Taking? Authorizing Provider  acetaminophen (TYLENOL) 325 MG tablet Take 650 mg by mouth 3 (three) times daily as needed for mild pain or moderate pain.    [provider]  calcium carbonate (CALTRATE 600) 1500 (600 Ca) MG TABS tablet Take 1 tablet by mouth 2 (two) times daily.    [provider]  Cholecalciferol 1000 units tablet Take 1,000 Units by mouth daily.     [provider]  docusate  sodium (COLACE) 100 MG capsule Take 100 mg by mouth daily.    [provider]  furosemide (LASIX) 20 MG tablet Take 20 mg by mouth daily.    [provider]  mycophenolate (CELLCEPT) 500 MG tablet Take 500 mg by mouth 2 (two) times daily.    [provider]  Omega-3 Fatty Acids (FISH OIL) 1000 MG CAPS Take 1 capsule by mouth daily.    [provider]  senna (SENOKOT) 8.6 MG TABS tablet Take 1 tablet by mouth 2 (two) times daily.    [provider]  simvastatin (ZOCOR) 40 MG tablet Take 40 mg by mouth at bedtime.    [provider]  sodium bicarbonate 650 MG tablet Take 650 mg by mouth 3 (three) times daily.    [provider]  tamsulosin (FLOMAX) 0.4 MG CAPS capsule Take 0.4 mg by mouth at bedtime.    [provider]    Family History Family History  Family history unknown: Yes    Social History Social History   Tobacco Use  . Smoking status: Never Smoker  . Smokeless tobacco: Never Used  Substance Use Topics  . Alcohol use: Not Currently  . Drug use: Not Currently     Allergies   Other   Review of Systems Review of Systems  Constitutional: Negative for fever.  Respiratory: Negative for shortness of breath.   Cardiovascular: Negative for chest pain.  Gastrointestinal: Negative for abdominal pain, nausea and vomiting.  Genitourinary: Positive for frequency. Negative for dysuria and hematuria.       Nephrostomy tube issue  Neurological: Negative for headaches.  All other systems reviewed and are negative.    Physical Exam Updated Vital Signs BP (!) 127/93 (BP Location: Left Arm)   Pulse 97   Temp 98.6 F (37 C) (Oral)   Resp 18   Ht 6\' 1"  (1.854 m)   Wt 108.9 kg   SpO2 99%   BMI 31.66 kg/m   Physical Exam  Constitutional: He appears well-developed and well-nourished.  HENT:  Head: Normocephalic and atraumatic.  Mouth/Throat: Oropharynx is clear and moist and mucous membranes are normal.    Eyes: Pupils are equal, round, and reactive to light. Conjunctivae, EOM and lids are normal.  Neck: Full passive range of motion without pain.  Cardiovascular: Normal rate, regular rhythm, normal heart sounds and normal pulses. Exam reveals no gallop and no friction rub.  No murmur heard. Pulmonary/Chest: Effort normal and breath sounds normal.  Abdominal: Soft. Normal appearance. There is no tenderness. There is no rigidity and no guarding.    Abdomen is soft, non-distended, non-tender. No rigidity, No guarding. No peritoneal signs.  Genitourinary: Testes normal and penis normal. Right testis shows no swelling and no tenderness. Left testis shows no swelling and no tenderness.  Musculoskeletal:  Normal range of motion.  Neurological: He is alert.  Skin: Skin is warm and dry. Capillary refill takes less than 2 seconds.  Psychiatric: He has a normal mood and affect. His speech is normal.  Nursing note and vitals reviewed.    ED Treatments / Results  Labs (all labs ordered are listed, but only abnormal results are displayed) Labs Reviewed  URINALYSIS, ROUTINE W REFLEX MICROSCOPIC    EKG None  Radiology No results found.  Procedures Procedures (including critical care time)  Medications Ordered in ED Medications - No data to display   Initial Impression / Assessment and Plan / ED Course  I have reviewed the triage vital signs and the nursing notes.  Pertinent labs & imaging results that were available during my care of the patient were reviewed by me and considered in my medical decision making (see chart for details).     57 year old male past medical history of left-sided nephrostomy tube secondary to retroperitoneal fibrosis, obstructive uropathy.  Replaced on 06/27/18.  Came in today for concerns for nephrostomy tube complication.  Reports that he thinks he pulled it out.  Nursing home also reports some generalized weakness and concern for UTI.  Patient does report some  increased urinary frequency.  Reports he has been urinating more at his penis frequently for him.  No dysuria, hematuria.  No nausea/vomiting, fevers. Patient is afebrile, non-toxic appearing, sitting comfortably on examination table. Vital signs reviewed and stable.  Exam, nephrostomy tube appears to be in place.  No signs of dislodgment.  It is actively draining into the bag with no signs of hematuria.  Plan to check basic labs, UA.  UA shows moderate hemoglobin, large leukocytes, pyuria, many bacteria.  We will plan for urine culture.  Will give dose of antibiotic here in the ED.  Will likely need to go home on outpatient and buttocks.  CBC unremarkable.  Discussed with etiology.  They recommend injecting contrast into nephrostomy tube and getting a KUB if his creatinine is normal.  If creatinine is not acceptable for contrast, recommend getting ultrasound for evaluation of hydronephrosis in the left kidney.   BMP shows BUN of 30 and creatinine of 2.67.  This is baseline for him.  Given elevation in creatinine, will plan for ultrasound.  Patient signed out to Martinique Robinson, PA-C with ultrasound pending.  Anticipate discharge if tube is in place without any signs of hydronephrosis.  Urine culture sent.  We will plan to send patient home with anti-biotics.   Final Clinical Impressions(s) / ED Diagnoses   Final diagnoses:  Acute cystitis without hematuria  Nephrostomy complication [facility])    ED Discharge Orders    None        Volanda Napoleon, PA-C 09/01/18 2340    Varney Biles, MD 09/02/18 0002

## 2018-08-31 NOTE — ED Triage Notes (Signed)
Per EMS  Pt arriving from [facility], sent out to have a nephrostomy tube replaced. Vitals stable, A&O 4x, right sided weakness from previous stroke. Increased weakness unknown cause, suspected UTI from nursing home.

## 2018-08-31 NOTE — ED Notes (Signed)
Bed: WA07 Expected date:  Expected time:  Means of arrival:  Comments: Nephrostomy tube replacement

## 2018-08-31 NOTE — Discharge Instructions (Addendum)
Take antibiotics as directed. Please take all of your antibiotics until finished.  Follow-up with Urology as directed.   Return to emergency department for any abdominal pain, vomiting, fever, blood in urine, malfunction of nephrostomy tube or any other worsening or concerning symptoms.

## 2018-09-03 LAB — URINE CULTURE: SPECIAL REQUESTS: NORMAL

## 2018-09-04 ENCOUNTER — Telehealth: Payer: Self-pay | Admitting: *Deleted

## 2018-09-04 NOTE — Telephone Encounter (Signed)
Post ED Visit - Positive Culture Follow-up  Culture report reviewed by antimicrobial stewardship pharmacist:  []  Elenor Quinones, Pharm.D. []  Heide Guile, Pharm.D., BCPS AQ-ID []  Parks Neptune, Pharm.D., BCPS []  Alycia Rossetti, Pharm.D., BCPS []  [place], Pharm.D., BCPS, AAHIVP []  Legrand Como, Pharm.D., BCPS, AAHIVP []  Salome Arnt, PharmD, BCPS []  Johnnette Gourd, PharmD, BCPS []  Hughes Better, PharmD, BCPS []  Leeroy Cha, PharmD  Positive urine culture, reviewed by Arlean Hopping, PA-C Treated with Cephalexin.  Plan is D/C Cephalexin, no further treatment.  Results faxed to [facility], fax [phone], Att: Dr. Brunilda Payor, Danny Lawless 09/04/2018, 10:42 AM

## 2018-09-04 NOTE — Progress Notes (Signed)
ED Antimicrobial Stewardship Positive Culture Follow Up   Brendan [name] is an 57 y.o. male who presented to [facility] on 08/31/2018 with a chief complaint of generalized weakness and possible nephrostomy tube complication. 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 WBC wnl and patient afebrile in the ED.   Recent Results (from the past 720 hour(s))  Urine culture     Status: Abnormal   Collection Time: 09/01/18 12:33 AM  Result Value Ref Range Status   Specimen Description   Final    URINE, RANDOM Performed at [facility] [address] [place], [place] [address]    Special Requests   Final    Normal Performed at [facility], [place] [address] [place], [place] [address]    Culture (A)  Final    >=100,000 COLONIES/mL ESCHERICHIA COLI Confirmed Extended Spectrum Beta-Lactamase Producer (ESBL).  In bloodstream infections from ESBL organisms, carbapenems are preferred over piperacillin/tazobactam. They are shown to have a lower risk of mortality.    Report Status 09/03/2018 FINAL  Final   Organism ID, Bacteria ESCHERICHIA COLI (A)  Final      Susceptibility   Escherichia coli - MIC*    AMPICILLIN >=32 RESISTANT Resistant     CEFAZOLIN >=64 RESISTANT Resistant     CEFTRIAXONE >=64 RESISTANT Resistant     CIPROFLOXACIN >=4 RESISTANT Resistant     GENTAMICIN <=1 SENSITIVE Sensitive     IMIPENEM <=0.25 SENSITIVE Sensitive     NITROFURANTOIN <=16 SENSITIVE Sensitive     TRIMETH/SULFA >=320 RESISTANT Resistant     AMPICILLIN/SULBACTAM >=32 RESISTANT Resistant     PIP/TAZO 8 SENSITIVE Sensitive     Extended ESBL POSITIVE Resistant     * >=100,000 COLONIES/mL ESCHERICHIA COLI   Patient has a urine culture from 03/2018 that shows same organism with identical sensitivities and MIC breakpoints.  [x]  Treated with cephalexin 250 mg BID x 7 days, organism resistant to prescribed  antimicrobial  Recommend to discontinue cephalexin given that this culture likely resembles colonization and patient did not present with any infectious urinary symptoms.  ED Provider: Arlean Hopping, PA-C  [name] 09/04/2018, 9:45 AM Clinical Pharmacist Monday - Friday phone -  [phone] Saturday - Sunday phone - [phone]

## 2018-09-19 ENCOUNTER — Ambulatory Visit (HOSPITAL_COMMUNITY)
Admission: RE | Admit: 2018-09-19 | Discharge: 2018-09-19 | Disposition: A | Discharge status: Home / Self Care | Payer: Medicaid Other | Source: Ambulatory Visit | Attending: Interventional Radiology | Admitting: Interventional Radiology

## 2018-09-19 ENCOUNTER — Encounter (HOSPITAL_COMMUNITY): Payer: Self-pay | Admitting: Diagnostic Radiology

## 2018-09-19 ENCOUNTER — Other Ambulatory Visit (HOSPITAL_COMMUNITY): Payer: Self-pay | Admitting: Interventional Radiology

## 2018-09-19 DIAGNOSIS — Z436 Encounter for attention to other artificial openings of urinary tract: Secondary | ICD-10-CM | POA: Diagnosis not present

## 2018-09-19 DIAGNOSIS — N1330 Unspecified hydronephrosis: Secondary | ICD-10-CM

## 2018-09-19 HISTORY — PX: IR NEPHROSTOMY EXCHANGE LEFT: IMG6069

## 2018-09-19 MED ORDER — IOPAMIDOL (ISOVUE-300) INJECTION 61%
50.0000 mL | Freq: Once | INTRAVENOUS | Status: AC | PRN
  Administered 2018-09-19: 15 mL

## 2018-09-19 MED ORDER — IOPAMIDOL (ISOVUE-300) INJECTION 61%
INTRAVENOUS | Status: AC
  Administered 2018-09-19: 15 mL
  Filled 2018-09-19: qty 50

## 2018-09-19 MED ORDER — LIDOCAINE HCL 1 % IJ SOLN
INTRAMUSCULAR | Status: AC
  Filled 2018-09-19: qty 20

## 2018-09-19 NOTE — Procedures (Signed)
Routine exchange of left nephrostomy tube.  Tube in renal pelvis.  No blood loss and no immediate complication.

## 2018-11-05 ENCOUNTER — Ambulatory Visit (INDEPENDENT_AMBULATORY_CARE_PROVIDER_SITE_OTHER): Payer: Medicaid Other | Admitting: Urology

## 2018-11-05 DIAGNOSIS — N1330 Unspecified hydronephrosis: Secondary | ICD-10-CM

## 2018-12-10 ENCOUNTER — Ambulatory Visit (HOSPITAL_COMMUNITY)
Admission: RE | Admit: 2018-12-10 | Discharge: 2018-12-10 | Disposition: A | Discharge status: Home / Self Care | Payer: Medicaid Other | Source: Ambulatory Visit | Attending: Interventional Radiology | Admitting: Interventional Radiology

## 2018-12-10 ENCOUNTER — Other Ambulatory Visit (HOSPITAL_COMMUNITY): Payer: Self-pay | Admitting: Interventional Radiology

## 2018-12-10 ENCOUNTER — Encounter (HOSPITAL_COMMUNITY): Payer: Self-pay | Admitting: Interventional Radiology

## 2018-12-10 DIAGNOSIS — N1330 Unspecified hydronephrosis: Secondary | ICD-10-CM | POA: Diagnosis not present

## 2018-12-10 DIAGNOSIS — Z436 Encounter for attention to other artificial openings of urinary tract: Secondary | ICD-10-CM | POA: Insufficient documentation

## 2018-12-10 HISTORY — PX: IR NEPHROSTOMY EXCHANGE LEFT: IMG6069

## 2018-12-10 MED ORDER — IOPAMIDOL (ISOVUE-300) INJECTION 61%
50.0000 mL | Freq: Once | INTRAVENOUS | Status: AC | PRN
  Administered 2018-12-10: 10 mL

## 2018-12-10 MED ORDER — IOPAMIDOL (ISOVUE-300) INJECTION 61%
INTRAVENOUS | Status: AC
  Administered 2018-12-10: 10 mL
  Filled 2018-12-10: qty 50

## 2018-12-10 MED ORDER — LIDOCAINE HCL 1 % IJ SOLN
INTRAMUSCULAR | Status: AC
  Filled 2018-12-10: qty 20

## 2019-02-25 ENCOUNTER — Other Ambulatory Visit (HOSPITAL_COMMUNITY): Payer: Medicaid Other

## 2019-02-25 IMAGING — CR DG CHEST 1V PORT
1 series · 2 of 2 positions shown · non-contrast
Comparison: Portable chest x-ray August 03, 2017

CLINICAL DATA: Status post left-sided PICC line placement.

EXAM:
PORTABLE CHEST 1 VIEW

[Series 1: portable · 0.17mm/px · 2 of 2 slices shown]
[im 1/2]
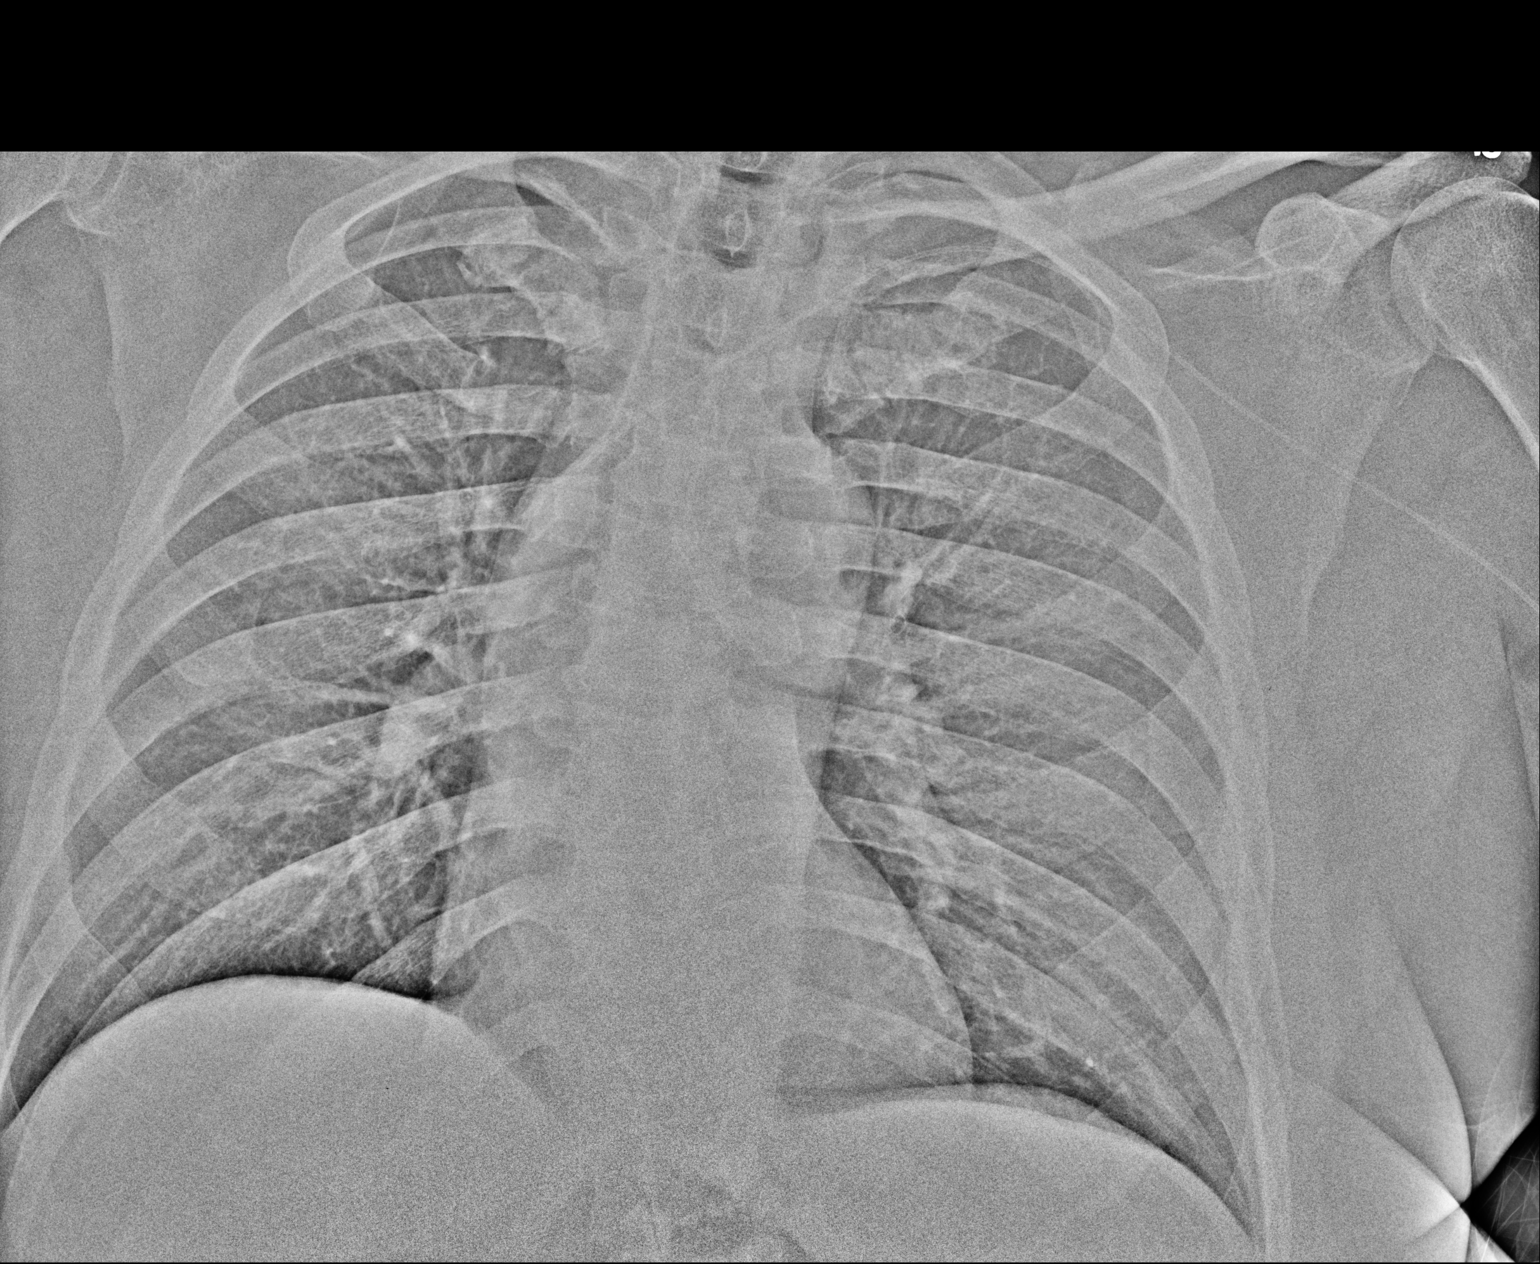
[im 2/2]
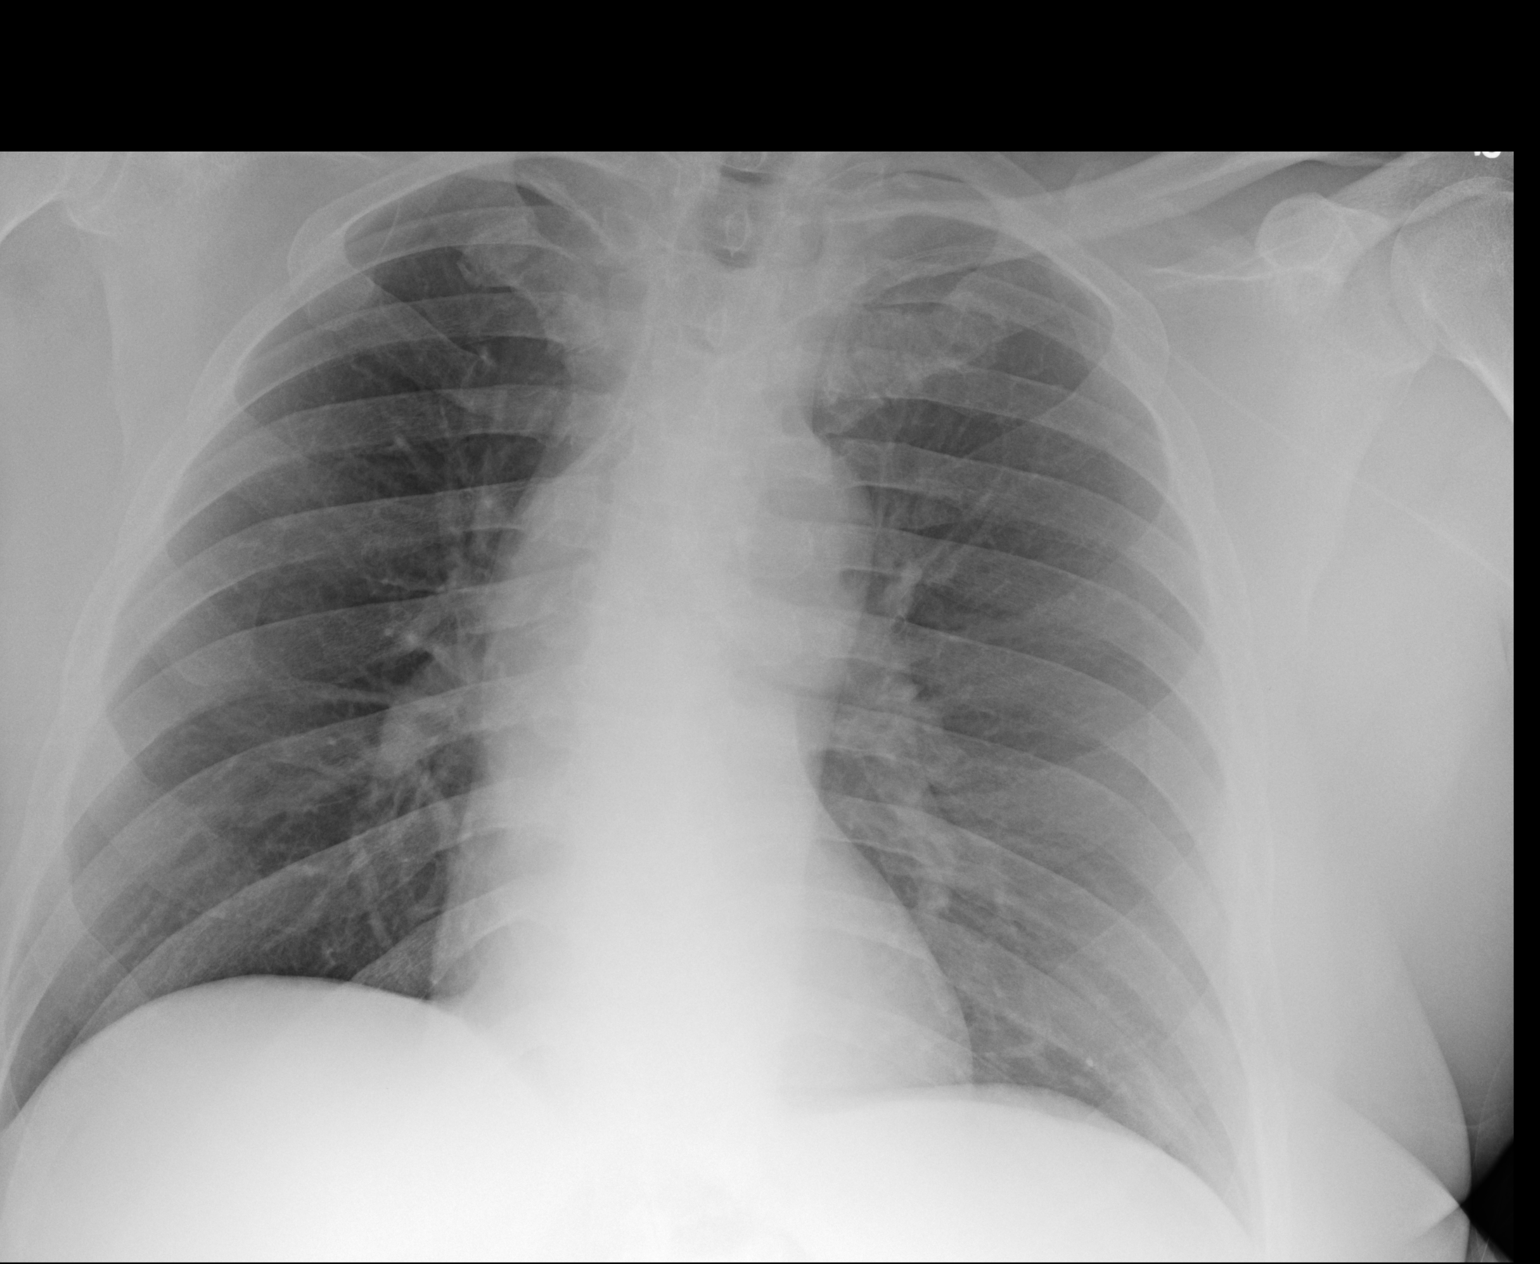

[2 of 2 positions shown; findings below may reference images not displayed]

FINDINGS: The left sided PICC line tip projects over the proximal third of the
SVC. There is no postprocedure complication. The lungs are
well-expanded and clear. The heart and pulmonary vascularity are
normal.
IMPRESSION: There is no immediate postprocedure complication following PICC line
placement via the left upper extremity.

## 2019-03-06 IMAGING — XA IR EXCHANGE NEPHROSTOMY LEFT
1 series · 2 of 2 positions shown · non-contrast
Comparison: none

CLINICAL DATA: History of retroperitoneal fibrosis, long-term
indwelling left nephrostomy catheter 9 WK ROUTINE EXCHANGE scheduled

EXAM:
LEFT PERCUTANEOUS NEPHROSTOMY CATHETER EXCHANGE UNDER FLUOROSCOPY
FLUOROSCOPY TIME:  0.8 minute; 166 uXym0 DAP seconds
TECHNIQUE: The procedure, risks (including but not limited to bleeding,
infection, organ damage ), benefits, and alternatives were explained
to the patient. Questions regarding the procedure were encouraged
and answered. The patient understands and consents to the procedure.

[Series 300: ir nephrostomy exchange left · 2 of 2 slices shown]
[im 1/2]
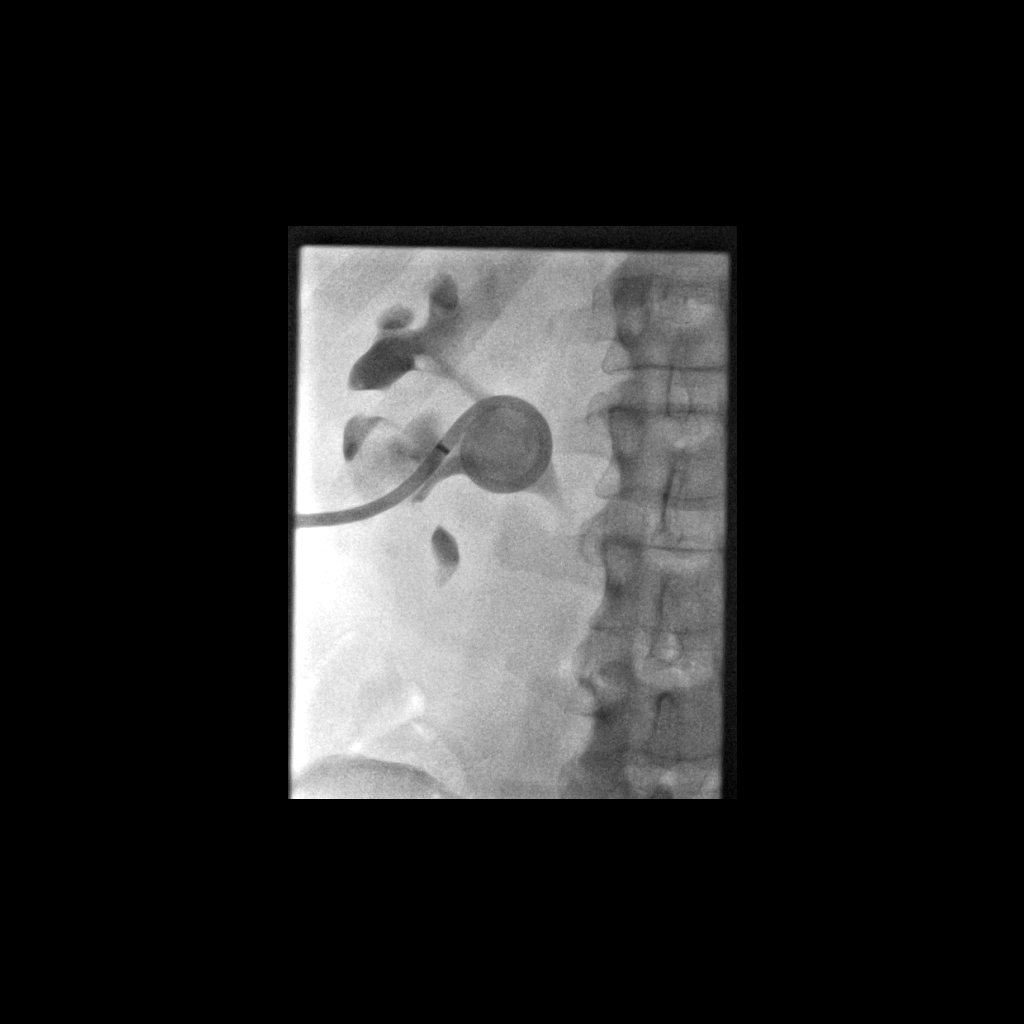
[im 2/2]
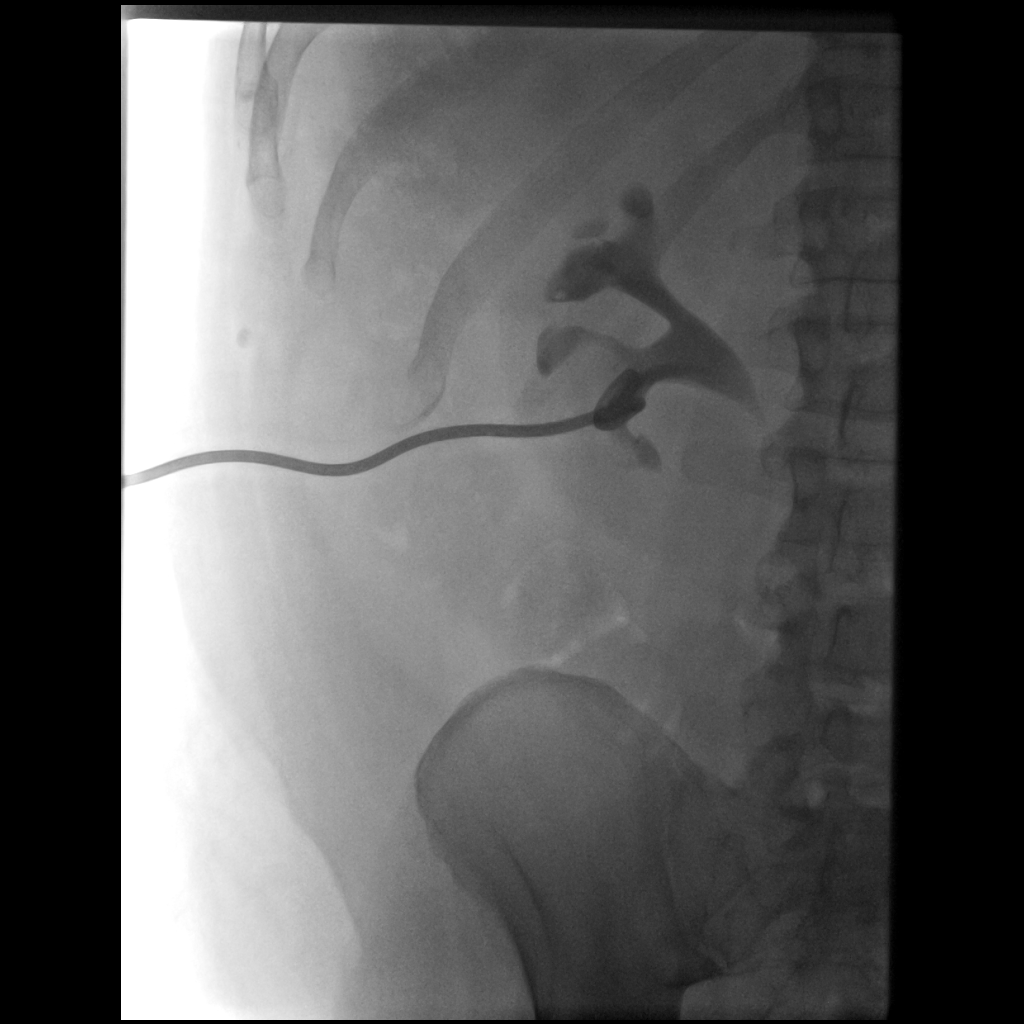

[2 of 2 positions shown; findings below may reference images not displayed]

The nephrostomy tube and surrounding skin were prepped with
Betadine, draped in usual sterile fashion.

A small amount of contrast was injected through the left nephrostomy
catheter to opacify the renal collecting system. The catheter was
cut and exchanged over a 0.035" angiographic wire for a new
12-French pigtail catheter, formed centrally within the collecting
system under fluoroscopy. There were few deposits on the removed
catheter. Contrast injection confirms appropriate positioning.

Catheter secured externally with 0 Prolene suture and StatLock and
placed to external gravity drainage. The patient tolerated the
procedure well.

COMPLICATIONS:
None
IMPRESSION: 1. Technically successful exchange and up sizing of left nephrostomy
catheter under fluoroscopy to a 12 French device. His exchange
interval can be extended for 2 additional weeks.

## 2019-03-11 ENCOUNTER — Other Ambulatory Visit (HOSPITAL_COMMUNITY): Payer: Medicaid Other

## 2019-05-06 ENCOUNTER — Other Ambulatory Visit (HOSPITAL_COMMUNITY): Payer: Medicaid Other

## 2019-06-10 ENCOUNTER — Other Ambulatory Visit (HOSPITAL_COMMUNITY): Payer: Medicaid Other

## 2019-06-26 ENCOUNTER — Other Ambulatory Visit (HOSPITAL_COMMUNITY): Payer: Self-pay | Admitting: Interventional Radiology

## 2019-06-26 ENCOUNTER — Ambulatory Visit (HOSPITAL_COMMUNITY)
Admission: RE | Admit: 2019-06-26 | Discharge: 2019-06-26 | Disposition: A | Discharge status: Home / Self Care | Payer: Medicaid Other | Source: Ambulatory Visit | Attending: Interventional Radiology | Admitting: Interventional Radiology

## 2019-06-26 ENCOUNTER — Other Ambulatory Visit: Payer: Self-pay

## 2019-06-26 ENCOUNTER — Encounter (HOSPITAL_COMMUNITY): Payer: Self-pay | Admitting: Interventional Radiology

## 2019-06-26 DIAGNOSIS — N1330 Unspecified hydronephrosis: Secondary | ICD-10-CM

## 2019-06-26 DIAGNOSIS — Z436 Encounter for attention to other artificial openings of urinary tract: Secondary | ICD-10-CM | POA: Insufficient documentation

## 2019-06-26 HISTORY — PX: IR NEPHROSTOMY EXCHANGE LEFT: IMG6069

## 2019-06-26 MED ORDER — IOHEXOL 300 MG/ML  SOLN
50.0000 mL | Freq: Once | INTRAMUSCULAR | Status: AC | PRN
  Administered 2019-06-26: 10 mL

## 2019-08-05 ENCOUNTER — Other Ambulatory Visit (HOSPITAL_COMMUNITY): Payer: Medicaid Other

## 2019-10-16 ENCOUNTER — Encounter (HOSPITAL_COMMUNITY): Payer: Self-pay | Admitting: Interventional Radiology

## 2019-10-16 ENCOUNTER — Other Ambulatory Visit (HOSPITAL_COMMUNITY): Payer: Self-pay | Admitting: Interventional Radiology

## 2019-10-16 ENCOUNTER — Ambulatory Visit (HOSPITAL_COMMUNITY)
Admission: RE | Admit: 2019-10-16 | Discharge: 2019-10-16 | Disposition: A | Discharge status: Home / Self Care | Payer: Medicaid Other | Source: Ambulatory Visit | Attending: Interventional Radiology | Admitting: Interventional Radiology

## 2019-10-16 ENCOUNTER — Other Ambulatory Visit: Payer: Self-pay

## 2019-10-16 DIAGNOSIS — Z436 Encounter for attention to other artificial openings of urinary tract: Secondary | ICD-10-CM | POA: Insufficient documentation

## 2019-10-16 DIAGNOSIS — N1330 Unspecified hydronephrosis: Secondary | ICD-10-CM

## 2019-10-16 HISTORY — PX: IR NEPHROSTOMY EXCHANGE LEFT: IMG6069

## 2019-10-16 MED ORDER — IOHEXOL 300 MG/ML  SOLN
50.0000 mL | Freq: Once | INTRAMUSCULAR | Status: AC | PRN
  Administered 2019-10-16: 10 mL

## 2019-10-16 NOTE — Procedures (Signed)
Pre Procedure Dx: Hydronephrosis Post Procedure Dx: Same  Successful left sided PCN exchange.    EBL: None   No immediate complications.   Jay Hayla Hinger, MD Pager #: [phone]   

## 2019-11-11 ENCOUNTER — Other Ambulatory Visit (HOSPITAL_COMMUNITY): Payer: Self-pay | Admitting: Radiology

## 2019-11-11 DIAGNOSIS — N1330 Unspecified hydronephrosis: Secondary | ICD-10-CM

## 2019-11-12 ENCOUNTER — Encounter (HOSPITAL_COMMUNITY): Payer: Self-pay | Admitting: Diagnostic Radiology

## 2019-11-12 ENCOUNTER — Other Ambulatory Visit: Payer: Self-pay

## 2019-11-12 ENCOUNTER — Ambulatory Visit (HOSPITAL_COMMUNITY)
Admission: RE | Admit: 2019-11-12 | Discharge: 2019-11-12 | Disposition: A | Discharge status: Home / Self Care | Payer: Medicaid Other | Source: Ambulatory Visit | Attending: Radiology | Admitting: Radiology

## 2019-11-12 DIAGNOSIS — Z436 Encounter for attention to other artificial openings of urinary tract: Secondary | ICD-10-CM | POA: Insufficient documentation

## 2019-11-12 DIAGNOSIS — N1330 Unspecified hydronephrosis: Secondary | ICD-10-CM

## 2019-11-12 HISTORY — PX: IR NEPHROSTOGRAM LEFT THRU EXISTING ACCESS: IMG6061

## 2019-11-12 MED ORDER — LIDOCAINE HCL 1 % IJ SOLN
INTRAMUSCULAR | Status: AC
  Filled 2019-11-12: qty 20

## 2019-11-12 MED ORDER — IOHEXOL 300 MG/ML  SOLN
50.0000 mL | Freq: Once | INTRAMUSCULAR | Status: AC | PRN
  Administered 2019-11-12: 16 mL

## 2019-11-12 NOTE — Procedures (Signed)
Interventional Radiology Procedure:   Indications: Chronic left nephrostomy with blood in urine  Procedure: Left nephrostomy tube exchange  Findings: Drain was coiled up near calyx.  New 10 Fr drain placed and positioned in renal pelvis.    Complications: none     EBL: less than 10 ml  Plan: Plan for routine exchange.    Ceaser Ebeling R. Anselm Pancoast, MD  Pager: [phone]

## 2019-12-18 ENCOUNTER — Encounter (HOSPITAL_COMMUNITY): Payer: Self-pay

## 2019-12-18 ENCOUNTER — Emergency Department (HOSPITAL_COMMUNITY): Payer: Medicaid Other

## 2019-12-18 ENCOUNTER — Inpatient Hospital Stay (HOSPITAL_COMMUNITY)
Admission: EM | Admit: 2019-12-18 | Discharge: 2019-12-22 | DRG: 300 | Disposition: A | Discharge status: Skilled Nursing Facility | Payer: Medicaid Other | Source: Skilled Nursing Facility | Attending: Internal Medicine | Admitting: Internal Medicine

## 2019-12-18 DIAGNOSIS — R310 Gross hematuria: Secondary | ICD-10-CM | POA: Diagnosis present

## 2019-12-18 DIAGNOSIS — N131 Hydronephrosis with ureteral stricture, not elsewhere classified: Secondary | ICD-10-CM | POA: Diagnosis present

## 2019-12-18 DIAGNOSIS — I824Y1 Acute embolism and thrombosis of unspecified deep veins of right proximal lower extremity: Principal | ICD-10-CM | POA: Diagnosis present

## 2019-12-18 DIAGNOSIS — Z936 Other artificial openings of urinary tract status: Secondary | ICD-10-CM

## 2019-12-18 DIAGNOSIS — Z86718 Personal history of other venous thrombosis and embolism: Secondary | ICD-10-CM

## 2019-12-18 DIAGNOSIS — N135 Crossing vessel and stricture of ureter without hydronephrosis: Secondary | ICD-10-CM

## 2019-12-18 DIAGNOSIS — Z20822 Contact with and (suspected) exposure to covid-19: Secondary | ICD-10-CM | POA: Diagnosis present

## 2019-12-18 DIAGNOSIS — I129 Hypertensive chronic kidney disease with stage 1 through stage 4 chronic kidney disease, or unspecified chronic kidney disease: Secondary | ICD-10-CM | POA: Diagnosis present

## 2019-12-18 DIAGNOSIS — H409 Unspecified glaucoma: Secondary | ICD-10-CM | POA: Diagnosis present

## 2019-12-18 DIAGNOSIS — I82401 Acute embolism and thrombosis of unspecified deep veins of right lower extremity: Secondary | ICD-10-CM | POA: Diagnosis present

## 2019-12-18 DIAGNOSIS — I69351 Hemiplegia and hemiparesis following cerebral infarction affecting right dominant side: Secondary | ICD-10-CM

## 2019-12-18 DIAGNOSIS — E785 Hyperlipidemia, unspecified: Secondary | ICD-10-CM | POA: Diagnosis present

## 2019-12-18 DIAGNOSIS — R319 Hematuria, unspecified: Secondary | ICD-10-CM

## 2019-12-18 DIAGNOSIS — I10 Essential (primary) hypertension: Secondary | ICD-10-CM | POA: Diagnosis present

## 2019-12-18 DIAGNOSIS — N184 Chronic kidney disease, stage 4 (severe): Secondary | ICD-10-CM | POA: Diagnosis not present

## 2019-12-18 DIAGNOSIS — I2510 Atherosclerotic heart disease of native coronary artery without angina pectoris: Secondary | ICD-10-CM | POA: Diagnosis present

## 2019-12-18 DIAGNOSIS — I82409 Acute embolism and thrombosis of unspecified deep veins of unspecified lower extremity: Secondary | ICD-10-CM | POA: Diagnosis present

## 2019-12-18 DIAGNOSIS — N400 Enlarged prostate without lower urinary tract symptoms: Secondary | ICD-10-CM | POA: Diagnosis present

## 2019-12-18 DIAGNOSIS — Z79899 Other long term (current) drug therapy: Secondary | ICD-10-CM

## 2019-12-18 DIAGNOSIS — K219 Gastro-esophageal reflux disease without esophagitis: Secondary | ICD-10-CM | POA: Diagnosis present

## 2019-12-18 DIAGNOSIS — Z7982 Long term (current) use of aspirin: Secondary | ICD-10-CM

## 2019-12-18 HISTORY — DX: Allergic rhinitis, unspecified: J30.9

## 2019-12-18 HISTORY — DX: Acidosis: E87.2

## 2019-12-18 HISTORY — DX: Unspecified hydronephrosis: N13.30

## 2019-12-18 HISTORY — DX: Acidosis, unspecified: E87.20

## 2019-12-18 HISTORY — DX: Hemiplegia, unspecified affecting unspecified side: G81.90

## 2019-12-18 HISTORY — DX: Peripheral vascular disease, unspecified: I73.9

## 2019-12-18 LAB — COMPREHENSIVE METABOLIC PANEL
ALT: 35 U/L (ref 0–44)
AST: 29 U/L (ref 15–41)
Albumin: 3.8 g/dL (ref 3.5–5.0)
Alkaline Phosphatase: 86 U/L (ref 38–126)
Anion gap: 9 (ref 5–15)
BUN: 37 mg/dL — ABNORMAL HIGH (ref 6–20)
CO2: 25 mmol/L (ref 22–32)
Calcium: 8.9 mg/dL (ref 8.9–10.3)
Chloride: 104 mmol/L (ref 98–111)
Creatinine, Ser: 2.48 mg/dL — ABNORMAL HIGH (ref 0.61–1.24)
GFR calc Af Amer: 31 mL/min — ABNORMAL LOW (ref >60)
GFR calc non Af Amer: 27 mL/min — ABNORMAL LOW (ref >60)
Glucose, Bld: 87 mg/dL (ref 70–99)
Potassium: 4.4 mmol/L (ref 3.5–5.1)
Sodium: 138 mmol/L (ref 135–145)
Total Bilirubin: 0.5 mg/dL (ref 0.3–1.2)
Total Protein: 8.4 g/dL — ABNORMAL HIGH (ref 6.5–8.1)

## 2019-12-18 LAB — TYPE AND SCREEN
ABO/RH(D): A POS
Antibody Screen: NEGATIVE

## 2019-12-18 LAB — CBC WITH DIFFERENTIAL/PLATELET
Abs Immature Granulocytes: 0.02 10*3/uL (ref 0.00–0.07)
Basophils Absolute: 0 10*3/uL (ref 0.0–0.1)
Basophils Relative: 0 %
Eosinophils Absolute: 0.5 10*3/uL (ref 0.0–0.5)
Eosinophils Relative: 7 %
HCT: 46.2 % (ref 39.0–52.0)
Hemoglobin: 14.2 g/dL (ref 13.0–17.0)
Immature Granulocytes: 0 %
Lymphocytes Relative: 15 %
Lymphs Abs: 1 10*3/uL (ref 0.7–4.0)
MCH: 27.5 pg (ref 26.0–34.0)
MCHC: 30.7 g/dL (ref 30.0–36.0)
MCV: 89.4 fL (ref 80.0–100.0)
Monocytes Absolute: 0.6 10*3/uL (ref 0.1–1.0)
Monocytes Relative: 8 %
Neutro Abs: 4.7 10*3/uL (ref 1.7–7.7)
Neutrophils Relative %: 70 %
Platelets: 180 10*3/uL (ref 150–400)
RBC: 5.17 MIL/uL (ref 4.22–5.81)
RDW: 15.9 % — ABNORMAL HIGH (ref 11.5–15.5)
WBC: 6.8 10*3/uL (ref 4.0–10.5)
nRBC: 0 % (ref 0.0–0.2)

## 2019-12-18 LAB — RESPIRATORY PANEL BY RT PCR (FLU A&B, COVID)
Influenza A by PCR: NEGATIVE
Influenza B by PCR: NEGATIVE
SARS Coronavirus 2 by RT PCR: NEGATIVE

## 2019-12-18 LAB — PROTIME-INR
INR: 1.1 (ref 0.8–1.2)
Prothrombin Time: 13.8 seconds (ref 11.4–15.2)

## 2019-12-18 LAB — APTT: aPTT: 33 seconds (ref 24–36)

## 2019-12-18 MED ORDER — HEPARIN BOLUS VIA INFUSION
4000.0000 [IU] | Freq: Once | INTRAVENOUS | Status: AC
  Administered 2019-12-18: 4000 [IU] via INTRAVENOUS

## 2019-12-18 MED ORDER — HEPARIN (PORCINE) 25000 UT/250ML-% IV SOLN
1600.0000 [IU]/h | INTRAVENOUS | Status: DC
  Administered 2019-12-18: 19:00:00 1600 [IU]/h via INTRAVENOUS
  Filled 2019-12-18: qty 250

## 2019-12-18 NOTE — H&P (Signed)
History and Physical    Brendan [name] [id number] DOB: 22-Aug-1961 DOA: 12/18/2019  PCP: Cleda Mccreedy, MD   Patient coming from: SNF   Chief Complaint: Right leg DVT  HPI: Brendan [name] is a 59 y.o. 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  Patient reports that his Xarelto had been stopped due to gross hematuria, hematuria cleared, but he developed progressive swelling in the right leg with recent ultrasound demonstrating RLE DVT.  Patient denies leg pain, denies cough, denies shortness of breath, denies chest pain, and denies fevers or chills.  ED Course: Upon arrival to the ED, patient is found to be afebrile, saturating well on room air, and with stable blood pressure.  Chemistry panel is notable for creatinine of 2.48, similar to priors.  CBC is unremarkable.  Chest x-ray is negative for acute findings.  ED physician discussed the case with hematology who recommended admitting the patient, starting IV heparin infusion, transitioning to oral anticoagulant if the patient does not hemorrhage, and transfer to [facility] for consideration of IVC filter if he has significant bleeding on heparin.  IV heparin was started, COVID-19 screening test ordered, and hospitalist consulted for admission.  Review of Systems:  All other systems reviewed and apart from HPI, are negative.  Past Medical History:  Diagnosis Date  . Acidosis   . Acute unilateral obstructive uropathy   . Allergic rhinitis   . Chronic kidney disease, stage IV (severe) ([place])   . Constipation   . Coronary atherosclerosis of native coronary artery   . Diabetes mellitus without complication ([place])   . Enlarged prostate with urinary  obstruction   . Essential hypertension, malignant   . GERD (gastroesophageal reflux disease)   . Glaucoma   . Hemiparesis affecting right side as late effect of cerebrovascular accident ([place])   . Hemiplegia ([place])   . Hydronephrosis   . Hyperlipidemia   . Hypertension   . Hypokalemia   . Muscle weakness (generalized)   . PVD (peripheral vascular disease) ([place])   . Retroperitoneal fibrosis   . Stricture or kinking of ureter   . Stroke [facility])    right sided hemiparesis  . Vitamin D deficiency     Past Surgical History:  Procedure Laterality Date  . IR GENERIC HISTORICAL  11/09/2016   IR NEPHROSTOMY EXCHANGE LEFT 11/09/2016 Corrie Mckusick, DO [facility]-INTERV RAD  . IR GENERIC HISTORICAL  01/09/2017   IR NEPHROSTOMY EXCHANGE LEFT 01/09/2017 [name], MD [facility]-INTERV RAD  . IR NEPHROSTOGRAM LEFT THRU EXISTING ACCESS  11/12/2019  . IR NEPHROSTOMY EXCHANGE LEFT  03/06/2017  . IR NEPHROSTOMY EXCHANGE LEFT  04/13/2017  . IR NEPHROSTOMY EXCHANGE LEFT  06/08/2017  . IR NEPHROSTOMY EXCHANGE LEFT  07/20/2017  . IR NEPHROSTOMY EXCHANGE LEFT  08/03/2017  . IR NEPHROSTOMY EXCHANGE LEFT  09/28/2017  . IR NEPHROSTOMY EXCHANGE LEFT  11/29/2017  . IR NEPHROSTOMY EXCHANGE LEFT  01/31/2018  . IR NEPHROSTOMY EXCHANGE LEFT  04/04/2018  . IR NEPHROSTOMY EXCHANGE LEFT  06/27/2018  . IR NEPHROSTOMY EXCHANGE LEFT  09/19/2018  . IR NEPHROSTOMY EXCHANGE LEFT  12/10/2018  . IR NEPHROSTOMY EXCHANGE LEFT  06/26/2019  . IR NEPHROSTOMY EXCHANGE LEFT  10/16/2019  . NEPHROSTOMY TUBE PLACEMENT ([place] [facility])       reports that  he has never smoked. He has never used smokeless tobacco. He reports previous alcohol use. He reports previous drug use.  Allergies  Allergen Reactions  . Other     TB serum-reaction unknown. Provided via MAR records     Family History  Family history unknown: Yes     Prior to Admission medications   Medication Sig Start Date End Date Taking? Authorizing Provider  acetaminophen (TYLENOL) 325 MG tablet  Take 650 mg by mouth every 8 (eight) hours as needed for mild pain or moderate pain.    Yes [provider]  aspirin EC 81 MG tablet Take 81 mg by mouth daily.   Yes [provider]  calcium carbonate (CALTRATE 600) 1500 (600 Ca) MG TABS tablet Take 1 tablet by mouth 2 (two) times daily.   Yes [provider]  cetaphil (CETAPHIL) lotion Apply 1 application topically at bedtime. Applied to feet/ankles   Yes [provider]  Cholecalciferol 1000 units tablet Take 2,000 Units by mouth daily.    Yes [provider]  ezetimibe (ZETIA) 10 MG tablet Take 10 mg by mouth daily.   Yes [provider]  furosemide (LASIX) 20 MG tablet Take 20 mg by mouth daily.   Yes [provider]  guaiFENesin (MUCINEX) 600 MG 12 hr tablet Take 600 mg by mouth 2 (two) times daily.   Yes [provider]  loratadine (CLARITIN) 10 MG tablet Take 10 mg by mouth daily.   Yes [provider]  mycophenolate (CELLCEPT) 500 MG tablet Take 500 mg by mouth 2 (two) times daily.   Yes [provider]  senna (SENOKOT) 8.6 MG TABS tablet Take 1 tablet by mouth daily.    Yes [provider]  simvastatin (ZOCOR) 40 MG tablet Take 40 mg by mouth at bedtime.   Yes [provider]  sodium bicarbonate 650 MG tablet Take 650 mg by mouth 3 (three) times daily.   Yes [provider]  tamsulosin (FLOMAX) 0.4 MG CAPS capsule Take 0.4 mg by mouth at bedtime.   Yes [provider]  zinc sulfate 220 (50 Zn) MG capsule Take 220 mg by mouth daily.   Yes [provider]    Physical Exam: Vitals:   12/18/19 1633 12/18/19 1635  BP: (!) 130/91   Pulse: 81   Resp: 18   Temp: 98.8 F (37.1 C)   TempSrc: Oral   SpO2: 100%   Weight:  113.4 kg  Height:  6\' 1"  (1.854 m)    Constitutional: NAD, calm  Eyes: PERTLA, lids and conjunctivae normal ENMT: Mucous membranes are moist. Posterior pharynx clear of any exudate or  lesions.   Neck: normal, supple, no masses, no thyromegaly Respiratory: no wheezing, no crackles. Normal respiratory effort. No accessory muscle use.  Cardiovascular: S1 & S2 heard, regular rate and rhythm. RLE swelling. Abdomen: No distension, no tenderness, soft. Bowel sounds active.  Musculoskeletal: no clubbing / cyanosis. No joint deformity upper and lower extremities.    Skin: no significant rashes, lesions, ulcers. Warm, dry, well-perfused. Neurologic: no gross facial asymmetry. No dysarthria. Right sided weakness.   Psychiatric: Alert, oriented, and appropriate throughout interview and exam. Very pleasant, cooperative.    Labs on Admission: I have personally reviewed following labs and imaging studies  CBC: Recent Labs  Lab 12/18/19 1715  WBC 6.8  NEUTROABS 4.7  HGB 14.2  HCT 46.2  MCV 89.4  PLT 99991111   Basic Metabolic Panel: Recent Labs  Lab 12/18/19  1715  NA 138  K 4.4  CL 104  CO2 25  GLUCOSE 87  BUN 37*  CREATININE 2.48*  CALCIUM 8.9   GFR: Estimated Creatinine Clearance: 41.8 mL/min (A) (by C-G formula based on SCr of 2.48 mg/dL (H)). Liver Function Tests: Recent Labs  Lab 12/18/19 1715  AST 29  ALT 35  ALKPHOS 86  BILITOT 0.5  PROT 8.4*  ALBUMIN 3.8   No results for input(s): LIPASE, AMYLASE in the last 168 hours. No results for input(s): AMMONIA in the last 168 hours. Coagulation Profile: Recent Labs  Lab 12/18/19 1715  INR 1.1   Cardiac Enzymes: No results for input(s): CKTOTAL, CKMB, CKMBINDEX, TROPONINI in the last 168 hours. BNP (last 3 results) No results for input(s): PROBNP in the last 8760 hours. HbA1C: No results for input(s): HGBA1C in the last 72 hours. CBG: No results for input(s): GLUCAP in the last 168 hours. Lipid Profile: No results for input(s): CHOL, HDL, LDLCALC, TRIG, CHOLHDL, LDLDIRECT in the last 72 hours. Thyroid Function Tests: No results for input(s): TSH, T4TOTAL, FREET4, T3FREE, THYROIDAB in the last 72  hours. Anemia Panel: No results for input(s): VITAMINB12, FOLATE, FERRITIN, TIBC, IRON, RETICCTPCT in the last 72 hours. Urine analysis:    Component Value Date/Time   COLORURINE YELLOW 08/31/2018 2132   APPEARANCEUR CLOUDY (A) 08/31/2018 2132   LABSPEC 1.010 08/31/2018 2132   PHURINE 8.0 08/31/2018 2132   GLUCOSEU NEGATIVE 08/31/2018 2132   HGBUR MODERATE (A) 08/31/2018 2132   BILIRUBINUR NEGATIVE 08/31/2018 2132   [place] NEGATIVE 08/31/2018 2132   PROTEINUR 100 (A) 08/31/2018 2132   NITRITE NEGATIVE 08/31/2018 2132   LEUKOCYTESUR LARGE (A) 08/31/2018 2132   Sepsis Labs: @LABRCNTIP (procalcitonin:4,lacticidven:4) )No results found for this or any previous visit (from the past 240 hour(s)).   Radiological Exams on Admission: DG Chest Port 1 View  Result Date: 12/18/2019 CLINICAL DATA:  DVT EXAM: PORTABLE CHEST 1 VIEW COMPARISON:  03/26/2018 FINDINGS: Heart and mediastinal contours are within normal limits. No focal opacities or effusions. No acute bony abnormality. IMPRESSION: Negative. Electronically Signed   By: Rolm Baptise M.D.   On: 12/18/2019 17:43    Assessment/Plan   1. Right leg DVT  - Presents from SNF with RLE DVT and progressive swelling after anticoagulant was stopped due to gross hematuria  - The leg is soft and no evidence for phlegmasia; no evidence for PE; no gross hematuria on admission  - Hematology recommended observing in the hospital on IV heparin, transitioning back to an oral anticoagulant if he does not have significant bleeding, and transfer to [facility] for consideration of IVC filter if there is significant bleeding on heparin  - IV heparin has been started in ED, will monitor for bleeding    2. Retroperitoneal fibrosis  - Continue Cellcept    3. CKD stage IIIb  - SCr is 2.48 on admission, similar to priors  - Renally-dose medications, continue bicarbonate and diuretic    4. History of CVA  - Continue statin, hold ASA initially while initiating IV  heparin and monitoring for bleeding     DVT prophylaxis: IV heparin  Code Status: Full  Family Communication: Discussed with patient  Consults called: None  Admission status: Observation     Vianne Bulls, MD [facility] Pager [phone]  If 7PM-7AM, please contact night-coverage [email or web address] Password [facility]  12/18/2019, 8:14 PM

## 2019-12-18 NOTE — ED Triage Notes (Signed)
Ems reports pt resident of [facility].  Staff reports pt has had swollen r leg and has been diagnosed with DVT.

## 2019-12-18 NOTE — ED Provider Notes (Signed)
[facility] Provider Note   CSN: [id number] Arrival date & time: 12/18/19  1625     History No chief complaint on file.   Brendan [name] is a 59 y.o. male.  HPI   This patient is a 59 year old male, the patient has a known history of stage IV chronic kidney disease, he has a history of a prior ischemic stroke that left him with right-sided hemiparesis and in a nursing facility at [facility].  He has hypertension, hyperlipidemia, history of hydronephrosis and unfortunately a history of deep venous thrombosis for which she takes Xarelto.  The patient reports that he has had about 5 or 6 weeks of progressive swelling of his right lower extremity.  This is significant and worsening over the last week prompting an ultrasound to be ordered.  It came back today showing an occlusive thrombus of the right lower extremity.  The patient was sent to the hospital for further evaluation.  He states his symptoms are mild but his leg is very swollen.  He states he has no chest pain or shortness of breath or difficulty breathing.  He has had no fevers.  The symptoms are gradually worsening.  No medications given prior to arrival other than his prior Xarelto and it is questionable how much of that he had.  On further questioning the patient reports that he was having some bleeding from his nephrostomy tubes secondary to his obstructive nephropathy and when he started taking the Xarelto it seemed to get worse.  The Xarelto was stopped because of increased bleeding and the bleeding seemed to clear up.  See medical decision making below for further narrative  Past Medical History:  Diagnosis Date  . Acidosis   . Acute unilateral obstructive uropathy   . Allergic rhinitis   . Chronic kidney disease, stage IV (severe) ([place])   . Constipation   . Coronary atherosclerosis of native coronary artery   . Diabetes mellitus without complication ([place])   . Enlarged prostate with urinary obstruction    . Essential hypertension, malignant   . GERD (gastroesophageal reflux disease)   . Glaucoma   . Hemiparesis affecting right side as late effect of cerebrovascular accident ([place])   . Hemiplegia ([place])   . Hydronephrosis   . Hyperlipidemia   . Hypertension   . Hypokalemia   . Muscle weakness (generalized)   . PVD (peripheral vascular disease) ([place])   . Retroperitoneal fibrosis   . Stricture or kinking of ureter   . Stroke [facility])    right sided hemiparesis  . Vitamin D deficiency     Patient Active Problem List   Diagnosis Date Noted  . Symptomatic anemia 03/22/2018  . History of DVT in adulthood 03/22/2018  . Gross hematuria   . Nephrostomy tube displaced ([place]) 07/19/2017  . Pyelonephritis 02/16/2017  . 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 Coronary atherosclerosis of native coronary artery   . Essential hypertension   . Stroke ([place])   . Diabetes mellitus without complication ([place])   . Chronic kidney disease, stage IV (severe) ([place])   . GERD (gastroesophageal reflux disease)   . Glaucoma   . Hemiparesis affecting right side as late effect of cerebrovascular accident ([place])   . Enlarged prostate with urinary obstruction   . Constipation     Past Surgical History:  Procedure Laterality Date  . IR GENERIC HISTORICAL  11/09/2016   IR NEPHROSTOMY EXCHANGE LEFT 11/09/2016 Corrie Mckusick, DO [facility]-INTERV RAD  . IR GENERIC HISTORICAL  01/09/2017  IR NEPHROSTOMY EXCHANGE LEFT 01/09/2017 [name], MD [facility]-INTERV RAD  . IR NEPHROSTOGRAM LEFT THRU EXISTING ACCESS  11/12/2019  . IR NEPHROSTOMY EXCHANGE LEFT  03/06/2017  . IR NEPHROSTOMY EXCHANGE LEFT  04/13/2017  . IR NEPHROSTOMY EXCHANGE LEFT  06/08/2017  . IR NEPHROSTOMY EXCHANGE LEFT  07/20/2017  . IR NEPHROSTOMY EXCHANGE LEFT  08/03/2017  . IR NEPHROSTOMY EXCHANGE LEFT  09/28/2017  . IR NEPHROSTOMY EXCHANGE LEFT  11/29/2017  . IR NEPHROSTOMY EXCHANGE LEFT  01/31/2018  . IR NEPHROSTOMY EXCHANGE LEFT  04/04/2018  . IR NEPHROSTOMY EXCHANGE  LEFT  06/27/2018  . IR NEPHROSTOMY EXCHANGE LEFT  09/19/2018  . IR NEPHROSTOMY EXCHANGE LEFT  12/10/2018  . IR NEPHROSTOMY EXCHANGE LEFT  06/26/2019  . IR NEPHROSTOMY EXCHANGE LEFT  10/16/2019  . NEPHROSTOMY TUBE PLACEMENT ([place] [facility]X)         Family History  Family history unknown: Yes    Social History   Tobacco Use  . Smoking status: Never Smoker  . Smokeless tobacco: Never Used  Substance Use Topics  . Alcohol use: Not Currently  . Drug use: Not Currently    Home Medications Prior to Admission medications   Medication Sig Start Date End Date Taking? Authorizing Provider  acetaminophen (TYLENOL) 325 MG tablet Take 650 mg by mouth 3 (three) times daily as needed for mild pain or moderate pain.    [provider]  calcium carbonate (CALTRATE 600) 1500 (600 Ca) MG TABS tablet Take 1 tablet by mouth 2 (two) times daily.    [provider]  Cholecalciferol 1000 units tablet Take 1,000 Units by mouth daily.     [provider]  docusate sodium (COLACE) 100 MG capsule Take 100 mg by mouth daily.    [provider]  furosemide (LASIX) 20 MG tablet Take 20 mg by mouth daily.    [provider]  mycophenolate (CELLCEPT) 500 MG tablet Take 500 mg by mouth 2 (two) times daily.    [provider]  Omega-3 Fatty Acids (FISH OIL) 1000 MG CAPS Take 1 capsule by mouth daily.    [provider]  senna (SENOKOT) 8.6 MG TABS tablet Take 1 tablet by mouth 2 (two) times daily.    [provider]  simvastatin (ZOCOR) 40 MG tablet Take 40 mg by mouth at bedtime.    [provider]  sodium bicarbonate 650 MG tablet Take 650 mg by mouth 3 (three) times daily.    [provider]  tamsulosin (FLOMAX) 0.4 MG CAPS capsule Take 0.4 mg by mouth at bedtime.    [provider]    Allergies    Other  Review of Systems   Review of Systems  All other systems reviewed and are negative.   Physical Exam Updated  Vital Signs There were no vitals taken for this visit.  Physical Exam Vitals and nursing note reviewed.  Constitutional:      General: He is not in acute distress.    Appearance: He is well-developed. He is not ill-appearing.  HENT:     Head: Normocephalic and atraumatic.     Mouth/Throat:     Pharynx: No oropharyngeal exudate.  Eyes:     General: No scleral icterus.       Right eye: No discharge.        Left eye: No discharge.     Conjunctiva/sclera: Conjunctivae normal.     Pupils: Pupils are equal, round, and reactive to light.  Neck:  Thyroid: No thyromegaly.     Vascular: No JVD.  Cardiovascular:     Rate and Rhythm: Normal rate and regular rhythm.     Heart sounds: Normal heart sounds. No murmur. No friction rub. No gallop.   Pulmonary:     Effort: Pulmonary effort is normal. No respiratory distress.     Breath sounds: Normal breath sounds. No wheezing or rales.  Abdominal:     General: Bowel sounds are normal. There is no distension.     Palpations: Abdomen is soft. There is no mass.     Tenderness: There is no abdominal tenderness.  Musculoskeletal:        General: No tenderness. Normal range of motion.     Cervical back: Normal range of motion and neck supple.     Right lower leg: Edema present.     Comments: The patient has edema from his foot through his proximal thigh on the right side.  It is significantly swollen compared to the left.  Lymphadenopathy:     Cervical: No cervical adenopathy.  Skin:    General: Skin is warm and dry.     Findings: No erythema or rash.  Neurological:     Mental Status: He is alert.     Coordination: Coordination normal.     Comments: The patient has normal speech, he has significant right-sided weakness of both the arm and the leg though he is able to barely move the leg.  Psychiatric:        Behavior: Behavior normal.     ED Results / Procedures / Treatments   Labs (all labs ordered are listed, but only abnormal  results are displayed) Labs Reviewed  RESPIRATORY PANEL BY RT PCR (FLU A&B, COVID)  CBC WITH DIFFERENTIAL/PLATELET  COMPREHENSIVE METABOLIC PANEL  PROTIME-INR  APTT    EKG None  Radiology No results found.  Procedures Procedures (including critical care time)  Medications Ordered in ED Medications - No data to display  ED Course  I have reviewed the triage vital signs and the nursing notes.  Pertinent labs & imaging results that were available during my care of the patient were reviewed by me and considered in my medical decision making (see chart for details).  Clinical Course as of Dec 18 1855  Brendan [name]  [id number] The labs show the patient is not anemic, his creatinine is 2.5, this is baseline   [BM]    Clinical Course User Index [BM] Brendan Chapel, MD   MDM Rules/Calculators/A&P                      This patient has an occlusive thrombus of the right lower extremity.  He will need to have evaluation for advancing his anticoagulants.  Will discuss with oncology, will also try to obtain ultrasound report, get baseline labs.  The patient does not appear to have any chest pain or shortness of breath however the extent of his DVT is concerning.  I have reviewed the medical record, the ultrasound report accompanying the patient states that it was performed on December 12, 2019, there was the presence of clot in the right common femoral vein and nothing in the left lower extremity.  The right popliteal vein and posterior tibial vein were normal.  The ultrasound was performed on January 8, the medication administration record reflects that the patient has not been started on Xarelto, it seemed to have been started on the eighth and discontinued on the  ninth but there is no record of the patient being given that medication  Further history obtained from the nursing facility Per the nurse, the patient did have an episode of penile bleeding after starting Xarelto, the medication  was discontinued approximately 5 days ago, a vascular surgery consult was requested but has not yet occurred and because of the ongoing swelling of the leg he was sent to the hospital to obtain a more rapid evaluation for possible filter or clot removal.  Discussed the care with the oncologist Dr. Delton Coombes, he recommends that the patient be admitted to the hospital for anticoagulation with heparin.  If he does not bleed on heparin we can switch him to an oral anticoagulant.  If he does not tolerate this and continues to bleed he would need to be transferred for vascular or interventional radiology consultation for clot removal or IVC filter.  That the patient started to bleed within 1 or 2 doses of starting on Xarelto makes it questionable as to this being the cause of the bleeding  I discussed the case with Dr. Myna Hidalgo who will coordinate admission - Heparin drip has been started.   Brendan [name] was evaluated in Emergency Department on 12/18/2019 for the symptoms described in the history of present illness. He was evaluated in the context of the global COVID-19 pandemic, which necessitated consideration that the patient might be at risk for infection with the SARS-CoV-2 virus that causes COVID-19. Institutional protocols and algorithms that pertain to the evaluation of patients at risk for COVID-19 are in a state of rapid change based on information released by regulatory bodies including the CDC and federal and state organizations. These policies and algorithms were followed during the patient's care in the ED.   Final Clinical Impression(s) / ED Diagnoses Final diagnoses:  Acute deep vein thrombosis (DVT) of proximal vein of right lower extremity (HCC)      Brendan Chapel, MD 12/18/19 1919

## 2019-12-18 NOTE — ED Notes (Signed)
Hospitalist at bedside 

## 2019-12-18 NOTE — Progress Notes (Signed)
ANTICOAGULATION CONSULT NOTE - Initial Consult  Pharmacy Consult for heparin gtt  Indication: DVT  Allergies  Allergen Reactions  . Other     TB serum-reaction unknown. Provided via [facility] records     Patient Measurements: Height: 6\' 1"  (185.4 cm) Weight: 250 lb (113.4 kg) IBW/kg (Calculated) : 79.9 Heparin Dosing Weight: HEPARIN DW (KG): 103.9   Vital Signs: Temp: 98.8 F (37.1 C) (01/14 1633) Temp Source: Oral (01/14 1633) BP: 130/91 (01/14 1633) Pulse Rate: 81 (01/14 1633)  Labs: Recent Labs    12/18/19 1715  HGB 14.2  HCT 46.2  PLT 180  APTT 33  LABPROT 13.8  INR 1.1  CREATININE 2.48*    Estimated Creatinine Clearance: 41.8 mL/min (A) (by C-G formula based on SCr of 2.48 mg/dL (H)).   Medical History: Past Medical History:  Diagnosis Date  . Acidosis   . Acute unilateral obstructive uropathy   . Allergic rhinitis   . Chronic kidney disease, stage IV (severe) ([place])   . Constipation   . Coronary atherosclerosis of native coronary artery   . Diabetes mellitus without complication ([place])   . Enlarged prostate with urinary obstruction   . Essential hypertension, malignant   . GERD (gastroesophageal reflux disease)   . Glaucoma   . Hemiparesis affecting right side as late effect of cerebrovascular accident ([place])   . Hemiplegia ([place])   . Hydronephrosis   . Hyperlipidemia   . Hypertension   . Hypokalemia   . Muscle weakness (generalized)   . PVD (peripheral vascular disease) ([place])   . Retroperitoneal fibrosis   . Stricture or kinking of ureter   . Stroke [facility])    right sided hemiparesis  . Vitamin D deficiency     Medications:  (Not in a hospital admission)  Scheduled:  . heparin  4,000 Units Intravenous Once   Infusions:  . heparin     PRN:  Anti-infectives (From admission, onward)   None      Assessment: Brendan [name] a 59 y.o. male requires anticoagulation with a heparin iv infusion for the indication of  DVT. Heparin gtt will be started  following pharmacy protocol per pharmacy consult. Patient is not on previous oral anticoagulant that will require aPTT/HL correlation before transitioning to only HL monitoring.   Goal of Therapy:  Heparin level 0.3-0.7 units/ml Monitor platelets by anticoagulation protocol: Yes   Plan:  Give 4000 units bolus x 1 Start heparin infusion at 1600 units/hr Check anti-Xa level in 6 hours and daily while on heparin Continue to monitor H&H and platelets  Heparin level to be drawn in 6 hours. 8 hours for patients >72 years old or crcl < 19ml/min  Dessiree Sze 12/18/2019,7:03 PM

## 2019-12-19 ENCOUNTER — Encounter (HOSPITAL_COMMUNITY): Payer: Self-pay | Admitting: Family Medicine

## 2019-12-19 ENCOUNTER — Other Ambulatory Visit: Payer: Self-pay

## 2019-12-19 DIAGNOSIS — K219 Gastro-esophageal reflux disease without esophagitis: Secondary | ICD-10-CM | POA: Diagnosis present

## 2019-12-19 DIAGNOSIS — I129 Hypertensive chronic kidney disease with stage 1 through stage 4 chronic kidney disease, or unspecified chronic kidney disease: Secondary | ICD-10-CM | POA: Diagnosis present

## 2019-12-19 DIAGNOSIS — Z936 Other artificial openings of urinary tract status: Secondary | ICD-10-CM | POA: Diagnosis not present

## 2019-12-19 DIAGNOSIS — Z20822 Contact with and (suspected) exposure to covid-19: Secondary | ICD-10-CM | POA: Diagnosis present

## 2019-12-19 DIAGNOSIS — E785 Hyperlipidemia, unspecified: Secondary | ICD-10-CM | POA: Diagnosis present

## 2019-12-19 DIAGNOSIS — N184 Chronic kidney disease, stage 4 (severe): Secondary | ICD-10-CM | POA: Diagnosis present

## 2019-12-19 DIAGNOSIS — I69351 Hemiplegia and hemiparesis following cerebral infarction affecting right dominant side: Secondary | ICD-10-CM | POA: Diagnosis not present

## 2019-12-19 DIAGNOSIS — Z7982 Long term (current) use of aspirin: Secondary | ICD-10-CM | POA: Diagnosis not present

## 2019-12-19 DIAGNOSIS — Z86718 Personal history of other venous thrombosis and embolism: Secondary | ICD-10-CM | POA: Diagnosis not present

## 2019-12-19 DIAGNOSIS — H409 Unspecified glaucoma: Secondary | ICD-10-CM | POA: Diagnosis present

## 2019-12-19 DIAGNOSIS — N131 Hydronephrosis with ureteral stricture, not elsewhere classified: Secondary | ICD-10-CM | POA: Diagnosis present

## 2019-12-19 DIAGNOSIS — Z79899 Other long term (current) drug therapy: Secondary | ICD-10-CM | POA: Diagnosis not present

## 2019-12-19 DIAGNOSIS — I824Y1 Acute embolism and thrombosis of unspecified deep veins of right proximal lower extremity: Principal | ICD-10-CM

## 2019-12-19 DIAGNOSIS — N400 Enlarged prostate without lower urinary tract symptoms: Secondary | ICD-10-CM | POA: Diagnosis present

## 2019-12-19 DIAGNOSIS — I82409 Acute embolism and thrombosis of unspecified deep veins of unspecified lower extremity: Secondary | ICD-10-CM | POA: Diagnosis not present

## 2019-12-19 DIAGNOSIS — I2510 Atherosclerotic heart disease of native coronary artery without angina pectoris: Secondary | ICD-10-CM | POA: Diagnosis present

## 2019-12-19 LAB — BASIC METABOLIC PANEL
Anion gap: 9 (ref 5–15)
BUN: 36 mg/dL — ABNORMAL HIGH (ref 6–20)
CO2: 21 mmol/L — ABNORMAL LOW (ref 22–32)
Calcium: 8.5 mg/dL — ABNORMAL LOW (ref 8.9–10.3)
Chloride: 108 mmol/L (ref 98–111)
Creatinine, Ser: 2.42 mg/dL — ABNORMAL HIGH (ref 0.61–1.24)
GFR calc Af Amer: 32 mL/min — ABNORMAL LOW (ref >60)
GFR calc non Af Amer: 28 mL/min — ABNORMAL LOW (ref >60)
Glucose, Bld: 84 mg/dL (ref 70–99)
Potassium: 4.7 mmol/L (ref 3.5–5.1)
Sodium: 138 mmol/L (ref 135–145)

## 2019-12-19 LAB — CBC
HCT: 43.7 % (ref 39.0–52.0)
Hemoglobin: 13.5 g/dL (ref 13.0–17.0)
MCH: 27.1 pg (ref 26.0–34.0)
MCHC: 30.9 g/dL (ref 30.0–36.0)
MCV: 87.8 fL (ref 80.0–100.0)
Platelets: 189 10*3/uL (ref 150–400)
RBC: 4.98 MIL/uL (ref 4.22–5.81)
RDW: 15.7 % — ABNORMAL HIGH (ref 11.5–15.5)
WBC: 6.4 10*3/uL (ref 4.0–10.5)
nRBC: 0 % (ref 0.0–0.2)

## 2019-12-19 LAB — HEPARIN LEVEL (UNFRACTIONATED)
Heparin Unfractionated: 1.13 IU/mL — ABNORMAL HIGH (ref 0.30–0.70)
Heparin Unfractionated: 1 IU/mL — ABNORMAL HIGH (ref 0.30–0.70)
Heparin Unfractionated: 0.45 IU/mL (ref 0.30–0.70)

## 2019-12-19 LAB — MRSA PCR SCREENING: MRSA by PCR: POSITIVE — AB

## 2019-12-19 LAB — HIV ANTIBODY (ROUTINE TESTING W REFLEX): HIV Screen 4th Generation wRfx: NONREACTIVE

## 2019-12-19 MED ORDER — SODIUM CHLORIDE 0.9 % IV SOLN: 250.0000 mL | INTRAVENOUS | Status: DC | PRN

## 2019-12-19 MED ORDER — SENNA 8.6 MG PO TABS
1.0000 | ORAL_TABLET | Freq: Every day | ORAL | Status: DC
  Administered 2019-12-19 – 2019-12-22 (×4): 8.6 mg via ORAL
  Filled 2019-12-19 (×4): qty 1

## 2019-12-19 MED ORDER — ONDANSETRON HCL 4 MG PO TABS: 4.0000 mg | ORAL_TABLET | Freq: Four times a day (QID) | ORAL | Status: DC | PRN

## 2019-12-19 MED ORDER — HEPARIN (PORCINE) 25000 UT/250ML-% IV SOLN
1000.0000 [IU]/h | INTRAVENOUS | Status: DC
  Administered 2019-12-19: 10:00:00 1300 [IU]/h via INTRAVENOUS
  Filled 2019-12-19: qty 250

## 2019-12-19 MED ORDER — CALCIUM CARBONATE 1250 (500 CA) MG PO TABS
1.0000 | ORAL_TABLET | Freq: Two times a day (BID) | ORAL | Status: DC
  Administered 2019-12-19 – 2019-12-22 (×7): 500 mg via ORAL
  Filled 2019-12-19 (×7): qty 1

## 2019-12-19 MED ORDER — SODIUM CHLORIDE 0.9% FLUSH: 3.0000 mL | INTRAVENOUS | Status: DC | PRN

## 2019-12-19 MED ORDER — SODIUM CHLORIDE 0.9% FLUSH
3.0000 mL | Freq: Two times a day (BID) | INTRAVENOUS | Status: DC
  Administered 2019-12-19 – 2019-12-21 (×3): 3 mL via INTRAVENOUS

## 2019-12-19 MED ORDER — MUPIROCIN 2 % EX OINT
1.0000 "application " | TOPICAL_OINTMENT | Freq: Two times a day (BID) | CUTANEOUS | Status: DC
  Administered 2019-12-19 – 2019-12-22 (×6): 1 via NASAL
  Filled 2019-12-19: qty 22

## 2019-12-19 MED ORDER — ACETAMINOPHEN 325 MG PO TABS
650.0000 mg | ORAL_TABLET | Freq: Four times a day (QID) | ORAL | Status: DC | PRN
  Administered 2019-12-21: 05:00:00 650 mg via ORAL
  Filled 2019-12-19: qty 2

## 2019-12-19 MED ORDER — GUAIFENESIN ER 600 MG PO TB12
600.0000 mg | ORAL_TABLET | Freq: Two times a day (BID) | ORAL | Status: DC
  Administered 2019-12-19 – 2019-12-22 (×7): 600 mg via ORAL
  Filled 2019-12-19 (×7): qty 1

## 2019-12-19 MED ORDER — SODIUM CHLORIDE 0.9% FLUSH
3.0000 mL | Freq: Two times a day (BID) | INTRAVENOUS | Status: DC
  Administered 2019-12-19 – 2019-12-22 (×5): 3 mL via INTRAVENOUS

## 2019-12-19 MED ORDER — ONDANSETRON HCL 4 MG/2ML IJ SOLN: 4.0000 mg | Freq: Four times a day (QID) | INTRAMUSCULAR | Status: DC | PRN

## 2019-12-19 MED ORDER — TAMSULOSIN HCL 0.4 MG PO CAPS
0.4000 mg | ORAL_CAPSULE | Freq: Every day | ORAL | Status: DC
  Administered 2019-12-19 – 2019-12-21 (×3): 0.4 mg via ORAL
  Filled 2019-12-19 (×3): qty 1

## 2019-12-19 MED ORDER — FUROSEMIDE 20 MG PO TABS
20.0000 mg | ORAL_TABLET | Freq: Every day | ORAL | Status: DC
  Administered 2019-12-19 – 2019-12-22 (×4): 20 mg via ORAL
  Filled 2019-12-19 (×4): qty 1

## 2019-12-19 MED ORDER — SIMVASTATIN 20 MG PO TABS
40.0000 mg | ORAL_TABLET | Freq: Every day | ORAL | Status: DC
  Administered 2019-12-19 – 2019-12-21 (×3): 40 mg via ORAL
  Filled 2019-12-19 (×3): qty 2

## 2019-12-19 MED ORDER — ACETAMINOPHEN 650 MG RE SUPP: 650.0000 mg | Freq: Four times a day (QID) | RECTAL | Status: DC | PRN

## 2019-12-19 MED ORDER — SODIUM BICARBONATE 650 MG PO TABS
650.0000 mg | ORAL_TABLET | Freq: Three times a day (TID) | ORAL | Status: DC
  Administered 2019-12-19 – 2019-12-22 (×10): 650 mg via ORAL
  Filled 2019-12-19 (×10): qty 1

## 2019-12-19 MED ORDER — MYCOPHENOLATE MOFETIL 250 MG PO CAPS
500.0000 mg | ORAL_CAPSULE | Freq: Two times a day (BID) | ORAL | Status: DC
  Administered 2019-12-19 – 2019-12-22 (×7): 500 mg via ORAL
  Filled 2019-12-19 (×7): qty 2

## 2019-12-19 MED ORDER — CHLORHEXIDINE GLUCONATE CLOTH 2 % EX PADS
6.0000 | MEDICATED_PAD | Freq: Every day | CUTANEOUS | Status: DC
  Administered 2019-12-20 – 2019-12-21 (×2): 6 via TOPICAL

## 2019-12-19 NOTE — Progress Notes (Signed)
[facility] for Heparin  Indication: DVT  Allergies  Allergen Reactions  . Other     TB serum-reaction unknown. Provided via [facility] records     Patient Measurements: Height: 6\' 1"  (185.4 cm) Weight: 248 lb 14.4 oz (112.9 kg) IBW/kg (Calculated) : 79.9 Heparin Dosing Weight: HEPARIN DW (KG): 103.9   Vital Signs: Temp: 98.7 F (37.1 C) (01/15 0016) Temp Source: Oral (01/15 0016) BP: 115/90 (01/15 0016) Pulse Rate: 85 (01/15 0016)  Labs: Recent Labs    12/18/19 1715 12/19/19 0050  HGB 14.2  --   HCT 46.2  --   PLT 180  --   APTT 33  --   LABPROT 13.8  --   INR 1.1  --   HEPARINUNFRC  --  1.13*  CREATININE 2.48*  --     Estimated Creatinine Clearance: 41.7 mL/min (A) (by C-G formula based on SCr of 2.48 mg/dL (H)).   Medical History: Past Medical History:  Diagnosis Date  . Acidosis   . Acute unilateral obstructive uropathy   . Allergic rhinitis   . Chronic kidney disease, stage IV (severe) ([place])   . Constipation   . Coronary atherosclerosis of native coronary artery   . Diabetes mellitus without complication ([place])   . Enlarged prostate with urinary obstruction   . Essential hypertension, malignant   . GERD (gastroesophageal reflux disease)   . Glaucoma   . Hemiparesis affecting right side as late effect of cerebrovascular accident ([place])   . Hemiplegia ([place])   . Hydronephrosis   . Hyperlipidemia   . Hypertension   . Hypokalemia   . Muscle weakness (generalized)   . PVD (peripheral vascular disease) ([place])   . Retroperitoneal fibrosis   . Stricture or kinking of ureter   . Stroke [facility])    right sided hemiparesis  . Vitamin D deficiency     Medications:  Medications Prior to Admission  Medication Sig Dispense Refill Last Dose  . acetaminophen (TYLENOL) 325 MG tablet Take 650 mg by mouth every 8 (eight) hours as needed for mild pain or moderate pain.    unknown  . aspirin EC 81 MG tablet Take 81 mg by mouth daily.    12/18/2019 at Unknown time  . calcium carbonate (CALTRATE 600) 1500 (600 Ca) MG TABS tablet Take 1 tablet by mouth 2 (two) times daily.   12/18/2019 at Unknown time  . cetaphil (CETAPHIL) lotion Apply 1 application topically at bedtime. Applied to feet/ankles   12/17/2019 at Unknown time  . Cholecalciferol 1000 units tablet Take 2,000 Units by mouth daily.    12/18/2019 at Unknown time  . ezetimibe (ZETIA) 10 MG tablet Take 10 mg by mouth daily.   12/17/2019 at Unknown time  . furosemide (LASIX) 20 MG tablet Take 20 mg by mouth daily.   12/18/2019 at Unknown time  . guaiFENesin (MUCINEX) 600 MG 12 hr tablet Take 600 mg by mouth 2 (two) times daily.   12/18/2019 at Unknown time  . loratadine (CLARITIN) 10 MG tablet Take 10 mg by mouth daily.   12/18/2019 at Unknown time  . mycophenolate (CELLCEPT) 500 MG tablet Take 500 mg by mouth 2 (two) times daily.   12/18/2019 at Unknown time  . senna (SENOKOT) 8.6 MG TABS tablet Take 1 tablet by mouth daily.    12/18/2019 at Unknown time  . simvastatin (ZOCOR) 40 MG tablet Take 40 mg by mouth at bedtime.   12/17/2019 at Unknown time  . sodium  bicarbonate 650 MG tablet Take 650 mg by mouth 3 (three) times daily.   12/18/2019 at Unknown time  . tamsulosin (FLOMAX) 0.4 MG CAPS capsule Take 0.4 mg by mouth at bedtime.   12/17/2019 at Unknown time  . zinc sulfate 220 (50 Zn) MG capsule Take 220 mg by mouth daily.   12/18/2019 at Unknown time   Scheduled:  . calcium carbonate  1 tablet Oral BID WC  . furosemide  20 mg Oral Daily  . guaiFENesin  600 mg Oral BID  . mycophenolate  500 mg Oral BID  . senna  1 tablet Oral Daily  . simvastatin  40 mg Oral Daily  . sodium bicarbonate  650 mg Oral TID  . sodium chloride flush  3 mL Intravenous Q12H  . sodium chloride flush  3 mL Intravenous Q12H  . tamsulosin  0.4 mg Oral QHS   Infusions:  . sodium chloride    . heparin     PRN:  Anti-infectives (From admission, onward)   None      Assessment: Brendan [name] a 59 y.o. male  requires anticoagulation with a heparin iv infusion for the indication of  DVT. Heparin gtt will be started following pharmacy protocol per pharmacy consult. Patient is not on previous oral anticoagulant that will require aPTT/HL correlation before transitioning to only HL monitoring.   1/15 AM update:  Heparin level elevated   Goal of Therapy:  Heparin level 0.3-0.7 units/ml Monitor platelets by anticoagulation protocol: Yes   Plan:  Hold heparin x 1 hr Re-start heparin drip at 1300 units/hr at 0230 Re-check heparin level at [place], PharmD, [place] Pharmacist Phone: [phone]

## 2019-12-19 NOTE — Plan of Care (Signed)
  Problem: Education: Goal: Knowledge of General Education information will improve Description Including pain rating scale, medication(s)/side effects and non-pharmacologic comfort measures Outcome: Progressing   Problem: Health Behavior/Discharge Planning: Goal: Ability to manage health-related needs will improve Outcome: Progressing   

## 2019-12-19 NOTE — Progress Notes (Signed)
PROGRESS NOTE    Brendan [name]  [id number] DOB: August 01, 1961 DOA: 12/18/2019 PCP: Cleda Mccreedy, MD   Brief Narrative:  Per HPI: Brendan [name] is a 58 y.o. 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  Patient reports that his Xarelto had been stopped due to gross hematuria, hematuria cleared, but he developed progressive swelling in the right leg with recent ultrasound demonstrating RLE DVT.  Patient denies leg pain, denies cough, denies shortness of breath, denies chest pain, and denies fevers or chills.  1/15: Patient states that he has had some hematuria this morning, but no significant signs of hematuria noted in his urinal at bedside.  He denies any significant pain to his leg, but continues to have swelling on the right side.  Assessment & Plan:   Principal Problem:   Right leg DVT ([place]) Active Problems:   Essential hypertension   Chronic kidney disease, stage IV (severe) (HCC)   Hemiparesis affecting right side as late effect of cerebrovascular accident [facility])   Retroperitoneal fibrosis   Gross hematuria   Right lower extremity DVT with complication of penile bleeding/hematuria -Continue on IV heparin drip and monitor repeat CBC in a.m. as well as further symptoms.  If no further symptoms may resume home Xarelto and follow-up from vascular surgery at a later date -Consider possible transfer to [facility] in the next 24 to 48 hours for potential IVC filter and thrombectomy if needed  Retroperitoneal fibrosis -Continue CellCept  CKD stage IIIb -Continue close monitoring  History of CVA -Continue statin and hold aspirin for now  DVT prophylaxis: IV heparin Code Status:  Full Family Communication: Tried calling mother with no response Disposition Plan: Continue monitoring on IV heparin drip for 24 more hours to ensure no further bleeding.  Possible discharge back to SNF tomorrow.   Consultants:   Discussed with Dr. Delton Coombes  Procedures:   None  Antimicrobials:   None   Subjective: Patient seen and evaluated today with no new acute complaints or concerns. No acute concerns or events noted overnight.  Some hematuria noted this morning.  Objective: Vitals:   12/18/19 2030 12/19/19 0016 12/19/19 0030 12/19/19 0528  BP: 138/81 115/90  (!) 134/91  Pulse: 87 85  85  Resp: 19     Temp:  98.7 F (37.1 C)  99.3 F (37.4 C)  TempSrc:  Oral  Oral  SpO2: 98% 100%  98%  Weight:   112.9 kg   Height:        Intake/Output Summary (Last 24 hours) at 12/19/2019 1030 Last data filed at 12/19/2019 0900 Gross per 24 hour  Intake 360 ml  Output 500 ml  Net -140 ml   Filed Weights   12/18/19 1635 12/19/19 0030  Weight: 113.4 kg 112.9 kg    Examination:  General exam: Appears calm and comfortable  Respiratory system: Clear to auscultation. Respiratory effort normal. Cardiovascular system: S1 & S2 heard, RRR. No JVD, murmurs, rubs, gallops or clicks. No pedal edema. Gastrointestinal system: Abdomen is nondistended, soft and nontender. No organomegaly or masses felt. Normal bowel sounds heard. Central nervous system: Alert and oriented. No focal neurological deficits. Extremities: Right lower extremity swelling noted, no tenderness. Skin: No rashes, lesions or ulcers Psychiatry: Judgement and insight appear normal. Mood &  affect appropriate.     Data Reviewed: I have personally reviewed following labs and imaging studies  CBC: Recent Labs  Lab 12/18/19 1715 12/19/19 0542  WBC 6.8 6.4  NEUTROABS 4.7  --   HGB 14.2 13.5  HCT 46.2 43.7  MCV 89.4 87.8  PLT 180 [id number]   Basic Metabolic Panel: Recent Labs  Lab 12/18/19 1715 12/19/19 0542   NA 138 138  K 4.4 4.7  CL 104 108  CO2 25 21*  GLUCOSE 87 84  BUN 37* 36*  CREATININE 2.48* 2.42*  CALCIUM 8.9 8.5*   GFR: Estimated Creatinine Clearance: 42.7 mL/min (A) (by C-G formula based on SCr of 2.42 mg/dL (H)). Liver Function Tests: Recent Labs  Lab 12/18/19 1715  AST 29  ALT 35  ALKPHOS 86  BILITOT 0.5  PROT 8.4*  ALBUMIN 3.8   No results for input(s): LIPASE, AMYLASE in the last 168 hours. No results for input(s): AMMONIA in the last 168 hours. Coagulation Profile: Recent Labs  Lab 12/18/19 1715  INR 1.1   Cardiac Enzymes: No results for input(s): CKTOTAL, CKMB, CKMBINDEX, TROPONINI in the last 168 hours. BNP (last 3 results) No results for input(s): PROBNP in the last 8760 hours. HbA1C: No results for input(s): HGBA1C in the last 72 hours. CBG: No results for input(s): GLUCAP in the last 168 hours. Lipid Profile: No results for input(s): CHOL, HDL, LDLCALC, TRIG, CHOLHDL, LDLDIRECT in the last 72 hours. Thyroid Function Tests: No results for input(s): TSH, T4TOTAL, FREET4, T3FREE, THYROIDAB in the last 72 hours. Anemia Panel: No results for input(s): VITAMINB12, FOLATE, FERRITIN, TIBC, IRON, RETICCTPCT in the last 72 hours. Sepsis Labs: No results for input(s): PROCALCITON, LATICACIDVEN in the last 168 hours.  Recent Results (from the past 240 hour(s))  Respiratory Panel by RT PCR (Flu A&B, Covid) - Nasopharyngeal Swab     Status: None   Collection Time: 12/18/19  7:07 PM   Specimen: Nasopharyngeal Swab  Result Value Ref Range Status   SARS Coronavirus 2 by RT PCR NEGATIVE NEGATIVE Final    Comment: (NOTE) SARS-CoV-2 target nucleic acids are NOT DETECTED. The SARS-CoV-2 RNA is generally detectable in upper respiratoy specimens during the acute phase of infection. The lowest concentration of SARS-CoV-2 viral copies this assay can detect is 131 copies/mL. A negative result does not preclude SARS-Cov-2 infection and should not be used as the sole  basis for treatment or other patient management decisions. A negative result may occur with  improper specimen collection/handling, submission of specimen other than nasopharyngeal swab, presence of viral mutation(s) within the areas targeted by this assay, and inadequate number of viral copies (<131 copies/mL). A negative result must be combined with clinical observations, patient history, and epidemiological information. The expected result is Negative. Fact Sheet for Patients:  PinkCheek.be Fact Sheet for Healthcare Providers:  GravelBags.it This test is not yet ap proved or cleared by the [place] FDA and  has been authorized for detection and/or diagnosis of SARS-CoV-2 by FDA under an Emergency Use Authorization (EUA). This EUA will remain  in effect (meaning this test can be used) for the duration of the COVID-19 declaration under Section 564(b)(1) of the Act, 21 U.S.C. section 360bbb-3(b)(1), unless the authorization is terminated or revoked sooner.    Influenza A by PCR NEGATIVE NEGATIVE Final   Influenza B by PCR NEGATIVE NEGATIVE Final    Comment: (NOTE) The Xpert Xpress SARS-CoV-2/FLU/RSV assay is intended as an aid in  the diagnosis of influenza from Nasopharyngeal  swab specimens and  should not be used as a sole basis for treatment. Nasal washings and  aspirates are unacceptable for Xpert Xpress SARS-CoV-2/FLU/RSV  testing. Fact Sheet for Patients: PinkCheek.be Fact Sheet for Healthcare Providers: GravelBags.it This test is not yet approved or cleared by the [place] FDA and  has been authorized for detection and/or diagnosis of SARS-CoV-2 by  FDA under an Emergency Use Authorization (EUA). This EUA will remain  in effect (meaning this test can be used) for the duration of the  Covid-19 declaration under Section 564(b)(1) of the Act, 21  U.S.C.  section 360bbb-3(b)(1), unless the authorization is  terminated or revoked. Performed at [facility], [address] [place], [place] [address]   MRSA PCR Screening     Status: Abnormal   Collection Time: 12/19/19 12:11 AM   Specimen: Nasal Mucosa; Nasopharyngeal  Result Value Ref Range Status   MRSA by PCR POSITIVE (A) NEGATIVE Final    Comment:        The GeneXpert MRSA Assay (FDA approved for NASAL specimens only), is one component of a comprehensive MRSA colonization surveillance program. It is not intended to diagnose MRSA infection nor to guide or monitor treatment for MRSA infections. RESULT CALLED TO, READ BACK BY AND VERIFIED WITH: Eriksen S. AT [id number] ON [id number] BY THOMPSON S. Performed at [facility], [address], [place], [place] [address]          Radiology Studies: [facility] Chest [facility] 1 View  Result Date: 12/18/2019 CLINICAL DATA:  DVT EXAM: PORTABLE CHEST 1 VIEW COMPARISON:  03/26/2018 FINDINGS: Heart and mediastinal contours are within normal limits. No focal opacities or effusions. No acute bony abnormality. IMPRESSION: Negative. Electronically Signed   By: Rolm Baptise M.D.   On: 12/18/2019 17:43        Scheduled Meds: . calcium carbonate  1 tablet Oral BID WC  . furosemide  20 mg Oral Daily  . guaiFENesin  600 mg Oral BID  . mycophenolate  500 mg Oral BID  . senna  1 tablet Oral Daily  . simvastatin  40 mg Oral Daily  . sodium bicarbonate  650 mg Oral TID  . sodium chloride flush  3 mL Intravenous Q12H  . sodium chloride flush  3 mL Intravenous Q12H  . tamsulosin  0.4 mg Oral QHS   Continuous Infusions: . sodium chloride    . heparin 1,300 Units/hr (12/19/19 1026)     LOS: 0 days    Time spent: 30 minutes    Dlisa Barnwell Darleen Crocker, DO [facility] Pager [phone]  If 7PM-7AM, please contact night-coverage [email or web address] Password [email or web address] 12/19/2019, 10:30 AM

## 2019-12-19 NOTE — Progress Notes (Addendum)
[place] for Heparin  Indication: DVT  Allergies  Allergen Reactions  . Other     TB serum-reaction unknown. Provided via [facility] records     Patient Measurements: Height: 6\' 1"  (185.4 cm) Weight: 248 lb 14.4 oz (112.9 kg) IBW/kg (Calculated) : 79.9  HEPARIN DW (KG): 103.9   Vital Signs: Temp: 98.8 F (37.1 C) (01/15 1500) Temp Source: Oral (01/15 1500) BP: 129/84 (01/15 1500) Pulse Rate: 81 (01/15 1500)  Labs: Recent Labs    12/18/19 1715 12/19/19 0050 12/19/19 0542 12/19/19 0958 12/19/19 1912  HGB 14.2  --  13.5  --   --   HCT 46.2  --  43.7  --   --   PLT 180  --  189  --   --   APTT 33  --   --   --   --   LABPROT 13.8  --   --   --   --   INR 1.1  --   --   --   --   HEPARINUNFRC  --  1.13*  --  1.00* 0.45  CREATININE 2.48*  --  2.42*  --   --     Estimated Creatinine Clearance: 42.7 mL/min (A) (by C-G formula based on SCr of 2.42 mg/dL (H)).   Medical History: Past Medical History:  Diagnosis Date  . Acidosis   . Acute unilateral obstructive uropathy   . Allergic rhinitis   . Chronic kidney disease, stage IV (severe) ([place])   . Constipation   . Coronary atherosclerosis of native coronary artery   . Diabetes mellitus without complication ([place])   . Enlarged prostate with urinary obstruction   . Essential hypertension, malignant   . GERD (gastroesophageal reflux disease)   . Glaucoma   . Hemiparesis affecting right side as late effect of cerebrovascular accident ([place])   . Hemiplegia ([place])   . Hydronephrosis   . Hyperlipidemia   . Hypertension   . Hypokalemia   . Muscle weakness (generalized)   . PVD (peripheral vascular disease) ([place])   . Retroperitoneal fibrosis   . Stricture or kinking of ureter   . Stroke [facility])    right sided hemiparesis  . Vitamin D deficiency     Medications:  Medications Prior to Admission  Medication Sig Dispense Refill Last Dose  . acetaminophen (TYLENOL) 325 MG tablet Take 650  mg by mouth every 8 (eight) hours as needed for mild pain or moderate pain.    unknown  . aspirin EC 81 MG tablet Take 81 mg by mouth daily.   12/18/2019 at Unknown time  . calcium carbonate (CALTRATE 600) 1500 (600 Ca) MG TABS tablet Take 1 tablet by mouth 2 (two) times daily.   12/18/2019 at Unknown time  . cetaphil (CETAPHIL) lotion Apply 1 application topically at bedtime. Applied to feet/ankles   12/17/2019 at Unknown time  . Cholecalciferol 1000 units tablet Take 2,000 Units by mouth daily.    12/18/2019 at Unknown time  . ezetimibe (ZETIA) 10 MG tablet Take 10 mg by mouth daily.   12/17/2019 at Unknown time  . furosemide (LASIX) 20 MG tablet Take 20 mg by mouth daily.   12/18/2019 at Unknown time  . guaiFENesin (MUCINEX) 600 MG 12 hr tablet Take 600 mg by mouth 2 (two) times daily.   12/18/2019 at Unknown time  . loratadine (CLARITIN) 10 MG tablet Take 10 mg by mouth daily.   12/18/2019 at Unknown time  .  mycophenolate (CELLCEPT) 500 MG tablet Take 500 mg by mouth 2 (two) times daily.   12/18/2019 at Unknown time  . senna (SENOKOT) 8.6 MG TABS tablet Take 1 tablet by mouth daily.    12/18/2019 at Unknown time  . simvastatin (ZOCOR) 40 MG tablet Take 40 mg by mouth at bedtime.   12/17/2019 at Unknown time  . sodium bicarbonate 650 MG tablet Take 650 mg by mouth 3 (three) times daily.   12/18/2019 at Unknown time  . tamsulosin (FLOMAX) 0.4 MG CAPS capsule Take 0.4 mg by mouth at bedtime.   12/17/2019 at Unknown time  . zinc sulfate 220 (50 Zn) MG capsule Take 220 mg by mouth daily.   12/18/2019 at Unknown time   Scheduled:  . calcium carbonate  1 tablet Oral BID WC  . furosemide  20 mg Oral Daily  . guaiFENesin  600 mg Oral BID  . mycophenolate  500 mg Oral BID  . senna  1 tablet Oral Daily  . simvastatin  40 mg Oral Daily  . sodium bicarbonate  650 mg Oral TID  . sodium chloride flush  3 mL Intravenous Q12H  . sodium chloride flush  3 mL Intravenous Q12H  . tamsulosin  0.4 mg Oral QHS    Infusions:  . sodium chloride    . heparin 1,000 Units/hr (12/19/19 1600)   PRN:  Anti-infectives (From admission, onward)   None      Assessment: Brendan [name], a 59 y.o. male, requires anticoagulation with a heparin infusion for treatment of DVT. Heparin infusion will be started following pharmacy protocol per pharmacy consult. Patient is not on previous oral anticoagulant that will require aPTT/HL correlation before transitioning to only heparin level monitoring. (Patient does have hx of DVT, for which he was treated with Xarelto in the past.)  Heparin level ~5 hrs after heparin infusion was held X 1 hr (for supra-therapeutic heparin level of 1.0 units/ml earlier today), then restarted at 1000 units/hr, is 0.45 units/ml, which is within the goal range for this patient. H/H, platelets are WNL. Per RN, no issues with IV. RN stated that urine in pt's bag is still tea-colored; she is going to change the bag now and will monitor for hematuria.  Goal of Therapy:  Heparin level 0.3-0.7 units/ml Monitor platelets by anticoagulation protocol: Yes   Plan:  Continue heparin infusion at 1000 units/hr  Check 6-hr heparin level Monitor daily heparin level, CBC Monitor for signs/symptoms of bleeding  Gillermina Hu, PharmD, BCPS, [facility] Clinical Pharmacist 12/19/2019 8:09 PM

## 2019-12-19 NOTE — Progress Notes (Signed)
[place] for Heparin  Indication: DVT  Allergies  Allergen Reactions  . Other     TB serum-reaction unknown. Provided via [facility] records     Patient Measurements: Height: 6\' 1"  (185.4 cm) Weight: 248 lb 14.4 oz (112.9 kg) IBW/kg (Calculated) : 79.9 Heparin Dosing Weight: HEPARIN DW (KG): 103.9   Vital Signs: Temp: 99.3 F (37.4 C) (01/15 0528) Temp Source: Oral (01/15 0528) BP: 134/91 (01/15 0528) Pulse Rate: 85 (01/15 0528)  Labs: Recent Labs    12/18/19 1715 12/19/19 0050 12/19/19 0542 12/19/19 0958  HGB 14.2  --  13.5  --   HCT 46.2  --  43.7  --   PLT 180  --  189  --   APTT 33  --   --   --   LABPROT 13.8  --   --   --   INR 1.1  --   --   --   HEPARINUNFRC  --  1.13*  --  1.00*  CREATININE 2.48*  --  2.42*  --     Estimated Creatinine Clearance: 42.7 mL/min (A) (by C-G formula based on SCr of 2.42 mg/dL (H)).   Medical History: Past Medical History:  Diagnosis Date  . Acidosis   . Acute unilateral obstructive uropathy   . Allergic rhinitis   . Chronic kidney disease, stage IV (severe) ([place])   . Constipation   . Coronary atherosclerosis of native coronary artery   . Diabetes mellitus without complication ([place])   . Enlarged prostate with urinary obstruction   . Essential hypertension, malignant   . GERD (gastroesophageal reflux disease)   . Glaucoma   . Hemiparesis affecting right side as late effect of cerebrovascular accident ([place])   . Hemiplegia ([place])   . Hydronephrosis   . Hyperlipidemia   . Hypertension   . Hypokalemia   . Muscle weakness (generalized)   . PVD (peripheral vascular disease) ([place])   . Retroperitoneal fibrosis   . Stricture or kinking of ureter   . Stroke [facility])    right sided hemiparesis  . Vitamin D deficiency     Medications:  Medications Prior to Admission  Medication Sig Dispense Refill Last Dose  . acetaminophen (TYLENOL) 325 MG tablet Take 650 mg by mouth every 8 (eight)  hours as needed for mild pain or moderate pain.    unknown  . aspirin EC 81 MG tablet Take 81 mg by mouth daily.   12/18/2019 at Unknown time  . calcium carbonate (CALTRATE 600) 1500 (600 Ca) MG TABS tablet Take 1 tablet by mouth 2 (two) times daily.   12/18/2019 at Unknown time  . cetaphil (CETAPHIL) lotion Apply 1 application topically at bedtime. Applied to feet/ankles   12/17/2019 at Unknown time  . Cholecalciferol 1000 units tablet Take 2,000 Units by mouth daily.    12/18/2019 at Unknown time  . ezetimibe (ZETIA) 10 MG tablet Take 10 mg by mouth daily.   12/17/2019 at Unknown time  . furosemide (LASIX) 20 MG tablet Take 20 mg by mouth daily.   12/18/2019 at Unknown time  . guaiFENesin (MUCINEX) 600 MG 12 hr tablet Take 600 mg by mouth 2 (two) times daily.   12/18/2019 at Unknown time  . loratadine (CLARITIN) 10 MG tablet Take 10 mg by mouth daily.   12/18/2019 at Unknown time  . mycophenolate (CELLCEPT) 500 MG tablet Take 500 mg by mouth 2 (two) times daily.   12/18/2019 at Unknown time  .  senna (SENOKOT) 8.6 MG TABS tablet Take 1 tablet by mouth daily.    12/18/2019 at Unknown time  . simvastatin (ZOCOR) 40 MG tablet Take 40 mg by mouth at bedtime.   12/17/2019 at Unknown time  . sodium bicarbonate 650 MG tablet Take 650 mg by mouth 3 (three) times daily.   12/18/2019 at Unknown time  . tamsulosin (FLOMAX) 0.4 MG CAPS capsule Take 0.4 mg by mouth at bedtime.   12/17/2019 at Unknown time  . zinc sulfate 220 (50 Zn) MG capsule Take 220 mg by mouth daily.   12/18/2019 at Unknown time   Scheduled:  . calcium carbonate  1 tablet Oral BID WC  . furosemide  20 mg Oral Daily  . guaiFENesin  600 mg Oral BID  . mycophenolate  500 mg Oral BID  . senna  1 tablet Oral Daily  . simvastatin  40 mg Oral Daily  . sodium bicarbonate  650 mg Oral TID  . sodium chloride flush  3 mL Intravenous Q12H  . sodium chloride flush  3 mL Intravenous Q12H  . tamsulosin  0.4 mg Oral QHS   Infusions:  . sodium chloride    .  heparin 1,300 Units/hr (12/19/19 1026)   PRN:  Anti-infectives (From admission, onward)   None      Assessment: Brendan [name] a 59 y.o. male requires anticoagulation with a heparin iv infusion for the indication of  DVT. Heparin gtt will be started following pharmacy protocol per pharmacy consult. Patient is not on previous oral anticoagulant that will require aPTT/HL correlation before transitioning to only HL monitoring.   1/15 AM update:  Heparin level elevated at 1.00  Goal of Therapy:  Heparin level 0.3-0.7 units/ml Monitor platelets by anticoagulation protocol: Yes   Plan:  Hold heparin x 1 hr Re-start heparin drip at 1000 units/hr  Re-check heparin level in 6 hours and daily. Monitor H&H   Margot Ables, PharmD Clinical Pharmacist 12/19/2019 11:17 AM

## 2019-12-20 LAB — BASIC METABOLIC PANEL
Anion gap: 10 (ref 5–15)
BUN: 33 mg/dL — ABNORMAL HIGH (ref 6–20)
CO2: 23 mmol/L (ref 22–32)
Calcium: 8.8 mg/dL — ABNORMAL LOW (ref 8.9–10.3)
Chloride: 105 mmol/L (ref 98–111)
Creatinine, Ser: 2.45 mg/dL — ABNORMAL HIGH (ref 0.61–1.24)
GFR calc Af Amer: 32 mL/min — ABNORMAL LOW (ref >60)
GFR calc non Af Amer: 28 mL/min — ABNORMAL LOW (ref >60)
Glucose, Bld: 120 mg/dL — ABNORMAL HIGH (ref 70–99)
Potassium: 4.3 mmol/L (ref 3.5–5.1)
Sodium: 138 mmol/L (ref 135–145)

## 2019-12-20 LAB — CBC
HCT: 44.1 % (ref 39.0–52.0)
Hemoglobin: 13.6 g/dL (ref 13.0–17.0)
MCH: 27.3 pg (ref 26.0–34.0)
MCHC: 30.8 g/dL (ref 30.0–36.0)
MCV: 88.4 fL (ref 80.0–100.0)
Platelets: 175 10*3/uL (ref 150–400)
RBC: 4.99 MIL/uL (ref 4.22–5.81)
RDW: 16 % — ABNORMAL HIGH (ref 11.5–15.5)
WBC: 6.3 10*3/uL (ref 4.0–10.5)
nRBC: 0 % (ref 0.0–0.2)

## 2019-12-20 LAB — HEPARIN LEVEL (UNFRACTIONATED): Heparin Unfractionated: 0.55 IU/mL (ref 0.30–0.70)

## 2019-12-20 MED ORDER — ENOXAPARIN SODIUM 120 MG/0.8ML ~~LOC~~ SOLN
110.0000 mg | Freq: Two times a day (BID) | SUBCUTANEOUS | Status: DC
  Administered 2019-12-20 – 2019-12-22 (×5): 110 mg via SUBCUTANEOUS
  Filled 2019-12-20 (×5): qty 0.8

## 2019-12-20 NOTE — Progress Notes (Signed)
Brendan [name] for Lovenox (switch from heparin) Indication: DVT  Allergies  Allergen Reactions  . Other     TB serum-reaction unknown. Provided via [facility] records     Patient Measurements: Height: 6\' 1"  (185.4 cm) Weight: 248 lb 14.4 oz (112.9 kg) IBW/kg (Calculated) : 79.9  HEPARIN DW (KG): 103.9   Vital Signs: Temp: 98.3 F (36.8 C) (01/16 0614) Temp Source: Oral (01/16 0614) BP: 137/92 (01/16 0614) Pulse Rate: 83 (01/16 0614)  Labs: Recent Labs    12/18/19 1715 12/19/19 0050 12/19/19 0542 12/19/19 0958 12/19/19 1912 12/20/19 0845  HGB 14.2  --  13.5  --   --  13.6  HCT 46.2  --  43.7  --   --  44.1  PLT 180  --  189  --   --  175  APTT 33  --   --   --   --   --   LABPROT 13.8  --   --   --   --   --   INR 1.1  --   --   --   --   --   HEPARINUNFRC  --    < >  --  1.00* 0.45 0.55  CREATININE 2.48*  --  2.42*  --   --  2.45*   < > = values in this interval not displayed.    Estimated Creatinine Clearance: 42.2 mL/min (A) (by C-G formula based on SCr of 2.45 mg/dL (H)).   Medical History: Past Medical History:  Diagnosis Date  . Acidosis   . Acute unilateral obstructive uropathy   . Allergic rhinitis   . Chronic kidney disease, stage IV (severe) ([place])   . Constipation   . Coronary atherosclerosis of native coronary artery   . Diabetes mellitus without complication ([place])   . Enlarged prostate with urinary obstruction   . Essential hypertension, malignant   . GERD (gastroesophageal reflux disease)   . Glaucoma   . Hemiparesis affecting right side as late effect of cerebrovascular accident ([place])   . Hemiplegia ([place])   . Hydronephrosis   . Hyperlipidemia   . Hypertension   . Hypokalemia   . Muscle weakness (generalized)   . PVD (peripheral vascular disease) ([place])   . Retroperitoneal fibrosis   . Stricture or kinking of ureter   . Stroke [facility])    right sided hemiparesis  . Vitamin D deficiency     Medications:   Medications Prior to Admission  Medication Sig Dispense Refill Last Dose  . acetaminophen (TYLENOL) 325 MG tablet Take 650 mg by mouth every 8 (eight) hours as needed for mild pain or moderate pain.    unknown  . aspirin EC 81 MG tablet Take 81 mg by mouth daily.   12/18/2019 at Unknown time  . calcium carbonate (CALTRATE 600) 1500 (600 Ca) MG TABS tablet Take 1 tablet by mouth 2 (two) times daily.   12/18/2019 at Unknown time  . cetaphil (CETAPHIL) lotion Apply 1 application topically at bedtime. Applied to feet/ankles   12/17/2019 at Unknown time  . Cholecalciferol 1000 units tablet Take 2,000 Units by mouth daily.    12/18/2019 at Unknown time  . ezetimibe (ZETIA) 10 MG tablet Take 10 mg by mouth daily.   12/17/2019 at Unknown time  . furosemide (LASIX) 20 MG tablet Take 20 mg by mouth daily.   12/18/2019 at Unknown time  . guaiFENesin (MUCINEX) 600 MG 12 hr tablet Take 600 mg by  mouth 2 (two) times daily.   12/18/2019 at Unknown time  . loratadine (CLARITIN) 10 MG tablet Take 10 mg by mouth daily.   12/18/2019 at Unknown time  . mycophenolate (CELLCEPT) 500 MG tablet Take 500 mg by mouth 2 (two) times daily.   12/18/2019 at Unknown time  . senna (SENOKOT) 8.6 MG TABS tablet Take 1 tablet by mouth daily.    12/18/2019 at Unknown time  . simvastatin (ZOCOR) 40 MG tablet Take 40 mg by mouth at bedtime.   12/17/2019 at Unknown time  . sodium bicarbonate 650 MG tablet Take 650 mg by mouth 3 (three) times daily.   12/18/2019 at Unknown time  . tamsulosin (FLOMAX) 0.4 MG CAPS capsule Take 0.4 mg by mouth at bedtime.   12/17/2019 at Unknown time  . zinc sulfate 220 (50 Zn) MG capsule Take 220 mg by mouth daily.   12/18/2019 at Unknown time   Scheduled:  . calcium carbonate  1 tablet Oral BID WC  . Chlorhexidine Gluconate Cloth  6 each Topical Q0600  . enoxaparin (LOVENOX) injection  110 mg Subcutaneous Q12H  . furosemide  20 mg Oral Daily  . guaiFENesin  600 mg Oral BID  . mupirocin ointment  1 application  Nasal BID  . mycophenolate  500 mg Oral BID  . senna  1 tablet Oral Daily  . simvastatin  40 mg Oral Daily  . sodium bicarbonate  650 mg Oral TID  . sodium chloride flush  3 mL Intravenous Q12H  . sodium chloride flush  3 mL Intravenous Q12H  . tamsulosin  0.4 mg Oral QHS   Infusions:  . sodium chloride     PRN:  Anti-infectives (From admission, onward)   None      Assessment: Brendan [name], a 59 y.o. male, requires anticoagulation with conversion to lovenox from heparin infusion for treatment of DVT.  Patient has hematuria noted in nephrostomy bag but no bleeding through urethra. Hematology recommends Lovenox.   Goal of Therapy:   Monitor platelets by anticoagulation protocol: Yes   Plan:  Discontinue heparin infusion Start lovenox 110 mg subq every 12 hours. Monitor daily heparin level, CBC Monitor for signs/symptoms of bleeding  Margot Ables, PharmD Clinical Pharmacist 12/20/2019 11:25 AM

## 2019-12-20 NOTE — Progress Notes (Signed)
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 Patient reports that his Xarelto had been stopped due to gross hematuria, hematuria cleared, but he developed progressive swelling in the right leg with recent ultrasound demonstrating RLE DVT. Patient denies leg pain, denies cough, denies shortness of breath, denies chest pain, and denies fevers or chills.  1/15: Patient states that he has had some hematuria this morning, but no significant signs of hematuria noted in his urinal at bedside.  He denies any significant pain to his leg, but continues to have swelling on the right side.  1/16: Patient continues to have hematuria noted in nephrostomy bag, but no bleeding through the urethra.  He denies significant pain to his leg.  Discussed case with hematology with recommendations for switching over to full dose Lovenox for now and discontinuing heparin.  Monitor a.m. labs and consider discharge on Lovenox injections back to SNF by tomorrow if stable.  Assessment & Plan:   Principal Problem:   Right leg DVT (HCC) Active Problems:   Essential hypertension   Chronic kidney disease, stage IV (severe) (HCC)   Hemiparesis affecting right side as late effect of cerebrovascular accident [facility])   Retroperitoneal fibrosis   Gross hematuria   DVT (deep venous thrombosis) (HCC)  Right lower extremity DVT with complication of penile bleeding/hematuria -Ongoing hematuria noted in nephrostomy bag -Discontinue IV heparin drip repeat CBC in a.m. while on full  dose Lovenox -May consider discharge on full dose Lovenox injections prior to discharge  Retroperitoneal fibrosis -Continue CellCept  CKD stage IIIb -Continue close monitoring  History of CVA -Continue statin and hold aspirin for now  DVT prophylaxis: IV heparin Code Status: Full Family Communication: Tried calling mother with no response Disposition Plan: Continue now on full dose Lovenox and monitor another 24 hours with repeat CBC.  Possible discharge back to SNF tomorrow if stable on Lovenox.   Consultants:   Discussed with Dr. Delton Coombes  Procedures:   None  Antimicrobials:   None  Subjective: Patient seen and evaluated today with some ongoing hematuria noted in nephrostomy bag.  Hemoglobin levels are stable.  Denies any pain in his leg.  Continues to have persistent edema due to DVT.  Objective: Vitals:   12/19/19 0528 12/19/19 1500 12/19/19 2152 12/20/19 0614  BP: (!) 134/91 129/84 133/88 (!) 137/92  Pulse: 85 81 78 83  Resp:  18 17 18   Temp: 99.3 F (37.4 C) 98.8 F (37.1 C) 98.4 F (36.9 C) 98.3 F (36.8 C)  TempSrc: Oral Oral Oral Oral  SpO2: 98% 99% 100% 98%  Weight:      Height:        Intake/Output Summary (Last 24 hours) at 12/20/2019 1057 Last data filed at 12/20/2019 0900 Gross per 24 hour  Intake 1196.22 ml  Output 2700 ml  Net -1503.78 ml   Filed Weights   12/18/19 1635 12/19/19 0030  Weight: 113.4 kg 112.9 kg    Examination:  General exam: Appears calm and comfortable  Respiratory system: Clear to auscultation. Respiratory effort normal. Cardiovascular system: S1 & S2 heard, RRR. No JVD, murmurs, rubs, gallops  or clicks. No pedal edema. Gastrointestinal system: Abdomen is nondistended, soft and nontender. No organomegaly or masses felt. Normal bowel sounds heard. Central nervous system: Alert and oriented. No focal neurological deficits. Extremities: Symmetric 5 x 5 power. Skin: No rashes, lesions or ulcers Psychiatry:  Judgement and insight appear normal. Mood & affect appropriate.  Hematuria noted in nephrostomy bag.    Data Reviewed: I have personally reviewed following labs and imaging studies  CBC: Recent Labs  Lab 12/18/19 1715 12/19/19 0542 12/20/19 0845  WBC 6.8 6.4 6.3  NEUTROABS 4.7  --   --   HGB 14.2 13.5 13.6  HCT 46.2 43.7 44.1  MCV 89.4 87.8 88.4  PLT 180 189 0000000   Basic Metabolic Panel: Recent Labs  Lab 12/18/19 1715 12/19/19 0542 12/20/19 0845  NA 138 138 138  K 4.4 4.7 4.3  CL 104 108 105  CO2 25 21* 23  GLUCOSE 87 84 120*  BUN 37* 36* 33*  CREATININE 2.48* 2.42* 2.45*  CALCIUM 8.9 8.5* 8.8*   GFR: Estimated Creatinine Clearance: 42.2 mL/min (A) (by C-G formula based on SCr of 2.45 mg/dL (H)). Liver Function Tests: Recent Labs  Lab 12/18/19 1715  AST 29  ALT 35  ALKPHOS 86  BILITOT 0.5  PROT 8.4*  ALBUMIN 3.8   No results for input(s): LIPASE, AMYLASE in the last 168 hours. No results for input(s): AMMONIA in the last 168 hours. Coagulation Profile: Recent Labs  Lab 12/18/19 1715  INR 1.1   Cardiac Enzymes: No results for input(s): CKTOTAL, CKMB, CKMBINDEX, TROPONINI in the last 168 hours. BNP (last 3 results) No results for input(s): PROBNP in the last 8760 hours. HbA1C: No results for input(s): HGBA1C in the last 72 hours. CBG: No results for input(s): GLUCAP in the last 168 hours. Lipid Profile: No results for input(s): CHOL, HDL, LDLCALC, TRIG, CHOLHDL, LDLDIRECT in the last 72 hours. Thyroid Function Tests: No results for input(s): TSH, T4TOTAL, FREET4, T3FREE, THYROIDAB in the last 72 hours. Anemia Panel: No results for input(s): VITAMINB12, FOLATE, FERRITIN, TIBC, IRON, RETICCTPCT in the last 72 hours. Sepsis Labs: No results for input(s): PROCALCITON, LATICACIDVEN in the last 168 hours.  Recent Results (from the past 240 hour(s))  Respiratory Panel by RT PCR (Flu A&B, Covid) - Nasopharyngeal Swab     Status: None   Collection Time:  12/18/19  7:07 PM   Specimen: Nasopharyngeal Swab  Result Value Ref Range Status   SARS Coronavirus 2 by RT PCR NEGATIVE NEGATIVE Final    Comment: (NOTE) SARS-CoV-2 target nucleic acids are NOT DETECTED. The SARS-CoV-2 RNA is generally detectable in upper respiratoy specimens during the acute phase of infection. The lowest concentration of SARS-CoV-2 viral copies this assay can detect is 131 copies/mL. A negative result does not preclude SARS-Cov-2 infection and should not be used as the sole basis for treatment or other patient management decisions. A negative result may occur with  improper specimen collection/handling, submission of specimen other than nasopharyngeal swab, presence of viral mutation(s) within the areas targeted by this assay, and inadequate number of viral copies (<131 copies/mL). A negative result must be combined with clinical observations, patient history, and epidemiological information. The expected result is Negative. Fact Sheet for Patients:  PinkCheek.be Fact Sheet for Healthcare Providers:  GravelBags.it This test is not yet ap proved or cleared by the [place] FDA and  has been authorized for detection and/or diagnosis of SARS-CoV-2 by FDA under an Emergency Use Authorization (EUA). This EUA will  remain  in effect (meaning this test can be used) for the duration of the COVID-19 declaration under Section 564(b)(1) of the Act, 21 U.S.C. section 360bbb-3(b)(1), unless the authorization is terminated or revoked sooner.    Influenza A by PCR NEGATIVE NEGATIVE Final   Influenza B by PCR NEGATIVE NEGATIVE Final    Comment: (NOTE) The Xpert Xpress SARS-CoV-2/FLU/RSV assay is intended as an aid in  the diagnosis of influenza from Nasopharyngeal swab specimens and  should not be used as a sole basis for treatment. Nasal washings and  aspirates are unacceptable for Xpert Xpress SARS-CoV-2/FLU/RSV   testing. Fact Sheet for Patients: PinkCheek.be Fact Sheet for Healthcare Providers: GravelBags.it This test is not yet approved or cleared by the [place] FDA and  has been authorized for detection and/or diagnosis of SARS-CoV-2 by  FDA under an Emergency Use Authorization (EUA). This EUA will remain  in effect (meaning this test can be used) for the duration of the  Covid-19 declaration under Section 564(b)(1) of the Act, 21  U.S.C. section 360bbb-3(b)(1), unless the authorization is  terminated or revoked. Performed at [facility], [address] [place], [place] [address]   MRSA PCR Screening     Status: Abnormal   Collection Time: 12/19/19 12:11 AM   Specimen: Nasal Mucosa; Nasopharyngeal  Result Value Ref Range Status   MRSA by PCR POSITIVE (A) NEGATIVE Final    Comment:        The GeneXpert MRSA Assay (FDA approved for NASAL specimens only), is one component of a comprehensive MRSA colonization surveillance program. It is not intended to diagnose MRSA infection nor to guide or monitor treatment for MRSA infections. RESULT CALLED TO, READ BACK BY AND VERIFIED WITH: Bortner S. AT [id number] ON RN:[id number] BY THOMPSON S. Performed at [facility], [address], [place], [place] [address]          Radiology Studies: [facility] Chest [facility] 1 View  Result Date: 12/18/2019 CLINICAL DATA:  DVT EXAM: PORTABLE CHEST 1 VIEW COMPARISON:  03/26/2018 FINDINGS: Heart and mediastinal contours are within normal limits. No focal opacities or effusions. No acute bony abnormality. IMPRESSION: Negative. Electronically Signed   By: Rolm Baptise M.D.   On: 12/18/2019 17:43        Scheduled Meds: . calcium carbonate  1 tablet Oral BID WC  . Chlorhexidine Gluconate Cloth  6 each Topical Q0600  . furosemide  20 mg Oral Daily  . guaiFENesin  600 mg Oral BID  . mupirocin ointment  1 application Nasal BID  . mycophenolate  500 mg Oral BID   . senna  1 tablet Oral Daily  . simvastatin  40 mg Oral Daily  . sodium bicarbonate  650 mg Oral TID  . sodium chloride flush  3 mL Intravenous Q12H  . sodium chloride flush  3 mL Intravenous Q12H  . tamsulosin  0.4 mg Oral QHS   Continuous Infusions: . sodium chloride       LOS: 1 day    Time spent: 30 minutes    Bev Drennen Darleen Crocker, DO [facility] Pager [phone]  If 7PM-7AM, please contact night-coverage [email or web address] Password [facility] 12/20/2019, 10:57 AM

## 2019-12-20 NOTE — Plan of Care (Signed)

## 2019-12-20 NOTE — Progress Notes (Signed)
[place] for Heparin  Indication: DVT  Allergies  Allergen Reactions  . Other     TB serum-reaction unknown. Provided via [facility] records     Patient Measurements: Height: 6\' 1"  (185.4 cm) Weight: 248 lb 14.4 oz (112.9 kg) IBW/kg (Calculated) : 79.9  HEPARIN DW (KG): 103.9   Vital Signs: Temp: 98.3 F (36.8 C) (01/16 0614) Temp Source: Oral (01/16 0614) BP: 137/92 (01/16 0614) Pulse Rate: 83 (01/16 0614)  Labs: Recent Labs    12/18/19 1715 12/19/19 0050 12/19/19 0542 12/19/19 0958 12/19/19 1912 12/20/19 0845  HGB 14.2  --  13.5  --   --  13.6  HCT 46.2  --  43.7  --   --  44.1  PLT 180  --  189  --   --  175  APTT 33  --   --   --   --   --   LABPROT 13.8  --   --   --   --   --   INR 1.1  --   --   --   --   --   HEPARINUNFRC  --    < >  --  1.00* 0.45 0.55  CREATININE 2.48*  --  2.42*  --   --  2.45*   < > = values in this interval not displayed.    Estimated Creatinine Clearance: 42.2 mL/min (A) (by C-G formula based on SCr of 2.45 mg/dL (H)).   Medical History: Past Medical History:  Diagnosis Date  . Acidosis   . Acute unilateral obstructive uropathy   . Allergic rhinitis   . Chronic kidney disease, stage IV (severe) ([place])   . Constipation   . Coronary atherosclerosis of native coronary artery   . Diabetes mellitus without complication ([place])   . Enlarged prostate with urinary obstruction   . Essential hypertension, malignant   . GERD (gastroesophageal reflux disease)   . Glaucoma   . Hemiparesis affecting right side as late effect of cerebrovascular accident ([place])   . Hemiplegia ([place])   . Hydronephrosis   . Hyperlipidemia   . Hypertension   . Hypokalemia   . Muscle weakness (generalized)   . PVD (peripheral vascular disease) ([place])   . Retroperitoneal fibrosis   . Stricture or kinking of ureter   . Stroke [facility])    right sided hemiparesis  . Vitamin D deficiency     Medications:  Medications Prior to  Admission  Medication Sig Dispense Refill Last Dose  . acetaminophen (TYLENOL) 325 MG tablet Take 650 mg by mouth every 8 (eight) hours as needed for mild pain or moderate pain.    unknown  . aspirin EC 81 MG tablet Take 81 mg by mouth daily.   12/18/2019 at Unknown time  . calcium carbonate (CALTRATE 600) 1500 (600 Ca) MG TABS tablet Take 1 tablet by mouth 2 (two) times daily.   12/18/2019 at Unknown time  . cetaphil (CETAPHIL) lotion Apply 1 application topically at bedtime. Applied to feet/ankles   12/17/2019 at Unknown time  . Cholecalciferol 1000 units tablet Take 2,000 Units by mouth daily.    12/18/2019 at Unknown time  . ezetimibe (ZETIA) 10 MG tablet Take 10 mg by mouth daily.   12/17/2019 at Unknown time  . furosemide (LASIX) 20 MG tablet Take 20 mg by mouth daily.   12/18/2019 at Unknown time  . guaiFENesin (MUCINEX) 600 MG 12 hr tablet Take 600 mg by mouth 2 (  two) times daily.   12/18/2019 at Unknown time  . loratadine (CLARITIN) 10 MG tablet Take 10 mg by mouth daily.   12/18/2019 at Unknown time  . mycophenolate (CELLCEPT) 500 MG tablet Take 500 mg by mouth 2 (two) times daily.   12/18/2019 at Unknown time  . senna (SENOKOT) 8.6 MG TABS tablet Take 1 tablet by mouth daily.    12/18/2019 at Unknown time  . simvastatin (ZOCOR) 40 MG tablet Take 40 mg by mouth at bedtime.   12/17/2019 at Unknown time  . sodium bicarbonate 650 MG tablet Take 650 mg by mouth 3 (three) times daily.   12/18/2019 at Unknown time  . tamsulosin (FLOMAX) 0.4 MG CAPS capsule Take 0.4 mg by mouth at bedtime.   12/17/2019 at Unknown time  . zinc sulfate 220 (50 Zn) MG capsule Take 220 mg by mouth daily.   12/18/2019 at Unknown time   Scheduled:  . calcium carbonate  1 tablet Oral BID WC  . Chlorhexidine Gluconate Cloth  6 each Topical Q0600  . furosemide  20 mg Oral Daily  . guaiFENesin  600 mg Oral BID  . mupirocin ointment  1 application Nasal BID  . mycophenolate  500 mg Oral BID  . senna  1 tablet Oral Daily  .  simvastatin  40 mg Oral Daily  . sodium bicarbonate  650 mg Oral TID  . sodium chloride flush  3 mL Intravenous Q12H  . sodium chloride flush  3 mL Intravenous Q12H  . tamsulosin  0.4 mg Oral QHS   Infusions:  . sodium chloride    . heparin 1,000 Units/hr (12/19/19 1600)   PRN:  Anti-infectives (From admission, onward)   None      Assessment: Brendan [name], a 59 y.o. male, requires anticoagulation with a heparin infusion for treatment of DVT. Heparin infusion will be started following pharmacy protocol per pharmacy consult. Patient is not on previous oral anticoagulant that will require aPTT/HL correlation before transitioning to only heparin level monitoring. (Patient does have hx of DVT, for which he was treated with Xarelto in the past.)  Heparin level therapeutic at 0.55  Goal of Therapy:  Heparin level 0.3-0.7 units/ml Monitor platelets by anticoagulation protocol: Yes   Plan:  Continue heparin infusion at 1000 units/hr  Monitor daily heparin level, CBC Monitor for signs/symptoms of bleeding  Margot Ables, PharmD Clinical Pharmacist 12/20/2019 9:48 AM

## 2019-12-21 ENCOUNTER — Inpatient Hospital Stay (HOSPITAL_COMMUNITY): Payer: Medicaid Other

## 2019-12-21 LAB — BASIC METABOLIC PANEL
Anion gap: 7 (ref 5–15)
BUN: 33 mg/dL — ABNORMAL HIGH (ref 6–20)
CO2: 24 mmol/L (ref 22–32)
Calcium: 8.7 mg/dL — ABNORMAL LOW (ref 8.9–10.3)
Chloride: 106 mmol/L (ref 98–111)
Creatinine, Ser: 2.37 mg/dL — ABNORMAL HIGH (ref 0.61–1.24)
GFR calc Af Amer: 33 mL/min — ABNORMAL LOW (ref >60)
GFR calc non Af Amer: 29 mL/min — ABNORMAL LOW (ref >60)
Glucose, Bld: 119 mg/dL — ABNORMAL HIGH (ref 70–99)
Potassium: 4.3 mmol/L (ref 3.5–5.1)
Sodium: 137 mmol/L (ref 135–145)

## 2019-12-21 LAB — CBC
HCT: 42.1 % (ref 39.0–52.0)
Hemoglobin: 13 g/dL (ref 13.0–17.0)
MCH: 27.1 pg (ref 26.0–34.0)
MCHC: 30.9 g/dL (ref 30.0–36.0)
MCV: 87.7 fL (ref 80.0–100.0)
Platelets: 182 10*3/uL (ref 150–400)
RBC: 4.8 MIL/uL (ref 4.22–5.81)
RDW: 15.9 % — ABNORMAL HIGH (ref 11.5–15.5)
WBC: 5.9 10*3/uL (ref 4.0–10.5)
nRBC: 0 % (ref 0.0–0.2)

## 2019-12-21 NOTE — Progress Notes (Signed)
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 Patient reports that his Xarelto had been stopped due to gross hematuria, hematuria cleared, but he developed progressive swelling in the right leg with recent ultrasound demonstrating RLE DVT. Patient denies leg pain, denies cough, denies shortness of breath, denies chest pain, and denies fevers or chills.  1/15: Patient states that he has had some hematuria this morning, but no significant signs of hematuria noted in his urinal at bedside. He denies any significant pain to his leg, but continues to have swelling on the right side.  1/16: Patient continues to have hematuria noted in nephrostomy bag, but no bleeding through the urethra.  He denies significant pain to his leg.  Discussed case with hematology with recommendations for switching over to full dose Lovenox for now and discontinuing heparin.  Monitor a.m. labs and consider discharge on Lovenox injections back to SNF by tomorrow if stable.  1/17: Patient continues to have hematuria noted in nephrostomy bag with slightly downward trend noted in hemoglobin and hematocrit levels.  Discussed with hematology today with recommendations to continue on full dose Lovenox for now and monitor hemoglobin in a.m.  If considerable downward trend noted may need transfer to [facility] for further evaluation by vascular surgery, otherwise may consider discharge with Lovenox injections and close monitoring.  Renal ultrasound today for further  evaluation.  Assessment & Plan:   Principal Problem:   Right leg DVT (HCC) Active Problems:   Essential hypertension   Chronic kidney disease, stage IV (severe) (HCC)   Hemiparesis affecting right side as late effect of cerebrovascular accident [facility])   Retroperitoneal fibrosis   Gross hematuria   DVT (deep venous thrombosis) (HCC)   Right lower extremity DVT with complication of penile bleeding/hematuria -Ongoing hematuria noted in nephrostomy bag -Continue full dose Lovenox and monitor CBC in a.m. -May consider discharge on full dose Lovenox injections prior to discharge -Renal ultrasound for nephrostomy tube evaluation as well as anatomical evaluation of kidneys on account of ongoing hematuria  Retroperitoneal fibrosis status post nephrostomy tube placement -Continue CellCept  CKD stage IIIb-stable -Continue close monitoring  History of CVA -Continue statin and hold aspirin for now  DVT prophylaxis:Full dose Lovenox Code Status:Full Family Communication:Spoke with sister on phone 1/17 Disposition Plan:Continue now on full dose Lovenox and monitor another 24 hours with repeat CBC.  Possible discharge back to SNF tomorrow if stable on Lovenox, otherwise considering transfer to [facility] for vascular surgery evaluation and possible IVC filter   Consultants:  Discussed with Dr. Delton Coombes  Procedures:  None  Antimicrobials:   None  Subjective: Patient seen and evaluated today with no new acute complaints or concerns. No acute concerns or events noted overnight.  He has some persistent hematuria in his nephrostomy bag noted this morning.  Objective: Vitals:   12/20/19 1342 12/20/19 2009 12/20/19 2148 12/21/19 0516  BP: 124/80  (!) 118/91 121/86  Pulse: 83  82 86  Resp: 19  18 17   Temp: 97.8 F (36.6 C)  98.1 F (36.7 C) 97.8 F (36.6 C)  TempSrc: Oral  SpO2: 98% 98% 100% 95%  Weight:      Height:        Intake/Output Summary (Last 24  hours) at 12/21/2019 1003 Last data filed at 12/21/2019 0942 Gross per 24 hour  Intake 1120 ml  Output 2600 ml  Net -1480 ml   Filed Weights   12/18/19 1635 12/19/19 0030  Weight: 113.4 kg 112.9 kg    Examination:  General exam: Appears calm and comfortable  Respiratory system: Clear to auscultation. Respiratory effort normal. Cardiovascular system: S1 & S2 heard, RRR. No JVD, murmurs, rubs, gallops or clicks. No pedal edema. Gastrointestinal system: Abdomen is nondistended, soft and nontender. No organomegaly or masses felt. Normal bowel sounds heard.  Nephrostomy bag with persistent hematuria noted. Central nervous system: Alert and oriented. No focal neurological deficits. Extremities: Symmetric 5 x 5 power. Skin: No rashes, lesions or ulcers Psychiatry: Judgement and insight appear normal. Mood & affect appropriate.     Data Reviewed: I have personally reviewed following labs and imaging studies  CBC: Recent Labs  Lab 12/18/19 1715 12/19/19 0542 12/20/19 0845 12/21/19 0713  WBC 6.8 6.4 6.3 5.9  NEUTROABS 4.7  --   --   --   HGB 14.2 13.5 13.6 13.0  HCT 46.2 43.7 44.1 42.1  MCV 89.4 87.8 88.4 87.7  PLT 180 189 175 [id number]   Basic Metabolic Panel: Recent Labs  Lab 12/18/19 1715 12/19/19 0542 12/20/19 0845 12/21/19 0713  NA 138 138 138 137  K 4.4 4.7 4.3 4.3  CL 104 108 105 106  CO2 25 21* 23 24  GLUCOSE 87 84 120* 119*  BUN 37* 36* 33* 33*  CREATININE 2.48* 2.42* 2.45* 2.37*  CALCIUM 8.9 8.5* 8.8* 8.7*   GFR: Estimated Creatinine Clearance: 43.6 mL/min (A) (by C-G formula based on SCr of 2.37 mg/dL (H)). Liver Function Tests: Recent Labs  Lab 12/18/19 1715  AST 29  ALT 35  ALKPHOS 86  BILITOT 0.5  PROT 8.4*  ALBUMIN 3.8   No results for input(s): LIPASE, AMYLASE in the last 168 hours. No results for input(s): AMMONIA in the last 168 hours. Coagulation Profile: Recent Labs  Lab 12/18/19 1715  INR 1.1   Cardiac Enzymes: No results for input(s):  CKTOTAL, CKMB, CKMBINDEX, TROPONINI in the last 168 hours. BNP (last 3 results) No results for input(s): PROBNP in the last 8760 hours. HbA1C: No results for input(s): HGBA1C in the last 72 hours. CBG: No results for input(s): GLUCAP in the last 168 hours. Lipid Profile: No results for input(s): CHOL, HDL, LDLCALC, TRIG, CHOLHDL, LDLDIRECT in the last 72 hours. Thyroid Function Tests: No results for input(s): TSH, T4TOTAL, FREET4, T3FREE, THYROIDAB in the last 72 hours. Anemia Panel: No results for input(s): VITAMINB12, FOLATE, FERRITIN, TIBC, IRON, RETICCTPCT in the last 72 hours. Sepsis Labs: No results for input(s): PROCALCITON, LATICACIDVEN in the last 168 hours.  Recent Results (from the past 240 hour(s))  Respiratory Panel by RT PCR (Flu A&B, Covid) - Nasopharyngeal Swab     Status: None   Collection Time: 12/18/19  7:07 PM   Specimen: Nasopharyngeal Swab  Result Value Ref Range Status   SARS Coronavirus 2 by RT PCR NEGATIVE NEGATIVE Final    Comment: (NOTE) SARS-CoV-2 target nucleic acids are NOT DETECTED. The SARS-CoV-2 RNA is generally detectable in upper respiratoy specimens during the acute phase of infection. The lowest concentration of SARS-CoV-2 viral copies this assay can detect is 131 copies/mL. A negative result does not preclude SARS-Cov-2 infection  and should not be used as the sole basis for treatment or other patient management decisions. A negative result may occur with  improper specimen collection/handling, submission of specimen other than nasopharyngeal swab, presence of viral mutation(s) within the areas targeted by this assay, and inadequate number of viral copies (<131 copies/mL). A negative result must be combined with clinical observations, patient history, and epidemiological information. The expected result is Negative. Fact Sheet for Patients:  PinkCheek.be Fact Sheet for Healthcare Providers:   GravelBags.it This test is not yet ap proved or cleared by the [place] FDA and  has been authorized for detection and/or diagnosis of SARS-CoV-2 by FDA under an Emergency Use Authorization (EUA). This EUA will remain  in effect (meaning this test can be used) for the duration of the COVID-19 declaration under Section 564(b)(1) of the Act, 21 U.S.C. section 360bbb-3(b)(1), unless the authorization is terminated or revoked sooner.    Influenza A by PCR NEGATIVE NEGATIVE Final   Influenza B by PCR NEGATIVE NEGATIVE Final    Comment: (NOTE) The Xpert Xpress SARS-CoV-2/FLU/RSV assay is intended as an aid in  the diagnosis of influenza from Nasopharyngeal swab specimens and  should not be used as a sole basis for treatment. Nasal washings and  aspirates are unacceptable for Xpert Xpress SARS-CoV-2/FLU/RSV  testing. Fact Sheet for Patients: PinkCheek.be Fact Sheet for Healthcare Providers: GravelBags.it This test is not yet approved or cleared by the [place] FDA and  has been authorized for detection and/or diagnosis of SARS-CoV-2 by  FDA under an Emergency Use Authorization (EUA). This EUA will remain  in effect (meaning this test can be used) for the duration of the  Covid-19 declaration under Section 564(b)(1) of the Act, 21  U.S.C. section 360bbb-3(b)(1), unless the authorization is  terminated or revoked. Performed at [facility], [address], [place], [place] [address]   MRSA PCR Screening     Status: Abnormal   Collection Time: 12/19/19 12:11 AM   Specimen: Nasal Mucosa; Nasopharyngeal  Result Value Ref Range Status   MRSA by PCR POSITIVE (A) NEGATIVE Final    Comment:        The GeneXpert MRSA Assay (FDA approved for NASAL specimens only), is one component of a comprehensive MRSA colonization surveillance program. It is not intended to diagnose MRSA infection nor to guide  or monitor treatment for MRSA infections. RESULT CALLED TO, READ BACK BY AND VERIFIED WITH: Hopping S. AT [id number] ON RN:[id number] BY THOMPSON S. Performed at [facility], [address] [place], [place] [address]          Radiology Studies: No results found.      Scheduled Meds: . calcium carbonate  1 tablet Oral BID WC  . Chlorhexidine Gluconate Cloth  6 each Topical Q0600  . enoxaparin (LOVENOX) injection  110 mg Subcutaneous Q12H  . furosemide  20 mg Oral Daily  . guaiFENesin  600 mg Oral BID  . mupirocin ointment  1 application Nasal BID  . mycophenolate  500 mg Oral BID  . senna  1 tablet Oral Daily  . simvastatin  40 mg Oral Daily  . sodium bicarbonate  650 mg Oral TID  . sodium chloride flush  3 mL Intravenous Q12H  . sodium chloride flush  3 mL Intravenous Q12H  . tamsulosin  0.4 mg Oral QHS   Continuous Infusions: . sodium chloride       LOS: 2 days    Time spent: 30 minutes    Dallis Czaja  Darleen Crocker, DO [facility] Pager [phone]  If 7PM-7AM, please contact night-coverage [email or web address] Password [email or web address] 12/21/2019, 10:03 AM

## 2019-12-22 LAB — CBC
HCT: 43.9 % (ref 39.0–52.0)
Hemoglobin: 13.6 g/dL (ref 13.0–17.0)
MCH: 27 pg (ref 26.0–34.0)
MCHC: 31 g/dL (ref 30.0–36.0)
MCV: 87.3 fL (ref 80.0–100.0)
Platelets: 182 10*3/uL (ref 150–400)
RBC: 5.03 MIL/uL (ref 4.22–5.81)
RDW: 15.7 % — ABNORMAL HIGH (ref 11.5–15.5)
WBC: 5.9 10*3/uL (ref 4.0–10.5)
nRBC: 0 % (ref 0.0–0.2)

## 2019-12-22 LAB — BASIC METABOLIC PANEL
Anion gap: 9 (ref 5–15)
BUN: 32 mg/dL — ABNORMAL HIGH (ref 6–20)
CO2: 24 mmol/L (ref 22–32)
Calcium: 8.9 mg/dL (ref 8.9–10.3)
Chloride: 106 mmol/L (ref 98–111)
Creatinine, Ser: 2.4 mg/dL — ABNORMAL HIGH (ref 0.61–1.24)
GFR calc Af Amer: 33 mL/min — ABNORMAL LOW (ref >60)
GFR calc non Af Amer: 28 mL/min — ABNORMAL LOW (ref >60)
Glucose, Bld: 83 mg/dL (ref 70–99)
Potassium: 4.5 mmol/L (ref 3.5–5.1)
Sodium: 139 mmol/L (ref 135–145)

## 2019-12-22 MED ORDER — ENOXAPARIN SODIUM 120 MG/0.8ML ~~LOC~~ SOLN: 110.0000 mg | Freq: Two times a day (BID) | SUBCUTANEOUS | 2 refills | Status: DC

## 2019-12-22 NOTE — TOC Transition Note (Signed)
Transition of Care [facility]) - CM/SW Discharge Note   Patient Details  Name: Brendan [name] MRN: [id number] Date of Birth: 11/01/1961  Transition of Care [facility]) CM/SW Contact:  Boneta Lucks, RN Phone Number: 12/22/2019, 10:31 AM   Clinical Narrative:   Patient medically ready to return to [facility], Confirmed with Lynnae Sandhoff, they are ready patient will go to Room 604B. RN updated with number for report. Clinicals sent in Hub.    Final next level of care: Long Term Nursing Home Barriers to Discharge: Barriers Resolved   Patient Goals and CMS Choice Patient states their goals for this hospitalization and ongoing recovery are:: to return to [facility] [employer].gov Compare Post Acute Care list provided to:: Patient    Discharge Placement              Patient chooses bed at: [facility]     Patient and family notified of of transfer: 12/22/19

## 2019-12-22 NOTE — Progress Notes (Addendum)
Report called to [facility]. Patient transferred from unit at 1155 via stretcher accompanied by EMS. Patient in stable condition

## 2019-12-22 NOTE — Discharge Summary (Signed)
Physician Discharge Summary  Brendan [name] [id number] DOB: 1960-12-05 DOA: 12/18/2019  PCP: Cleda Mccreedy, MD  Admit date: 12/18/2019  Discharge date: 12/22/2019  Admitted From:SNF  Disposition:  SNF  Recommendations for Outpatient Follow-up:  1. Follow up with PCP in 1-2 weeks 2. Please obtain BMP/CBC in one week 3. Please follow-up with vascular surgery as previously scheduled 4. Hold any further use of Xarelto and remain on full dose Lovenox as prescribed for treatment of DVT 5. Follow-up with urology for persistent hematuria noted in nephrostomy bag.  Renal ultrasound with moderate hydronephrosis without signs of obstruction and renal function has remained stable.  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 Patient reports that his Xarelto had been stopped due to gross hematuria, hematuria cleared, but he developed progressive swelling in the right leg with recent ultrasound demonstrating RLE DVT. Patient denies leg pain, denies cough, denies shortness of breath, denies chest pain, and denies fevers or chills.  1/15: Patient states that he has had some hematuria this morning, but no significant signs of hematuria noted in his urinal at bedside. He denies any significant pain to his leg, but continues to have swelling on the right side.  1/16:Patient continues to have hematuria noted in nephrostomy bag, but no bleeding through the urethra. He denies significant pain to his leg. Discussed  case with hematology with recommendations for switching over to full dose Lovenox for now and discontinuing heparin. Monitor a.m. labs and consider discharge on Lovenox injections back to SNF by tomorrow if stable.  1/17: Patient continues to have hematuria noted in nephrostomy bag with slightly downward trend noted in hemoglobin and hematocrit levels.  Discussed with hematology today with recommendations to continue on full dose Lovenox for now and monitor hemoglobin in a.m.  If considerable downward trend noted may need transfer to [facility] for further evaluation by vascular surgery, otherwise may consider discharge with Lovenox injections and close monitoring.  Renal ultrasound today for further evaluation.  1/18: Patient has stable hemoglobin levels noted to be at 13.6 this morning.  He continues to have persistent hematuria and nephrostomy bag, however.  He appears to be tolerating Lovenox injections well and will remain on this as opposed to Xarelto on discharge.  He will need outpatient follow-up with vascular surgery for his extensive DVT to the right lower extremity as well as follow-up with urology given persistent hematuria in the nephrostomy bag.  Renal ultrasound demonstrates adequate placement of the nephrostomy tube without any significant findings aside from some moderate hydronephrosis.  Renal function has remained stable.  Discharge Diagnoses:  Principal Problem:   Right leg DVT ([place]) Active Problems:   Essential hypertension   Chronic kidney disease, stage IV (severe) (HCC)   Hemiparesis affecting right side as late effect of cerebrovascular accident [facility])   Retroperitoneal fibrosis   Gross hematuria   DVT (deep venous thrombosis) ([place])  Principal discharge diagnosis: Persistent hematuria nephrostomy bag in the setting of anticoagulation for right lower extremity DVT.  Discharge Instructions  Discharge Instructions    Diet - low sodium heart healthy   Complete by: As  directed    Increase activity slowly   Complete by: As directed      Allergies as of 12/22/2019      Reactions   Other    TB serum-reaction unknown. Provided via [facility] records      Medication List    TAKE these medications   acetaminophen 325 MG tablet Commonly known as: TYLENOL Take 650 mg by mouth every 8 (eight) hours as needed for mild pain or moderate pain.   aspirin EC 81 MG tablet Take 81 mg by mouth daily.   Caltrate 600 1500 (600 Ca) MG Tabs tablet Generic drug: calcium carbonate Take 1 tablet by mouth 2 (two) times daily.   cetaphil lotion Apply 1 application topically at bedtime. Applied to feet/ankles   Cholecalciferol 25 MCG (1000 UT) tablet Take 2,000 Units by mouth daily.   enoxaparin 120 MG/0.8ML injection Commonly known as: LOVENOX Inject 0.73 mLs (110 mg total) into the skin every 12 (twelve) hours.   ezetimibe 10 MG tablet Commonly known as: ZETIA Take 10 mg by mouth daily.   furosemide 20 MG tablet Commonly known as: LASIX Take 20 mg by mouth daily.   guaiFENesin 600 MG 12 hr tablet Commonly known as: MUCINEX Take 600 mg by mouth 2 (two) times daily.   loratadine 10 MG tablet Commonly known as: CLARITIN Take 10 mg by mouth daily.   mycophenolate 500 MG tablet Commonly known as: CELLCEPT Take 500 mg by mouth 2 (two) times daily.   senna 8.6 MG Tabs tablet Commonly known as: SENOKOT Take 1 tablet by mouth daily.   simvastatin 40 MG tablet Commonly known as: ZOCOR Take 40 mg by mouth at bedtime.   sodium bicarbonate 650 MG tablet Take 650 mg by mouth 3 (three) times daily.   tamsulosin 0.4 MG Caps capsule Commonly known as: FLOMAX Take 0.4 mg by mouth at bedtime.   zinc sulfate 220 (50 Zn) MG capsule Take 220 mg by mouth daily.       Contact information for follow-up providers    Cleda Mccreedy, MD Follow up in 1 week(s).   Specialty: Internal Medicine Contact information: [place] [address] [place] Follow up in 1 week(s).   Specialty: Urology Contact information: [phone]       Elam Dutch, MD Follow up in 2 week(s).   Specialties: Vascular Surgery, Cardiology Contact information: [address] [place] [address] [phone]            Contact information for after-discharge care    Destination    [facility] .   Service: Skilled Nursing Contact information: [place] [phone]                 Allergies  Allergen Reactions  . Other     TB serum-reaction unknown. Provided via [facility] records     Consultations:  Discussed case with Dr. Lesly Dukes   Procedures/Studies: US RENAL  Result Date: 12/21/2019 CLINICAL DATA:  Confirm left nephrostomy tube placement. EXAM: RENAL / URINARY TRACT ULTRASOUND COMPLETE COMPARISON:  August 31, 2018 FINDINGS: Right Kidney: Renal length: 6.8 cm. Cortical thinning and increased echogenicity noted. Left Kidney: Renal length: 14.8 cm. There is a moderate hydronephrosis. A nephrostomy tube is identified in the lower pole of the left kidney. Bladder: Near completely decompressed and therefore poorly evaluated. Other: None. IMPRESSION: Atrophic right kidney. Moderate left hydronephrosis  with nephrostomy tube tip in the lower pole. Electronically Signed   By: Fidela Salisbury M.D.   On: 12/21/2019 11:52   DG Chest Port 1 View  Result Date: 12/18/2019 CLINICAL DATA:  DVT EXAM: PORTABLE CHEST 1 VIEW COMPARISON:  03/26/2018 FINDINGS: Heart and mediastinal contours are within normal limits. No focal opacities or effusions. No acute bony abnormality. IMPRESSION: Negative. Electronically Signed   By: Rolm Baptise M.D.   On: 12/18/2019 17:43     Discharge Exam: Vitals:   12/21/19 2100 12/22/19 0500  BP: 129/87 118/85  Pulse: 76 83  Resp: 16 20  Temp: 97.8 F (36.6 C) 98.6 F (37 C)  SpO2: 99% 96%    Vitals:   12/20/19 2148 12/21/19 0516 12/21/19 2100 12/22/19 0500  BP: (!) 118/91 121/86 129/87 118/85  Pulse: 82 86 76 83  Resp: 18 17 16 20   Temp: 98.1 F (36.7 C) 97.8 F (36.6 C) 97.8 F (36.6 C) 98.6 F (37 C)  TempSrc:   Oral Oral  SpO2: 100% 95% 99% 96%  Weight:      Height:        General: Pt is alert, awake, not in acute distress Cardiovascular: RRR, S1/S2 +, no rubs, no gallops Respiratory: CTA bilaterally, no wheezing, no rhonchi Abdominal: Soft, NT, ND, bowel sounds + Extremities: no edema, no cyanosis Nephrostomy bag with persistent hematuria    The results of significant diagnostics from this hospitalization (including imaging, microbiology, ancillary and laboratory) are listed below for reference.     Microbiology: Recent Results (from the past 240 hour(s))  Respiratory Panel by RT PCR (Flu A&B, Covid) - Nasopharyngeal Swab     Status: None   Collection Time: 12/18/19  7:07 PM   Specimen: Nasopharyngeal Swab  Result Value Ref Range Status   SARS Coronavirus 2 by RT PCR NEGATIVE NEGATIVE Final    Comment: (NOTE) SARS-CoV-2 target nucleic acids are NOT DETECTED. The SARS-CoV-2 RNA is generally detectable in upper respiratoy specimens during the acute phase of infection. The lowest concentration of SARS-CoV-2 viral copies this assay can detect is 131 copies/mL. A negative result does not preclude SARS-Cov-2 infection and should not be used as the sole basis for treatment or other patient management decisions. A negative result may occur with  improper specimen collection/handling, submission of specimen other than nasopharyngeal swab, presence of viral mutation(s) within the areas targeted by this assay, and inadequate number of viral copies (<131 copies/mL). A negative result must be combined with clinical observations, patient history, and epidemiological information. The expected result is Negative. Fact Sheet for Patients:   PinkCheek.be Fact Sheet for Healthcare Providers:  GravelBags.it This test is not yet ap proved or cleared by the [place] FDA and  has been authorized for detection and/or diagnosis of SARS-CoV-2 by FDA under an Emergency Use Authorization (EUA). This EUA will remain  in effect (meaning this test can be used) for the duration of the COVID-19 declaration under Section 564(b)(1) of the Act, 21 U.S.C. section 360bbb-3(b)(1), unless the authorization is terminated or revoked sooner.    Influenza A by PCR NEGATIVE NEGATIVE Final   Influenza B by PCR NEGATIVE NEGATIVE Final    Comment: (NOTE) The Xpert Xpress SARS-CoV-2/FLU/RSV assay is intended as an aid in  the diagnosis of influenza from Nasopharyngeal swab specimens and  should not be used as a sole basis for treatment. Nasal washings and  aspirates are unacceptable for Xpert Xpress SARS-CoV-2/FLU/RSV  testing. Fact Sheet for Patients: PinkCheek.be Fact  Sheet for Healthcare Providers: GravelBags.it This test is not yet approved or cleared by the [place] and  has been authorized for detection and/or diagnosis of SARS-CoV-2 by  FDA under an Emergency Use Authorization (EUA). This EUA will remain  in effect (meaning this test can be used) for the duration of the  Covid-19 declaration under Section 564(b)(1) of the Act, 21  U.S.C. section 360bbb-3(b)(1), unless the authorization is  terminated or revoked. Performed at [facility], [address], [place], [place] [address]   MRSA PCR Screening     Status: Abnormal   Collection Time: 12/19/19 12:11 AM   Specimen: Nasal Mucosa; Nasopharyngeal  Result Value Ref Range Status   MRSA by PCR POSITIVE (A) NEGATIVE Final    Comment:        The GeneXpert MRSA Assay (FDA approved for NASAL specimens only), is one component of a comprehensive MRSA  colonization surveillance program. It is not intended to diagnose MRSA infection nor to guide or monitor treatment for MRSA infections. RESULT CALLED TO, READ BACK BY AND VERIFIED WITH: Wiltsey S. AT [id number] ON [id number] BY THOMPSON S. Performed at [facility], [address] [place], [place] [address]      Labs: BNP (last 3 results) No results for input(s): BNP in the last 8760 hours. Basic Metabolic Panel: Recent Labs  Lab 12/18/19 1715 12/19/19 0542 12/20/19 0845 12/21/19 0713 12/22/19 0627  NA 138 138 138 137 139  K 4.4 4.7 4.3 4.3 4.5  CL 104 108 105 106 106  CO2 25 21* 23 24 24   GLUCOSE 87 84 120* 119* 83  BUN 37* 36* 33* 33* 32*  CREATININE 2.48* 2.42* 2.45* 2.37* 2.40*  CALCIUM 8.9 8.5* 8.8* 8.7* 8.9   Liver Function Tests: Recent Labs  Lab 12/18/19 1715  AST 29  ALT 35  ALKPHOS 86  BILITOT 0.5  PROT 8.4*  ALBUMIN 3.8   No results for input(s): LIPASE, AMYLASE in the last 168 hours. No results for input(s): AMMONIA in the last 168 hours. CBC: Recent Labs  Lab 12/18/19 1715 12/19/19 0542 12/20/19 0845 12/21/19 0713 12/22/19 0627  WBC 6.8 6.4 6.3 5.9 5.9  NEUTROABS 4.7  --   --   --   --   HGB 14.2 13.5 13.6 13.0 13.6  HCT 46.2 43.7 44.1 42.1 43.9  MCV 89.4 87.8 88.4 87.7 87.3  PLT 180 189 175 182 182   Cardiac Enzymes: No results for input(s): CKTOTAL, CKMB, CKMBINDEX, TROPONINI in the last 168 hours. BNP: Invalid input(s): POCBNP CBG: No results for input(s): GLUCAP in the last 168 hours. D-Dimer No results for input(s): DDIMER in the last 72 hours. Hgb A1c No results for input(s): HGBA1C in the last 72 hours. Lipid Profile No results for input(s): CHOL, HDL, LDLCALC, TRIG, CHOLHDL, LDLDIRECT in the last 72 hours. Thyroid function studies No results for input(s): TSH, T4TOTAL, T3FREE, THYROIDAB in the last 72 hours.  Invalid input(s): FREET3 Anemia work up No results for input(s): VITAMINB12, FOLATE, FERRITIN, TIBC, IRON, RETICCTPCT in  the last 72 hours. Urinalysis    Component Value Date/Time   COLORURINE YELLOW 08/31/2018 2132   APPEARANCEUR CLOUDY (A) 08/31/2018 2132   LABSPEC 1.010 08/31/2018 2132   PHURINE 8.0 08/31/2018 2132   GLUCOSEU NEGATIVE 08/31/2018 2132   HGBUR MODERATE (A) 08/31/2018 2132   BILIRUBINUR NEGATIVE 08/31/2018 2132   [place] NEGATIVE 08/31/2018 2132   PROTEINUR 100 (A) 08/31/2018 2132   NITRITE NEGATIVE 08/31/2018 2132   LEUKOCYTESUR  LARGE (A) 08/31/2018 2132   Sepsis Labs Invalid input(s): PROCALCITONIN,  WBC,  LACTICIDVEN Microbiology Recent Results (from the past 240 hour(s))  Respiratory Panel by RT PCR (Flu A&B, Covid) - Nasopharyngeal Swab     Status: None   Collection Time: 12/18/19  7:07 PM   Specimen: Nasopharyngeal Swab  Result Value Ref Range Status   SARS Coronavirus 2 by RT PCR NEGATIVE NEGATIVE Final    Comment: (NOTE) SARS-CoV-2 target nucleic acids are NOT DETECTED. The SARS-CoV-2 RNA is generally detectable in upper respiratoy specimens during the acute phase of infection. The lowest concentration of SARS-CoV-2 viral copies this assay can detect is 131 copies/mL. A negative result does not preclude SARS-Cov-2 infection and should not be used as the sole basis for treatment or other patient management decisions. A negative result may occur with  improper specimen collection/handling, submission of specimen other than nasopharyngeal swab, presence of viral mutation(s) within the areas targeted by this assay, and inadequate number of viral copies (<131 copies/mL). A negative result must be combined with clinical observations, patient history, and epidemiological information. The expected result is Negative. Fact Sheet for Patients:  PinkCheek.be Fact Sheet for Healthcare Providers:  GravelBags.it This test is not yet ap proved or cleared by the [place] FDA and  has been authorized for detection and/or  diagnosis of SARS-CoV-2 by FDA under an Emergency Use Authorization (EUA). This EUA will remain  in effect (meaning this test can be used) for the duration of the COVID-19 declaration under Section 564(b)(1) of the Act, 21 U.S.C. section 360bbb-3(b)(1), unless the authorization is terminated or revoked sooner.    Influenza A by PCR NEGATIVE NEGATIVE Final   Influenza B by PCR NEGATIVE NEGATIVE Final    Comment: (NOTE) The Xpert Xpress SARS-CoV-2/FLU/RSV assay is intended as an aid in  the diagnosis of influenza from Nasopharyngeal swab specimens and  should not be used as a sole basis for treatment. Nasal washings and  aspirates are unacceptable for Xpert Xpress SARS-CoV-2/FLU/RSV  testing. Fact Sheet for Patients: PinkCheek.be Fact Sheet for Healthcare Providers: GravelBags.it This test is not yet approved or cleared by the [place] FDA and  has been authorized for detection and/or diagnosis of SARS-CoV-2 by  FDA under an Emergency Use Authorization (EUA). This EUA will remain  in effect (meaning this test can be used) for the duration of the  Covid-19 declaration under Section 564(b)(1) of the Act, 21  U.S.C. section 360bbb-3(b)(1), unless the authorization is  terminated or revoked. Performed at [facility], [address], [place], [place] [address]   MRSA PCR Screening     Status: Abnormal   Collection Time: 12/19/19 12:11 AM   Specimen: Nasal Mucosa; Nasopharyngeal  Result Value Ref Range Status   MRSA by PCR POSITIVE (A) NEGATIVE Final    Comment:        The GeneXpert MRSA Assay (FDA approved for NASAL specimens only), is one component of a comprehensive MRSA colonization surveillance program. It is not intended to diagnose MRSA infection nor to guide or monitor treatment for MRSA infections. RESULT CALLED TO, READ BACK BY AND VERIFIED WITH: Buffa S. AT [id number]:[id number] ON [id number] BY THOMPSON S. Performed at [facility], [address] [place], [place] [address]      Time coordinating discharge: 35 minutes  SIGNED:   Rodena Goldmann, DO [facility] 12/22/2019, 8:50 AM  If 7PM-7AM, please contact night-coverage [email or web address]

## 2019-12-31 ENCOUNTER — Telehealth: Payer: Self-pay | Admitting: Urology

## 2019-12-31 NOTE — Telephone Encounter (Signed)
I sent Dr. Diona Fanti a message to review. I see he has an appt on 2/23 for [place]. Awaiting his response

## 2019-12-31 NOTE — Telephone Encounter (Signed)
Patient was scheduled for 01/06/20 at 1 because he was seen at ER for persistant hematuria and we were trying to squeeze him in. Arbie Cookey is stating he has  a vasular appt that he cannot miss on 01/06/20 and she states he doesn't need to wait until March for appt. Please advise.

## 2020-01-02 NOTE — Telephone Encounter (Signed)
-----   Message from Franchot Gallo, MD sent at 01/02/2020  6:57 AM EST ----- It seems to be draining OK. Keep scheduled appt.  ----- Message ----- From: Dorisann Frames, RN Sent: 12/31/2019   9:36 AM EST To: Franchot Gallo, MD  I suggested scheduled pt for next Tuesday to be seen. Pt has vascular appt he can not miss. We scheduled for 2/23 due to schedule availability. Can you please review and see if that is appropriate? DC note states he has hematuria noted in nephrostomy bag.

## 2020-01-05 ENCOUNTER — Other Ambulatory Visit: Payer: Self-pay

## 2020-01-05 ENCOUNTER — Ambulatory Visit (HOSPITAL_COMMUNITY)
Admission: RE | Admit: 2020-01-05 | Discharge: 2020-01-05 | Disposition: A | Discharge status: Home / Self Care | Payer: Medicaid Other | Source: Ambulatory Visit | Attending: Surgery | Admitting: Surgery

## 2020-01-05 DIAGNOSIS — I824Y1 Acute embolism and thrombosis of unspecified deep veins of right proximal lower extremity: Secondary | ICD-10-CM

## 2020-01-06 ENCOUNTER — Other Ambulatory Visit: Payer: Self-pay

## 2020-01-06 ENCOUNTER — Telehealth: Payer: Self-pay

## 2020-01-06 ENCOUNTER — Ambulatory Visit (INDEPENDENT_AMBULATORY_CARE_PROVIDER_SITE_OTHER): Payer: Medicaid Other | Admitting: Vascular Surgery

## 2020-01-06 ENCOUNTER — Encounter: Payer: Self-pay | Admitting: Vascular Surgery

## 2020-01-06 ENCOUNTER — Ambulatory Visit: Payer: Medicaid Other | Admitting: Urology

## 2020-01-06 VITALS — BP 130/76 | HR 78 | Temp 97.6°F | Resp 20 | Ht 73.0 in | Wt 248.0 lb

## 2020-01-06 DIAGNOSIS — I824Y1 Acute embolism and thrombosis of unspecified deep veins of right proximal lower extremity: Secondary | ICD-10-CM | POA: Diagnosis not present

## 2020-01-06 NOTE — Progress Notes (Signed)
Vascular and Vein Specialist of [facility]  [name] MRN: [id number] DOB: 12-04-1961 Sex: male  REASON FOR CONSULT: Evaluation for DVT and gross hematuria  HPI: Brendan [name] is a 59 y.o. male, who is here today for evaluation of DVT and gross hematuria.  He has had a prior stroke.  He is able to answer questions.  He is in a nursing facility in [facility].  He reports that he is originally from [place].  I asked him what brought him here and he said trouble.  Reports that he has been in prison for 14 years.  Jorde of his information is obtained from his chart.  He was recently admitted to [facility] on 12/18/2019.  Had diagnosis of right leg DVT.  His treatment was complicated by gross hematuria.  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  His Xarelto was canceled and he was started on Lovenox injections and discharged back to his nursing facility.  He is continuing to have difficulty with ongoing hematuria.  His leg swelling specifically has improved.  Past Medical History:  Diagnosis Date  . Acidosis   . Acute unilateral obstructive uropathy   . Allergic rhinitis   . Chronic kidney disease, stage IV (severe) ([place])   . Constipation   . Coronary atherosclerosis of native coronary artery   . Diabetes mellitus without complication ([place])   . DVT (deep venous thrombosis) ([place])   . Enlarged prostate with urinary obstruction   . Essential hypertension, malignant   . GERD (gastroesophageal reflux disease)   . Glaucoma   . Hemiparesis affecting right side as late effect of cerebrovascular accident ([place])   . Hemiplegia ([place])   . Hydronephrosis   . Hyperlipidemia   . Hypertension   . Hypokalemia   . Muscle weakness (generalized)   . PVD (peripheral vascular disease) ([place])   . Retroperitoneal fibrosis   . Stricture or kinking of ureter   . Stroke [facility])    right sided hemiparesis  . Vitamin D deficiency     Family History  Family history unknown: Yes    SOCIAL HISTORY: Social History   Socioeconomic History  . Marital status: Single    Spouse name: Not on file  . Number of children: Not on file  . Years of education: Not on file  . Highest education level: Not on file  Occupational History  . Not on file  Tobacco Use  . Smoking status: Never Smoker  . Smokeless tobacco: Never Used  Substance and Sexual Activity  . Alcohol use: Not Currently  . Drug use: Not Currently  . Sexual activity: Not Currently  Other Topics Concern  . Not on file  Social History Narrative  . Not on file   Social Determinants of Health   Financial Resource Strain:   . Difficulty of Paying Living Expenses: Not on file  Food Insecurity:   . Worried About Charity fundraiser in the Last Year: Not on file  . Ran Out of Food in the Last Year: Not on file  Transportation Needs:   . Lack of Transportation (Medical): Not on file  . Lack of Transportation (Non-Medical): Not on file  Physical Activity:   . Days of Exercise per Week: Not on file  . Minutes of Exercise per Session: Not on file  Stress:   . Feeling of Stress : Not on file  Social Connections:   .  Frequency of Communication with Friends and Family: Not on file  . Frequency of Social Gatherings with Friends and Family: Not on file  . Attends Religious Services: Not on file  . Active Member of Clubs or Organizations: Not on file  . Attends Archivist Meetings: Not on file  . Marital Status: Not on file  Intimate Partner Violence:   . Fear of Current or Ex-Partner: Not on file  . Emotionally Abused: Not on file  . Physically Abused: Not on file  . Sexually Abused: Not on file    Allergies  Allergen Reactions  . Other     TB serum-reaction unknown. Provided via [facility] records     Current Outpatient Medications  Medication Sig Dispense Refill  . acetaminophen (TYLENOL) 325  MG tablet Take 650 mg by mouth every 8 (eight) hours as needed for mild pain or moderate pain.     Marland Kitchen aspirin EC 81 MG tablet Take 81 mg by mouth daily.    . calcium carbonate (CALTRATE 600) 1500 (600 Ca) MG TABS tablet Take 1 tablet by mouth 2 (two) times daily.    . cetaphil (CETAPHIL) lotion Apply 1 application topically at bedtime. Applied to feet/ankles    . Cholecalciferol 1000 units tablet Take 2,000 Units by mouth daily.     Marland Kitchen enoxaparin (LOVENOX) 120 MG/0.8ML injection Inject 0.73 mLs (110 mg total) into the skin every 12 (twelve) hours. 43.8 mL 2  . ezetimibe (ZETIA) 10 MG tablet Take 10 mg by mouth daily.    . furosemide (LASIX) 20 MG tablet Take 20 mg by mouth daily.    Marland Kitchen guaiFENesin (MUCINEX) 600 MG 12 hr tablet Take 600 mg by mouth 2 (two) times daily.    Marland Kitchen loratadine (CLARITIN) 10 MG tablet Take 10 mg by mouth daily.    . mycophenolate (CELLCEPT) 500 MG tablet Take 500 mg by mouth 2 (two) times daily.    Marland Kitchen senna (SENOKOT) 8.6 MG TABS tablet Take 1 tablet by mouth daily.     . simvastatin (ZOCOR) 40 MG tablet Take 40 mg by mouth at bedtime.    . sodium bicarbonate 650 MG tablet Take 650 mg by mouth 3 (three) times daily.    . tamsulosin (FLOMAX) 0.4 MG CAPS capsule Take 0.4 mg by mouth at bedtime.    Marland Kitchen zinc sulfate 220 (50 Zn) MG capsule Take 220 mg by mouth daily.     No current facility-administered medications for this visit.    REVIEW OF SYSTEMS:  [X]  denotes positive finding, [ ]  denotes negative finding Cardiac  Comments:  Chest pain or chest pressure:    Shortness of breath upon exertion:    Short of breath when lying flat:    Irregular heart rhythm:        Vascular    Pain in calf, thigh, or hip brought on by ambulation:    Pain in feet at night that wakes you up from your sleep:     Blood clot in your veins: x   Leg swelling:  x       Pulmonary    Oxygen at home:    Productive cough:     Wheezing:         Neurologic    Sudden weakness in arms or legs:  x    Sudden numbness in arms or legs:  x   Sudden onset of difficulty speaking or slurred speech:    Temporary loss of vision in one eye:  Problems with dizziness:         Gastrointestinal    Blood in stool:     Vomited blood:         Genitourinary    Burning when urinating:     Blood in urine: x       Psychiatric    Major depression:         Hematologic    Bleeding problems:    Problems with blood clotting too easily:        Skin    Rashes or ulcers:        Constitutional    Fever or chills:      PHYSICAL EXAM: Vitals:   01/06/20 1334  BP: 130/76  Pulse: 78  Resp: 20  Temp: 97.6 F (36.4 C)  SpO2: 97%  Weight: 248 lb (112.5 kg)  Height: 6\' 1"  (1.854 m)    GENERAL: The patient is a well-nourished male, in no acute distress. The vital signs are documented above. CARDIOVASCULAR: 2+ radial pulses bilaterally.  Moderate swelling in both lower extremities.  I do not palpate pulses PULMONARY: There is good air exchange  ABDOMEN: Soft and non-tender  MUSCULOSKELETAL: There are no major deformities or cyanosis. NEUROLOGIC: No focal weakness or paresthesias are detected. SKIN: There are no ulcers or rashes noted. PSYCHIATRIC: The patient has a normal affect.  DATA:  Duplex right leg from yesterday reveals right external iliac vein and common femoral DVT, age-indeterminate  MEDICAL ISSUES: Had long discussion with the patient.  I am concerned regarding his ongoing gross hematuria despite attempts at Lovenox injection.  He understandably does not want to continue lifelong Lovenox injections.  Did discuss the option of vena cava filter.  Explained that this would be very active at reducing his risk for pulmonary embolus.  Explained that it would not prevent additional future DVT or improve resolution of the current DVT.  I feel that this is his best treatment option given his ongoing gross hematuria.  We will coordinate this as an outpatient at his earliest  [place]. Coline Calkin, MD [facility] Vascular and Vein Specialists of [facility] Tel [phone] Pager [phone]

## 2020-01-06 NOTE — Telephone Encounter (Signed)
Arbie Cookey at [facility] notified of appt okay to keep.

## 2020-01-06 NOTE — H&P (View-Only) (Signed)
Vascular and Vein Specialist of [facility]  [name] MRN: [id number] DOB: [birth year] Sex: male  REASON FOR CONSULT: Evaluation for DVT and gross hematuria  HPI: Brendan [name] is a 59 y.o. male, who is here today for evaluation of DVT and gross hematuria.  He has had a prior stroke.  He is able to answer questions.  He is in a nursing facility in [facility].  He reports that he is originally from [place].  I asked him what brought him here and he said trouble.  Reports that he has been in prison for 14 years.  Jorde of his information is obtained from his chart.  He was recently admitted to [facility] on 12/18/2019.  Had diagnosis of right leg DVT.  His treatment was complicated by gross hematuria.  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  His Xarelto was canceled and he was started on Lovenox injections and discharged back to his nursing facility.  He is continuing to have difficulty with ongoing hematuria.  His leg swelling specifically has improved.  Past Medical History:  Diagnosis Date  . Acidosis   . Acute unilateral obstructive uropathy   . Allergic rhinitis   . Chronic kidney disease, stage IV (severe) ([place])   . Constipation   . Coronary atherosclerosis of native coronary artery   . Diabetes mellitus without complication ([place])   . DVT (deep venous thrombosis) ([place])   . Enlarged prostate with urinary obstruction   . Essential hypertension, malignant   . GERD (gastroesophageal reflux disease)   . Glaucoma   . Hemiparesis affecting right side as late effect of cerebrovascular accident ([place])   . Hemiplegia ([place])   . Hydronephrosis   . Hyperlipidemia   . Hypertension   . Hypokalemia   . Muscle weakness (generalized)   . PVD (peripheral vascular disease) ([place])   . Retroperitoneal fibrosis   . Stricture or kinking of ureter   . Stroke [facility])    right sided hemiparesis  . Vitamin D deficiency     Family History  Family history unknown: Yes    SOCIAL HISTORY: Social History   Socioeconomic History  . Marital status: Single    Spouse name: Not on file  . Number of children: Not on file  . Years of education: Not on file  . Highest education level: Not on file  Occupational History  . Not on file  Tobacco Use  . Smoking status: Never Smoker  . Smokeless tobacco: Never Used  Substance and Sexual Activity  . Alcohol use: Not Currently  . Drug use: Not Currently  . Sexual activity: Not Currently  Other Topics Concern  . Not on file  Social History Narrative  . Not on file   Social Determinants of Health   Financial Resource Strain:   . Difficulty of Paying Living Expenses: Not on file  Food Insecurity:   . Worried About Charity fundraiser in the Last Year: Not on file  . Ran Out of Food in the Last Year: Not on file  Transportation Needs:   . Lack of Transportation (Medical): Not on file  . Lack of Transportation (Non-Medical): Not on file  Physical Activity:   . Days of Exercise per Week: Not on file  . Minutes of Exercise per Session: Not on file  Stress:   . Feeling of Stress : Not on file  Social Connections:   .  Frequency of Communication with Friends and Family: Not on file  . Frequency of Social Gatherings with Friends and Family: Not on file  . Attends Religious Services: Not on file  . Active Member of Clubs or Organizations: Not on file  . Attends Archivist Meetings: Not on file  . Marital Status: Not on file  Intimate Partner Violence:   . Fear of Current or Ex-Partner: Not on file  . Emotionally Abused: Not on file  . Physically Abused: Not on file  . Sexually Abused: Not on file    Allergies  Allergen Reactions  . Other     TB serum-reaction unknown. Provided via [facility] records     Current Outpatient Medications  Medication Sig Dispense Refill  . acetaminophen (TYLENOL) 325  MG tablet Take 650 mg by mouth every 8 (eight) hours as needed for mild pain or moderate pain.     Marland Kitchen aspirin EC 81 MG tablet Take 81 mg by mouth daily.    . calcium carbonate (CALTRATE 600) 1500 (600 Ca) MG TABS tablet Take 1 tablet by mouth 2 (two) times daily.    . cetaphil (CETAPHIL) lotion Apply 1 application topically at bedtime. Applied to feet/ankles    . Cholecalciferol 1000 units tablet Take 2,000 Units by mouth daily.     Marland Kitchen enoxaparin (LOVENOX) 120 MG/0.8ML injection Inject 0.73 mLs (110 mg total) into the skin every 12 (twelve) hours. 43.8 mL 2  . ezetimibe (ZETIA) 10 MG tablet Take 10 mg by mouth daily.    . furosemide (LASIX) 20 MG tablet Take 20 mg by mouth daily.    Marland Kitchen guaiFENesin (MUCINEX) 600 MG 12 hr tablet Take 600 mg by mouth 2 (two) times daily.    Marland Kitchen loratadine (CLARITIN) 10 MG tablet Take 10 mg by mouth daily.    . mycophenolate (CELLCEPT) 500 MG tablet Take 500 mg by mouth 2 (two) times daily.    Marland Kitchen senna (SENOKOT) 8.6 MG TABS tablet Take 1 tablet by mouth daily.     . simvastatin (ZOCOR) 40 MG tablet Take 40 mg by mouth at bedtime.    . sodium bicarbonate 650 MG tablet Take 650 mg by mouth 3 (three) times daily.    . tamsulosin (FLOMAX) 0.4 MG CAPS capsule Take 0.4 mg by mouth at bedtime.    Marland Kitchen zinc sulfate 220 (50 Zn) MG capsule Take 220 mg by mouth daily.     No current facility-administered medications for this visit.    REVIEW OF SYSTEMS:  [X]  denotes positive finding, [ ]  denotes negative finding Cardiac  Comments:  Chest pain or chest pressure:    Shortness of breath upon exertion:    Short of breath when lying flat:    Irregular heart rhythm:        Vascular    Pain in calf, thigh, or hip brought on by ambulation:    Pain in feet at night that wakes you up from your sleep:     Blood clot in your veins: x   Leg swelling:  x       Pulmonary    Oxygen at home:    Productive cough:     Wheezing:         Neurologic    Sudden weakness in arms or legs:  x    Sudden numbness in arms or legs:  x   Sudden onset of difficulty speaking or slurred speech:    Temporary loss of vision in one eye:  Problems with dizziness:         Gastrointestinal    Blood in stool:     Vomited blood:         Genitourinary    Burning when urinating:     Blood in urine: x       Psychiatric    Major depression:         Hematologic    Bleeding problems:    Problems with blood clotting too easily:        Skin    Rashes or ulcers:        Constitutional    Fever or chills:      PHYSICAL EXAM: Vitals:   01/06/20 1334  BP: 130/76  Pulse: 78  Resp: 20  Temp: 97.6 F (36.4 C)  SpO2: 97%  Weight: 248 lb (112.5 kg)  Height: 6\' 1"  (1.854 m)    GENERAL: The patient is a well-nourished male, in no acute distress. The vital signs are documented above. CARDIOVASCULAR: 2+ radial pulses bilaterally.  Moderate swelling in both lower extremities.  I do not palpate pulses PULMONARY: There is good air exchange  ABDOMEN: Soft and non-tender  MUSCULOSKELETAL: There are no major deformities or cyanosis. NEUROLOGIC: No focal weakness or paresthesias are detected. SKIN: There are no ulcers or rashes noted. PSYCHIATRIC: The patient has a normal affect.  DATA:  Duplex right leg from yesterday reveals right external iliac vein and common femoral DVT, age-indeterminate  MEDICAL ISSUES: Had long discussion with the patient.  I am concerned regarding his ongoing gross hematuria despite attempts at Lovenox injection.  He understandably does not want to continue lifelong Lovenox injections.  Did discuss the option of vena cava filter.  Explained that this would be very active at reducing his risk for pulmonary embolus.  Explained that it would not prevent additional future DVT or improve resolution of the current DVT.  I feel that this is his best treatment option given his ongoing gross hematuria.  We will coordinate this as an outpatient at his earliest  [place]. Fannie Gathright, MD [facility] Vascular and Vein Specialists of [facility] Tel [phone] Pager [phone]

## 2020-01-06 NOTE — Telephone Encounter (Signed)
-----   Message from Franchot Gallo, MD sent at 01/02/2020  6:57 AM EST ----- It seems to be draining OK. Keep scheduled appt.  ----- Message ----- From: Dorisann Frames, RN Sent: 12/31/2019   9:36 AM EST To: Franchot Gallo, MD  I suggested scheduled pt for next Tuesday to be seen. Pt has vascular appt he can not miss. We scheduled for 2/23 due to schedule availability. Can you please review and see if that is appropriate? DC note states he has hematuria noted in nephrostomy bag.

## 2020-01-08 ENCOUNTER — Other Ambulatory Visit (HOSPITAL_COMMUNITY): Payer: Medicaid Other

## 2020-01-13 ENCOUNTER — Other Ambulatory Visit: Payer: Self-pay

## 2020-01-13 ENCOUNTER — Ambulatory Visit (HOSPITAL_COMMUNITY)
Admission: RE | Admit: 2020-01-13 | Discharge: 2020-01-13 | Disposition: A | Discharge status: Home / Self Care | Payer: Medicaid Other | Attending: Surgery | Admitting: Surgery

## 2020-01-13 ENCOUNTER — Encounter (HOSPITAL_COMMUNITY)
Admission: RE | Disposition: A | Discharge status: Home / Self Care | Payer: Self-pay | Source: Home / Self Care | Attending: Surgery

## 2020-01-13 DIAGNOSIS — E785 Hyperlipidemia, unspecified: Secondary | ICD-10-CM | POA: Insufficient documentation

## 2020-01-13 DIAGNOSIS — I82402 Acute embolism and thrombosis of unspecified deep veins of left lower extremity: Secondary | ICD-10-CM

## 2020-01-13 DIAGNOSIS — Z7901 Long term (current) use of anticoagulants: Secondary | ICD-10-CM | POA: Insufficient documentation

## 2020-01-13 DIAGNOSIS — N401 Enlarged prostate with lower urinary tract symptoms: Secondary | ICD-10-CM | POA: Insufficient documentation

## 2020-01-13 DIAGNOSIS — Z7982 Long term (current) use of aspirin: Secondary | ICD-10-CM | POA: Insufficient documentation

## 2020-01-13 DIAGNOSIS — H409 Unspecified glaucoma: Secondary | ICD-10-CM | POA: Insufficient documentation

## 2020-01-13 DIAGNOSIS — I69351 Hemiplegia and hemiparesis following cerebral infarction affecting right dominant side: Secondary | ICD-10-CM | POA: Insufficient documentation

## 2020-01-13 DIAGNOSIS — N184 Chronic kidney disease, stage 4 (severe): Secondary | ICD-10-CM | POA: Insufficient documentation

## 2020-01-13 DIAGNOSIS — I82422 Acute embolism and thrombosis of left iliac vein: Secondary | ICD-10-CM | POA: Insufficient documentation

## 2020-01-13 DIAGNOSIS — I82411 Acute embolism and thrombosis of right femoral vein: Secondary | ICD-10-CM | POA: Diagnosis present

## 2020-01-13 DIAGNOSIS — N138 Other obstructive and reflux uropathy: Secondary | ICD-10-CM | POA: Diagnosis not present

## 2020-01-13 DIAGNOSIS — E1151 Type 2 diabetes mellitus with diabetic peripheral angiopathy without gangrene: Secondary | ICD-10-CM | POA: Insufficient documentation

## 2020-01-13 DIAGNOSIS — I2510 Atherosclerotic heart disease of native coronary artery without angina pectoris: Secondary | ICD-10-CM | POA: Insufficient documentation

## 2020-01-13 DIAGNOSIS — E1122 Type 2 diabetes mellitus with diabetic chronic kidney disease: Secondary | ICD-10-CM | POA: Insufficient documentation

## 2020-01-13 DIAGNOSIS — Z79899 Other long term (current) drug therapy: Secondary | ICD-10-CM | POA: Insufficient documentation

## 2020-01-13 DIAGNOSIS — K219 Gastro-esophageal reflux disease without esophagitis: Secondary | ICD-10-CM | POA: Diagnosis not present

## 2020-01-13 DIAGNOSIS — I129 Hypertensive chronic kidney disease with stage 1 through stage 4 chronic kidney disease, or unspecified chronic kidney disease: Secondary | ICD-10-CM | POA: Diagnosis not present

## 2020-01-13 HISTORY — PX: INTRAVASCULAR ULTRASOUND/IVUS: CATH118244

## 2020-01-13 HISTORY — PX: LOWER EXTREMITY VENOGRAPHY: CATH118253

## 2020-01-13 HISTORY — PX: IVC VENOGRAPHY: CATH118301

## 2020-01-13 LAB — POCT I-STAT, CHEM 8
BUN: 42 mg/dL — ABNORMAL HIGH (ref 6–20)
Calcium, Ion: 1.16 mmol/L (ref 1.15–1.40)
Chloride: 103 mmol/L (ref 98–111)
Creatinine, Ser: 3.2 mg/dL — ABNORMAL HIGH (ref 0.61–1.24)
Glucose, Bld: 131 mg/dL — ABNORMAL HIGH (ref 70–99)
HCT: 29 % — ABNORMAL LOW (ref 39.0–52.0)
Hemoglobin: 9.9 g/dL — ABNORMAL LOW (ref 13.0–17.0)
Potassium: 5 mmol/L (ref 3.5–5.1)
Sodium: 138 mmol/L (ref 135–145)
TCO2: 25 mmol/L (ref 22–32)

## 2020-01-13 LAB — PROTIME-INR
INR: 1.2 (ref 0.8–1.2)
Prothrombin Time: 15.4 seconds — ABNORMAL HIGH (ref 11.4–15.2)

## 2020-01-13 SURGERY — INTRAVASCULAR ULTRASOUND/IVUS
Anesthesia: LOCAL

## 2020-01-13 MED ORDER — FENTANYL CITRATE (PF) 100 MCG/2ML IJ SOLN
INTRAMUSCULAR | Status: AC
  Filled 2020-01-13: qty 2

## 2020-01-13 MED ORDER — MIDAZOLAM HCL 2 MG/2ML IJ SOLN
INTRAMUSCULAR | Status: DC | PRN
  Administered 2020-01-13 (×2): 1 mg via INTRAVENOUS

## 2020-01-13 MED ORDER — FENTANYL CITRATE (PF) 100 MCG/2ML IJ SOLN
INTRAMUSCULAR | Status: DC | PRN
  Administered 2020-01-13: 50 ug via INTRAVENOUS
  Administered 2020-01-13: 25 ug via INTRAVENOUS

## 2020-01-13 MED ORDER — MIDAZOLAM HCL 2 MG/2ML IJ SOLN
INTRAMUSCULAR | Status: AC
  Filled 2020-01-13: qty 2

## 2020-01-13 MED ORDER — SODIUM CHLORIDE 0.9 % IV SOLN: INTRAVENOUS | Status: DC

## 2020-01-13 MED ORDER — IODIXANOL 320 MG/ML IV SOLN
INTRAVENOUS | Status: DC | PRN
  Administered 2020-01-13: 13:00:00 5 mL via INTRAVENOUS

## 2020-01-13 MED ORDER — HEPARIN (PORCINE) IN NACL 1000-0.9 UT/500ML-% IV SOLN
INTRAVENOUS | Status: AC
  Filled 2020-01-13: qty 500

## 2020-01-13 MED ORDER — HEPARIN (PORCINE) IN NACL 1000-0.9 UT/500ML-% IV SOLN
INTRAVENOUS | Status: DC | PRN
  Administered 2020-01-13: 500 mL

## 2020-01-13 MED ORDER — LIDOCAINE HCL (PF) 1 % IJ SOLN
INTRAMUSCULAR | Status: AC
  Filled 2020-01-13: qty 30

## 2020-01-13 MED ORDER — LIDOCAINE HCL (PF) 1 % IJ SOLN
INTRAMUSCULAR | Status: DC | PRN
  Administered 2020-01-13: 3 mL via INTRADERMAL
  Administered 2020-01-13: 15 mL via INTRADERMAL

## 2020-01-13 SURGICAL SUPPLY — 13 items
BAG SNAP BAND KOVER 36X36 (MISCELLANEOUS) ×3 IMPLANT
CATH ANGIO 5F BER2 65CM (CATHETERS) ×3 IMPLANT
CATH VISIONS PV .035 IVUS (CATHETERS) ×3 IMPLANT
KIT MICROPUNCTURE NIT STIFF (SHEATH) ×3 IMPLANT
KIT PV (KITS) ×3 IMPLANT
PROTECTION STATION PRESSURIZED (MISCELLANEOUS) ×3
SHEATH PINNACLE 8F 10CM (SHEATH) ×3 IMPLANT
SHEATH PROBE COVER 6X72 (BAG) ×3 IMPLANT
STATION PROTECTION PRESSURIZED (MISCELLANEOUS) ×2 IMPLANT
TRANSDUCER W/STOPCOCK (MISCELLANEOUS) ×3 IMPLANT
TRAY PV CATH (CUSTOM PROCEDURE TRAY) ×3 IMPLANT
WIRE AMPLATZ SSTIFF .035X260CM (WIRE) ×3 IMPLANT
WIRE BENTSON .035X145CM (WIRE) ×6 IMPLANT

## 2020-01-13 NOTE — Progress Notes (Signed)
Attempted to call report to [facility].  Talked to operator and informed no answer at nurses station.  Left message for nurse to call for report.

## 2020-01-13 NOTE — Op Note (Signed)
[name] MRN: [id number] DOB: Jun 27, 1961 Sex: male  01/13/2020 Pre-operative Diagnosis: DVT Post-operative diagnosis:  Same Surgeon:  Annamarie Major Procedure Performed:  1.  Ultrasound-guided access, left femoral vein  2.  Ultrasound-guided access, right internal jugular vein  3.  Intravascular ultrasound (IVUS) right internal jugular vein, superior vena cava, inferior vena cava  4.  Inferior venacavogram  5.  Left common iliac and external iliac venogram  6.  Conscious sedation (50 minutes)    Indications: This is a 59 year old gentleman who was seen for DVT and gross hematuria.  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  He has a nephrostomy tube in place.  Because of gross hematuria he cannot have anticoagulation and so vena cava filter was recommended.  Procedure:  The patient was identified in the holding area and taken to room 8.  The patient was then placed supine on the table and prepped and draped in the usual sterile fashion.  A time out was called.  Conscious sedation was administered with the use of IV fentanyl and Versed under continuous physician and nurse monitoring.  Heart rate, blood pressure, and oxygen saturation were continuously monitored.  Total sedation time was 67minutes.  Ultrasound was used to evaluate the right common femoral vein which had visible thrombus.  It was partially compressible.  I elected not to cannulate this vein because of the thrombus.  Ultrasound was then used to evaluate left common femoral vein which was widely patent easily compressible.  1% Marcaine was used local anesthesia.  Left common femoral vein was then cannulated ultrasound guidance with a micropuncture needle.  An 018 wire was advanced without resistance and a micropuncture sheath was placed.  A Bentson wire was inserted followed by an 8 [place] sheath.  I was unable to navigate a wire into the central venous system.  Even with the use of a Berenstein 2  catheter.  I felt that the vein was occluded.  Attention was then turned towards the right internal jugular vein.  The right neck was prepped and draped sterilely.  The bilateral jugular vein was wired with ultrasound is widely patent is a compressible.  1 July he was used with anesthesia.  The right internal jugular vein was then cannulated on ultrasound guidance the micropuncture needle.  An 018 wire was advanced without resistance and a micropuncture sheath was placed.  An 035 wire was then inserted followed by an 8 [place] sheath.  I then used a Berenstein 2 catheter and a Bentson wire to navigate into the inferior vena cava.  I was unable to advance the wire past approximately L3.  I then inserted a Amplatz superstiff wire and again had difficulty advancing the wire distally.  I then inserted the intravascular ultrasound catheter.  This showed that the internal jugular vein on the left in the superior vena cava were widely patent.  As I got into the inferior vena cava just below the level of the renal veins, it appeared to be occluded, as there was no visible lumen on ultrasound.  I then removed the IVUS catheter and reinserted the Berenstein 2 catheter and performed a inferior venacavogram.  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  I then also perform angiography through the sheath in the right groin which confirmed that the external iliac vein was patent but the common iliac vein was occluded.  A total of 10 cc of contrast were utilized.  Both sheaths were then removed and manual  pressure was held for hemostasis.  There were no immediate complications.   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    Theotis Burrow, M.D., [facility] Vascular and Vein Specialists of [place] Office: [phone] Pager:  [phone]

## 2020-01-13 NOTE — Discharge Instructions (Signed)
Femoral Site Care This sheet gives you information about how to care for yourself after your procedure. Your health care provider may also give you more specific instructions. If you have problems or questions, contact your health care provider. What can I expect after the procedure? After the procedure, it is common to have:  Bruising that usually fades within 1-2 weeks.  Tenderness at the site. Follow these instructions at home: Wound care  Follow instructions from your health care provider about how to take care of your insertion site. Make sure you: ? Wash your hands with soap and water before you change your bandage (dressing). If soap and water are not available, use hand sanitizer. ? Change your dressing as told by your health care provider. ? Leave stitches (sutures), skin glue, or adhesive strips in place. These skin closures may need to stay in place for 2 weeks or longer. If adhesive strip edges start to loosen and curl up, you may trim the loose edges. Do not remove adhesive strips completely unless your health care provider tells you to do that.  Do not take baths, swim, or use a hot tub until your health care provider approves.  You may shower 24-48 hours after the procedure or as told by your health care provider. ? Gently wash the site with plain soap and water. ? Pat the area dry with a clean towel. ? Do not rub the site. This may cause bleeding.  Do not apply powder or lotion to the site. Keep the site clean and dry.  Check your femoral site every day for signs of infection. Check for: ? Redness, swelling, or pain. ? Fluid or blood. ? Warmth. ? Pus or a bad smell. Activity  For the first 2-3 days after your procedure, or as long as directed: ? Avoid climbing stairs as much as possible. ? Do not squat.  Do not lift anything that is heavier than 10 lb (4.5 kg), or the limit that you are told, until your health care provider says that it is safe.  Rest as  directed. ? Avoid sitting for a long time without moving. Get up to take short walks every 1-2 hours.  Do not drive for 24 hours if you were given a medicine to help you relax (sedative). General instructions  Take over-the-counter and prescription medicines only as told by your health care provider.  Keep all follow-up visits as told by your health care provider. This is important. Contact a health care provider if you have:  A fever or chills.  You have redness, swelling, or pain around your insertion site. Get help right away if:  The catheter insertion area swells very fast.  You pass out.  You suddenly start to sweat or your skin gets clammy.  The catheter insertion area is bleeding, and the bleeding does not stop when you hold steady pressure on the area.  The area near or just beyond the catheter insertion site becomes pale, cool, tingly, or numb. These symptoms may represent a serious problem that is an emergency. Do not wait to see if the symptoms will go away. Get medical help right away. Call your local emergency services (911 in the [place] Do not drive yourself to the hospital. Summary  After the procedure, it is common to have bruising that usually fades within 1-2 weeks.  Check your femoral site every day for signs of infection.  Do not lift anything that is heavier than 10 lb (4.5 kg), or the   limit that you are told, until your health care provider says that it is safe. This information is not intended to replace advice given to you by your health care provider. Make sure you discuss any questions you have with your health care provider. Document Revised: 12/03/2017 Document Reviewed: 12/03/2017 Elsevier Patient Education  2020 [employer]  

## 2020-01-13 NOTE — Progress Notes (Signed)
Kayla from [facility] called to obtain report.  Instruction sheet to go with pt to [facility].

## 2020-01-13 NOTE — Interval H&P Note (Signed)
History and Physical Interval Note:  01/13/2020 12:26 PM  Brendan [name]  has presented today for surgery, with the diagnosis of right leg dvt.  The various methods of treatment have been discussed with the patient and family. After consideration of risks, benefits and other options for treatment, the patient has consented to  Procedure(s): IVC FILTER INSERTION (N/A) as a surgical intervention.  The patient's history has been reviewed, patient examined, no change in status, stable for surgery.  I have reviewed the patient's chart and labs.  Questions were answered to the patient's satisfaction.     Annamarie Major

## 2020-01-14 ENCOUNTER — Other Ambulatory Visit: Payer: Self-pay

## 2020-01-14 ENCOUNTER — Emergency Department (HOSPITAL_COMMUNITY)
Admission: EM | Admit: 2020-01-14 | Discharge: 2020-01-14 | Disposition: A | Discharge status: Home / Self Care | Payer: Medicaid Other | Attending: Emergency Medicine | Admitting: Emergency Medicine

## 2020-01-14 DIAGNOSIS — Z8673 Personal history of transient ischemic attack (TIA), and cerebral infarction without residual deficits: Secondary | ICD-10-CM | POA: Diagnosis not present

## 2020-01-14 DIAGNOSIS — Z7982 Long term (current) use of aspirin: Secondary | ICD-10-CM | POA: Diagnosis not present

## 2020-01-14 DIAGNOSIS — I82409 Acute embolism and thrombosis of unspecified deep veins of unspecified lower extremity: Secondary | ICD-10-CM | POA: Diagnosis present

## 2020-01-14 DIAGNOSIS — I2510 Atherosclerotic heart disease of native coronary artery without angina pectoris: Secondary | ICD-10-CM | POA: Diagnosis not present

## 2020-01-14 DIAGNOSIS — N184 Chronic kidney disease, stage 4 (severe): Secondary | ICD-10-CM | POA: Insufficient documentation

## 2020-01-14 DIAGNOSIS — I129 Hypertensive chronic kidney disease with stage 1 through stage 4 chronic kidney disease, or unspecified chronic kidney disease: Secondary | ICD-10-CM | POA: Diagnosis not present

## 2020-01-14 DIAGNOSIS — E1122 Type 2 diabetes mellitus with diabetic chronic kidney disease: Secondary | ICD-10-CM | POA: Insufficient documentation

## 2020-01-14 DIAGNOSIS — M79622 Pain in left upper arm: Secondary | ICD-10-CM | POA: Diagnosis not present

## 2020-01-14 DIAGNOSIS — M79602 Pain in left arm: Secondary | ICD-10-CM

## 2020-01-14 DIAGNOSIS — Z79899 Other long term (current) drug therapy: Secondary | ICD-10-CM | POA: Diagnosis not present

## 2020-01-14 MED ORDER — OXYCODONE-ACETAMINOPHEN 5-325 MG PO TABS
1.0000 | ORAL_TABLET | Freq: Once | ORAL | Status: AC
  Administered 2020-01-14: 1 via ORAL
  Filled 2020-01-14: qty 1

## 2020-01-14 NOTE — ED Provider Notes (Signed)
[facility] Provider Note   CSN: [id number] Arrival date & time: 01/14/20  1737     History Chief Complaint  Patient presents with  . Arm Pain    Bilateral    Brendan [name] is a 59 y.o. male.  59 year old male presents from nursing facility for extensive DVT.  Per nursing notes, patient has extensive DVT and nursing facility was not comfortable with patient remaining at the facility.  Patient is taking Lovenox injections daily due to DVT extending to infrarenal IVC, unable to take other anticoagulants due to hematuria.  Patient is managed by vascular surgery, was seen yesterday to have IVC filter placed however unable to place the filter due to extensive DVT and patient was sent back to the facility.  Patient states that he also has a clot in his left arm, states that he bumped his arm on something a few days ago, has a small wound to his elbow and extensive bruising to the arm.  Patient denies any other complaints or concerns today.        Past Medical History:  Diagnosis Date  . Acidosis   . Acute unilateral obstructive uropathy   . Allergic rhinitis   . Chronic kidney disease, stage IV (severe) ([place])   . Constipation   . Coronary atherosclerosis of native coronary artery   . Diabetes mellitus without complication ([place])   . DVT (deep venous thrombosis) ([place])   . Enlarged prostate with urinary obstruction   . Essential hypertension, malignant   . GERD (gastroesophageal reflux disease)   . Glaucoma   . Hemiparesis affecting right side as late effect of cerebrovascular accident ([place])   . Hemiplegia ([place])   . Hydronephrosis   . Hyperlipidemia   . Hypertension   . Hypokalemia   . Muscle weakness (generalized)   . PVD (peripheral vascular disease) ([place])   . Retroperitoneal fibrosis   . Stricture or kinking of ureter   . Stroke [facility])    right sided hemiparesis  . Vitamin D deficiency     Patient Active Problem List   Diagnosis Date Noted  . DVT (deep  venous thrombosis) ([place]) 12/19/2019  . Right leg DVT ([place]) 12/18/2019  . Symptomatic anemia 03/22/2018  . History of DVT in adulthood 03/22/2018  . Gross hematuria   . Nephrostomy tube displaced ([place]) 07/19/2017  . Pyelonephritis 02/16/2017  . 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 Coronary atherosclerosis of native coronary artery   . Essential hypertension   . Stroke ([place])   . Diabetes mellitus without complication ([place])   . Chronic kidney disease, stage IV (severe) ([place])   . GERD (gastroesophageal reflux disease)   . Glaucoma   . Hemiparesis affecting right side as late effect of cerebrovascular accident ([facility])   . Enlarged prostate with urinary obstruction   . Constipation     Past Surgical History:  Procedure Laterality Date  . INTRAVASCULAR ULTRASOUND/IVUS N/A 01/13/2020   Procedure: Intravascular Ultrasound/IVUS;  Surgeon: Serafina Mitchell, MD;  Location: [facility];  Service: Cardiovascular;  Laterality: N/A;  IJ, SVC, IVC  . IR GENERIC HISTORICAL  11/09/2016   IR NEPHROSTOMY EXCHANGE LEFT 11/09/2016 Corrie Mckusick, DO [facility]-INTERV RAD  . IR GENERIC HISTORICAL  01/09/2017   IR NEPHROSTOMY EXCHANGE LEFT 01/09/2017 [name], MD [facility]-INTERV RAD  . IR NEPHROSTOGRAM LEFT THRU EXISTING ACCESS  11/12/2019  . IR NEPHROSTOMY EXCHANGE LEFT  03/06/2017  . IR NEPHROSTOMY EXCHANGE LEFT  04/13/2017  . IR NEPHROSTOMY EXCHANGE LEFT  06/08/2017  .  IR NEPHROSTOMY EXCHANGE LEFT  07/20/2017  . IR NEPHROSTOMY EXCHANGE LEFT  08/03/2017  . IR NEPHROSTOMY EXCHANGE LEFT  09/28/2017  . IR NEPHROSTOMY EXCHANGE LEFT  11/29/2017  . IR NEPHROSTOMY EXCHANGE LEFT  01/31/2018  . IR NEPHROSTOMY EXCHANGE LEFT  04/04/2018  . IR NEPHROSTOMY EXCHANGE LEFT  06/27/2018  . IR NEPHROSTOMY EXCHANGE LEFT  09/19/2018  . IR NEPHROSTOMY EXCHANGE LEFT  12/10/2018  . IR NEPHROSTOMY EXCHANGE LEFT  06/26/2019  . IR NEPHROSTOMY EXCHANGE LEFT  10/16/2019  . IVC VENOGRAPHY N/A 01/13/2020   Procedure: IVC Venography;  Surgeon: Serafina Mitchell, MD;  Location: [facility];  Service: Cardiovascular;  Laterality: N/A;  . LOWER EXTREMITY VENOGRAPHY Left 01/13/2020   Procedure: LOWER EXTREMITY VENOGRAPHY;  Surgeon: Serafina Mitchell, MD;  Location: [facility];  Service: Cardiovascular;  Laterality: Left;  . NEPHROSTOMY TUBE PLACEMENT ([place] HX)         Family History  Family history unknown: Yes    Social History   Tobacco Use  . Smoking status: Never Smoker  . Smokeless tobacco: Never Used  Substance Use Topics  . Alcohol use: Not Currently  . Drug use: Not Currently    Home Medications Prior to Admission medications   Medication Sig Start Date End Date Taking? Authorizing Provider  acetaminophen (TYLENOL) 325 MG tablet Take 650 mg by mouth every 8 (eight) hours as needed for moderate pain or headache.    [provider]  aspirin EC 81 MG tablet Take 81 mg by mouth daily.    [provider]  calcium carbonate (CALTRATE 600) 1500 (600 Ca) MG TABS tablet Take 1 tablet by mouth 2 (two) times daily.     [provider]  cetaphil (CETAPHIL) lotion Apply 1 application topically at bedtime. Applied to feet/ankles    [provider]  Cholecalciferol 1000 units tablet Take 2,000 Units by mouth daily.     [provider]  enoxaparin (LOVENOX) 120 MG/0.8ML injection Inject 0.73 mLs (110 mg total) into the skin every 12 (twelve) hours. 12/22/19 01/21/20  Manuella Ghazi, Pratik D, DO  ezetimibe (ZETIA) 10 MG tablet Take 10 mg by mouth every evening.     [provider]  furosemide (LASIX) 20 MG tablet Take 20 mg by mouth daily.    [provider]  guaiFENesin (MUCINEX) 600 MG 12 hr tablet Take 600 mg by mouth 2 (two) times daily.    [provider]  loratadine (CLARITIN) 10 MG tablet Take 10 mg by mouth daily.    [provider]  mycophenolate (CELLCEPT) 500 MG tablet Take 500 mg by mouth 2 (two) times daily.    [provider]    oxyCODONE-acetaminophen (PERCOCET/ROXICET) 5-325 MG tablet Take 1 tablet by mouth every 8 (eight) hours as needed for severe pain.    [provider]  senna (SENOKOT) 8.6 MG TABS tablet Take 8.6 mg by mouth daily.     [provider]  simvastatin (ZOCOR) 40 MG tablet Take 40 mg by mouth at bedtime.    [provider]  sodium bicarbonate 650 MG tablet Take 650 mg by mouth 3 (three) times daily.    [provider]  tamsulosin (FLOMAX) 0.4 MG CAPS capsule Take 0.4 mg by mouth at bedtime.    [provider]    Allergies    Other  Review of Systems   Review of Systems  Constitutional: Negative for fever.  Respiratory: Negative for shortness of breath.  Cardiovascular: Negative for chest pain.  Gastrointestinal: Negative for abdominal pain, nausea and vomiting.  Musculoskeletal: Positive for myalgias.  Skin: Positive for wound.  Allergic/Immunologic: Positive for immunocompromised state.  Hematological: Bruises/bleeds easily.  Psychiatric/Behavioral: Negative for confusion.  All other systems reviewed and are negative.   Physical Exam Updated Vital Signs BP 112/80 (BP Location: Right Arm)   Pulse 92   Temp 98.3 F (36.8 C) (Oral)   Resp 15   Ht 6\' 1"  (1.854 m)   Wt 112 kg   SpO2 100%   BMI 32.58 kg/m   Physical Exam Vitals and nursing note reviewed.  Constitutional:      General: He is not in acute distress.    Appearance: He is well-developed. He is not diaphoretic.  HENT:     Head: Normocephalic and atraumatic.  Cardiovascular:     Rate and Rhythm: Normal rate and regular rhythm.  Pulmonary:     Effort: Pulmonary effort is normal.     Breath sounds: Normal breath sounds.  Abdominal:     Tenderness: There is no abdominal tenderness.  Musculoskeletal:        General: Tenderness present.     Right lower leg: Edema present.     Left lower leg: Edema present.     Comments: Ecchymosis to left upper arm with bandaged wound to  left elbow area, no erythema.  Patient has difficulty flexing his elbow secondary to the bandage on his elbow as well as swelling in the arm.  Radial pulses present, sensation intact.  Skin:    General: Skin is warm and dry.  Neurological:     Mental Status: He is alert and oriented to person, place, and time.  Psychiatric:        Behavior: Behavior normal.     ED Results / Procedures / Treatments   Labs (all labs ordered are listed, but only abnormal results are displayed) Labs Reviewed - No data to display  EKG None  Radiology CARDIAC CATHETERIZATION  Result Date: 01/13/2020 [name] MRN: [id number] DOB: 05-May-1961 Sex: male 01/13/2020 Pre-operative Diagnosis: DVT Post-operative diagnosis:  Same Surgeon:  Annamarie Major Procedure Performed:  1.  Ultrasound-guided access, left femoral vein  2.  Ultrasound-guided access, right internal jugular vein  3.  Intravascular ultrasound (IVUS) right internal jugular vein, superior vena cava, inferior vena cava  4.  Inferior venacavogram  5.  Left common iliac and external iliac venogram  6.  Conscious sedation (50 minutes) Indications: This is a 59 year old gentleman who was seen for DVT and gross hematuria.  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  He has a nephrostomy tube in place.  Because of gross hematuria he cannot have anticoagulation and so vena cava filter was recommended. Procedure:  The patient was identified in the holding area and taken to room 8.  The patient was then placed supine on the table and prepped and draped in the usual sterile fashion.  A time out was called.  Conscious sedation was administered with the use of IV fentanyl and Versed under continuous physician and nurse monitoring.  Heart rate, blood pressure, and oxygen saturation were continuously monitored.  Total sedation time was 44minutes.  Ultrasound was used to evaluate the right common femoral vein which had visible thrombus.  It was  partially compressible.  I elected not to cannulate this vein because of the thrombus.  Ultrasound was then used to evaluate left common femoral vein which was widely patent easily compressible.  1% Marcaine was used local anesthesia.  Left common femoral vein was then cannulated ultrasound guidance with a micropuncture needle.  An 018 wire was advanced without resistance and a micropuncture sheath was placed.  A Bentson wire was inserted followed by an 8 [place] sheath.  I was unable to navigate a wire into the central venous system.  Even with the use of a Berenstein 2 catheter.  I felt that the vein was occluded. Attention was then turned towards the right internal jugular vein.  The right neck was prepped and draped sterilely.  The bilateral jugular vein was wired with ultrasound is widely patent is a compressible.  1 July he was used with anesthesia.  The right internal jugular vein was then cannulated on ultrasound guidance the micropuncture needle.  An 018 wire was advanced without resistance and a micropuncture sheath was placed.  An 035 wire was then inserted followed by an 8 [place] sheath.  I then used a Berenstein 2 catheter and a Bentson wire to navigate into the inferior vena cava.  I was unable to advance the wire past approximately L3.  I then inserted a Amplatz superstiff wire and again had difficulty advancing the wire distally.  I then inserted the intravascular ultrasound catheter.  This showed that the internal jugular vein on the left in the superior vena cava were widely patent.  As I got into the inferior vena cava just below the level of the renal veins, it appeared to be occluded, as there was no visible lumen on ultrasound.  I then removed the IVUS catheter and reinserted the Berenstein 2 catheter and performed a inferior venacavogram.  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  I then also perform angiography through the sheath in the right groin which confirmed that the external  iliac vein was patent but the common iliac vein was occluded.  A total of 10 cc of contrast were utilized.  Both sheaths were then removed and manual pressure was held for hemostasis.  There were no immediate complications. 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 Theotis Burrow, M.D., [facility] [place] Office: [phone] Pager:  [phone]  PERIPHERAL VASCULAR CATHETERIZATION  Result Date: 01/13/2020 [name] MRN: [id number] DOB: 1961/06/13 Sex: male 01/13/2020 Pre-operative Diagnosis: DVT Post-operative diagnosis:  Same Surgeon:  Annamarie Major Procedure Performed:  1.  Ultrasound-guided access, left femoral vein  2.  Ultrasound-guided access, right internal jugular vein  3.  Intravascular ultrasound (IVUS) right internal jugular vein, superior vena cava, inferior vena cava  4.  Inferior venacavogram  5.  Left common iliac and external iliac venogram  6.  Conscious sedation (50 minutes) Indications: This is a 59 year old gentleman who was seen for DVT and gross hematuria.  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  He has a nephrostomy tube in place.  Because of gross hematuria he cannot have anticoagulation and so vena cava filter was recommended. Procedure:  The patient was identified in the holding area and taken to room 8.  The patient was then placed supine on the table and prepped and draped in the usual sterile fashion.  A time out was called.  Conscious sedation was administered with the use of IV fentanyl and Versed under continuous physician and nurse monitoring.  Heart rate, blood pressure, and oxygen saturation were continuously monitored.  Total sedation time was 42minutes.  Ultrasound was used to evaluate  the right common femoral vein which had visible thrombus.  It was partially compressible.  I elected not to cannulate this vein because of the thrombus.  Ultrasound was  then used to evaluate left common femoral vein which was widely patent easily compressible.  1% Marcaine was used local anesthesia.  Left common femoral vein was then cannulated ultrasound guidance with a micropuncture needle.  An 018 wire was advanced without resistance and a micropuncture sheath was placed.  A Bentson wire was inserted followed by an 8 [place] sheath.  I was unable to navigate a wire into the central venous system.  Even with the use of a Berenstein 2 catheter.  I felt that the vein was occluded. Attention was then turned towards the right internal jugular vein.  The right neck was prepped and draped sterilely.  The bilateral jugular vein was wired with ultrasound is widely patent is a compressible.  1 July he was used with anesthesia.  The right internal jugular vein was then cannulated on ultrasound guidance the micropuncture needle.  An 018 wire was advanced without resistance and a micropuncture sheath was placed.  An 035 wire was then inserted followed by an 8 [place] sheath.  I then used a Berenstein 2 catheter and a Bentson wire to navigate into the inferior vena cava.  I was unable to advance the wire past approximately L3.  I then inserted a Amplatz superstiff wire and again had difficulty advancing the wire distally.  I then inserted the intravascular ultrasound catheter.  This showed that the internal jugular vein on the left in the superior vena cava were widely patent.  As I got into the inferior vena cava just below the level of the renal veins, it appeared to be occluded, as there was no visible lumen on ultrasound.  I then removed the IVUS catheter and reinserted the Berenstein 2 catheter and performed a inferior venacavogram.  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  I then also perform angiography through the sheath in the right groin which confirmed that the external iliac vein was patent but the common iliac vein was occluded.  A total of 10 cc of contrast were  utilized.  Both sheaths were then removed and manual pressure was held for hemostasis.  There were no immediate complications. 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 Theotis Burrow, M.D., [facility] [place] Office: [phone] Pager:  [phone]   Procedures Procedures (including critical care time)  Medications Ordered in ED Medications  oxyCODONE-acetaminophen (PERCOCET/ROXICET) 5-325 MG per tablet 1 tablet (1 tablet Oral Given 01/14/20 1916)    ED Course  I have reviewed the triage vital signs and the nursing notes.  Pertinent labs & imaging results that were available during my care of the patient were reviewed by me and considered in my medical decision making (see chart for details).  Clinical Course as of Jan 14 2044  Wed Jan 13, 5438  1970 59 year old male with known extensive lower extremity DVT sent by nursing facility with report that they were not comfortable caring for patient with his DVT.  Patient is on Lovenox, unable to take other anticoagulants due to hematuria.  Patient is frustrated that he was sent to the emergency room by nursing facility.  Patient reports pain in his left arm, states that he has a clot in this arm and that he bumped his arm a few days ago and  has a wound on the elbow which is being cared for. Review of records, I am unable to find any formal report of DVT in the left upper extremity.  Patient does have swelling of the arm with ecchymosis which may be secondary to hitting his arm and taking Lovenox however with his history of DVT, will have patient return tomorrow to have Doppler study.  Case was discussed with Dr. Lacinda Axon who has seen the patient and agrees with plan of care.  Patient was given a dose of his regular pain medication prior to discharge back to nursing facility.   [LM]    Clinical Course User Index [LM] Roque Lias   MDM  Rules/Calculators/A&P                      Final Clinical Impression(s) / ED Diagnoses Final diagnoses:  Pain of left upper extremity    Rx / DC Orders ED Discharge Orders         Ordered    US Carotid Duplex Bilateral  Status:  Canceled     01/14/20 1904    US Venous Img Upper Uni Left     01/14/20 2045           Tacy Learn, PA-C 01/14/20 2046    Nat Christen, MD 01/15/20 [phone]

## 2020-01-14 NOTE — ED Notes (Signed)
Called report to [facility] and went over pt's discharge instructions. Pt is not able to sign due to arm pain. Pt did verbalize understanding of instructions.

## 2020-01-14 NOTE — Discharge Instructions (Addendum)
IMPORTANT PATIENT INSTRUCTIONS:  Your ED provider has recommended an Outpatient Ultrasound.  Please call [phone] to schedule an appointment.  If your appointment is scheduled for a Saturday, Sunday or holiday, please go to the [facility] Emergency Department Registration Desk at least 15 minutes prior to your appointment time and tell them you are there for an ultrasound.    If your appointment is scheduled for a weekday (Monday-Friday), please go directly to the [facility] Radiology Department at least 15 minutes prior to your appointment time and tell them you are there for an ultrasound.  Please call [phone] with questions.   Follow up with patient's vascular surgeon/PCP for further care.

## 2020-01-14 NOTE — ED Triage Notes (Signed)
[facility] called and reports pt recently had a dvt in r lower extremity and was sent home from hospital on lovenox.  Reports went to cath lab yesterday for IVC placement but was told he couldn't do it because he had too many clots.  Nursing home staff says they weren't comfortable with pt being at the facility with multiple blood clots.

## 2020-01-22 ENCOUNTER — Emergency Department (HOSPITAL_COMMUNITY): Payer: Medicaid Other

## 2020-01-22 ENCOUNTER — Other Ambulatory Visit: Payer: Self-pay

## 2020-01-22 ENCOUNTER — Inpatient Hospital Stay (HOSPITAL_COMMUNITY)
Admission: EM | Admit: 2020-01-22 | Discharge: 2020-02-13 | DRG: 025 | Disposition: A | Discharge status: Skilled Nursing Facility | Payer: Medicaid Other | Source: Skilled Nursing Facility | Attending: Internal Medicine | Admitting: Internal Medicine

## 2020-01-22 ENCOUNTER — Encounter (HOSPITAL_COMMUNITY): Payer: Self-pay | Admitting: Emergency Medicine

## 2020-01-22 DIAGNOSIS — E440 Moderate protein-calorie malnutrition: Secondary | ICD-10-CM | POA: Diagnosis present

## 2020-01-22 DIAGNOSIS — K219 Gastro-esophageal reflux disease without esophagitis: Secondary | ICD-10-CM | POA: Diagnosis present

## 2020-01-22 DIAGNOSIS — D500 Iron deficiency anemia secondary to blood loss (chronic): Secondary | ICD-10-CM | POA: Diagnosis present

## 2020-01-22 DIAGNOSIS — R310 Gross hematuria: Secondary | ICD-10-CM | POA: Diagnosis present

## 2020-01-22 DIAGNOSIS — I10 Essential (primary) hypertension: Secondary | ICD-10-CM | POA: Diagnosis present

## 2020-01-22 DIAGNOSIS — E875 Hyperkalemia: Secondary | ICD-10-CM | POA: Diagnosis not present

## 2020-01-22 DIAGNOSIS — Z936 Other artificial openings of urinary tract status: Secondary | ICD-10-CM

## 2020-01-22 DIAGNOSIS — S065XAA Traumatic subdural hemorrhage with loss of consciousness status unknown, initial encounter: Secondary | ICD-10-CM

## 2020-01-22 DIAGNOSIS — I82411 Acute embolism and thrombosis of right femoral vein: Secondary | ICD-10-CM | POA: Diagnosis present

## 2020-01-22 DIAGNOSIS — R29719 NIHSS score 19: Secondary | ICD-10-CM | POA: Diagnosis present

## 2020-01-22 DIAGNOSIS — R1311 Dysphagia, oral phase: Secondary | ICD-10-CM | POA: Diagnosis present

## 2020-01-22 DIAGNOSIS — D631 Anemia in chronic kidney disease: Secondary | ICD-10-CM | POA: Diagnosis present

## 2020-01-22 DIAGNOSIS — G8194 Hemiplegia, unspecified affecting left nondominant side: Secondary | ICD-10-CM | POA: Diagnosis present

## 2020-01-22 DIAGNOSIS — I69351 Hemiplegia and hemiparesis following cerebral infarction affecting right dominant side: Secondary | ICD-10-CM

## 2020-01-22 DIAGNOSIS — Z6832 Body mass index (BMI) 32.0-32.9, adult: Secondary | ICD-10-CM

## 2020-01-22 DIAGNOSIS — I6200 Nontraumatic subdural hemorrhage, unspecified: Principal | ICD-10-CM | POA: Diagnosis present

## 2020-01-22 DIAGNOSIS — Z7901 Long term (current) use of anticoagulants: Secondary | ICD-10-CM

## 2020-01-22 DIAGNOSIS — D62 Acute posthemorrhagic anemia: Secondary | ICD-10-CM | POA: Diagnosis not present

## 2020-01-22 DIAGNOSIS — N136 Pyonephrosis: Secondary | ICD-10-CM | POA: Diagnosis present

## 2020-01-22 DIAGNOSIS — G9341 Metabolic encephalopathy: Secondary | ICD-10-CM | POA: Diagnosis not present

## 2020-01-22 DIAGNOSIS — R319 Hematuria, unspecified: Secondary | ICD-10-CM

## 2020-01-22 DIAGNOSIS — E785 Hyperlipidemia, unspecified: Secondary | ICD-10-CM | POA: Diagnosis present

## 2020-01-22 DIAGNOSIS — N184 Chronic kidney disease, stage 4 (severe): Secondary | ICD-10-CM | POA: Diagnosis present

## 2020-01-22 DIAGNOSIS — E119 Type 2 diabetes mellitus without complications: Secondary | ICD-10-CM

## 2020-01-22 DIAGNOSIS — S065X9A Traumatic subdural hemorrhage with loss of consciousness of unspecified duration, initial encounter: Secondary | ICD-10-CM | POA: Diagnosis present

## 2020-01-22 DIAGNOSIS — I129 Hypertensive chronic kidney disease with stage 1 through stage 4 chronic kidney disease, or unspecified chronic kidney disease: Secondary | ICD-10-CM | POA: Diagnosis present

## 2020-01-22 DIAGNOSIS — R4701 Aphasia: Secondary | ICD-10-CM | POA: Diagnosis present

## 2020-01-22 DIAGNOSIS — E1151 Type 2 diabetes mellitus with diabetic peripheral angiopathy without gangrene: Secondary | ICD-10-CM | POA: Diagnosis present

## 2020-01-22 DIAGNOSIS — N400 Enlarged prostate without lower urinary tract symptoms: Secondary | ICD-10-CM | POA: Diagnosis present

## 2020-01-22 DIAGNOSIS — Z20822 Contact with and (suspected) exposure to covid-19: Secondary | ICD-10-CM | POA: Diagnosis present

## 2020-01-22 DIAGNOSIS — N135 Crossing vessel and stricture of ureter without hydronephrosis: Secondary | ICD-10-CM | POA: Diagnosis present

## 2020-01-22 DIAGNOSIS — Z86718 Personal history of other venous thrombosis and embolism: Secondary | ICD-10-CM

## 2020-01-22 DIAGNOSIS — E1165 Type 2 diabetes mellitus with hyperglycemia: Secondary | ICD-10-CM | POA: Diagnosis not present

## 2020-01-22 DIAGNOSIS — K682 Retroperitoneal fibrosis: Secondary | ICD-10-CM | POA: Diagnosis present

## 2020-01-22 DIAGNOSIS — I2510 Atherosclerotic heart disease of native coronary artery without angina pectoris: Secondary | ICD-10-CM | POA: Diagnosis present

## 2020-01-22 DIAGNOSIS — Z993 Dependence on wheelchair: Secondary | ICD-10-CM

## 2020-01-22 DIAGNOSIS — Z95828 Presence of other vascular implants and grafts: Secondary | ICD-10-CM

## 2020-01-22 DIAGNOSIS — N179 Acute kidney failure, unspecified: Secondary | ICD-10-CM | POA: Diagnosis not present

## 2020-01-22 DIAGNOSIS — I82401 Acute embolism and thrombosis of unspecified deep veins of right lower extremity: Secondary | ICD-10-CM | POA: Diagnosis present

## 2020-01-22 DIAGNOSIS — E1122 Type 2 diabetes mellitus with diabetic chronic kidney disease: Secondary | ICD-10-CM | POA: Diagnosis present

## 2020-01-22 DIAGNOSIS — H409 Unspecified glaucoma: Secondary | ICD-10-CM | POA: Diagnosis present

## 2020-01-22 DIAGNOSIS — R509 Fever, unspecified: Secondary | ICD-10-CM

## 2020-01-22 DIAGNOSIS — Z7982 Long term (current) use of aspirin: Secondary | ICD-10-CM

## 2020-01-22 LAB — COMPREHENSIVE METABOLIC PANEL
ALT: 88 U/L — ABNORMAL HIGH (ref 0–44)
AST: 57 U/L — ABNORMAL HIGH (ref 15–41)
Albumin: 3.4 g/dL — ABNORMAL LOW (ref 3.5–5.0)
Alkaline Phosphatase: 70 U/L (ref 38–126)
Anion gap: 13 (ref 5–15)
BUN: 64 mg/dL — ABNORMAL HIGH (ref 6–20)
CO2: 22 mmol/L (ref 22–32)
Calcium: 8.7 mg/dL — ABNORMAL LOW (ref 8.9–10.3)
Chloride: 99 mmol/L (ref 98–111)
Creatinine, Ser: 3.38 mg/dL — ABNORMAL HIGH (ref 0.61–1.24)
GFR calc Af Amer: 22 mL/min — ABNORMAL LOW (ref >60)
GFR calc non Af Amer: 19 mL/min — ABNORMAL LOW (ref >60)
Glucose, Bld: 171 mg/dL — ABNORMAL HIGH (ref 70–99)
Potassium: 4.9 mmol/L (ref 3.5–5.1)
Sodium: 134 mmol/L — ABNORMAL LOW (ref 135–145)
Total Bilirubin: 0.7 mg/dL (ref 0.3–1.2)
Total Protein: 7.6 g/dL (ref 6.5–8.1)

## 2020-01-22 LAB — DIFFERENTIAL
Abs Immature Granulocytes: 0.25 10*3/uL — ABNORMAL HIGH (ref 0.00–0.07)
Basophils Absolute: 0 10*3/uL (ref 0.0–0.1)
Basophils Relative: 0 %
Eosinophils Absolute: 0.2 10*3/uL (ref 0.0–0.5)
Eosinophils Relative: 2 %
Immature Granulocytes: 3 %
Lymphocytes Relative: 8 %
Lymphs Abs: 0.8 10*3/uL (ref 0.7–4.0)
Monocytes Absolute: 0.6 10*3/uL (ref 0.1–1.0)
Monocytes Relative: 6 %
Neutro Abs: 7.9 10*3/uL — ABNORMAL HIGH (ref 1.7–7.7)
Neutrophils Relative %: 81 %

## 2020-01-22 LAB — CBC
HCT: 22.2 % — ABNORMAL LOW (ref 39.0–52.0)
Hemoglobin: 6.4 g/dL — CL (ref 13.0–17.0)
MCH: 27.2 pg (ref 26.0–34.0)
MCHC: 28.8 g/dL — ABNORMAL LOW (ref 30.0–36.0)
MCV: 94.5 fL (ref 80.0–100.0)
Platelets: 376 10*3/uL (ref 150–400)
RBC: 2.35 MIL/uL — ABNORMAL LOW (ref 4.22–5.81)
RDW: 17.4 % — ABNORMAL HIGH (ref 11.5–15.5)
WBC: 9.7 10*3/uL (ref 4.0–10.5)
nRBC: 1.8 % — ABNORMAL HIGH (ref 0.0–0.2)

## 2020-01-22 LAB — APTT: aPTT: 48 seconds — ABNORMAL HIGH (ref 24–36)

## 2020-01-22 LAB — PROTIME-INR
INR: 1.2 (ref 0.8–1.2)
Prothrombin Time: 15.3 seconds — ABNORMAL HIGH (ref 11.4–15.2)

## 2020-01-22 LAB — CBG MONITORING, ED: Glucose-Capillary: 148 mg/dL — ABNORMAL HIGH (ref 70–99)

## 2020-01-22 LAB — ETHANOL: Alcohol, Ethyl (B): <10 mg/dL (ref <10)

## 2020-01-22 MED ORDER — PROTAMINE SULFATE 10 MG/ML IV SOLN
50.0000 mg | Freq: Once | INTRAVENOUS | Status: AC
  Administered 2020-01-22: 22:00:00 50 mg via INTRAVENOUS
  Filled 2020-01-22: qty 5

## 2020-01-22 MED ORDER — PROTAMINE SULFATE 10 MG/ML IV SOLN
50.0000 mg | Freq: Once | INTRAVENOUS | Status: DC
  Filled 2020-01-22: qty 5

## 2020-01-22 MED ORDER — SODIUM CHLORIDE 0.9 % IV SOLN
10.0000 mL/h | Freq: Once | INTRAVENOUS | Status: AC
  Administered 2020-01-23: 03:00:00 10 mL/h via INTRAVENOUS

## 2020-01-22 MED ORDER — LEVETIRACETAM IN NACL 1000 MG/100ML IV SOLN
1000.0000 mg | Freq: Once | INTRAVENOUS | Status: AC
  Administered 2020-01-22: 22:00:00 1000 mg via INTRAVENOUS
  Filled 2020-01-22: qty 100

## 2020-01-22 MED ORDER — PROTHROMBIN COMPLEX CONC HUMAN 500 UNITS IV KIT: 25.0000 [IU]/kg | PACK | Status: DC

## 2020-01-22 NOTE — ED Notes (Signed)
53 Pt arrives to [facility] via EMS for subdural hematoma. Per EMS, loading dose of keppra administered prior to arrival. Pt on arrival is nonverbal, eyes open spontaneously, responsive to pain.

## 2020-01-22 NOTE — ED Triage Notes (Signed)
Pt brought in by [facility] for possible code stroke. Per [employer]; pt LKW at 6pm today but other staff noticed that pt was "off" today meaning "he wasn't his normal self". Pt with hx of previous stroke and R sided hemiparesis. Pt found to be aphasic and with L sided weakness by staff after dinner.

## 2020-01-22 NOTE — ED Notes (Signed)
Date and time results received: 01/22/20 10:41 PM  Test: Hemoglobin Critical Value: 6.4  Name of Provider Notified: Madalyn Rob, MD  Orders Received? Or Actions Taken? N/a

## 2020-01-22 NOTE — Progress Notes (Signed)
[facility] TeleNeurology Consult Services  Date of Service:   01/22/2020 21:52:19  Impression:     .  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 Neurosurgery has been consulted STAT per ER physician, admit to ICU, neuro checks q1hr, load with keppra 1g IV now and start 750mg  BID  Metrics: Last Known Well: Unknown [employer] Notification Time: 01/22/2020 21:52:19 Arrival Time: 01/22/2020 21:41:00 Stamp Time: 01/22/2020 21:52:19 Time First Login Attempt: 01/22/2020 21:57:13 Symptoms: nonverbal and right sided weakness NIHSS Start Assessment Time: 01/22/2020 21:58:58 Patient is not a candidate for Alteplase/Activase. Alteplase Medical Decision: 01/22/2020 21:58:55 Patient was not deemed candidate for Alteplase/Activase thrombolytics because of Current or Previous ICH.  CT head was reviewed and results were: SDH with acute on chronic with mass effect  Clinical Presentation is not Suggestive of Large Vessel Occlusive Disease  ED Physician notified of diagnostic impression and management plan on 01/22/2020 22:11:08 Recommendation:  Diagnostic Studies:     . Repeat CT head in first 8-12hrs  Laboratory Studies:     .  INR/PT     .  aPTT?     .  CBC  Medications:     .  Hold?antiplatelet?therapy/NSAIDS/Anticoagulation     .  Load with Keppra 1gm now.     .  Keppra 500mg  bid.  Nursing Recommendations:     .  Telemetry, IV Fluids?Avoid dextrose containing fluids, Maintain euglycemia     .  Head of bed 30 degrees     .  Neuro checks q1-2?hrs?during ICU stay     .  Once stable neuro checks q4?hrs     .  BP control, lower SBP to less than 160 with goal of 140's  Consultations:     .  Need Neurosurgery  consultation?STAT     .  Recommend Speech therapy if failed dysphagia screen  DVT Prophylaxis:     .  SCDs  Disposition:     .  Neurology will sign off  ------------------------------------------------------------------------------  History of Present Illness: Patient is a 59 year old Male.  Patient was brought by EMS for symptoms of nonverbal and right sided weakness  Patient presents from a nursing facility via EMS with history of CVA with residual right sided hemiplegia and today presents with aphasia and left sided weakness. No fall or trauma. per CNA: on and off all day, not himself, trouble expressing himself, stopped answering questions. 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 Pending IVC filter placement  There is history of Recent Anticoagulants. Past Medical History:     . Hypertension     . Diabetes Mellitus     . Coronary Artery Disease     . Stroke     . There is NO history of Atrial Fibrillation  Anticoagulant use:  lovenox  Antiplatelet use: No    Examination: BP(116/70), Pulse(123), Blood Glucose(200s per EMS) 1A: Level of Consciousness - Requires repeated stimulation to arouse + 2 1B: Ask Month and Age - Aphasic + 2 1C: Blink Eyes & Squeeze Hands - Performs 1 Task + 1 2: Test Horizontal Extraocular Movements - Normal + 0 3: Test Visual Fields - No Visual Loss + 0 4: Test Facial Palsy (Use Grimace if Obtunded) - Normal symmetry +  0 5A: Test Left Arm Motor Drift - No Drift for 10 Seconds + 0 5B: Test Right Arm Motor Drift - No Movement + 4 6A: Test Left Leg Motor Drift - Some Effort Against Gravity + 2 6B: Test Right Leg Motor Drift - No Movement + 4 7: Test Limb Ataxia (FNF/Heel-Shin) - No Ataxia + 0 8: Test Sensation - Normal; No sensory loss + 0 9: Test Language/Aphasia - Severe Aphasia: Fragmentary Expression, Inference Needed, Cannot Identify Materials + 2 10: Test Dysarthria - Severe Dysarthria:  Unintelligble Slurring or Out of Proportion to Aphasia + 2 11: Test Extinction/Inattention - No abnormality + 0 NIHSS Score: 19  ICH Score: 1  GlasGow Coma Score: 13-15 (0)  Age >= 80: No (0)  ICH volume >= 30mL: Yes (+1)  Intraventricular hemorrhage: No (0)  Infratentorial origin of hemorrhage: No (0)  Pre-Morbid Modified Ranking Scale: 4 Points = Moderately severe disability; unable to walk and attend to bodily needs without assistance   Patient/Family was informed the Neurology Consult would occur via TeleHealth consult by way of interactive audio and video telecommunications and consented to receiving care in this manner. Due to the immediate potential for life-threatening deterioration due to underlying acute neurologic illness, I spent 25 minutes providing critical care. This time includes time for face to face visit via telemedicine, review of medical records, imaging studies and discussion of findings with providers, the patient and/or family.  Dr Jacqulynn Cadet Armond Cuthrell  [facility] [phone] Case [id number]

## 2020-01-22 NOTE — ED Provider Notes (Signed)
[facility] EMERGENCY DEPARTMENT Provider Note   CSN: [id number] Arrival date & time: 01/22/20  2141  An emergency department physician performed an initial assessment on this suspected stroke patient at 2142.  History Chief Complaint  Patient presents with  . Possible Stroke    Brendan [name] is a 59 y.o. male.  Past medical history CKD, CAD, diabetes, DVT.  On Lovenox.  Presents to ER with altered mental status.  Code stroke.   Per RN report from staff at [facility]: Pt brought in by [facility] for possible code stroke. Per [employer]; pt LKW at 6pm today but other staff noticed that pt was "off" today meaning "he wasn't his normal self". Pt with hx of previous stroke and R sided hemiparesis. Pt found to be aphasic and with L sided weakness by staff after dinner.  Patient unable to provide further history.  Level 5 caveat.   HPI     Past Medical History:  Diagnosis Date  . Acidosis   . Acute unilateral obstructive uropathy   . Allergic rhinitis   . Chronic kidney disease, stage IV (severe) ([place])   . Constipation   . Coronary atherosclerosis of native coronary artery   . Diabetes mellitus without complication ([place])   . DVT (deep venous thrombosis) ([place])   . Enlarged prostate with urinary obstruction   . Essential hypertension, malignant   . GERD (gastroesophageal reflux disease)   . Glaucoma   . Hemiparesis affecting right side as late effect of cerebrovascular accident ([place])   . Hemiplegia ([place])   . Hydronephrosis   . Hyperlipidemia   . Hypertension   . Hypokalemia   . Muscle weakness (generalized)   . PVD (peripheral vascular disease) ([place])   . Retroperitoneal fibrosis   . Stricture or kinking of ureter   . Stroke [facility])    right sided hemiparesis  . Vitamin D deficiency     Patient Active Problem List   Diagnosis Date Noted  . DVT (deep venous thrombosis) ([place]) 12/19/2019  . Right leg DVT ([place]) 12/18/2019  . Symptomatic anemia 03/22/2018  . History of  DVT in adulthood 03/22/2018  . Gross hematuria   . Nephrostomy tube displaced ([place]) 07/19/2017  . Pyelonephritis 02/16/2017  . 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 Coronary atherosclerosis of native coronary artery   . Essential hypertension   . Stroke ([place])   . Diabetes mellitus without complication ([place])   . Chronic kidney disease, stage IV (severe) ([place])   . GERD (gastroesophageal reflux disease)   . Glaucoma   . Hemiparesis affecting right side as late effect of cerebrovascular accident ([place])   . Enlarged prostate with urinary obstruction   . Constipation     Past Surgical History:  Procedure Laterality Date  . INTRAVASCULAR ULTRASOUND/IVUS N/A 01/13/2020   Procedure: Intravascular Ultrasound/IVUS;  Surgeon: Serafina Mitchell, MD;  Location: [place] CV [facility];  Service: Cardiovascular;  Laterality: N/A;  IJ, SVC, IVC  . IR GENERIC HISTORICAL  11/09/2016   IR NEPHROSTOMY EXCHANGE LEFT 11/09/2016 Corrie Mckusick, DO [facility]-INTERV RAD  . IR GENERIC HISTORICAL  01/09/2017   IR NEPHROSTOMY EXCHANGE LEFT 01/09/2017 [name], MD [facility]-INTERV RAD  . IR NEPHROSTOGRAM LEFT THRU EXISTING ACCESS  11/12/2019  . IR NEPHROSTOMY EXCHANGE LEFT  03/06/2017  . IR NEPHROSTOMY EXCHANGE LEFT  04/13/2017  . IR NEPHROSTOMY EXCHANGE LEFT  06/08/2017  . IR NEPHROSTOMY EXCHANGE LEFT  07/20/2017  . IR NEPHROSTOMY EXCHANGE LEFT  08/03/2017  . IR NEPHROSTOMY EXCHANGE LEFT  09/28/2017  . IR NEPHROSTOMY EXCHANGE LEFT  11/29/2017  . IR NEPHROSTOMY EXCHANGE LEFT  01/31/2018  . IR NEPHROSTOMY EXCHANGE LEFT  04/04/2018  . IR NEPHROSTOMY EXCHANGE LEFT  06/27/2018  . IR NEPHROSTOMY EXCHANGE LEFT  09/19/2018  . IR NEPHROSTOMY EXCHANGE LEFT  12/10/2018  . IR NEPHROSTOMY EXCHANGE LEFT  06/26/2019  . IR NEPHROSTOMY EXCHANGE LEFT  10/16/2019  . IVC VENOGRAPHY N/A 01/13/2020   Procedure: IVC Venography;  Surgeon: Serafina Mitchell, MD;  Location: [place] [facility];  Service: Cardiovascular;  Laterality: N/A;  . LOWER EXTREMITY VENOGRAPHY  Left 01/13/2020   Procedure: LOWER EXTREMITY VENOGRAPHY;  Surgeon: Serafina Mitchell, MD;  Location: [place] [facility];  Service: Cardiovascular;  Laterality: Left;  . NEPHROSTOMY TUBE PLACEMENT ([place] HX)         Family History  Family history unknown: Yes    Social History   Tobacco Use  . Smoking status: Never Smoker  . Smokeless tobacco: Never Used  Substance Use Topics  . Alcohol use: Not Currently  . Drug use: Not Currently    Home Medications Prior to Admission medications   Medication Sig Start Date End Date Taking? Authorizing Provider  acetaminophen (TYLENOL) 325 MG tablet Take 650 mg by mouth every 8 (eight) hours as needed for moderate pain or headache.    [provider]  aspirin EC 81 MG tablet Take 81 mg by mouth daily.    [provider]  calcium carbonate (CALTRATE 600) 1500 (600 Ca) MG TABS tablet Take 1 tablet by mouth 2 (two) times daily.     [provider]  cetaphil (CETAPHIL) lotion Apply 1 application topically at bedtime. Applied to feet/ankles    [provider]  Cholecalciferol 1000 units tablet Take 2,000 Units by mouth daily.     [provider]  enoxaparin (LOVENOX) 120 MG/0.8ML injection Inject 0.73 mLs (110 mg total) into the skin every 12 (twelve) hours. 12/22/19 01/21/20  Manuella Ghazi, Pratik D, DO  ezetimibe (ZETIA) 10 MG tablet Take 10 mg by mouth every evening.     [provider]  furosemide (LASIX) 20 MG tablet Take 20 mg by mouth daily.    [provider]  guaiFENesin (MUCINEX) 600 MG 12 hr tablet Take 600 mg by mouth 2 (two) times daily.    [provider]  loratadine (CLARITIN) 10 MG tablet Take 10 mg by mouth daily.    [provider]  mycophenolate (CELLCEPT) 500 MG tablet Take 500 mg by mouth 2 (two) times daily.    [provider]  oxyCODONE-acetaminophen (PERCOCET/ROXICET) 5-325 MG tablet Take 1 tablet by mouth every 8 (eight) hours as needed for severe pain.     [provider]  senna (SENOKOT) 8.6 MG TABS tablet Take 8.6 mg by mouth daily.     [provider]  simvastatin (ZOCOR) 40 MG tablet Take 40 mg by mouth at bedtime.    [provider]  sodium bicarbonate 650 MG tablet Take 650 mg by mouth 3 (three) times daily.    [provider]  tamsulosin (FLOMAX) 0.4 MG CAPS capsule Take 0.4 mg by mouth at bedtime.    [provider]    Allergies    Other  Review of Systems   Review of Systems  Unable to perform ROS: Mental status change    Physical Exam Updated Vital Signs BP 119/65   Pulse (!) 116   Resp 16   Wt 112 kg   SpO2  94%   BMI 32.58 kg/m   Physical Exam Constitutional:      Appearance: Normal appearance.  HENT:     Head: Normocephalic and atraumatic.     Nose: Nose normal.     Mouth/Throat:     Mouth: Mucous membranes are moist.  Eyes:     Pupils: Pupils are equal, round, and reactive to light.  Cardiovascular:     Rate and Rhythm: Normal rate.     Pulses: Normal pulses.     Heart sounds: Normal heart sounds.  Pulmonary:     Effort: Pulmonary effort is normal.     Breath sounds: Normal breath sounds.  Abdominal:     General: Abdomen is flat.     Palpations: Abdomen is soft.  Musculoskeletal:        General: No tenderness or deformity.  Skin:    General: Skin is warm.     Capillary Refill: Capillary refill takes less than 2 seconds.     Coloration: Skin is not jaundiced.  Neurological:     Mental Status: He is alert.     Comments: Alert, oriented to person, but not place or time, follows commands Right side baseline defecits Left arm, left leg strength grossly intact     ED Results / Procedures / Treatments   Labs (all labs ordered are listed, but only abnormal results are displayed) Labs Reviewed  PROTIME-INR - Abnormal; Notable for the following components:      Result Value   Prothrombin Time 15.3 (*)    All other components within normal limits  APTT -  Abnormal; Notable for the following components:   aPTT 48 (*)    All other components within normal limits  CBC - Abnormal; Notable for the following components:   RBC 2.35 (*)    Hemoglobin 6.4 (*)    HCT 22.2 (*)    MCHC 28.8 (*)    RDW 17.4 (*)    nRBC 1.8 (*)    All other components within normal limits  DIFFERENTIAL - Abnormal; Notable for the following components:   Neutro Abs 7.9 (*)    Abs Immature Granulocytes 0.25 (*)    All other components within normal limits  COMPREHENSIVE METABOLIC PANEL - Abnormal; Notable for the following components:   Sodium 134 (*)    Glucose, Bld 171 (*)    BUN 64 (*)    Creatinine, Ser 3.38 (*)    Calcium 8.7 (*)    Albumin 3.4 (*)    AST 57 (*)    ALT 88 (*)    GFR calc non Af Amer 19 (*)    GFR calc Af Amer 22 (*)    All other components within normal limits  CBG MONITORING, ED - Abnormal; Notable for the following components:   Glucose-Capillary 148 (*)    All other components within normal limits  ETHANOL  RAPID URINE DRUG SCREEN, HOSP PERFORMED  URINALYSIS, ROUTINE W REFLEX MICROSCOPIC  I-STAT CHEM 8, ED    EKG None  Radiology CT HEAD CODE STROKE WO CONTRAST`  Result Date: 01/22/2020 CLINICAL DATA:  Code stroke. 59 year old male with difficulty speaking and altered mental status. EXAM: CT HEAD WITHOUT CONTRAST TECHNIQUE: Contiguous axial images were obtained from the base of the skull through the vertex without intravenous contrast. COM[facility]:  [facility] 07/17/2016. FINDINGS: Brain: Lobulated mixed density broad-based left side subdural hematoma, new since 2017. This measures up to 19 mm in thickness and spans most of the  left hemisphere. However, there is only mild rightward midline shift of 4 mm, with patchy chronic left MCA territory encephalomalacia superimposed. Basilar cisterns remain patent. No ventriculomegaly. Gray-white matter differentiation in the right hemisphere and posterior fossa appears to  remain normal. Vascular: No suspicious intracranial vascular hyperdensity. Skull: Calvarium intact. Sinuses/Orbits: Chronic sinusitis with some progression in the ethmoids since 2017. Tympanic cavities and mastoids remain clear. Other: No acute orbit or scalp soft tissue finding. ASPECTS [facility] Stroke Program Early CT Score) Total score (0-10 with 10 being normal): Not applicable, acute hemorrhage. IMPRESSION: 1. Large mixed density Left Side Subdural Hematoma, up to 19 mm in thickness. 2. Superimposed chronic left MCA territory encephalomalacia, with only mild rightward midline shift of 4 mm at this time. Critical Value/emergent results were called by telephone at the time of interpretation on 01/22/2020 at 9:58 pm to Dr. Madalyn Rob , who verbally acknowledged these results. Electronically Signed   By: Genevie Ann M.D.   On: 01/22/2020 22:00    Procedures Procedures (including critical care time)  Medications Ordered in ED Medications  protamine injection 50 mg (50 mg Intravenous Given 01/22/20 2219)  levETIRAcetam (KEPPRA) IVPB 1000 mg/100 mL premix (0 mg Intravenous Stopped 01/22/20 2246)    ED Course  I have reviewed the triage vital signs and the nursing notes.  Pertinent labs & imaging results that were available during my care of the patient were reviewed by me and considered in my medical decision making (see chart for details).  Clinical Course as of Jan 21 2305  Thu Jan 22, 2020  2213 Discussed with pharmacy again - only need protamine, no role for Kcentra   [RD]  2217 D/w [place], recommends [facility] transfer, agrees with reversal; he will see patient and review scans when he gets to [facility]   [RD]    Clinical Course User Index [RD] Lucrezia Starch, MD   MDM Rules/Calculators/A&P                      59 year old male notable history for CVA, baseline right-sided deficits presenting to ER with sudden onset aphasia, left-sided weakness.  CT head concerning for mixed density subdural  hematoma with acute component.  On Lovenox for extensive DVT.  Our nursing staff confirmed received Lovenox dose at 9 PM.  Discussed with pharmacy, gave protamine for reversal.  Discussed with neurosurgery who agreed with transfer to [facility].  Neurology via teleneurology saw patient, recommended loading with Keppra.  Patient was transferred emergently to [facility] for further management.  Airway intact.  BP in reasonable range - no hypertension.   Dr. Langston Masker at [facility] ER accepting.  Final Clinical Impression(s) / ED Diagnoses Final diagnoses:  SDH (subdural hematoma) ([place])  Current use of long term anticoagulation    Rx / DC Orders ED Discharge Orders    None       Lucrezia Starch, MD 01/22/20 2306

## 2020-01-22 NOTE — ED Notes (Signed)
Called Dr Arneta Cliche to Dr Trilby Drummer

## 2020-01-22 NOTE — ED Provider Notes (Addendum)
Physical Exam  BP 109/79 (BP Location: Left Arm)   Pulse (!) 120   Resp 20   Wt 112 kg   SpO2 100%   BMI 32.58 kg/m   Physical Exam  ED Course/Procedures   Clinical Course as of Jan 21 2339  Thu Jan 22, 2020  2213 Discussed with pharmacy again - only need protamine, no role for Kcentra   [RD]  2217 D/w [place], recommends [facility] transfer, agrees with reversal; he will see patient and review scans when he gets to [facility]   [RD]    Clinical Course User Index [RD] Lucrezia Starch, MD    .Critical Care Performed by: Varney Biles, MD Authorized by: Varney Biles, MD   Critical care provider statement:    Critical care time (minutes):  35   Critical care was necessary to treat or prevent imminent or life-threatening deterioration of the following conditions:  CNS failure or compromise   Critical care was time spent personally by me on the following activities:  Discussions with consultants, evaluation of patient's response to treatment, examination of patient, ordering and performing treatments and interventions, ordering and review of laboratory studies, ordering and review of radiographic studies, pulse oximetry, re-evaluation of patient's condition, obtaining history from patient or surrogate and review of old charts    MDM    Assuming care of Mr. Dinh at [facility] emergency room. Patient is being transferred from [facility] after having been noted to have a subdural hematoma on the left side measuring up to 19 mm in thickness with mild right-sided midline shift.  It appears that patient resides at a nursing facility. He has history of DVT with DOAC failure, and is on Lovenox. He also has history of hematuria, CKD.  Per EMS, patient was noted to have change in mental status all day long. His symptoms progressed in the evening. He had received his last Lovenox late in the evening as well. Once patient's mental status deteriorated further, he was transferred to any pain  hospital as a code stroke, where the subdural hematoma was noted.  Dr. Roslynn Amble discussed the case with Dr. Cyndy Freeze and patient was advised to come to the ER at [facility].  Prior to leaving any pain emergency department he had received 50 mg of protamine.  On our evaluation patient is not responding to any verbal stimuli. GCS - 9 (2/1/6). Pupils are 2 mm and equal. Patient is noted to have right more than left-sided weakness. It appears that his prior stroke had led to right-sided deficit.  Patient's blood pressure is 109/80, pulse 120. He is protecting his airway. He is noted to have blood in his Foley catheter. Hemoglobin is 6.4, acute drop from 9.99 days ago and 13.6 - 77-month ago.  Discussed case with pharmacy to see if patient is eligible for more protamine and any other factors. They recommend and have ordered 50 mg more of protamine.  Dr. Cyndy Freeze is in the operating room and he had nursing staff call us back. He has been made aware that the patient is in the ER and also hemoglobin is at 6.4, with transfusion goal to get his hemoglobin over 8. Per OR nurse, Dr. Cyndy Freeze is aware  Pt has 1 x 18 g iv. Cr is > 3.  The rationale for transfusion is to ensure that patient has optimal perfusion/oxygen delivery to the brain tissue. Per EMS patient had one blood pressure in the 90s. They have started fluid transfusion. BP currently over 100  systolic. Will monitor closely.   12:50 AM Unchanged neuro exam. Dr. Cyndy Freeze has assessed the patient.    Varney Biles, MD 01/23/20 0051   1:11 AM IV team unable to secure an access. Patient is not cooperative with ultrasound-guided IV placement.  Unfortunately he has deep available brachial access in the left upper extremity.  Patient is not a good candidate for EJ given he is not cooperative at this time.  I discussed the case with pharmacy and they said that the second round of 50 milligrams of protamine is not needed if the bleeding is not  getting worse.  Therefore we will not pursue IV at this time.  The other IV is functioning but it gives resistance.  If patient is going to the OR then it would be best for him to get better access whilst in there.   Varney Biles, MD 01/23/20 0113   2:44 AM Patient did spike a fever.  It is unclear if this is a new fever or he was febrile when you first arrived to any pain hospital. Given that he has nephrostomy tube in place we will start him on cefepime Nursing staff advised to start antibiotics even if we do not get blood cultures to prevent delay in treatment.  Dr. Cyndy Freeze recommends ICU admission.  I just spoke with critical care doctor and they will admit the patient.   Varney Biles, MD 01/23/20 [phone]

## 2020-01-22 NOTE — ED Notes (Signed)
Staff at [facility] confirmed that pt's last dose of Lovenox was just prior to EMS arrival.

## 2020-01-23 ENCOUNTER — Other Ambulatory Visit: Payer: Self-pay

## 2020-01-23 ENCOUNTER — Inpatient Hospital Stay (HOSPITAL_COMMUNITY): Payer: Medicaid Other

## 2020-01-23 DIAGNOSIS — R630 Anorexia: Secondary | ICD-10-CM | POA: Diagnosis not present

## 2020-01-23 DIAGNOSIS — I824Y1 Acute embolism and thrombosis of unspecified deep veins of right proximal lower extremity: Secondary | ICD-10-CM | POA: Diagnosis not present

## 2020-01-23 DIAGNOSIS — Z86718 Personal history of other venous thrombosis and embolism: Secondary | ICD-10-CM | POA: Diagnosis not present

## 2020-01-23 DIAGNOSIS — K219 Gastro-esophageal reflux disease without esophagitis: Secondary | ICD-10-CM | POA: Diagnosis present

## 2020-01-23 DIAGNOSIS — Z7982 Long term (current) use of aspirin: Secondary | ICD-10-CM | POA: Diagnosis not present

## 2020-01-23 DIAGNOSIS — R4182 Altered mental status, unspecified: Secondary | ICD-10-CM | POA: Diagnosis not present

## 2020-01-23 DIAGNOSIS — N135 Crossing vessel and stricture of ureter without hydronephrosis: Secondary | ICD-10-CM | POA: Diagnosis not present

## 2020-01-23 DIAGNOSIS — I82411 Acute embolism and thrombosis of right femoral vein: Secondary | ICD-10-CM | POA: Diagnosis present

## 2020-01-23 DIAGNOSIS — S065XAA Traumatic subdural hemorrhage with loss of consciousness status unknown, initial encounter: Secondary | ICD-10-CM

## 2020-01-23 DIAGNOSIS — N136 Pyonephrosis: Secondary | ICD-10-CM | POA: Diagnosis present

## 2020-01-23 DIAGNOSIS — E1151 Type 2 diabetes mellitus with diabetic peripheral angiopathy without gangrene: Secondary | ICD-10-CM | POA: Diagnosis present

## 2020-01-23 DIAGNOSIS — N400 Enlarged prostate without lower urinary tract symptoms: Secondary | ICD-10-CM | POA: Diagnosis present

## 2020-01-23 DIAGNOSIS — D62 Acute posthemorrhagic anemia: Secondary | ICD-10-CM | POA: Diagnosis not present

## 2020-01-23 DIAGNOSIS — R609 Edema, unspecified: Secondary | ICD-10-CM | POA: Diagnosis not present

## 2020-01-23 DIAGNOSIS — Z6832 Body mass index (BMI) 32.0-32.9, adult: Secondary | ICD-10-CM | POA: Diagnosis not present

## 2020-01-23 DIAGNOSIS — I8290 Acute embolism and thrombosis of unspecified vein: Secondary | ICD-10-CM | POA: Diagnosis not present

## 2020-01-23 DIAGNOSIS — S065X9A Traumatic subdural hemorrhage with loss of consciousness of unspecified duration, initial encounter: Secondary | ICD-10-CM

## 2020-01-23 DIAGNOSIS — I6200 Nontraumatic subdural hemorrhage, unspecified: Secondary | ICD-10-CM | POA: Diagnosis present

## 2020-01-23 DIAGNOSIS — R4701 Aphasia: Secondary | ICD-10-CM | POA: Diagnosis present

## 2020-01-23 DIAGNOSIS — R29719 NIHSS score 19: Secondary | ICD-10-CM | POA: Diagnosis present

## 2020-01-23 DIAGNOSIS — N184 Chronic kidney disease, stage 4 (severe): Secondary | ICD-10-CM | POA: Diagnosis present

## 2020-01-23 DIAGNOSIS — I69351 Hemiplegia and hemiparesis following cerebral infarction affecting right dominant side: Secondary | ICD-10-CM | POA: Diagnosis not present

## 2020-01-23 DIAGNOSIS — N139 Obstructive and reflux uropathy, unspecified: Secondary | ICD-10-CM | POA: Diagnosis not present

## 2020-01-23 DIAGNOSIS — I10 Essential (primary) hypertension: Secondary | ICD-10-CM | POA: Diagnosis not present

## 2020-01-23 DIAGNOSIS — E440 Moderate protein-calorie malnutrition: Secondary | ICD-10-CM | POA: Diagnosis present

## 2020-01-23 DIAGNOSIS — Z20822 Contact with and (suspected) exposure to covid-19: Secondary | ICD-10-CM | POA: Diagnosis present

## 2020-01-23 DIAGNOSIS — S065X0D Traumatic subdural hemorrhage without loss of consciousness, subsequent encounter: Secondary | ICD-10-CM | POA: Diagnosis not present

## 2020-01-23 DIAGNOSIS — Z7189 Other specified counseling: Secondary | ICD-10-CM | POA: Diagnosis not present

## 2020-01-23 DIAGNOSIS — N179 Acute kidney failure, unspecified: Secondary | ICD-10-CM | POA: Diagnosis not present

## 2020-01-23 DIAGNOSIS — Z95828 Presence of other vascular implants and grafts: Secondary | ICD-10-CM | POA: Diagnosis not present

## 2020-01-23 DIAGNOSIS — Z515 Encounter for palliative care: Secondary | ICD-10-CM | POA: Diagnosis not present

## 2020-01-23 DIAGNOSIS — Z7901 Long term (current) use of anticoagulants: Secondary | ICD-10-CM | POA: Diagnosis not present

## 2020-01-23 DIAGNOSIS — I82401 Acute embolism and thrombosis of unspecified deep veins of right lower extremity: Secondary | ICD-10-CM | POA: Diagnosis not present

## 2020-01-23 DIAGNOSIS — E1122 Type 2 diabetes mellitus with diabetic chronic kidney disease: Secondary | ICD-10-CM | POA: Diagnosis present

## 2020-01-23 DIAGNOSIS — E119 Type 2 diabetes mellitus without complications: Secondary | ICD-10-CM | POA: Diagnosis not present

## 2020-01-23 DIAGNOSIS — H409 Unspecified glaucoma: Secondary | ICD-10-CM | POA: Diagnosis present

## 2020-01-23 DIAGNOSIS — I2510 Atherosclerotic heart disease of native coronary artery without angina pectoris: Secondary | ICD-10-CM | POA: Diagnosis present

## 2020-01-23 DIAGNOSIS — I82511 Chronic embolism and thrombosis of right femoral vein: Secondary | ICD-10-CM | POA: Diagnosis not present

## 2020-01-23 DIAGNOSIS — E785 Hyperlipidemia, unspecified: Secondary | ICD-10-CM | POA: Diagnosis present

## 2020-01-23 DIAGNOSIS — G9340 Encephalopathy, unspecified: Secondary | ICD-10-CM | POA: Diagnosis not present

## 2020-01-23 DIAGNOSIS — G8194 Hemiplegia, unspecified affecting left nondominant side: Secondary | ICD-10-CM | POA: Diagnosis present

## 2020-01-23 DIAGNOSIS — G9341 Metabolic encephalopathy: Secondary | ICD-10-CM | POA: Diagnosis not present

## 2020-01-23 LAB — URINALYSIS, ROUTINE W REFLEX MICROSCOPIC

## 2020-01-23 LAB — CBC WITH DIFFERENTIAL/PLATELET
Abs Immature Granulocytes: 0.35 10*3/uL — ABNORMAL HIGH (ref 0.00–0.07)
Basophils Absolute: 0 10*3/uL (ref 0.0–0.1)
Basophils Relative: 0 %
Eosinophils Absolute: 0 10*3/uL (ref 0.0–0.5)
Eosinophils Relative: 0 %
HCT: 26.5 % — ABNORMAL LOW (ref 39.0–52.0)
Hemoglobin: 7.9 g/dL — ABNORMAL LOW (ref 13.0–17.0)
Immature Granulocytes: 3 %
Lymphocytes Relative: 5 %
Lymphs Abs: 0.5 10*3/uL — ABNORMAL LOW (ref 0.7–4.0)
MCH: 27.2 pg (ref 26.0–34.0)
MCHC: 29.8 g/dL — ABNORMAL LOW (ref 30.0–36.0)
MCV: 91.4 fL (ref 80.0–100.0)
Monocytes Absolute: 0.9 10*3/uL (ref 0.1–1.0)
Monocytes Relative: 8 %
Neutro Abs: 9.7 10*3/uL — ABNORMAL HIGH (ref 1.7–7.7)
Neutrophils Relative %: 84 %
Platelets: 319 10*3/uL (ref 150–400)
RBC: 2.9 MIL/uL — ABNORMAL LOW (ref 4.22–5.81)
RDW: 18 % — ABNORMAL HIGH (ref 11.5–15.5)
WBC: 11.5 10*3/uL — ABNORMAL HIGH (ref 4.0–10.5)
nRBC: 1 % — ABNORMAL HIGH (ref 0.0–0.2)

## 2020-01-23 LAB — BASIC METABOLIC PANEL
Anion gap: 12 (ref 5–15)
BUN: 55 mg/dL — ABNORMAL HIGH (ref 6–20)
CO2: 20 mmol/L — ABNORMAL LOW (ref 22–32)
Calcium: 8.6 mg/dL — ABNORMAL LOW (ref 8.9–10.3)
Chloride: 106 mmol/L (ref 98–111)
Creatinine, Ser: 3.11 mg/dL — ABNORMAL HIGH (ref 0.61–1.24)
GFR calc Af Amer: 24 mL/min — ABNORMAL LOW (ref >60)
GFR calc non Af Amer: 21 mL/min — ABNORMAL LOW (ref >60)
Glucose, Bld: 156 mg/dL — ABNORMAL HIGH (ref 70–99)
Potassium: 4.6 mmol/L (ref 3.5–5.1)
Sodium: 138 mmol/L (ref 135–145)

## 2020-01-23 LAB — URINALYSIS, MICROSCOPIC (REFLEX): RBC / HPF: >50 RBC/hpf (ref 0–5)

## 2020-01-23 LAB — RESPIRATORY PANEL BY RT PCR (FLU A&B, COVID)
Influenza A by PCR: NEGATIVE
Influenza B by PCR: NEGATIVE
SARS Coronavirus 2 by RT PCR: NEGATIVE

## 2020-01-23 LAB — URINE CULTURE

## 2020-01-23 LAB — MRSA PCR SCREENING: MRSA by PCR: POSITIVE — AB

## 2020-01-23 LAB — RAPID URINE DRUG SCREEN, HOSP PERFORMED
Amphetamines: NOT DETECTED
Barbiturates: NOT DETECTED
Benzodiazepines: NOT DETECTED
Cocaine: NOT DETECTED
Opiates: POSITIVE — AB
Tetrahydrocannabinol: NOT DETECTED

## 2020-01-23 LAB — GLUCOSE, CAPILLARY
Glucose-Capillary: 136 mg/dL — ABNORMAL HIGH (ref 70–99)
Glucose-Capillary: 171 mg/dL — ABNORMAL HIGH (ref 70–99)
Glucose-Capillary: 139 mg/dL — ABNORMAL HIGH (ref 70–99)

## 2020-01-23 LAB — CBC
HCT: 25.7 % — ABNORMAL LOW (ref 39.0–52.0)
Hemoglobin: 8 g/dL — ABNORMAL LOW (ref 13.0–17.0)
MCH: 28 pg (ref 26.0–34.0)
MCHC: 31.1 g/dL (ref 30.0–36.0)
MCV: 89.9 fL (ref 80.0–100.0)
Platelets: 310 10*3/uL (ref 150–400)
RBC: 2.86 MIL/uL — ABNORMAL LOW (ref 4.22–5.81)
RDW: 17.2 % — ABNORMAL HIGH (ref 11.5–15.5)
WBC: 10.4 10*3/uL (ref 4.0–10.5)
nRBC: 1.3 % — ABNORMAL HIGH (ref 0.0–0.2)

## 2020-01-23 LAB — DIC (DISSEMINATED INTRAVASCULAR COAGULATION)PANEL
D-Dimer, Quant: 2.7 ug/mL-FEU — ABNORMAL HIGH (ref 0.00–0.50)
Fibrinogen: 708 mg/dL — ABNORMAL HIGH (ref 210–475)
INR: 1.2 (ref 0.8–1.2)
Platelets: 386 10*3/uL (ref 150–400)
Prothrombin Time: 15.4 seconds — ABNORMAL HIGH (ref 11.4–15.2)
Smear Review: NONE SEEN
aPTT: 40 seconds — ABNORMAL HIGH (ref 24–36)

## 2020-01-23 LAB — LACTIC ACID, PLASMA
Lactic Acid, Venous: 1 mmol/L (ref 0.5–1.9)
Lactic Acid, Venous: 2.6 mmol/L (ref 0.5–1.9)

## 2020-01-23 LAB — HEMOGLOBIN A1C
Hgb A1c MFr Bld: 5.2 % (ref 4.8–5.6)
Mean Plasma Glucose: 102.54 mg/dL

## 2020-01-23 LAB — HEPARIN ANTI-XA: Heparin LMW: 1.59 IU/mL

## 2020-01-23 LAB — PREPARE RBC (CROSSMATCH)

## 2020-01-23 LAB — ABO/RH: ABO/RH(D): A POS

## 2020-01-23 MED ORDER — INSULIN ASPART 100 UNIT/ML ~~LOC~~ SOLN: 0.0000 [IU] | Freq: Three times a day (TID) | SUBCUTANEOUS | Status: DC

## 2020-01-23 MED ORDER — INSULIN ASPART 100 UNIT/ML ~~LOC~~ SOLN
0.0000 [IU] | SUBCUTANEOUS | Status: DC
  Administered 2020-01-23 – 2020-01-28 (×10): 1 [IU] via SUBCUTANEOUS
  Administered 2020-01-28: 12:00:00 2 [IU] via SUBCUTANEOUS
  Administered 2020-01-29 – 2020-01-30 (×6): 1 [IU] via SUBCUTANEOUS
  Administered 2020-01-30: 21:00:00 5 [IU] via SUBCUTANEOUS
  Administered 2020-01-30: 1 [IU] via SUBCUTANEOUS
  Administered 2020-01-30: 2 [IU] via SUBCUTANEOUS
  Administered 2020-01-30: 04:00:00 1 [IU] via SUBCUTANEOUS
  Administered 2020-01-31: 2 [IU] via SUBCUTANEOUS
  Administered 2020-01-31: 12:00:00 1 [IU] via SUBCUTANEOUS
  Administered 2020-01-31: 05:00:00 2 [IU] via SUBCUTANEOUS
  Administered 2020-01-31: 08:00:00 1 [IU] via SUBCUTANEOUS

## 2020-01-23 MED ORDER — MUPIROCIN 2 % EX OINT
1.0000 "application " | TOPICAL_OINTMENT | Freq: Two times a day (BID) | CUTANEOUS | Status: AC
  Administered 2020-01-23 – 2020-01-28 (×10): 1 via NASAL
  Filled 2020-01-23: qty 22

## 2020-01-23 MED ORDER — SODIUM CHLORIDE 0.9 % BOLUS PEDS: 500.0000 mL | Freq: Once | INTRAVENOUS | Status: DC

## 2020-01-23 MED ORDER — CHLORHEXIDINE GLUCONATE CLOTH 2 % EX PADS
6.0000 | MEDICATED_PAD | Freq: Every day | CUTANEOUS | Status: DC
  Administered 2020-01-23 – 2020-02-09 (×18): 6 via TOPICAL

## 2020-01-23 MED ORDER — SODIUM CHLORIDE 0.9 % IV BOLUS
1000.0000 mL | Freq: Once | INTRAVENOUS | Status: AC
  Administered 2020-01-23: 04:00:00 1000 mL via INTRAVENOUS

## 2020-01-23 MED ORDER — ORAL CARE MOUTH RINSE
15.0000 mL | Freq: Two times a day (BID) | OROMUCOSAL | Status: DC
  Administered 2020-01-23 – 2020-02-13 (×37): 15 mL via OROMUCOSAL

## 2020-01-23 MED ORDER — SODIUM CHLORIDE 0.9 % IV BOLUS
500.0000 mL | Freq: Once | INTRAVENOUS | Status: AC
  Administered 2020-01-23: 18:00:00 500 mL via INTRAVENOUS

## 2020-01-23 MED ORDER — ACETAMINOPHEN 160 MG/5ML PO SOLN
325.0000 mg | ORAL | Status: DC | PRN
  Administered 2020-01-23 – 2020-01-24 (×3): 325 mg via ORAL
  Filled 2020-01-23 (×3): qty 20.3

## 2020-01-23 MED ORDER — FAMOTIDINE IN NACL 20-0.9 MG/50ML-% IV SOLN
20.0000 mg | INTRAVENOUS | Status: DC
  Administered 2020-01-23 – 2020-01-27 (×4): 20 mg via INTRAVENOUS
  Filled 2020-01-23 (×4): qty 50

## 2020-01-23 MED ORDER — LACTATED RINGERS IV SOLN: INTRAVENOUS | Status: DC

## 2020-01-23 MED ORDER — SODIUM CHLORIDE 0.9 % IV SOLN: 2.0000 g | Freq: Once | INTRAVENOUS | Status: DC

## 2020-01-23 MED ORDER — ACETAMINOPHEN 650 MG RE SUPP
650.0000 mg | Freq: Once | RECTAL | Status: AC
  Administered 2020-01-23: 03:00:00 650 mg via RECTAL
  Filled 2020-01-23: qty 1

## 2020-01-23 MED ORDER — SODIUM CHLORIDE 0.9 % IV SOLN: INTRAVENOUS | Status: DC | PRN

## 2020-01-23 MED ORDER — SODIUM CHLORIDE 0.9 % IV SOLN
2.0000 g | Freq: Two times a day (BID) | INTRAVENOUS | Status: DC
  Administered 2020-01-23 – 2020-01-25 (×5): 2 g via INTRAVENOUS
  Filled 2020-01-23 (×6): qty 2

## 2020-01-23 MED ORDER — HYDRALAZINE HCL 20 MG/ML IJ SOLN: 10.0000 mg | INTRAMUSCULAR | Status: DC | PRN

## 2020-01-23 NOTE — H&P (Signed)
NAME:  Brendan [name], MRN:  [id number], DOB:  Jul 05, 1961, LOS: 0 ADMISSION DATE:  01/22/2020, CONSULTATION DATE:  01/23/2020 REFERRING MD:  Kathrynn Humble - MCED, CHIEF COMPLAINT: SDH   HPI/course in hospital  59 year old man with acute on chronic subdural hematoma.  Sent to [facility] from SNF where he was felt to have worsening left sided weakness and aphasia starting at 1800 on 2/18 but had been noted to not be himself earlier that day.  Also noted to have new hematuria in context of know obstructive uropathy with chronic nephrostomy tube.   Seen by Neurosurgery. Lovenox given for DVT was reversed with protamine. Transfused for HB 6.4 (9 one week ago).   Plan to go for evacuation on 2/19   Past Medical History   Past Medical History:  Diagnosis Date  . Acidosis   . Acute unilateral obstructive uropathy   . Allergic rhinitis   . Chronic kidney disease, stage IV (severe) ([place])   . Constipation   . Coronary atherosclerosis of native coronary artery   . Diabetes mellitus without complication ([place])   . DVT (deep venous thrombosis) ([place])   . Enlarged prostate with urinary obstruction   . Essential hypertension, malignant   . GERD (gastroesophageal reflux disease)   . Glaucoma   . Hemiparesis affecting right side as late effect of cerebrovascular accident ([place])   . Hemiplegia ([place])   . Hydronephrosis   . Hyperlipidemia   . Hypertension   . Hypokalemia   . Muscle weakness (generalized)   . PVD (peripheral vascular disease) ([place])   . Retroperitoneal fibrosis   . Stricture or kinking of ureter   . Stroke [facility])    right sided hemiparesis  . Vitamin D deficiency      Past Surgical History:  Procedure Laterality Date  . INTRAVASCULAR ULTRASOUND/IVUS N/A 01/13/2020   Procedure: Intravascular Ultrasound/IVUS;  Surgeon: Serafina Mitchell, MD;  Location: [place] [facility];  Service: Cardiovascular;  Laterality: N/A;  IJ, SVC, IVC  . IR GENERIC HISTORICAL  11/09/2016   IR NEPHROSTOMY EXCHANGE LEFT  11/09/2016 Corrie Mckusick, DO [facility]-INTERV RAD  . IR GENERIC HISTORICAL  01/09/2017   IR NEPHROSTOMY EXCHANGE LEFT 01/09/2017 [name], MD [facility]-INTERV RAD  . IR NEPHROSTOGRAM LEFT THRU EXISTING ACCESS  11/12/2019  . IR NEPHROSTOMY EXCHANGE LEFT  03/06/2017  . IR NEPHROSTOMY EXCHANGE LEFT  04/13/2017  . IR NEPHROSTOMY EXCHANGE LEFT  06/08/2017  . IR NEPHROSTOMY EXCHANGE LEFT  07/20/2017  . IR NEPHROSTOMY EXCHANGE LEFT  08/03/2017  . IR NEPHROSTOMY EXCHANGE LEFT  09/28/2017  . IR NEPHROSTOMY EXCHANGE LEFT  11/29/2017  . IR NEPHROSTOMY EXCHANGE LEFT  01/31/2018  . IR NEPHROSTOMY EXCHANGE LEFT  04/04/2018  . IR NEPHROSTOMY EXCHANGE LEFT  06/27/2018  . IR NEPHROSTOMY EXCHANGE LEFT  09/19/2018  . IR NEPHROSTOMY EXCHANGE LEFT  12/10/2018  . IR NEPHROSTOMY EXCHANGE LEFT  06/26/2019  . IR NEPHROSTOMY EXCHANGE LEFT  10/16/2019  . IVC VENOGRAPHY N/A 01/13/2020   Procedure: IVC Venography;  Surgeon: Serafina Mitchell, MD;  Location: [place] [facility];  Service: Cardiovascular;  Laterality: N/A;  . LOWER EXTREMITY VENOGRAPHY Left 01/13/2020   Procedure: LOWER EXTREMITY VENOGRAPHY;  Surgeon: Serafina Mitchell, MD;  Location: [place] [facility];  Service: Cardiovascular;  Laterality: Left;  . NEPHROSTOMY TUBE PLACEMENT ([place] HX)       Review of Systems:   Review of Systems  Unable to perform ROS: Mental acuity    Social History   reports that  he has never smoked. He has never used smokeless tobacco. He reports previous alcohol use. He reports previous drug use.   Family History   His Family history is unknown by patient.   Allergies Allergies  Allergen Reactions  . Other     TB serum-reaction unknown. Provided via [facility] records      Home Medications  Prior to Admission medications   Medication Sig Start Date End Date Taking? Authorizing Provider  acetaminophen (TYLENOL) 325 MG tablet Take 650 mg by mouth every 8 (eight) hours as needed for moderate pain or headache.    [provider]  aspirin EC 81 MG  tablet Take 81 mg by mouth daily.    [provider]  calcium carbonate (CALTRATE 600) 1500 (600 Ca) MG TABS tablet Take 1 tablet by mouth 2 (two) times daily.     [provider]  cetaphil (CETAPHIL) lotion Apply 1 application topically at bedtime. Applied to feet/ankles    [provider]  Cholecalciferol 1000 units tablet Take 2,000 Units by mouth daily.     [provider]  enoxaparin (LOVENOX) 120 MG/0.8ML injection Inject 0.73 mLs (110 mg total) into the skin every 12 (twelve) hours. 12/22/19 01/21/20  Manuella Ghazi, Pratik D, DO  ezetimibe (ZETIA) 10 MG tablet Take 10 mg by mouth every evening.     [provider]  furosemide (LASIX) 20 MG tablet Take 20 mg by mouth daily.    [provider]  guaiFENesin (MUCINEX) 600 MG 12 hr tablet Take 600 mg by mouth 2 (two) times daily.    [provider]  loratadine (CLARITIN) 10 MG tablet Take 10 mg by mouth daily.    [provider]  mycophenolate (CELLCEPT) 500 MG tablet Take 500 mg by mouth 2 (two) times daily.    [provider]  oxyCODONE-acetaminophen (PERCOCET/ROXICET) 5-325 MG tablet Take 1 tablet by mouth every 8 (eight) hours as needed for severe pain.    [provider]  senna (SENOKOT) 8.6 MG TABS tablet Take 8.6 mg by mouth daily.     [provider]  simvastatin (ZOCOR) 40 MG tablet Take 40 mg by mouth at bedtime.    [provider]  sodium bicarbonate 650 MG tablet Take 650 mg by mouth 3 (three) times daily.    [provider]  tamsulosin (FLOMAX) 0.4 MG CAPS capsule Take 0.4 mg by mouth at bedtime.    [provider]     Interim history/subjective:  Patient remains non-verbal  Objective   Blood pressure 114/72, pulse (!) 110, temperature 99 F (37.2 C), temperature source Oral, resp. rate 17, weight 112 kg, SpO2 100 %.        Intake/Output Summary (Last 24 hours) at 01/23/2020 0409 Last data filed at 01/23/2020  0314 Gross per 24 hour  Intake --  Output 400 ml  Net -400 ml   Filed Weights   01/22/20 2158  Weight: 112 kg    Examination: Physical Exam Constitutional:      General: He is not in acute distress.    Appearance: He is obese.  HENT:     Head: Normocephalic and atraumatic.     Mouth/Throat:     Mouth: Mucous membranes are moist.  Eyes:     General: No scleral icterus.    Conjunctiva/sclera: Conjunctivae normal.     Comments: Opens eyes to voice and will track for a short time.  Cardiovascular:     Rate and Rhythm: Regular rhythm. Bradycardia  present.     Pulses: Normal pulses.     Heart sounds: Gallop (S4) present.   Pulmonary:     Effort: Pulmonary effort is normal.     Breath sounds: Normal breath sounds.  Abdominal:     General: Abdomen is flat.     Palpations: Abdomen is soft.  Genitourinary:    Comments: Gross hematuria from L nephrostomy tube Musculoskeletal:        General: Swelling present.     Cervical back: No rigidity.     Right lower leg: Edema present.     Left lower leg: No edema.  Skin:    General: Skin is dry.  Neurological:     Comments: Non-verbal and somnolent. Hemiplegic on the right side. Moves left side spontaneously but not to command      Ancillary tests (personally reviewed)  CBC: Recent Labs  Lab 01/22/20 2213 01/23/20 0235  WBC 9.7  --   NEUTROABS 7.9*  --   HGB 6.4*  --   HCT 22.2*  --   MCV 94.5  --   PLT 376 [id number]    Basic Metabolic Panel: Recent Labs  Lab 01/22/20 2213  NA 134*  K 4.9  CL 99  CO2 22  GLUCOSE 171*  BUN 64*  CREATININE 3.38*  CALCIUM 8.7*   GFR: Estimated Creatinine Clearance: 30.5 mL/min (A) (by C-G formula based on SCr of 3.38 mg/dL (H)). Recent Labs  Lab 01/22/20 2213  WBC 9.7    Liver Function Tests: Recent Labs  Lab 01/22/20 2213  AST 57*  ALT 88*  ALKPHOS 70  BILITOT 0.7  PROT 7.6  ALBUMIN 3.4*   No results for input(s): LIPASE, AMYLASE in the last 168 hours. No results for  input(s): AMMONIA in the last 168 hours.  ABG    Component Value Date/Time   TCO2 25 01/13/2020 1007     Coagulation Profile: Recent Labs  Lab 01/22/20 2213 01/23/20 0235  INR 1.2 1.2    Cardiac Enzymes: No results for input(s): CKTOTAL, CKMB, CKMBINDEX, TROPONINI in the last 168 hours.  HbA1C: No results found for: HGBA1C  CBG: Recent Labs  Lab 01/22/20 2157  GLUCAP 148*   CT head 2/18: left sided fronto-parietal  2cm mixed density subdural collection consistent with acute on chronic SDH with minimal midline shift. Hypodensity of left basal ganglia consistent with prior CVA (personal review)  Assessment & Plan:   Acute on chronic subdural hematoma with altered mental status and subsequent aphasia consistent with location of hematoma. Small degree of shift may explain left sided weakness.  - Reverse anticoagulation - NPO for OR - Neurovitals q1h - No seizure prophylaxis.  Acute vs chronic hematuria. Likely related to anticoagulation. Chronic nephrostomy tubes - Nephrostogram in am to confirm tube position.  Multifactorial anemia due to acute/chronic blood loss and chronic renal disease.  - Transfuse to keep HB>7.0  DVT right leg S/P IVC filter placement and treatment with enoxaparin due to Xarelto failure. - Anticoagulation on hold due to bleed. - IVC filter in place  - Resume systemic anticoagulation per neurosurgery, likely in 30 days.  Stage IV renal disease - Resume home medications post operatively.  - May require Cortrak feeding tube.  Single fever episode, no leukocytosis. - Continue empiric antibiotics pending negative cultures.  Daily Goals Checklist  Pain/Anxiety/Delirium protocol (if indicated): hold all sedatives. VAP protocol (if indicated): not intubated. Respiratory support goals: O2 to keep sat>90% Blood pressure target: SBP 90-150 DVT prophylaxis: SCD's only  Nutritional status and feeding goals: Moderate nutritional risk. Anticipate  impaired swallowing - place Cortrak GI prophylaxis: not indicated. Fluid status goals: allow autoregulation for now. Resume home diuretics post operatively Urinary catheter: none present. Central lines: PIV Glucose control: phase 1 control. T2DM on no home treatment. Mobility/therapy needs: bedrest Antibiotic de-escalation: Cefepime pending cultures. Home medication reconciliation: resume post operatively once enteral access established. Daily labs: CBC, BMET Code Status: Full Family Communication: update in am. Disposition: ICU    Kipp Brood, MD [facility] ICU Physician [place]  Pager: [phone] Mobile: [phone] After hours: [phone].  01/23/2020, 4:09 AM

## 2020-01-23 NOTE — ED Notes (Signed)
Multiple unsuccessful attempts at IV. MD aware. Blood is now ready.

## 2020-01-23 NOTE — Procedures (Signed)
Cortrak  Person Inserting Tube:  Lawton Dollinger, RD Tube Type:  Cortrak - 43 inches Tube Location:  Right nare Initial Placement:  Stomach Secured by: Bridle Technique Used to Measure Tube Placement:  Documented cm marking at nare/ corner of mouth Cortrak Secured At:  65 cm   No x-ray is required. RN may begin using tube.   If the tube becomes dislodged please keep the tube and contact the Cortrak team at [email or web address] (password [email or web address]) for replacement.  If after hours and replacement cannot be delayed, place a NG tube and confirm placement with an abdominal x-ray.    Terecia Plaut RD, LDN Clinical Nutrition Pager listed in AMION    

## 2020-01-23 NOTE — Progress Notes (Signed)
CODE STROKE  Call time  935 pm Ems in route [place]   [phone] Started   Aris[place] called  [phone]

## 2020-01-23 NOTE — Progress Notes (Signed)
Patient with history of obstructive uropathy with chronic left PCN - request for nephrostogram due to new onset hematuria.   Patient currently unable to tolerate positioning for procedure, cannot sign consent due to change in mental status and per RN may be undergoing surgery today for SDH.   Will continue to follow and plan for nephrostogram once patient is more stable/can tolerate positioning.  Please call with questions or concerns.  Candiss Norse, PA-C

## 2020-01-23 NOTE — Consult Note (Signed)
Reason for Consult:Subdural hematoma Referring Physician: Angell, Brendan [name] is an 59 y.o. male.  HPI: who was felt to be not quite himself at his nursing home. Brought to [name] a head CT showed a subdural hematoma over the left cerebrum with acute and chronic components.Transferred to the [facility] ED for my evaluation. Multiple medical problems, including recent IVC placement secondary due DVT right lower extremity which failed with Xarelto, and gross hematuria.   Past Medical History:  Diagnosis Date  . Acidosis   . Acute unilateral obstructive uropathy   . Allergic rhinitis   . Chronic kidney disease, stage IV (severe) ([name])   . Constipation   . Coronary atherosclerosis of native coronary artery   . Diabetes mellitus without complication ([place] [name])   . DVT (deep venous thrombosis) ([place]rendan [name])   . Enlarged prostate with urinary obstruction   . Essential hypertension, malignant   . GERD (gastroesophageal reflux disease)   . Glaucoma   . Hemiparesis affecting right side as late effect of cerebrovascular accident ([place] [name])   . Hemiplegia ([place] [name])   . Hydronephrosis   . Hyperlipidemia   . Hypertension   . Hypokalemia   . Muscle weakness (generalized)   . PVD (peripheral vascular disease) ([place])   . Retroperitoneal fibrosis   . Stricture or kinking of ureter   . Stroke [name])    right sided hemiparesis  . Vitamin D deficiency     Past Surgical History:  Procedure Laterality Date  . INTRAVASCULAR ULTRASOUND/IVUS N/A 01/13/2020   Procedure: Intravascular Ultrasound/IVUS;  Surgeon: Brendan Mitchell, MD;  Location: [place] [facility];  Service: Cardiovascular;  Laterality: N/A;  IJ, SVC, IVC  . IR GENERIC HISTORICAL  11/09/2016   IR NEPHROSTOMY EXCHANGE LEFT 11/09/2016 Brendan Mckusick, DO [facility]-INTERV RAD  . IR GENERIC HISTORICAL  01/09/2017   IR NEPHROSTOMY EXCHANGE LEFT 01/09/2017 [name], MD [facility]-INTERV RAD  . IR NEPHROSTOGRAM LEFT THRU EXISTING ACCESS  11/12/2019  . IR NEPHROSTOMY EXCHANGE LEFT   03/06/2017  . IR NEPHROSTOMY EXCHANGE LEFT  04/13/2017  . IR NEPHROSTOMY EXCHANGE LEFT  06/08/2017  . IR NEPHROSTOMY EXCHANGE LEFT  07/20/2017  . IR NEPHROSTOMY EXCHANGE LEFT  08/03/2017  . IR NEPHROSTOMY EXCHANGE LEFT  09/28/2017  . IR NEPHROSTOMY EXCHANGE LEFT  11/29/2017  . IR NEPHROSTOMY EXCHANGE LEFT  01/31/2018  . IR NEPHROSTOMY EXCHANGE LEFT  04/04/2018  . IR NEPHROSTOMY EXCHANGE LEFT  06/27/2018  . IR NEPHROSTOMY EXCHANGE LEFT  09/19/2018  . IR NEPHROSTOMY EXCHANGE LEFT  12/10/2018  . IR NEPHROSTOMY EXCHANGE LEFT  06/26/2019  . IR NEPHROSTOMY EXCHANGE LEFT  10/16/2019  . IVC VENOGRAPHY N/A 01/13/2020   Procedure: IVC Venography;  Surgeon: Brendan Mitchell, MD;  Location: [place] [facility];  Service: Cardiovascular;  Laterality: N/A;  . LOWER EXTREMITY VENOGRAPHY Left 01/13/2020   Procedure: LOWER EXTREMITY VENOGRAPHY;  Surgeon: Brendan Mitchell, MD;  Location: [place] [facility];  Service: Cardiovascular;  Laterality: Left;  . NEPHROSTOMY TUBE PLACEMENT ([place] HX)      Family History  Family history unknown: Yes    Social History:  reports that he has never smoked. He has never used smokeless tobacco. He reports previous alcohol use. He reports previous drug use.  Allergies:  Allergies  Allergen Reactions  . Other     TB serum-reaction unknown. Provided via [facility] records     Medications: I have reviewed the patient's current medications.  Results for orders placed or performed during the [name] encounter of 01/22/20 (  from the past 48 hour(s))  CBG monitoring, ED     Status: Abnormal   Collection Time: 01/22/20  9:57 PM  Result Value Ref Range   Glucose-Capillary 148 (H) 70 - 99 mg/dL  Ethanol     Status: None   Collection Time: 01/22/20 10:13 PM  Result Value Ref Range   Alcohol, Ethyl (B) <10 <10 mg/dL    Comment: (NOTE) Lowest detectable limit for serum alcohol is 10 mg/dL. For medical purposes only. Performed at [facility], [address], [place], [name] [address]    Protime-INR     Status: Abnormal   Collection Time: 01/22/20 10:13 PM  Result Value Ref Range   Prothrombin Time 15.3 (H) 11.4 - 15.2 seconds   INR 1.2 0.8 - 1.2    Comment: (NOTE) INR goal varies based on device and disease states. Performed at [name], [address], [place], [place] [address]   APTT     Status: Abnormal   Collection Time: 01/22/20 10:13 PM  Result Value Ref Range   aPTT 48 (H) 24 - 36 seconds    Comment:        IF BASELINE aPTT IS ELEVATED, SUGGEST PATIENT RISK ASSESSMENT BE USED TO DETERMINE APPROPRIATE ANTICOAGULANT THERAPY. Performed at [name], [address], [place], [place] [address]   CBC     Status: Abnormal   Collection Time: 01/22/20 10:13 PM  Result Value Ref Range   WBC 9.7 4.0 - 10.5 K/uL   RBC 2.35 (L) 4.22 - 5.81 MIL/uL   Hemoglobin 6.4 (LL) 13.0 - 17.0 g/dL    Comment: This critical result has verified and been called to Brendan [name] by Brendan [name] on 02 18 2021 at 2238, and has been read back.    HCT 22.2 (L) 39.0 - 52.0 %   MCV 94.5 80.0 - 100.0 fL   MCH 27.2 26.0 - 34.0 pg   MCHC 28.8 (L) 30.0 - 36.0 g/dL   RDW 17.4 (H) 11.5 - 15.5 %   Platelets 376 150 - 400 K/uL   nRBC 1.8 (H) 0.0 - 0.2 %    Comment: Performed at [name], [address] [place], [place] [address]  Differential     Status: Abnormal   Collection Time: 01/22/20 10:13 PM  Result Value Ref Range   Neutrophils Relative % 81 %   Neutro Abs 7.9 (H) 1.7 - 7.7 K/uL   Lymphocytes Relative 8 %   Lymphs Abs 0.8 0.7 - 4.0 K/uL   Monocytes Relative 6 %   Monocytes Absolute 0.6 0.1 - 1.0 K/uL   Eosinophils Relative 2 %   Eosinophils Absolute 0.2 0.0 - 0.5 K/uL   Basophils Relative 0 %   Basophils Absolute 0.0 0.0 - 0.1 K/uL   Immature Granulocytes 3 %   Abs Immature Granulocytes 0.25 (H) 0.00 - 0.07 K/uL    Comment: Performed at [facility], [address], [place], [place] [address]  Comprehensive metabolic panel     Status: Abnormal   Collection Time:  01/22/20 10:13 PM  Result Value Ref Range   Sodium 134 (L) 135 - 145 mmol/L   Potassium 4.9 3.5 - 5.1 mmol/L   Chloride 99 98 - 111 mmol/L   CO2 22 22 - 32 mmol/L   Glucose, Bld 171 (H) 70 - 99 mg/dL   BUN 64 (H) 6 - 20 mg/dL   Creatinine, Ser 3.38 (H) 0.61 - 1.24 mg/dL   Calcium 8.7 (L) 8.9 - 10.3 mg/dL  Total Protein 7.6 6.5 - 8.1 g/dL   Albumin 3.4 (L) 3.5 - 5.0 g/dL   AST 57 (H) 15 - 41 U/L   ALT 88 (H) 0 - 44 U/L   Alkaline Phosphatase 70 38 - 126 U/L   Total Bilirubin 0.7 0.3 - 1.2 mg/dL   GFR calc non Af Amer 19 (L) >60 mL/min   GFR calc Af Amer 22 (L) >60 mL/min   Anion gap 13 5 - 15    Comment: Performed at [facility], [address], [name], [place] [address]  Type and screen     Status: None (Preliminary result)   Collection Time: 01/23/20 12:04 AM  Result Value Ref Range   ABO/RH(D) A POS    Antibody Screen NEG    Sample Expiration      01/26/2020,2359 Performed at [name], [place] [address] [place], [place] [address]    Unit Number [id number]    Blood Component Type RED CELLS,LR    Unit division 00    Status of Unit ALLOCATED    Transfusion Status OK TO TRANSFUSE    Crossmatch Result Compatible    Unit Number [id number]    Blood Component Type RBC, LR IRR    Unit division 00    Status of Unit ALLOCATED    Transfusion Status OK TO TRANSFUSE    Crossmatch Result Compatible    Unit Number [id number]    Blood Component Type RED CELLS,LR    Unit division 00    Status of Unit ALLOCATED    Transfusion Status OK TO TRANSFUSE    Crossmatch Result Compatible    Unit Number [id number]    Blood Component Type RED CELLS,LR    Unit division 00    Status of Unit ALLOCATED    Transfusion Status OK TO TRANSFUSE    Crossmatch Result Compatible   Prepare RBC     Status: None   Collection Time: 01/23/20 12:04 AM  Result Value Ref Range   Order Confirmation      ORDER PROCESSED BY BLOOD BANK Performed at [name], [place] [address] [place], [place] [address]   ABO/Rh     Status: None (Preliminary result)   Collection Time: 01/23/20 12:04 AM  Result Value Ref Range   ABO/RH(D)      A POS Performed at [place] [address], [place],  [address]     CT HEAD CODE STROKE WO CONTRAST`  Result Date: 01/22/2020 CLINICAL DATA:  Code stroke. 59 year old male with difficulty speaking and altered mental status. EXAM: CT HEAD WITHOUT CONTRAST TECHNIQUE: Contiguous axial images were obtained from the base of the skull through the vertex without intravenous contrast. COMPARISON:  [name] head CT 07/17/2016. FINDINGS: Brain: Lobulated mixed density broad-based left side subdural hematoma, new since 2017. This measures up to 19 mm in thickness and spans most of the left hemisphere. However, there is only mild rightward midline shift of 4 mm, with patchy chronic left MCA territory encephalomalacia superimposed. Basilar cisterns remain patent. No ventriculomegaly. Gray-white matter differentiation in the right hemisphere and posterior fossa appears to remain normal. Vascular: No suspicious intracranial vascular hyperdensity. Skull: Calvarium intact. Sinuses/Orbits: Chronic sinusitis with some progression in the ethmoids since 2017. Tympanic cavities and mastoids remain clear. Other: No acute orbit or scalp soft tissue finding. ASPECTS [facility] Stroke Program Early CT Score) Total score (0-10 with 10 being normal): Not applicable, acute hemorrhage. IMPRESSION: 1. Large mixed density  Left Side Subdural Hematoma, up to 19 mm in thickness. 2. Superimposed chronic left MCA territory encephalomalacia, with only mild rightward midline shift of 4 mm at this time. Critical Value/emergent results were called by telephone at the time of interpretation on 01/22/2020 at 9:58 pm to Dr. Madalyn Rob , who verbally acknowledged these results. Electronically Signed   By: Genevie Ann M.D.   On: 01/22/2020 22:00    Review of Systems   Reason unable to perform ROS: patient unable to cooperate fully.  Constitutional: Positive for activity change.  Cardiovascular: Positive for leg swelling.       Known DVT right leg   Blood pressure 109/79, pulse (!) 120, resp. rate 20, weight 112 kg, SpO2 100 %. Physical Exam  Constitutional: He appears lethargic.  Neurological: He appears lethargic. He displays tremor.  Right plegia(baseline, possibly exacerbated by subdural hematoma) Gait not assessed Coordination poor in upper extremities Will follow commands, will squeeze with left hand Arouses to voice, some perseveration, appropriate response to pain    Assessment/Plan: [name] is a 59 y.o. male With a an acute on chronic subdural hematoma on the left side. Minimal shift however. Basal cisterns widely patent, no ventricular effacement. No impending herniation. Will wait on blood transfusion tonight prior to any operative intervention. He is still following commands and is easily aroused.  Will monitor closely for neurological change. Protamine given, obviously lovenox is on hold. Should be admitted to ICU. Possible OR later today.   Ashok Pall 01/23/2020, 1:10 AM

## 2020-01-23 NOTE — ED Notes (Signed)
Pt still not answering questions, but will respond to painful stimuli such as an IV needle and yell obscenities. Language at that time is crystal clear and not slurred. However, while pt is verbal, he refuses to answer any questions.

## 2020-01-23 NOTE — ED Notes (Signed)
Unable to draw lactic and blood cultures due to blood transfusion in progress. MD aware. MD advised to start antibiotic therapy before drawing cultures if need be.

## 2020-01-23 NOTE — Progress Notes (Signed)
Responded to PIV consult. Both arms assessed with [place]. No appropriate vein for PIV needs. Midline not appropriate due to depth of veins. RN and MD aware. EJ to be attempted by ER.

## 2020-01-23 NOTE — Progress Notes (Signed)
Initial Nutrition Assessment  DOCUMENTATION CODES:   Not applicable  INTERVENTION:   Tube feeding:  -Osmolite 1.5 @ 60 ml/hr via Cortrak (1440 ml) -30 ml Prostat BID  Provides: 2360 kcals, 120 grams protein, 1097 ml free water.   NUTRITION DIAGNOSIS:   Inadequate oral intake related to inability to eat as evidenced by NPO status.  GOAL:   Patient will meet greater than or equal to 90% of their needs  MONITOR:   Diet advancement, Skin, TF tolerance, Weight trends, Labs, I & O's  REASON FOR ASSESSMENT:   Other (Comment)(Cortrak placement)    ASSESSMENT:   Patient with PMH significant for CKD IV, DM, essential HTN, GERD, CVA with residual hemiparesis (R side), HLD, and PVD. Presents this admission with acute on chronic SDH with AMS.    Pt confused/agitated. Unable to provide history. Cortrak order placed in ED for suspected swallowing complication in the future. Per MD, wait until tomorrow to start feeding. RD to leave recs.   Weight shows to be stable since January. Records lack history over the last year.   I/O: +341 ml since admit  UOP: 1,125 ml since admit    Drips: LR @ 75 ml/hr  Medications: SS novolog Labs: Cr 3.11- trending down CBG  136-156  NUTRITION - FOCUSED PHYSICAL EXAM:    Most Recent Value  Orbital Region  No depletion  Upper Arm Region  No depletion  Thoracic and Lumbar Region  Unable to assess  Buccal Region  No depletion  Temple Region  No depletion  Clavicle Bone Region  No depletion  Clavicle and Acromion Bone Region  No depletion  Scapular Bone Region  Unable to assess  Dorsal Hand  No depletion  Patellar Region  No depletion  Anterior Thigh Region  No depletion  Posterior Calf Region  No depletion  Edema (RD Assessment)  Unable to assess  Hair  Reviewed  Eyes  Reviewed  Mouth  Reviewed  Skin  Reviewed  Nails  Reviewed     Diet Order:   Diet Order            Diet NPO time specified  Diet effective now               EDUCATION NEEDS:   Not appropriate for education at this time  Skin:  Skin Assessment: Skin Integrity Issues: Skin Integrity Issues:: Other (Comment) Other: MASD- buttocks, groin  Last BM:  PTA  Height:   Ht Readings from Last 1 Encounters:  01/14/20 6\' 1"  (1.854 m)    Weight:   Wt Readings from Last 1 Encounters:  01/22/20 112 kg    Ideal Body Weight:  83.6 kg  BMI:  Body mass index is 32.58 kg/m.  Estimated Nutritional Needs:   Kcal:  2200-2400 kcal  Protein:  110-125 grams  Fluid:  >/= 2.2 L/day   Mariana Single RD, LDN Clinical Nutrition Pager listed in [place]

## 2020-01-23 NOTE — Progress Notes (Signed)
Pharmacy Antibiotic Note  Brendan [name] is a 59 y.o. male admitted on 01/22/2020 with sepsis.  Pharmacy has been consulted for cefepime dosing.  Plan: Cefepime 2gm IV q12 hours F/u renal function, cultures and clinical course  Weight: 246 lb 14.6 oz (112 kg)  Temp (24hrs), Avg:102.1 F (38.9 C), Min:102.1 F (38.9 C), Max:102.1 F (38.9 C)  Recent Labs  Lab 01/22/20 2213  WBC 9.7  CREATININE 3.38*    Estimated Creatinine Clearance: 30.5 mL/min (A) (by C-G formula based on SCr of 3.38 mg/dL (H)).    Allergies  Allergen Reactions  . Other     TB serum-reaction unknown. Provided via [facility] records     Thank you for allowing pharmacy to be a part of this patient's care.  Excell Seltzer Poteet 01/23/2020 2:55 AM

## 2020-01-23 NOTE — Progress Notes (Signed)
NAME:  Brendan [name], MRN:  [id number], DOB:  [birth year], LOS: 0 ADMISSION DATE:  01/22/2020, CONSULTATION DATE:   REFERRING MD:  , CHIEF COMPLAINT:  Aphasia and weakness   Brief History        This is a 59 year old nursing home resident who is chronically anticoagulated with Lovenox due to the presence of a right lower extremity DVT despite the fact that he has a IVC filter in place.  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  He came to medical attention at his [place] home because of aphasia and weakness.  A CT scan of the head has shown acute on chronic subdural hematoma.  There is also some encephalomalacia on the left underlying the hematoma consequently there is no midline shift.  Protamine was given in the emergency room however his anti ten a level remains elevated.  History of present illness   As above  Past Medical History  Otherwise remarkable for coronary disease diabetes history of DVT retroperitoneal fibrosis, prior CVA and IVC filter placement Significant Hospital Events    Consults:  Neurosurgery  Procedures:    Significant Diagnostic Tests:    Micro Data:  Urine and blood cultures are pending  Antimicrobials:  Cefepime  Interim history/subjective:    Objective   Blood pressure 123/76, pulse (!) 103, temperature 99.9 F (37.7 C), temperature source Axillary, resp. rate (!) 21, weight 112 kg, SpO2 100 %.        Intake/Output Summary (Last 24 hours) at 01/23/2020 1548 Last data filed at 01/23/2020 1338 Gross per 24 hour  Intake 1847.5 ml  Output 1300 ml  Net 547.5 ml   Filed Weights   01/22/20 2158  Weight: 112 kg    Examination: General: Chronically ill-appearing HENT: Dentition in very poor repair Lungs: Respirations are unlabored there is symmetric air movement no wheezes Cardiovascular: S1 and S2 are regular without murmur rub or gallop he has no overt JVD he has  bilateral lower extremity edema right much greater than left Abdomen: The abdomen is soft without any organomegaly masses tenderness guarding or rebound there is dark tea colored urine coming from his ureterostomy bag. Extremities: Edema as noted Neuro: He is easily awakened and attempt speech but speech is substantially garbled.  He cannot identify his name place of the circumstances leading to his admission.  He has limited voluntary movement on the right however the right arm appears to be somewhat contracted and is not clear to me how much of his decreased movement is acute.  He does move the left extremities without difficulty.  Pupils are equal and EOMs appear to be full GU:   Resolved Hospital Problem list     Assessment & Plan:  Subdural hematoma without mass-effect.  His Lovenox is not completely reversed I have spoken with neurosurgery and there is no plan to go emergently to the operating room.  Are obviously holding his anticoagulation. History of DVT with filter in place.  There is no urgency and replacing his anticoagulation History of retroperitoneal fibrosis with ureteric obstruction.  He was febrile on presentation and urine has been cultured. Altered mental status.  I have sent multiple metabolic studies to ensure that there is not some confounding metabolic factor contributing.  I am most suspicious of a UTI which is being covered empirically with cefepime.  LFTs thyroid functions and ionized calcium are also pending.  Best practice:  Diet: N.p.o. for  now Pain/Anxiety/Delirium protocol (if indicated): Not indicated VAP protocol (if indicated): Not currently indicated DVT prophylaxis: Known DVT with filter in place GI prophylaxis: Pepcid ordered Glucose control: Sliding scale insulin ordered Mobility: Bedrest Code Status: Full Family Communication:  Disposition:   Labs   CBC: Recent Labs  Lab 01/22/20 2213 01/23/20 0235 01/23/20 1400  WBC 9.7  --  10.4  NEUTROABS  7.9*  --   --   HGB 6.4*  --  8.0*  HCT 22.2*  --  25.7*  MCV 94.5  --  89.9  PLT 376 386 [id number]    Basic Metabolic Panel: Recent Labs  Lab 01/22/20 2213 01/23/20 1400  NA 134* 138  K 4.9 4.6  CL 99 106  CO2 22 20*  GLUCOSE 171* 156*  BUN 64* 55*  CREATININE 3.38* 3.11*  CALCIUM 8.7* 8.6*   GFR: Estimated Creatinine Clearance: 33.1 mL/min (A) (by C-G formula based on SCr of 3.11 mg/dL (H)). Recent Labs  Lab 01/22/20 2213 01/23/20 1400  WBC 9.7 10.4  LATICACIDVEN  --  1.0    Liver Function Tests: Recent Labs  Lab 01/22/20 2213  AST 57*  ALT 88*  ALKPHOS 70  BILITOT 0.7  PROT 7.6  ALBUMIN 3.4*   No results for input(s): LIPASE, AMYLASE in the last 168 hours. No results for input(s): AMMONIA in the last 168 hours.  ABG    Component Value Date/Time   TCO2 25 01/13/2020 1007     Coagulation Profile: Recent Labs  Lab 01/22/20 2213 01/23/20 0235  INR 1.2 1.2    Cardiac Enzymes: No results for input(s): CKTOTAL, CKMB, CKMBINDEX, TROPONINI in the last 168 hours.  HbA1C: No results found for: HGBA1C  CBG: Recent Labs  Lab 01/22/20 2157  GLUCAP 148*    Review of Systems:    Past Medical History  He,  has a past medical history of Acidosis, Acute unilateral obstructive uropathy, Allergic rhinitis, Chronic kidney disease, stage IV (severe) ([place]), Constipation, Coronary atherosclerosis of native coronary artery, Diabetes mellitus without complication ([place]), DVT (deep venous thrombosis) ([place]), Enlarged prostate with urinary obstruction, Essential hypertension, malignant, GERD (gastroesophageal reflux disease), Glaucoma, Hemiparesis affecting right side as late effect of cerebrovascular accident ([place]), Hemiplegia ([place]), Hydronephrosis, Hyperlipidemia, Hypertension, Hypokalemia, Muscle weakness (generalized), PVD (peripheral vascular disease) ([place]), Retroperitoneal fibrosis, Stricture or kinking of ureter, Stroke ([place]), and Vitamin D deficiency.   Surgical  History    Past Surgical History:  Procedure Laterality Date  . INTRAVASCULAR ULTRASOUND/IVUS N/A 01/13/2020   Procedure: Intravascular Ultrasound/IVUS;  Surgeon: Serafina Mitchell, MD;  Location: [place] [facility];  Service: Cardiovascular;  Laterality: N/A;  IJ, SVC, IVC  . IR GENERIC HISTORICAL  11/09/2016   IR NEPHROSTOMY EXCHANGE LEFT 11/09/2016 Corrie Mckusick, DO [facility]-INTERV RAD  . IR GENERIC HISTORICAL  01/09/2017   IR NEPHROSTOMY EXCHANGE LEFT 01/09/2017 [name], MD [facility]-INTERV RAD  . IR NEPHROSTOGRAM LEFT THRU EXISTING ACCESS  11/12/2019  . IR NEPHROSTOMY EXCHANGE LEFT  03/06/2017  . IR NEPHROSTOMY EXCHANGE LEFT  04/13/2017  . IR NEPHROSTOMY EXCHANGE LEFT  06/08/2017  . IR NEPHROSTOMY EXCHANGE LEFT  07/20/2017  . IR NEPHROSTOMY EXCHANGE LEFT  08/03/2017  . IR NEPHROSTOMY EXCHANGE LEFT  09/28/2017  . IR NEPHROSTOMY EXCHANGE LEFT  11/29/2017  . IR NEPHROSTOMY EXCHANGE LEFT  01/31/2018  . IR NEPHROSTOMY EXCHANGE LEFT  04/04/2018  . IR NEPHROSTOMY EXCHANGE LEFT  06/27/2018  . IR NEPHROSTOMY EXCHANGE LEFT  09/19/2018  . IR NEPHROSTOMY EXCHANGE LEFT  12/10/2018  .  IR NEPHROSTOMY EXCHANGE LEFT  06/26/2019  . IR NEPHROSTOMY EXCHANGE LEFT  10/16/2019  . IVC VENOGRAPHY N/A 01/13/2020   Procedure: IVC Venography;  Surgeon: Serafina Mitchell, MD;  Location: [place] [facility];  Service: Cardiovascular;  Laterality: N/A;  . LOWER EXTREMITY VENOGRAPHY Left 01/13/2020   Procedure: LOWER EXTREMITY VENOGRAPHY;  Surgeon: Serafina Mitchell, MD;  Location: [place] [facility];  Service: Cardiovascular;  Laterality: Left;  . NEPHROSTOMY TUBE PLACEMENT ([place] HX)       Social History   reports that he has never smoked. He has never used smokeless tobacco. He reports previous alcohol use. He reports previous drug use.   Family History   His Family history is unknown by patient.   Allergies Allergies  Allergen Reactions  . Other     TB serum-reaction unknown. Provided via [facility] records      Home Medications  Prior  to Admission medications   Medication Sig Start Date End Date Taking? Authorizing Provider  acetaminophen (TYLENOL) 325 MG tablet Take 650 mg by mouth every 8 (eight) hours as needed for moderate pain or headache.   Yes [provider]  aspirin EC 81 MG tablet Take 81 mg by mouth daily.   Yes [provider]  calcium carbonate (CALTRATE 600) 1500 (600 Ca) MG TABS tablet Take 1 tablet by mouth 2 (two) times daily.    Yes [provider]  cetaphil (CETAPHIL) lotion Apply 1 application topically 2 (two) times daily. Apply to arms, legs, and feet   Yes [provider]  Cholecalciferol 1000 units tablet Take 2,000 Units by mouth daily.    Yes [provider]  enoxaparin (LOVENOX) 120 MG/0.8ML injection Inject 0.73 mLs (110 mg total) into the skin every 12 (twelve) hours. 12/22/19 01/23/20 Yes Shah, Pratik D, DO  ezetimibe (ZETIA) 10 MG tablet Take 10 mg by mouth every evening.    Yes [provider]  furosemide (LASIX) 20 MG tablet Take 20 mg by mouth daily.   Yes [provider]  guaiFENesin (MUCINEX) 600 MG 12 hr tablet Take 600 mg by mouth 2 (two) times daily.   Yes [provider]  loratadine (CLARITIN) 10 MG tablet Take 10 mg by mouth daily.   Yes [provider]  mycophenolate (CELLCEPT) 500 MG tablet Take 500 mg by mouth 2 (two) times daily.   Yes [provider]  oxyCODONE-acetaminophen (PERCOCET/ROXICET) 5-325 MG tablet Take 1 tablet by mouth every 6 (six) hours as needed for severe pain.    Yes [provider]  senna (SENOKOT) 8.6 MG TABS tablet Take 8.6 mg by mouth daily.    Yes [provider]  simvastatin (ZOCOR) 40 MG tablet Take 40 mg by mouth at bedtime.   Yes [provider]  sodium bicarbonate 650 MG tablet Take 650 mg by mouth 3 (three) times daily.   Yes [provider]  tamsulosin (FLOMAX) 0.4 MG CAPS capsule Take 0.4 mg by mouth at bedtime.   Yes [provider]         Lars Masson, MD Critical Care Medicine

## 2020-01-23 NOTE — Progress Notes (Signed)
[place] Progress Note [name] DOB: [birth year] MRN: [id number]   Date of Service  01/23/2020  HPI/Events of Note  Pt admitted to 4 N over night with a subdural hematoma and altered mental status, he is pending evacuation of the SDH by neurosurgery. Alteration of mental status persists but is stable, he has a pre-existing right hemiplegia from a basal ganglia CVA. There is no urgency from a bed availability point of view to transfer him out of the ICU immediately.  eICU Interventions  Keep in 4N ICU for now, re-evaluate if bed situation mandates it.        Kerry Kass Khriz Liddy 01/23/2020, 9:12 PM

## 2020-01-23 NOTE — ED Notes (Signed)
Pt.is getting blood at this time 

## 2020-01-23 NOTE — Progress Notes (Signed)
CRITICAL VALUE STICKER  CRITICAL VALUE: Lactic acid 2.6  MD NOTIFIED: Pearline Cables  RESPONSE:  524ml NS bolus

## 2020-01-24 ENCOUNTER — Inpatient Hospital Stay (HOSPITAL_COMMUNITY): Payer: Medicaid Other

## 2020-01-24 DIAGNOSIS — R609 Edema, unspecified: Secondary | ICD-10-CM

## 2020-01-24 LAB — COMPREHENSIVE METABOLIC PANEL
ALT: 76 U/L — ABNORMAL HIGH (ref 0–44)
AST: 48 U/L — ABNORMAL HIGH (ref 15–41)
Albumin: 2.9 g/dL — ABNORMAL LOW (ref 3.5–5.0)
Alkaline Phosphatase: 55 U/L (ref 38–126)
Anion gap: 14 (ref 5–15)
BUN: 47 mg/dL — ABNORMAL HIGH (ref 6–20)
CO2: 19 mmol/L — ABNORMAL LOW (ref 22–32)
Calcium: 8.6 mg/dL — ABNORMAL LOW (ref 8.9–10.3)
Chloride: 108 mmol/L (ref 98–111)
Creatinine, Ser: 2.95 mg/dL — ABNORMAL HIGH (ref 0.61–1.24)
GFR calc Af Amer: 26 mL/min — ABNORMAL LOW (ref >60)
GFR calc non Af Amer: 22 mL/min — ABNORMAL LOW (ref >60)
Glucose, Bld: 138 mg/dL — ABNORMAL HIGH (ref 70–99)
Potassium: 4.6 mmol/L (ref 3.5–5.1)
Sodium: 141 mmol/L (ref 135–145)
Total Bilirubin: 1.3 mg/dL — ABNORMAL HIGH (ref 0.3–1.2)
Total Protein: 7.2 g/dL (ref 6.5–8.1)

## 2020-01-24 LAB — LACTIC ACID, PLASMA
Lactic Acid, Venous: 3.1 mmol/L (ref 0.5–1.9)
Lactic Acid, Venous: 1.9 mmol/L (ref 0.5–1.9)

## 2020-01-24 LAB — CBC
HCT: 27.1 % — ABNORMAL LOW (ref 39.0–52.0)
Hemoglobin: 8.1 g/dL — ABNORMAL LOW (ref 13.0–17.0)
MCH: 27.6 pg (ref 26.0–34.0)
MCHC: 29.9 g/dL — ABNORMAL LOW (ref 30.0–36.0)
MCV: 92.2 fL (ref 80.0–100.0)
Platelets: 309 10*3/uL (ref 150–400)
RBC: 2.94 MIL/uL — ABNORMAL LOW (ref 4.22–5.81)
RDW: 18.6 % — ABNORMAL HIGH (ref 11.5–15.5)
WBC: 10.8 10*3/uL — ABNORMAL HIGH (ref 4.0–10.5)
nRBC: 0.8 % — ABNORMAL HIGH (ref 0.0–0.2)

## 2020-01-24 LAB — GLUCOSE, CAPILLARY
Glucose-Capillary: 154 mg/dL — ABNORMAL HIGH (ref 70–99)
Glucose-Capillary: 143 mg/dL — ABNORMAL HIGH (ref 70–99)
Glucose-Capillary: 140 mg/dL — ABNORMAL HIGH (ref 70–99)
Glucose-Capillary: 157 mg/dL — ABNORMAL HIGH (ref 70–99)
Glucose-Capillary: 127 mg/dL — ABNORMAL HIGH (ref 70–99)
Glucose-Capillary: 132 mg/dL — ABNORMAL HIGH (ref 70–99)

## 2020-01-24 LAB — T4, FREE: Free T4: 1.19 ng/dL — ABNORMAL HIGH (ref 0.61–1.12)

## 2020-01-24 LAB — HEMOGLOBIN A1C
Hgb A1c MFr Bld: 5 % (ref 4.8–5.6)
Mean Plasma Glucose: 96.8 mg/dL

## 2020-01-24 LAB — TSH: TSH: 1.119 u[IU]/mL (ref 0.350–4.500)

## 2020-01-24 MED ORDER — OXYCODONE HCL 5 MG PO TABS
5.0000 mg | ORAL_TABLET | Freq: Three times a day (TID) | ORAL | Status: DC | PRN
  Administered 2020-01-24: 22:00:00 5 mg via ORAL
  Filled 2020-01-24: qty 1

## 2020-01-24 NOTE — Progress Notes (Signed)
No new issues or problems overnight.  Patient wide awake and aware.  He is aphasic but that is unclear whether this is his baseline or not.  He has stable right-sided hemiparesis.  He follows commands with his left side.  Patient with a significant acute on chronic left convexity subdural hematoma.  Patient also with prior left-sided ischemic stroke with significant deficit.  Patient also with multiple medical issues.  Currently he appears stable from a neurologic standpoint may be even improved from when he came in.  We will continue observation for now.  Plan to check follow-up head CT scan in morning.

## 2020-01-24 NOTE — Evaluation (Signed)
Speech Language Pathology Evaluation Patient Details Name: Brendan [name] MRN: [id number] DOB: 1960-12-21 Today's Date: 01/24/2020 Time: 1210-1230 SLP Time Calculation (min) (ACUTE ONLY): 20 min  Problem List:  Patient Active Problem List   Diagnosis Date Noted  . Subdural hematoma without coma ([place]) 01/23/2020  . DVT (deep venous thrombosis) ([place]) 12/19/2019  . Right leg DVT ([place]) 12/18/2019  . Symptomatic anemia 03/22/2018  . History of DVT in adulthood 03/22/2018  . Gross hematuria   . Nephrostomy tube displaced ([place]) 07/19/2017  . Pyelonephritis 02/16/2017  . 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 Coronary atherosclerosis of native coronary artery   . Essential hypertension   . Stroke ([place])   . Diabetes mellitus without complication ([place])   . Chronic kidney disease, stage IV (severe) ([place])   . GERD (gastroesophageal reflux disease)   . Glaucoma   . Hemiparesis affecting right side as late effect of cerebrovascular accident ([place])   . Enlarged prostate with urinary obstruction   . Constipation    Past Medical History:  Past Medical History:  Diagnosis Date  . Acidosis   . Acute unilateral obstructive uropathy   . Allergic rhinitis   . Chronic kidney disease, stage IV (severe) ([place])   . Constipation   . Coronary atherosclerosis of native coronary artery   . Diabetes mellitus without complication ([place])   . DVT (deep venous thrombosis) ([place])   . Enlarged prostate with urinary obstruction   . Essential hypertension, malignant   . GERD (gastroesophageal reflux disease)   . Glaucoma   . Hemiparesis affecting right side as late effect of cerebrovascular accident ([place])   . Hemiplegia ([place])   . Hydronephrosis   . Hyperlipidemia   . Hypertension   . Hypokalemia   . Muscle weakness (generalized)   . PVD (peripheral vascular disease) ([place])   . Retroperitoneal fibrosis   . Stricture or kinking of ureter   . Stroke [facility])    right sided hemiparesis  . Vitamin D deficiency     Past Surgical History:  Past Surgical History:  Procedure Laterality Date  . INTRAVASCULAR ULTRASOUND/IVUS N/A 01/13/2020   Procedure: Intravascular Ultrasound/IVUS;  Surgeon: Serafina Mitchell, MD;  Location: [place] [facility];  Service: Cardiovascular;  Laterality: N/A;  IJ, SVC, IVC  . IR GENERIC HISTORICAL  11/09/2016   IR NEPHROSTOMY EXCHANGE LEFT 11/09/2016 Corrie Mckusick, DO [facility]-INTERV RAD  . IR GENERIC HISTORICAL  01/09/2017   IR NEPHROSTOMY EXCHANGE LEFT 01/09/2017 [name], MD [facility]-INTERV RAD  . IR NEPHROSTOGRAM LEFT THRU EXISTING ACCESS  11/12/2019  . IR NEPHROSTOMY EXCHANGE LEFT  03/06/2017  . IR NEPHROSTOMY EXCHANGE LEFT  04/13/2017  . IR NEPHROSTOMY EXCHANGE LEFT  06/08/2017  . IR NEPHROSTOMY EXCHANGE LEFT  07/20/2017  . IR NEPHROSTOMY EXCHANGE LEFT  08/03/2017  . IR NEPHROSTOMY EXCHANGE LEFT  09/28/2017  . IR NEPHROSTOMY EXCHANGE LEFT  11/29/2017  . IR NEPHROSTOMY EXCHANGE LEFT  01/31/2018  . IR NEPHROSTOMY EXCHANGE LEFT  04/04/2018  . IR NEPHROSTOMY EXCHANGE LEFT  06/27/2018  . IR NEPHROSTOMY EXCHANGE LEFT  09/19/2018  . IR NEPHROSTOMY EXCHANGE LEFT  12/10/2018  . IR NEPHROSTOMY EXCHANGE LEFT  06/26/2019  . IR NEPHROSTOMY EXCHANGE LEFT  10/16/2019  . IVC VENOGRAPHY N/A 01/13/2020   Procedure: IVC Venography;  Surgeon: Serafina Mitchell, MD;  Location: [place] [facility];  Service: Cardiovascular;  Laterality: N/A;  . LOWER EXTREMITY VENOGRAPHY Left 01/13/2020   Procedure: LOWER EXTREMITY VENOGRAPHY;  Surgeon: Serafina Mitchell, MD;  Location: [place] INVASIVE CV  LAB;  Service: Cardiovascular;  Laterality: Left;  . NEPHROSTOMY TUBE PLACEMENT ([place] HX)     HPI:  This is a 59 year old nursing home resident with DM, L CVA 2009, and CAD who is chronically anticoagulated with Lovenox due to the presence of a right lower extremity DVT despite the fact that he has a IVC filter in place.  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  He  came to medical attention at his [place] home because of aphasia and weakness.  A CT scan of the head has shown acute on chronic subdural hematoma.   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 He exhibited impairments in initiation, ability to respond to yes/no questions, orientation (only oriented to name/self), slow processing, poor attention and awareness. He would respond to basic level questions with a "mmm hmm" or would just state his name "Brendan [name]". He did not initiate eye contact, spoke at one-word level in monotone voice and did not initiate any verbal or non-verbal communication. Per sister who SLP spoke with on the phone (lives in [place]), when she saw him about 1-2 years ago, he was speaking in sentences, would be slow to respond, but could feed himself, help with WC transfers and would call her and his Mom frequently on phone.    SLP Assessment  SLP Recommendation/Assessment: Patient needs continued Speech Lanaguage Pathology Services SLP Visit Diagnosis: Cognitive communication deficit (R41.841);Apraxia (R48.2)    Follow Up Recommendations  24 hour supervision/assistance;Skilled Nursing facility    Frequency and Duration min 2x/week  2 weeks      SLP Evaluation Cognition  Overall Cognitive Status: Impaired/Different from baseline Orientation Level: Oriented to person Attention: Focused Focused Attention: Impaired Focused Attention Impairment: Verbal basic;Functional basic Awareness: Impaired Awareness Impairment: Intellectual impairment Executive Function: Initiating Initiating: Impaired Initiating Impairment: Verbal basic;Functional basic       Comprehension  Auditory Comprehension Overall Auditory Comprehension: Impaired Yes/No Questions: Impaired Basic Biographical Questions: 0-25% accurate Commands: Impaired One Step Basic Commands:  50-74% accurate Conversation: Simple Interfering Components: Attention;Motor planning;Processing speed EffectiveTechniques: Extra processing time;Increased volume    Expression Expression Primary Mode of Expression: Verbal Verbal Expression Overall Verbal Expression: Impaired Initiation: Impaired Automatic Speech: Name Level of Generative/Spontaneous Verbalization: Word Pragmatics: Impairment Impairments: Abnormal affect;Eye contact;Monotone Interfering Components: Attention Non-Verbal Means of Communication: Not applicable   Oral / Motor  Oral Motor/Sensory Function Overall Oral Motor/Sensory Function: Mild impairment Lingual ROM: Reduced left;Reduced right Lingual Symmetry: Abnormal symmetry left Lingual Strength: Reduced Motor Speech Overall Motor Speech: Impaired Respiration: Within functional limits Resonance: Within functional limits Articulation: Within functional limitis Intelligibility: Intelligible Motor Planning: Impaired(appeared with suspected motor apraxia) Level of Impairment: Word Motor Speech Errors: Not applicable   GO                    Dannial Monarch 01/24/2020, 6:06 PM  Sonia Baller, MA, CCC-SLP Speech Therapy [facility] Acute Rehab

## 2020-01-24 NOTE — Evaluation (Signed)
Clinical/Bedside Swallow Evaluation Patient Details  Name: Brendan [name] MRN: [id number] Date of Birth: 12/29/1960  Today's Date: 01/24/2020 Time: SLP Start Time (ACUTE ONLY): 1600 SLP Stop Time (ACUTE ONLY): 1620 SLP Time Calculation (min) (ACUTE ONLY): 20 min  Past Medical History:  Past Medical History:  Diagnosis Date  . Acidosis   . Acute unilateral obstructive uropathy   . Allergic rhinitis   . Chronic kidney disease, stage IV (severe) ([place])   . Constipation   . Coronary atherosclerosis of native coronary artery   . Diabetes mellitus without complication ([place])   . DVT (deep venous thrombosis) (Brendan [name])   . Enlarged prostate with urinary obstruction   . Essential hypertension, malignant   . GERD (gastroesophageal reflux disease)   . Glaucoma   . Hemiparesis affecting right side as late effect of cerebrovascular accident ([place])   . Hemiplegia ([place])   . Hydronephrosis   . Hyperlipidemia   . Hypertension   . Hypokalemia   . Muscle weakness (generalized)   . PVD (peripheral vascular disease) ([place])   . Retroperitoneal fibrosis   . Stricture or kinking of ureter   . Stroke [facility])    right sided hemiparesis  . Vitamin D deficiency    Past Surgical History:  Past Surgical History:  Procedure Laterality Date  . INTRAVASCULAR ULTRASOUND/IVUS N/A 01/13/2020   Procedure: Intravascular Ultrasound/IVUS;  Surgeon: Serafina Mitchell, MD;  Location: [place] [facility];  Service: Cardiovascular;  Laterality: N/A;  IJ, SVC, IVC  . IR GENERIC HISTORICAL  11/09/2016   IR NEPHROSTOMY EXCHANGE LEFT 11/09/2016 Corrie Mckusick, DO [facility]-INTERV RAD  . IR GENERIC HISTORICAL  01/09/2017   IR NEPHROSTOMY EXCHANGE LEFT 01/09/2017 [name], MD [facility]-INTERV RAD  . IR NEPHROSTOGRAM LEFT THRU EXISTING ACCESS  11/12/2019  . IR NEPHROSTOMY EXCHANGE LEFT  03/06/2017  . IR NEPHROSTOMY EXCHANGE LEFT  04/13/2017  . IR NEPHROSTOMY EXCHANGE LEFT  06/08/2017  . IR NEPHROSTOMY EXCHANGE LEFT  07/20/2017  . IR NEPHROSTOMY EXCHANGE  LEFT  08/03/2017  . IR NEPHROSTOMY EXCHANGE LEFT  09/28/2017  . IR NEPHROSTOMY EXCHANGE LEFT  11/29/2017  . IR NEPHROSTOMY EXCHANGE LEFT  01/31/2018  . IR NEPHROSTOMY EXCHANGE LEFT  04/04/2018  . IR NEPHROSTOMY EXCHANGE LEFT  06/27/2018  . IR NEPHROSTOMY EXCHANGE LEFT  09/19/2018  . IR NEPHROSTOMY EXCHANGE LEFT  12/10/2018  . IR NEPHROSTOMY EXCHANGE LEFT  06/26/2019  . IR NEPHROSTOMY EXCHANGE LEFT  10/16/2019  . IVC VENOGRAPHY N/A 01/13/2020   Procedure: IVC Venography;  Surgeon: Serafina Mitchell, MD;  Location: [place] [facility];  Service: Cardiovascular;  Laterality: N/A;  . LOWER EXTREMITY VENOGRAPHY Left 01/13/2020   Procedure: LOWER EXTREMITY VENOGRAPHY;  Surgeon: Serafina Mitchell, MD;  Location: [place] [facility];  Service: Cardiovascular;  Laterality: Left;  . NEPHROSTOMY TUBE PLACEMENT ([place] HX)     HPI:  This is a 59 year old nursing home resident with DM, L CVA 2009, and CAD who is chronically anticoagulated with Lovenox due to the presence of a right lower extremity DVT despite the fact that he has a IVC filter in place.  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  He came to medical attention at his [place] home because of aphasia and weakness.  A CT scan of the head has shown acute on chronic subdural hematoma.   Assessment / Plan / Recommendation Clinical Impression  Patient presents with a mild oropharygneal dysphagia without overt s/s  of aspiration or penetration, but with oral delays and delays in swallow initiation. Patient has been exhibiting fluctating alertness and at this time, is not safe to be on full PO's. He would benefit from continuing with oral care and adding ice chips or sips of water PRN. SLP will follow for patient readiness for starting full PO's. SLP Visit Diagnosis: Dysphagia, unspecified (R13.10)    Aspiration Risk  Mild aspiration risk    Diet Recommendation Ice chips PRN after oral  care;NPO   Medication Administration: Via alternative means Postural Changes: Seated upright at 90 degrees    Other  Recommendations Oral Care Recommendations: Oral care BID;Staff/trained caregiver to provide oral care   Follow up Recommendations 24 hour supervision/assistance;Skilled Nursing facility      Frequency and Duration min 2x/week  2 weeks       Prognosis Prognosis for Safe Diet Advancement: Good      Swallow Study   General Date of Onset: 01/23/20 HPI: This is a 59 year old nursing home resident with DM, L CVA 2009, and CAD who is chronically anticoagulated with Lovenox due to the presence of a right lower extremity DVT despite the fact that he has a IVC filter in place.  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  He came to medical attention at his [place] home because of aphasia and weakness.  A CT scan of the head has shown acute on chronic subdural hematoma. Type of Study: Bedside Swallow Evaluation Previous Swallow Assessment: N/A Diet Prior to this Study: NPO Temperature Spikes Noted: No Respiratory Status: Room air History of Recent Intubation: No Behavior/Cognition: Alert;Cooperative;Requires cueing;Distractible;Lethargic/Drowsy Oral Cavity Assessment: Within Functional Limits Oral Care Completed by SLP: Yes Oral Cavity - Dentition: Poor condition Self-Feeding Abilities: Total assist Patient Positioning: Upright in bed Baseline Vocal Quality: Normal;Low vocal intensity Volitional Cough: Cognitively unable to elicit Volitional Swallow: Unable to elicit    Oral/Motor/Sensory Function Overall Oral Motor/Sensory Function: Mild impairment Lingual ROM: Reduced left;Reduced right Lingual Symmetry: Abnormal symmetry left Lingual Strength: Reduced   Ice Chips     Thin Liquid Thin Liquid: Impaired Presentation: Straw Pharyngeal  Phase Impairments: Suspected delayed Swallow Other  Comments: No overt s/s of aspiration or penetration but with delayed swallow initiation    Nectar Thick     Honey Thick     Puree Puree: Impaired Pharyngeal Phase Impairments: Suspected delayed Swallow   Solid            Dannial Monarch 01/24/2020,6:11 PM   Sonia Baller, MA, CCC-SLP Speech Therapy [facility]

## 2020-01-24 NOTE — Progress Notes (Signed)
[place] Progress Note [name] DOB: 1961-07-07 MRN: [id number]   Date of Service  01/24/2020  HPI/Events of Note  Headache. Patient is requesting his home medication (Percocet). Already ordered for APAP separately.   eICU Interventions  Ordered oxycodone 5mg  Q8H PRN severe pain.     Intervention Category Intermediate Interventions: Pain - evaluation and management  [name] Agnes Brightbill 01/24/2020, 10:04 PM

## 2020-01-24 NOTE — Progress Notes (Signed)
NAME:  Brendan [name], MRN:  [id number], DOB:  07-30-1961, LOS: 1 ADMISSION DATE:  01/22/2020, CONSULTATION DATE:   REFERRING MD:  , CHIEF COMPLAINT:  Aphasia and weakness   Brief History        This is a 59 year old nursing home resident who is chronically anticoagulated with Lovenox due to the presence of a right lower extremity DVT despite the fact that he has a IVC filter in place.  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  He came to medical attention at his nursing home because of aphasia and weakness.  A CT scan of the head has shown acute on chronic subdural hematoma.  There is also some encephalomalacia on the left underlying the hematoma consequently there is no midline shift.    History of present illness   As above  Past Medical History  Otherwise remarkable for coronary disease diabetes history of DVT retroperitoneal fibrosis, prior CVA and IVC filter placement Significant Hospital Events    Consults:  Neurosurgery  Procedures:    Significant Diagnostic Tests:  I do not see an IVC filter on today's KUB film  Micro Data:  Urine and blood cultures are pending  Antimicrobials:  Cefepime  Interim history/subjective:  He has had an uneventful night.  He remains nonverbal.  Objective   Blood pressure 121/76, pulse 88, temperature 98.6 F (37 C), temperature source Oral, resp. rate 11, height 6\' 1"  (1.854 m), weight 110.3 kg, SpO2 98 %.        Intake/Output Summary (Last 24 hours) at 01/24/2020 0820 Last data filed at 01/24/2020 0800 Gross per 24 hour  Intake 4248.87 ml  Output 2275 ml  Net 1973.87 ml   Filed Weights   01/22/20 2158 01/23/20 1500  Weight: 112 kg 110.3 kg    Examination: General: Chronically ill-appearing and in no distress.   HENT: Dentition in poor repair.   Lungs: Respirations are unlabored, there is symmetric air movement and no wheezes.  Cardiovascular: S1 and S2 are  regular without murmur rub or gallop.  There is no JVD.  He has bilateral lower extremity edema much greater on the right. Abdomen: The abdomen is soft out any organomegaly masses tenderness guarding or rebound.  . Extremities: Edema as noted Neuro: He is easily awakened and orients towards the speaker but does not attempt speech.  He does intermittently follow some simple instructions.  He does partially withdraw the right upper extremity to noxious stimuli not the right lower extremity.  He does seem to have some contractions in the right upper extremity.  He moves the left extremities vigorously to noxious stimuli.  Pupils are equal and EOMs appear to be full GU: The nephrostomy drainage is still blood-tinged.  Resolved Hospital Problem list     Assessment & Plan:  Subdural hematoma without mass-effect.  It is not clear to me how far away from baseline mental status we are.  He likely did have a UTI which may in part have accounted for altered mental status.  I will be discussing with neurosurgery whether there is any surgical indication. History of DVT.  I do not see a IVC filter on a KUB film this morning.  I will be obtaining Dopplers of the lower extremities and if he still has significant lower extremity clot burden I will be contemplating placement of a filter.  Even if not for the subdural hematoma he continues to have blood in  his urine and is not a candidate for anticoagulation in the near future History of retroperitoneal fibrosis with ureteric obstruction.  He was febrile on presentation and urine has been cultured.  He continues empiric cefepime Altered mental status.  I have sent multiple metabolic studies to ensure that there is not some confounding metabolic factor contributing.  LFTs are slightly abnormal but probably do not account for altered mental status, TSH is normal.  I continue to empirically treat for infection  Best practice:  Diet: N.p.o. for now Pain/Anxiety/Delirium  protocol (if indicated): Not indicated VAP protocol (if indicated): Not currently indicated DVT prophylaxis: Known DVT with filter in place GI prophylaxis: Pepcid ordered Glucose control: Sliding scale insulin ordered Mobility: Bedrest Code Status: Full Family Communication:  Disposition:   Labs   CBC: Recent Labs  Lab 01/22/20 2213 01/23/20 0235 01/23/20 1400 01/23/20 1900 01/24/20 0517  WBC 9.7  --  10.4 11.5* 10.8*  NEUTROABS 7.9*  --   --  9.7*  --   HGB 6.4*  --  8.0* 7.9* 8.1*  HCT 22.2*  --  25.7* 26.5* 27.1*  MCV 94.5  --  89.9 91.4 92.2  PLT 376 386 310 319 [id number]    Basic Metabolic Panel: Recent Labs  Lab 01/22/20 2213 01/23/20 1400 01/24/20 0517  NA 134* 138 141  K 4.9 4.6 4.6  CL 99 106 108  CO2 22 20* 19*  GLUCOSE 171* 156* 138*  BUN 64* 55* 47*  CREATININE 3.38* 3.11* 2.95*  CALCIUM 8.7* 8.6* 8.6*   GFR: Estimated Creatinine Clearance: 34.7 mL/min (A) (by C-G formula based on SCr of 2.95 mg/dL (H)). Recent Labs  Lab 01/22/20 2213 01/23/20 1400 01/23/20 1645 01/23/20 1900 01/24/20 0517  WBC 9.7 10.4  --  11.5* 10.8*  LATICACIDVEN  --  1.0 2.6*  --   --     Liver Function Tests: Recent Labs  Lab 01/22/20 2213 01/24/20 0517  AST 57* 48*  ALT 88* 76*  ALKPHOS 70 55  BILITOT 0.7 1.3*  PROT 7.6 7.2  ALBUMIN 3.4* 2.9*   No results for input(s): LIPASE, AMYLASE in the last 168 hours. No results for input(s): AMMONIA in the last 168 hours.  ABG    Component Value Date/Time   TCO2 25 01/13/2020 1007     Coagulation Profile: Recent Labs  Lab 01/22/20 2213 01/23/20 0235  INR 1.2 1.2    Cardiac Enzymes: No results for input(s): CKTOTAL, CKMB, CKMBINDEX, TROPONINI in the last 168 hours.  HbA1C: Hgb A1c MFr Bld  Date/Time Value Ref Range Status  01/24/2020 05:17 AM 5.0 4.8 - 5.6 % Final    Comment:    (NOTE) Pre diabetes:          5.7%-6.4% Diabetes:              >6.4% Glycemic control for   <7.0% adults with diabetes     01/23/2020 04:32 PM 5.2 4.8 - 5.6 % Final    Comment:    (NOTE) Pre diabetes:          5.7%-6.4% Diabetes:              >6.4% Glycemic control for   <7.0% adults with diabetes     CBG: Recent Labs  Lab 01/23/20 1700 01/23/20 1907 01/23/20 2251 01/24/20 0301 01/24/20 0726  GLUCAP 136* 171* 139* 154* 143*    Review of Systems:    Past Medical History  He,  has a past medical history of Acidosis,  Acute unilateral obstructive uropathy, Allergic rhinitis, Chronic kidney disease, stage IV (severe) ([place]), Constipation, Coronary atherosclerosis of native coronary artery, Diabetes mellitus without complication ([place]), DVT (deep venous thrombosis) ([place]), Enlarged prostate with urinary obstruction, Essential hypertension, malignant, GERD (gastroesophageal reflux disease), Glaucoma, Hemiparesis affecting right side as late effect of cerebrovascular accident ([place]), Hemiplegia ([place]), Hydronephrosis, Hyperlipidemia, Hypertension, Hypokalemia, Muscle weakness (generalized), PVD (peripheral vascular disease) ([place]), Retroperitoneal fibrosis, Stricture or kinking of ureter, Stroke ([place]), and Vitamin D deficiency.   Surgical History    Past Surgical History:  Procedure Laterality Date  . INTRAVASCULAR ULTRASOUND/IVUS N/A 01/13/2020   Procedure: Intravascular Ultrasound/IVUS;  Surgeon: Serafina Mitchell, MD;  Location: [place] [facility];  Service: Cardiovascular;  Laterality: N/A;  IJ, SVC, IVC  . IR GENERIC HISTORICAL  11/09/2016   IR NEPHROSTOMY EXCHANGE LEFT 11/09/2016 Corrie Mckusick, DO [facility]INTER[facility]  . IR GENERIC HISTORICAL  01/09/2017   IR NEPHROSTOMY EXCHANGE LEFT 01/09/2017 [name], MD [facility]INTER[facility]  . IR NEPHROSTOGRAM LEFT THRU EXISTING ACCESS  11/12/2019  . IR NEPHROSTOMY EXCHANGE LEFT  03/06/2017  . IR NEPHROSTOMY EXCHANGE LEFT  04/13/2017  . IR NEPHROSTOMY EXCHANGE LEFT  06/08/2017  . IR NEPHROSTOMY EXCHANGE LEFT  07/20/2017  . IR NEPHROSTOMY EXCHANGE LEFT  08/03/2017  . IR NEPHROSTOMY  EXCHANGE LEFT  09/28/2017  . IR NEPHROSTOMY EXCHANGE LEFT  11/29/2017  . IR NEPHROSTOMY EXCHANGE LEFT  01/31/2018  . IR NEPHROSTOMY EXCHANGE LEFT  04/04/2018  . IR NEPHROSTOMY EXCHANGE LEFT  06/27/2018  . IR NEPHROSTOMY EXCHANGE LEFT  09/19/2018  . IR NEPHROSTOMY EXCHANGE LEFT  12/10/2018  . IR NEPHROSTOMY EXCHANGE LEFT  06/26/2019  . IR NEPHROSTOMY EXCHANGE LEFT  10/16/2019  . IVC VENOGRAPHY N/A 01/13/2020   Procedure: IVC Venography;  Surgeon: Serafina Mitchell, MD;  Location: [place] [facility];  Service: Cardiovascular;  Laterality: N/A;  . LOWER EXTREMITY VENOGRAPHY Left 01/13/2020   Procedure: LOWER EXTREMITY VENOGRAPHY;  Surgeon: Serafina Mitchell, MD;  Location: [place] [facility];  Service: Cardiovascular;  Laterality: Left;  . NEPHROSTOMY TUBE PLACEMENT ([place] HX)       Social History   reports that he has never smoked. He has never used smokeless tobacco. He reports previous alcohol use. He reports previous drug use.   Family History   His Family history is unknown by patient.   Allergies Allergies  Allergen Reactions  . Other     TB serum-reaction unknown. Provided via [facility] records      Home Medications  Prior to Admission medications   Medication Sig Start Date End Date Taking? Authorizing Provider  acetaminophen (TYLENOL) 325 MG tablet Take 650 mg by mouth every 8 (eight) hours as needed for moderate pain or headache.   Yes [provider]  aspirin EC 81 MG tablet Take 81 mg by mouth daily.   Yes [provider]  calcium carbonate (CALTRATE 600) 1500 (600 Ca) MG TABS tablet Take 1 tablet by mouth 2 (two) times daily.    Yes [provider]  cetaphil (CETAPHIL) lotion Apply 1 application topically 2 (two) times daily. Apply to arms, legs, and feet   Yes [provider]  Cholecalciferol 1000 units tablet Take 2,000 Units by mouth daily.    Yes [provider]  enoxaparin (LOVENOX) 120 MG/0.8ML injection Inject 0.73 mLs (110 mg total)  into the skin every 12 (twelve) hours. 12/22/19 01/23/20 Yes Shah, Pratik D, DO  ezetimibe (ZETIA) 10 MG tablet Take 10 mg by mouth every evening.  Yes [provider]  furosemide (LASIX) 20 MG tablet Take 20 mg by mouth daily.   Yes [provider]  guaiFENesin (MUCINEX) 600 MG 12 hr tablet Take 600 mg by mouth 2 (two) times daily.   Yes [provider]  loratadine (CLARITIN) 10 MG tablet Take 10 mg by mouth daily.   Yes [provider]  mycophenolate (CELLCEPT) 500 MG tablet Take 500 mg by mouth 2 (two) times daily.   Yes [provider]  oxyCODONE-acetaminophen (PERCOCET/ROXICET) 5-325 MG tablet Take 1 tablet by mouth every 6 (six) hours as needed for severe pain.    Yes [provider]  senna (SENOKOT) 8.6 MG TABS tablet Take 8.6 mg by mouth daily.    Yes [provider]  simvastatin (ZOCOR) 40 MG tablet Take 40 mg by mouth at bedtime.   Yes [provider]  sodium bicarbonate 650 MG tablet Take 650 mg by mouth 3 (three) times daily.   Yes [provider]  tamsulosin (FLOMAX) 0.4 MG CAPS capsule Take 0.4 mg by mouth at bedtime.   Yes [provider]         Lars Masson, MD Critical Care Medicine

## 2020-01-24 NOTE — Progress Notes (Signed)
VASCULAR LAB PRELIMINARY  PRELIMINARY  PRELIMINARY  PRELIMINARY  Bilateral lower extremity venous duplex completed.    Preliminary report:  See CV proc for preliminary results.  Makhayla Mcmurry, RVT 01/24/2020, 1:15 PM

## 2020-01-24 NOTE — Evaluation (Signed)
Physical Therapy Evaluation Patient Details Name: Brendan [name] MRN: [id number] DOB: March 03, 1961 Today's Date: 01/24/2020   History of Present Illness  This is a 59 year old nursing home resident with DM, L CVA 2009, and CAD who is chronically anticoagulated with Lovenox due to the presence of a right lower extremity DVT despite the fact that he has a IVC filter in place.  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  He came to medical attention at his [place] home because of aphasia and weakness.  A CT scan of the head has shown acute on chronic subdural hematoma.  Clinical Impression  Pt admitted with above diagnosis. Pt presents with expressive and expressive aphasia, answers "Tacuma Lauerman" to most questions. Per pt's sister, he was calling his mom on phone and talking 5x/ day until about 2 wks ago. So seems to be quite a bit altered from that cognitive state. Lethargic on eval with R gaze preference. Able to come to sitting with use of L side and mod A. Lifts L arm up in air with an cue involving the LUE, also lifts L arm when first cued to kick LLE. Able to move LLE on cue after task demonstrated passively. Mod A +2 for return to supine.  Pt currently with functional limitations due to the deficits listed below (see PT Problem List). Pt will benefit from skilled PT to increase their independence and safety with mobility to allow discharge to the venue listed below.       Follow Up Recommendations SNF;Supervision/Assistance - 24 hour    Equipment Recommendations  None recommended by PT    Recommendations for Other Services       Precautions / Restrictions Precautions Precautions: Fall Restrictions Weight Bearing Restrictions: No Other Position/Activity Restrictions: cortrack, nephrostomy tube      Mobility  Bed Mobility Overal bed mobility: Needs Assistance Bed Mobility: Supine to Sit;Sit to Supine      Supine to sit: Mod assist Sit to supine: Mod assist;+2 for physical assistance   General bed mobility comments: once L bed rail put down, pt throws LLE off bed and uses L arm to sit self up, unable to get RLE off bed, needs mod A for this and to square hips EOB. Able to get LLE back up into bed for return to supine, mod A at RLE and mod A at trunk for controlling sit to supine.   Transfers                 General transfer comment: was going to attempt sit<>stand but pt initiated return to supine after sitting EOB 10 mins  Ambulation/Gait             General Gait Details: apparently unable at baseline  Stairs            Wheelchair Mobility    Modified Rankin (Stroke Patients Only) Modified Rankin (Stroke Patients Only) Pre-Morbid Rankin Score: Moderately severe disability Modified Rankin: Severe disability     Balance Overall balance assessment: Needs assistance Sitting-balance support: Single extremity supported;Feet supported Sitting balance-Leahy Scale: Poor Sitting balance - Comments: poor control of R side body, unable to gauge proper scoot to EOB and sometimes coming too close. Needed min A for safety  Pertinent Vitals/Pain Pain Assessment: Faces Faces Pain Scale: No hurt    Home Living Family/patient expects to be discharged to:: Skilled nursing facility                      Prior Function Level of Independence: Needs assistance   Gait / Transfers Assistance Needed: upon last admission was independently transferring in and out of w/c, sister confirmed this as well  ADL's / Homemaking Assistance Needed: assume assist needed at SNF but sister unable to verify this as family has not been allowed to visit past year        Hand Dominance        Extremity/Trunk Assessment   Upper Extremity Assessment Upper Extremity Assessment: RUE deficits/detail;LUE deficits/detail;Difficult to  assess due to impaired cognition RUE Deficits / Details: R hand fingers flexed, concerned about nails into palm. Able to passively move fingers into extension. Did not witness any purposeful mvmt RUE LUE Deficits / Details: able to use LUE to grasp rail and pull self into sitting. Unable to fully assess due to pt not following commands consistently    Lower Extremity Assessment Lower Extremity Assessment: RLE deficits/detail;LLE deficits/detail;Difficult to assess due to impaired cognition RLE Deficits / Details: RLE swollen, does not like he has done significant WB'ing on it in some time. Unable to lift RLE against gravity RLE Sensation: decreased proprioception RLE Coordination: decreased gross motor LLE Deficits / Details: hip abd >3/5, hip flex >3/5, knee ext >2/5 LLE Sensation: (seems in tact) LLE Coordination: WNL    Cervical / Trunk Assessment Cervical / Trunk Assessment: Kyphotic  Communication   Communication: Receptive difficulties;Expressive difficulties  Cognition Arousal/Alertness: Lethargic Behavior During Therapy: Flat affect Overall Cognitive Status: Impaired/Different from baseline Area of Impairment: Following commands;Awareness;Problem solving                       Following Commands: Follows one step commands inconsistently   Awareness: Intellectual Problem Solving: Slow processing;Decreased initiation;Difficulty sequencing;Requires verbal cues;Requires tactile cues General Comments: per pt's sister, he was calling his mother on phone about 5x/day and conversing with her until about 2 wks ago. Today only said, "Quamain Czarnecki" and occasionally an affirmative grunt      General Comments General comments (skin integrity, edema, etc.): R gaze preference noted. HR low 100's sitting EOB, SpO2 stable in 90's on RA    Exercises     Assessment/Plan    PT Assessment Patient needs continued PT services  PT Problem List Decreased strength;Decreased range of  motion;Decreased activity tolerance;Decreased balance;Decreased mobility;Decreased coordination;Decreased cognition;Decreased knowledge of use of DME;Decreased safety awareness;Decreased knowledge of precautions;Impaired sensation;Impaired tone       PT Treatment Interventions DME instruction;Functional mobility training;Therapeutic activities;Therapeutic exercise;Balance training;Neuromuscular re-education;Cognitive remediation;Patient/family education    PT Goals (Current goals can be found in the Care Plan section)  Acute Rehab PT Goals Patient Stated Goal: unable to state PT Goal Formulation: Patient unable to participate in goal setting Time For Goal Achievement: 02/07/20 Potential to Achieve Goals: Fair    Frequency Min 3X/week   Barriers to discharge        Co-evaluation               AM-PAC PT "6 Clicks" Mobility  Outcome Measure Help needed turning from your back to your side while in a flat bed without using bedrails?: A Lot Help needed moving from lying on your back to sitting on the side of a flat bed without  using bedrails?: A Lot Help needed moving to and from a bed to a chair (including a wheelchair)?: A Lot Help needed standing up from a chair using your arms (e.g., wheelchair or bedside chair)?: Total Help needed to walk in hospital room?: Total Help needed climbing 3-5 steps with a railing? : Total 6 Click Score: 9    End of Session   Activity Tolerance: Patient tolerated treatment well Patient left: in bed;with bed alarm set;with call bell/phone within reach Nurse Communication: Mobility status PT Visit Diagnosis: Muscle weakness (generalized) (M62.81);Difficulty in walking, not elsewhere classified (R26.2)    Time: [id number] PT Time Calculation (min) (ACUTE ONLY): 29 min   Charges:   PT Evaluation $PT Eval Moderate Complexity: 1 Mod PT Treatments $Therapeutic Activity: 8-22 mins        Leighton Roach, PT  Acute Rehab Services  Pager  [phone] Office [place] 01/24/2020, 2:01 PM

## 2020-01-25 ENCOUNTER — Inpatient Hospital Stay (HOSPITAL_COMMUNITY): Payer: Medicaid Other

## 2020-01-25 DIAGNOSIS — G9340 Encephalopathy, unspecified: Secondary | ICD-10-CM

## 2020-01-25 DIAGNOSIS — N184 Chronic kidney disease, stage 4 (severe): Secondary | ICD-10-CM

## 2020-01-25 DIAGNOSIS — S065X0D Traumatic subdural hemorrhage without loss of consciousness, subsequent encounter: Secondary | ICD-10-CM

## 2020-01-25 DIAGNOSIS — N139 Obstructive and reflux uropathy, unspecified: Secondary | ICD-10-CM

## 2020-01-25 LAB — BASIC METABOLIC PANEL
Anion gap: 14 (ref 5–15)
BUN: 40 mg/dL — ABNORMAL HIGH (ref 6–20)
CO2: 17 mmol/L — ABNORMAL LOW (ref 22–32)
Calcium: 8.8 mg/dL — ABNORMAL LOW (ref 8.9–10.3)
Chloride: 115 mmol/L — ABNORMAL HIGH (ref 98–111)
Creatinine, Ser: 2.54 mg/dL — ABNORMAL HIGH (ref 0.61–1.24)
GFR calc Af Amer: 31 mL/min — ABNORMAL LOW (ref >60)
GFR calc non Af Amer: 26 mL/min — ABNORMAL LOW (ref >60)
Glucose, Bld: 140 mg/dL — ABNORMAL HIGH (ref 70–99)
Potassium: 4.5 mmol/L (ref 3.5–5.1)
Sodium: 146 mmol/L — ABNORMAL HIGH (ref 135–145)

## 2020-01-25 LAB — GLUCOSE, CAPILLARY
Glucose-Capillary: 138 mg/dL — ABNORMAL HIGH (ref 70–99)
Glucose-Capillary: 128 mg/dL — ABNORMAL HIGH (ref 70–99)
Glucose-Capillary: 124 mg/dL — ABNORMAL HIGH (ref 70–99)
Glucose-Capillary: 109 mg/dL — ABNORMAL HIGH (ref 70–99)
Glucose-Capillary: 133 mg/dL — ABNORMAL HIGH (ref 70–99)
Glucose-Capillary: 121 mg/dL — ABNORMAL HIGH (ref 70–99)

## 2020-01-25 LAB — CBC WITH DIFFERENTIAL/PLATELET
Abs Immature Granulocytes: 0.21 10*3/uL — ABNORMAL HIGH (ref 0.00–0.07)
Basophils Absolute: 0 10*3/uL (ref 0.0–0.1)
Basophils Relative: 0 %
Eosinophils Absolute: 0 10*3/uL (ref 0.0–0.5)
Eosinophils Relative: 0 %
HCT: 28.1 % — ABNORMAL LOW (ref 39.0–52.0)
Hemoglobin: 8.2 g/dL — ABNORMAL LOW (ref 13.0–17.0)
Immature Granulocytes: 2 %
Lymphocytes Relative: 8 %
Lymphs Abs: 0.8 10*3/uL (ref 0.7–4.0)
MCH: 27.3 pg (ref 26.0–34.0)
MCHC: 29.2 g/dL — ABNORMAL LOW (ref 30.0–36.0)
MCV: 93.7 fL (ref 80.0–100.0)
Monocytes Absolute: 0.9 10*3/uL (ref 0.1–1.0)
Monocytes Relative: 9 %
Neutro Abs: 7.9 10*3/uL — ABNORMAL HIGH (ref 1.7–7.7)
Neutrophils Relative %: 81 %
Platelets: 325 10*3/uL (ref 150–400)
RBC: 3 MIL/uL — ABNORMAL LOW (ref 4.22–5.81)
RDW: 19.1 % — ABNORMAL HIGH (ref 11.5–15.5)
WBC: 9.8 10*3/uL (ref 4.0–10.5)
nRBC: 0.3 % — ABNORMAL HIGH (ref 0.0–0.2)

## 2020-01-25 LAB — CALCIUM, IONIZED: Calcium, Ionized, Serum: 4.8 mg/dL (ref 4.5–5.6)

## 2020-01-25 MED ORDER — MYCOPHENOLATE 200 MG/ML ORAL SUSPENSION
500.0000 mg | Freq: Two times a day (BID) | ORAL | Status: DC
  Filled 2020-01-25: qty 10

## 2020-01-25 MED ORDER — CEFAZOLIN SODIUM-DEXTROSE 2-4 GM/100ML-% IV SOLN
2.0000 g | INTRAVENOUS | Status: AC
  Administered 2020-01-26: 2 g via INTRAVENOUS
  Filled 2020-01-25 (×2): qty 100

## 2020-01-25 MED ORDER — OXYCODONE HCL 5 MG PO TABS
5.0000 mg | ORAL_TABLET | Freq: Three times a day (TID) | ORAL | Status: DC | PRN
  Administered 2020-01-26 – 2020-01-27 (×2): 5 mg via ORAL
  Filled 2020-01-25 (×2): qty 1

## 2020-01-25 MED ORDER — ACETAMINOPHEN 160 MG/5ML PO SOLN
325.0000 mg | ORAL | Status: DC | PRN
  Administered 2020-02-05 – 2020-02-13 (×7): 325 mg via ORAL
  Filled 2020-01-25 (×8): qty 20.3

## 2020-01-25 MED ORDER — DOCUSATE SODIUM 50 MG/5ML PO LIQD
50.0000 mg | Freq: Every day | ORAL | Status: DC
  Administered 2020-01-25 – 2020-01-27 (×2): 50 mg via ORAL
  Filled 2020-01-25 (×2): qty 10

## 2020-01-25 MED ORDER — OXYCODONE HCL 5 MG PO TABS
5.0000 mg | ORAL_TABLET | Freq: Three times a day (TID) | ORAL | Status: DC | PRN
  Administered 2020-01-25: 5 mg
  Filled 2020-01-25: qty 1

## 2020-01-25 MED ORDER — OSMOLITE 1.5 CAL PO LIQD
1000.0000 mL | ORAL | Status: DC
  Filled 2020-01-25: qty 1000

## 2020-01-25 MED ORDER — PRO-STAT SUGAR FREE PO LIQD: 30.0000 mL | Freq: Two times a day (BID) | ORAL | Status: DC

## 2020-01-25 MED ORDER — POLYETHYLENE GLYCOL 3350 17 G PO PACK: 17.0000 g | PACK | Freq: Every day | ORAL | Status: DC | PRN

## 2020-01-25 MED ORDER — ACETAMINOPHEN 160 MG/5ML PO SOLN: 325.0000 mg | ORAL | Status: DC | PRN

## 2020-01-25 MED ORDER — MYCOPHENOLATE 200 MG/ML ORAL SUSPENSION
500.0000 mg | Freq: Two times a day (BID) | ORAL | Status: DC
  Administered 2020-01-25 – 2020-01-26 (×3): 500 mg via ORAL
  Filled 2020-01-25 (×3): qty 10

## 2020-01-25 NOTE — Evaluation (Addendum)
Occupational Therapy Evaluation Patient Details Name: Gevon Spradley MRN: [id number] DOB: [birth year] Today's Date: 01/25/2020    History of Present Illness This is a 59 year old nursing home resident with DM, L CVA 2009, and CAD who is chronically anticoagulated with Lovenox due to the presence of a right lower extremity DVT despite the fact that he has a IVC filter in place.  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  He came to medical attention at his [place] home because of aphasia and weakness.  A CT scan of the head has shown acute on chronic subdural hematoma.   Clinical Impression   PTA, pt was living at [facility] and was performing stand pivot to w/c; assume he required assistance for BADLs. Information about PLOF collected from sister via phone call by SLP. Pt currently requiring Mod A for UB ADLs, Max A for LB ADLs, and Min-Mod a for bed mobility. Pt presenting with decreased functional use of RUE, balance, cognition, and activity tolerance. Pt following 75% of simple, direct commands to perform exercises, bed mobility, and grooming and answering 25% of questions with either "yea" or affirmative moans. Pt would benefit from further acute OT to facilitate safe dc. Recommend dc to SNF for further OT to optimize safety, independence with ADLs, and return to PLOF.   Issued left palm protector     Follow Up Recommendations  SNF;Supervision/Assistance - 24 hour    Equipment Recommendations  Other (comment)(Defer to next venue)    Recommendations for Other Services PT consult     Precautions / Restrictions Precautions Precautions: Fall Restrictions Weight Bearing Restrictions: No Other Position/Activity Restrictions: cortrack, nephrostomy tube      Mobility Bed Mobility Overal bed mobility: Needs Assistance Bed Mobility: Supine to Sit;Sit to Supine     Supine to sit: Mod assist Sit to supine: Min  assist   General bed mobility comments: Pt using bedrails to pull into sitting. Requiring Mod A for managing RLE. Pt able to return to supine with Min A for returning to bed  Transfers                 General transfer comment: Defered    Balance Overall balance assessment: Needs assistance Sitting-balance support: Single extremity supported;Feet supported Sitting balance-Leahy Scale: Fair Sitting balance - Comments: Pt able to maintain sitting at EOB for Min guard A                                   ADL either performed or assessed with clinical judgement   ADL Overall ADL's : Needs assistance/impaired Eating/Feeding: NPO   Grooming: Wash/dry face;Oral care;Min guard;Sitting;Moderate assistance Grooming Details (indicate cue type and reason): Min guard A to wash his face while sitting at EOB; using LUE. Pt also brushing his teeth with tooth brush using LUE; requiring Mod A for bilateral tasks Upper Body Bathing: Moderate assistance;Sitting   Lower Body Bathing: Maximal assistance;Bed level;Sitting/lateral leans   Upper Body Dressing : Moderate assistance;Sitting   Lower Body Dressing: Maximal assistance;Bed level Lower Body Dressing Details (indicate cue type and reason): Max A for donning socks               General ADL Comments: Pt presenting with decreased functional use or RUE, balance, and cognition.      Vision   Additional Comments: Pt tracking to left and right.  Making eye contacting with therapy.      Perception     Praxis      Pertinent Vitals/Pain Pain Assessment: Faces Faces Pain Scale: No hurt Pain Intervention(s): Monitored during session     Hand Dominance Left(R hemi from prior CVA)   Extremity/Trunk Assessment Upper Extremity Assessment Upper Extremity Assessment: RUE deficits/detail;LUE deficits/detail RUE Deficits / Details: No active movement. Tendency for flexion posture with hand in fist. Able to perform PROM into  extension. Issued and donned palm protector.  RUE Coordination: decreased fine motor;decreased gross motor LUE Deficits / Details: Pt demonstrating AROM to perform forward flexion of shoulder, elbow flex/ext, and hand composite flexion/extenion. Using RUE to use with bed mobility and during grooming tasks    Lower Extremity Assessment Lower Extremity Assessment: Defer to PT evaluation   Cervical / Trunk Assessment Cervical / Trunk Assessment: Kyphotic;Other exceptions Cervical / Trunk Exceptions: Tendency for forward head flexion   Communication Communication Communication: Receptive difficulties;Expressive difficulties   Cognition Arousal/Alertness: Lethargic Behavior During Therapy: Flat affect Overall Cognitive Status: Impaired/Different from baseline Area of Impairment: Following commands;Awareness;Problem solving                       Following Commands: Follows one step commands inconsistently   Awareness: Intellectual Problem Solving: Slow processing;Decreased initiation;Difficulty sequencing;Requires verbal cues;Requires tactile cues General Comments: Pt following simple commands ~70% of time. Requiring increased time. Pt responding to questions with "yea" or afformative grunts/moans ~25% of time.    General Comments  HR in 90s. SpO2 90s on RA. BP stable.    Exercises Exercises: General Upper Extremity;General Lower Extremity General Exercises - Upper Extremity Shoulder Flexion: PROM;Right;10 reps;Supine Shoulder Extension: PROM;Right;10 reps;Supine Elbow Flexion: PROM;Right;10 reps;Supine Elbow Extension: PROM;Right;10 reps;Supine Wrist Flexion: PROM;Right;10 reps;Supine Wrist Extension: PROM;Right;10 reps;Supine General Exercises - Lower Extremity Long Arc Quad: AROM;Left;10 reps;Seated   Shoulder Instructions      Home Living Family/patient expects to be discharged to:: Skilled nursing facility   Available Help at Discharge: Available 24  hours/day;Skilled Nursing Facility Type of Home: [place]                                  Prior Functioning/Environment Level of Independence: Needs assistance  Gait / Transfers Assistance Needed: upon last admission was independently transferring in and out of w/c, sister confirmed this as well ADL's / Homemaking Assistance Needed: assume assist needed at SNF but sister unable to verify this as family has not been allowed to visit past year Communication / Swallowing Assistance Needed: was feeding himself Comments: per pt's sister, he was calling his mother on phone about 5x/day and conversing with her until about 2 wks ago.        OT Problem List: Decreased strength;Decreased range of motion;Decreased activity tolerance;Impaired balance (sitting and/or standing);Decreased safety awareness;Decreased coordination;Decreased cognition;Decreased knowledge of use of DME or AE;Decreased knowledge of precautions;Impaired UE functional use      OT Treatment/Interventions: Self-care/ADL training;Neuromuscular education;Therapeutic exercise;Therapeutic activities;Patient/family education;Balance training;DME and/or AE instruction    OT Goals(Current goals can be found in the care plan section) Acute Rehab OT Goals Patient Stated Goal: unable to state OT Goal Formulation: With patient Time For Goal Achievement: 02/08/20 Potential to Achieve Goals: Good  OT Frequency: Min 2X/week   Barriers to D/C:            Co-evaluation  AM-PAC OT "6 Clicks" Daily Activity     Outcome Measure Help from another person eating meals?: Total Help from another person taking care of personal grooming?: A Lot Help from another person toileting, which includes using toliet, bedpan, or urinal?: A Lot Help from another person bathing (including washing, rinsing, drying)?: A Lot Help from another person to put on and taking off regular upper body clothing?: A  Lot Help from another person to put on and taking off regular lower body clothing?: A Lot 6 Click Score: 11   End of Session Nurse Communication: Mobility status  Activity Tolerance: Patient tolerated treatment well;Patient limited by fatigue Patient left: in bed;with call bell/phone within reach;with bed alarm set  OT Visit Diagnosis: Unsteadiness on feet (R26.81);Other abnormalities of gait and mobility (R26.89);Muscle weakness (generalized) (M62.81)                Time: [id number] OT Time Calculation (min): 27 min Charges:  OT General Charges $OT Visit: 1 Visit OT Evaluation $OT Eval Moderate Complexity: 1 Mod OT Treatments $Self Care/Home Management : 8-22 mins  Aisley Whan MSOT, OTR/L Acute Rehab Pager: [phone] Office: [place] 01/25/2020, 12:22 PM

## 2020-01-25 NOTE — Progress Notes (Addendum)
NAME:  Brendan [name], MRN:  [id number], DOB:  1961/01/31, LOS: 2 ADMISSION DATE:  01/22/2020, CHIEF COMPLAINT:  Aphasia and weakness   Brief History   This is a 59 year old nursing home resident with Brendan [name] with acute on chronic subdural hematoma.  On lovenox for RLE DVT. He was Sent to [facility] from SNF where he was felt to have worsening left sided weakness and aphasia starting at 1800 on 2/18 but had been noted to not be himself earlier that day.  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  He came to medical attention at his nursing home because of aphasia and weakness.  A CT scan of the head has shown acute on chronic subdural hematoma.  There is also some encephalomalacia on the left underlying the hematoma consequently there is no midline shift.   Also having worsening hematuria. Neurosurgery has evaluated the patient and is following. His lovenox was reversed with protamine on admission. PCCM contacted for admission.  Past Medical History  Otherwise remarkable for coronary disease diabetes history of DVT retroperitoneal fibrosis, prior CVA and IVC filter placement Significant Hospital Events   2/18 >>admission  Consults:  Neurosurgery Nephrology  Procedures:    Significant Diagnostic Tests:  2/21 CT Head demonstrates: IMPRESSION: Some further enlargement of the subdural hematoma on the left. Overall volume increase estimated at about 1/3. Maximal thickness now measured at 2.5 cm compared with 2 cm previously. More hyperdense component. Summary distribution, including along the posterior falx and the tentorium. Increased mass effect with left-to-right shift of 7 mm today, increased from 4 mm previously.  2/20 KUB - no IVC filter in place  Micro Data:  2/19 Urine cultures - polymicrobial  2/19 blood cultures - negative  Antimicrobials:  Cefepime 2/19 >>  Interim history/subjective:  No overnight  issues. Afebrile. Talking a little more.   Per nursing: At baseline he talks and has had decreased mentation over the last 2 weeks. Usually calls mom on the phone frequently. Was due for nephrostomy tube change on 3/3.  CT head overnight showed worsening midline shift.  Objective   Blood pressure (!) 144/73, pulse 96, temperature 97.8 F (36.6 C), temperature source Oral, resp. rate 16, height 6\' 1"  (1.854 m), weight 110.3 kg, SpO2 100 %.        Intake/Output Summary (Last 24 hours) at 01/25/2020 0914 Last data filed at 01/25/2020 0800 Gross per 24 hour  Intake 2150.54 ml  Output 1900 ml  Net 250.54 ml   Filed Weights   01/22/20 2158 01/23/20 1500  Weight: 112 kg 110.3 kg    Examination: General: elderly, no distress, resting comfortably HENT: mmm, poor dentition.  Lungs: Respirations are unlabored, room air, no distress, no wheezes or crackles.  Cardiovascular: RRR, no mrg Abdomen: soft, nontender, nondistended.  Extremities: RLE edema. Bilateral lower extremity venous stasis dermatitis.  Neuro: alert, nonverbal. Does not follow commands. Does move his LUE and LLE spontaneously. Does not move his RUE or LUE. Pupils are reactive.  GU: The nephrostomy drainage is still blood-tinged.  Resolved Hospital Problem list     Assessment & Plan:  Brendan [name] is a 59 yo gentleman with a past medical history of CKD, HTN and obstructive uropathy with nephrostomy tubes who lives in a SNF and presented on 2/19 with:  Acute encephalopathy secondary to: Acute on chronic subdural hematoma  Worsening lethargy and  aphasia consistent with location of hematoma.  Worsening midline shift overnight.  - NPO pending neurosurgery recommendations today. Otherwise will start tube feeds. - Neurovitals q1h - No seizure prophylaxis.  DVT right leg s/p treatment with enoxaparin due to Xarelto failure. - Anticoagulation on hold due to bleed. - based on KUB obtained 2/20 he does not have an IVC filter in  place. Given his persistent clot, inability to tolerate AC due to subdural hematoma, and relative immobility, would consult IR Monday for IVC filter placement.  - would resume anticoagulation if/when able per neurosurgery  Stage IV renal disease - secondary to obstructive uropathy with bilateral nephrostomy tubes - nephrology following, hopefully can exchange nephrostomy tubes while he is inpatient as he was scheduled on 3/3 anyway.  Hematuria - flush nephrostomy tubes per nephrology  Multifactorial anemia due to acute/chronic blood loss and chronic renal disease.  - Transfuse to keep HB>7.0  Fever - on admission - received 48 hours empiric cefepime. No evidence of pneumonia, UTI or positive blood cultures. - nephrostomy tube output was polymicrobial, suspect colonization.  - will discontinue abx.   Best practice:  Diet: will start tube feed. Advance bowel regimen. Pain/Anxiety/Delirium protocol (if indicated): Not indicated VAP protocol (if indicated): Not currently indicated DVT prophylaxis: will placed SCD on the left side, right leg has DVT.  GI prophylaxis: Pepcid ordered Glucose control: Sliding scale insulin ordered Mobility: Bedrest Code Status: Full Disposition: needs ICU.   Labs   CBC: Recent Labs  Lab 01/22/20 2213 01/22/20 2213 01/23/20 0235 01/23/20 1400 01/23/20 1900 01/24/20 0517 01/25/20 0504  WBC 9.7  --   --  10.4 11.5* 10.8* 9.8  NEUTROABS 7.9*  --   --   --  9.7*  --  7.9*  HGB 6.4*  --   --  8.0* 7.9* 8.1* 8.2*  HCT 22.2*  --   --  25.7* 26.5* 27.1* 28.1*  MCV 94.5  --   --  89.9 91.4 92.2 93.7  PLT 376   < > 386 310 319 309 325   < > = values in this interval not displayed.    Basic Metabolic Panel: Recent Labs  Lab 01/22/20 2213 01/23/20 1400 01/24/20 0517 01/25/20 0504  NA 134* 138 141 146*  K 4.9 4.6 4.6 4.5  CL 99 106 108 115*  CO2 22 20* 19* 17*  GLUCOSE 171* 156* 138* 140*  BUN 64* 55* 47* 40*  CREATININE 3.38* 3.11* 2.95*  2.54*  CALCIUM 8.7* 8.6* 8.6* 8.8*   GFR: Estimated Creatinine Clearance: 40.3 mL/min (A) (by C-G formula based on SCr of 2.54 mg/dL (H)). Recent Labs  Lab 01/23/20 1400 01/23/20 1645 01/23/20 1900 01/24/20 0517 01/24/20 0842 01/24/20 1031 01/25/20 0504  WBC 10.4  --  11.5* 10.8*  --   --  9.8  LATICACIDVEN 1.0 2.6*  --   --  3.1* 1.9  --     Liver Function Tests: Recent Labs  Lab 01/22/20 2213 01/24/20 0517  AST 57* 48*  ALT 88* 76*  ALKPHOS 70 55  BILITOT 0.7 1.3*  PROT 7.6 7.2  ALBUMIN 3.4* 2.9*   No results for input(s): LIPASE, AMYLASE in the last 168 hours. No results for input(s): AMMONIA in the last 168 hours.  ABG    Component Value Date/Time   TCO2 25 01/13/2020 1007     Coagulation Profile: Recent Labs  Lab 01/22/20 2213 01/23/20 0235  INR 1.2 1.2    Cardiac Enzymes: No results for input(s): CKTOTAL, CKMB, CKMBINDEX, TROPONINI in the last 168  hours.  HbA1C: Hgb A1c MFr Bld  Date/Time Value Ref Range Status  01/24/2020 05:17 AM 5.0 4.8 - 5.6 % Final    Comment:    (NOTE) Pre diabetes:          5.7%-6.4% Diabetes:              >6.4% Glycemic control for   <7.0% adults with diabetes   01/23/2020 04:32 PM 5.2 4.8 - 5.6 % Final    Comment:    (NOTE) Pre diabetes:          5.7%-6.4% Diabetes:              >6.4% Glycemic control for   <7.0% adults with diabetes     CBG: Recent Labs  Lab 01/24/20 1526 01/24/20 2222 01/24/20 2315 01/25/20 0338 01/25/20 0726  GLUCAP 157* 127* 132* 138* 128*

## 2020-01-25 NOTE — Progress Notes (Signed)
Patient stable today be slightly improved from a clinical standpoint.  He is awake.  He is attempting to verbalize more than yesterday.  He follows commands readily with his left upper and lower extremity.  Right-sided chronic weakness unchanged.  Follow-up head CT scan this morning does demonstrate some progression of his acute on chronic left convexity subdural hematoma.  There is been some mild increase in mass-effect.  I discussed situation with Dr. Christella Noa.  We are in agreement that the patient's subdural is slowly enlarging and unlikely going to be able to resolve on its own.  Currently plan for Dr. Christella Noa to take the patient to the operating room tomorrow afternoon for craniotomy and evacuation of subdural hematoma.

## 2020-01-25 NOTE — Evaluation (Signed)
Clinical/Bedside Swallow Evaluation Patient Details  Name: Brendan [name] MRN: [id number] Date of Birth: 1961/04/28  Today's Date: 01/25/2020 Time: SLP Start Time (ACUTE ONLY): 1055 SLP Stop Time (ACUTE ONLY): 1113 SLP Time Calculation (min) (ACUTE ONLY): 18 min  Past Medical History:  Past Medical History:  Diagnosis Date  . Acidosis   . Acute unilateral obstructive uropathy   . Allergic rhinitis   . Chronic kidney disease, stage IV (severe) ([place])   . Constipation   . Coronary atherosclerosis of native coronary artery   . Diabetes mellitus without complication ([place])   . DVT (deep venous thrombosis) ([place])   . Enlarged prostate with urinary obstruction   . Essential hypertension, malignant   . GERD (gastroesophageal reflux disease)   . Glaucoma   . Hemiparesis affecting right side as late effect of cerebrovascular accident ([place])   . Hemiplegia ([place])   . Hydronephrosis   . Hyperlipidemia   . Hypertension   . Hypokalemia   . Muscle weakness (generalized)   . PVD (peripheral vascular disease) ([place])   . Retroperitoneal fibrosis   . Stricture or kinking of ureter   . Stroke [facility])    right sided hemiparesis  . Vitamin D deficiency    Past Surgical History:  Past Surgical History:  Procedure Laterality Date  . INTRAVASCULAR ULTRASOUND/IVUS N/A 01/13/2020   Procedure: Intravascular Ultrasound/IVUS;  Surgeon: Serafina Mitchell, MD;  Location: [place] [facility];  Service: Cardiovascular;  Laterality: N/A;  IJ, SVC, IVC  . IR GENERIC HISTORICAL  11/09/2016   IR NEPHROSTOMY EXCHANGE LEFT 11/09/2016 Corrie Mckusick, DO [facility]-INTERV RAD  . IR GENERIC HISTORICAL  01/09/2017   IR NEPHROSTOMY EXCHANGE LEFT 01/09/2017 [name], MD [facility]-INTERV RAD  . IR NEPHROSTOGRAM LEFT THRU EXISTING ACCESS  11/12/2019  . IR NEPHROSTOMY EXCHANGE LEFT  03/06/2017  . IR NEPHROSTOMY EXCHANGE LEFT  04/13/2017  . IR NEPHROSTOMY EXCHANGE LEFT  06/08/2017  . IR NEPHROSTOMY EXCHANGE LEFT  07/20/2017  . IR NEPHROSTOMY EXCHANGE  LEFT  08/03/2017  . IR NEPHROSTOMY EXCHANGE LEFT  09/28/2017  . IR NEPHROSTOMY EXCHANGE LEFT  11/29/2017  . IR NEPHROSTOMY EXCHANGE LEFT  01/31/2018  . IR NEPHROSTOMY EXCHANGE LEFT  04/04/2018  . IR NEPHROSTOMY EXCHANGE LEFT  06/27/2018  . IR NEPHROSTOMY EXCHANGE LEFT  09/19/2018  . IR NEPHROSTOMY EXCHANGE LEFT  12/10/2018  . IR NEPHROSTOMY EXCHANGE LEFT  06/26/2019  . IR NEPHROSTOMY EXCHANGE LEFT  10/16/2019  . IVC VENOGRAPHY N/A 01/13/2020   Procedure: IVC Venography;  Surgeon: Serafina Mitchell, MD;  Location: [place] [facility];  Service: Cardiovascular;  Laterality: N/A;  . LOWER EXTREMITY VENOGRAPHY Left 01/13/2020   Procedure: LOWER EXTREMITY VENOGRAPHY;  Surgeon: Serafina Mitchell, MD;  Location: [place] [facility];  Service: Cardiovascular;  Laterality: Left;  . NEPHROSTOMY TUBE PLACEMENT ([place] [facility])     HPI:  This is a 59 year old nursing home resident with DM, L CVA 2009, and CAD who is chronically anticoagulated with Lovenox due to the presence of a right lower extremity DVT despite the fact that he has a IVC filter in place.  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  He came to medical attention at his [place] home because of aphasia and weakness.  A CT scan of the head has shown acute on chronic subdural hematoma.   Assessment / Plan / Recommendation Clinical Impression  Repeat clinical swallowing evaluation was completed using thin liquids via  spoon, cup and straw, pureed material and dual textured solids.  The patient currently has a cortrak in place for nutrition/medication.  RN reported he is lethargic but does rouse.  He was non verbal this date with limited direction following.  He was agreeable with oral care using suction swabs and seemed eager for oral intake.  Mastication of dry solids was adequate for dual/soft textured solids with no oral residue noted post swallow.  Swallow trigger was appreciated to  palpation with no overt s/s of aspiration noted even given large serial straw sips.  Recommend begin a dysphagia 2 diet with thin liquids.  ST will continue to follow for therapeutic diet tolerance and possible diet advancement.    SLP Visit Diagnosis: Dysphagia, unspecified (R13.10)    Aspiration Risk  Mild aspiration risk    Diet Recommendation   Dysphagia 2 with thin liquids  Medication Administration: Whole meds with liquid    Other  Recommendations Oral Care Recommendations: Oral care BID;Staff/trained caregiver to provide oral care   Follow up Recommendations 24 hour supervision/assistance;Skilled Nursing facility      Frequency and Duration min 2x/week  2 weeks       Prognosis Prognosis for Safe Diet Advancement: Good Barriers to Reach Goals: Language deficits;Cognitive deficits      Swallow Study   General Date of Onset: 01/23/20 HPI: This is a 59 year old nursing home resident with DM, L CVA 2009, and CAD who is chronically anticoagulated with Lovenox due to the presence of a right lower extremity DVT despite the fact that he has a IVC filter in place.  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  He came to medical attention at his [place] home because of aphasia and weakness.  A CT scan of the head has shown acute on chronic subdural hematoma. Type of Study: Bedside Swallow Evaluation Previous Swallow Assessment: yesterday with recommendation for ice chips Diet Prior to this Study: NPO Temperature Spikes Noted: No Respiratory Status: Room air History of Recent Intubation: No Behavior/Cognition: Alert;Cooperative;Doesn't follow directions Oral Cavity Assessment: Within Functional Limits Oral Care Completed by SLP: Yes Oral Cavity - Dentition: Missing dentition;Poor condition Self-Feeding Abilities: Total assist Patient Positioning: Upright in bed Baseline Vocal Quality: Not  observed Volitional Cough: Cognitively unable to elicit Volitional Swallow: Unable to elicit    Oral/Motor/Sensory Function Overall Oral Motor/Sensory Function: (Unable to assess this date.)   Ice Chips     Thin Liquid Thin Liquid: Within functional limits Presentation: Cup;Spoon;Straw    Nectar Thick Nectar Thick Liquid: Not tested   Honey Thick Honey Thick Liquid: Not tested   Puree Puree: Impaired Oral Phase Impairments: Impaired mastication Oral Phase Functional Implications: Prolonged oral transit   Solid     Solid: Impaired Presentation: Spoon Oral Phase Impairments: Impaired mastication Oral Phase Functional Implications: Prolonged oral transit     Shelly Flatten, MA, CCC-SLP Acute Rehab SLP [id number]  Lamar Sprinkles 01/25/2020,11:27 AM

## 2020-01-26 ENCOUNTER — Inpatient Hospital Stay (HOSPITAL_COMMUNITY): Payer: Medicaid Other

## 2020-01-26 ENCOUNTER — Encounter (HOSPITAL_COMMUNITY)
Admission: EM | Disposition: A | Discharge status: Skilled Nursing Facility | Payer: Self-pay | Source: Skilled Nursing Facility | Attending: Emergency Medicine

## 2020-01-26 ENCOUNTER — Inpatient Hospital Stay (HOSPITAL_COMMUNITY): Payer: Medicaid Other | Admitting: Certified Registered Nurse Anesthetist

## 2020-01-26 DIAGNOSIS — N135 Crossing vessel and stricture of ureter without hydronephrosis: Secondary | ICD-10-CM

## 2020-01-26 DIAGNOSIS — S065XAA Traumatic subdural hemorrhage with loss of consciousness status unknown, initial encounter: Secondary | ICD-10-CM | POA: Diagnosis present

## 2020-01-26 DIAGNOSIS — S065X9A Traumatic subdural hemorrhage with loss of consciousness of unspecified duration, initial encounter: Secondary | ICD-10-CM

## 2020-01-26 HISTORY — PX: IR NEPHROSTOMY EXCHANGE LEFT: IMG6069

## 2020-01-26 HISTORY — PX: CRANIOTOMY: SHX93

## 2020-01-26 LAB — CBC
HCT: 28.4 % — ABNORMAL LOW (ref 39.0–52.0)
Hemoglobin: 8.2 g/dL — ABNORMAL LOW (ref 13.0–17.0)
MCH: 27.4 pg (ref 26.0–34.0)
MCHC: 28.9 g/dL — ABNORMAL LOW (ref 30.0–36.0)
MCV: 95 fL (ref 80.0–100.0)
Platelets: 264 10*3/uL (ref 150–400)
RBC: 2.99 MIL/uL — ABNORMAL LOW (ref 4.22–5.81)
RDW: 18.8 % — ABNORMAL HIGH (ref 11.5–15.5)
WBC: 7.2 10*3/uL (ref 4.0–10.5)
nRBC: 0.8 % — ABNORMAL HIGH (ref 0.0–0.2)

## 2020-01-26 LAB — BASIC METABOLIC PANEL
Anion gap: 12 (ref 5–15)
BUN: 37 mg/dL — ABNORMAL HIGH (ref 6–20)
CO2: 19 mmol/L — ABNORMAL LOW (ref 22–32)
Calcium: 8.6 mg/dL — ABNORMAL LOW (ref 8.9–10.3)
Chloride: 116 mmol/L — ABNORMAL HIGH (ref 98–111)
Creatinine, Ser: 2.45 mg/dL — ABNORMAL HIGH (ref 0.61–1.24)
GFR calc Af Amer: 32 mL/min — ABNORMAL LOW (ref >60)
GFR calc non Af Amer: 28 mL/min — ABNORMAL LOW (ref >60)
Glucose, Bld: 140 mg/dL — ABNORMAL HIGH (ref 70–99)
Potassium: 4.4 mmol/L (ref 3.5–5.1)
Sodium: 147 mmol/L — ABNORMAL HIGH (ref 135–145)

## 2020-01-26 LAB — GLUCOSE, CAPILLARY
Glucose-Capillary: 128 mg/dL — ABNORMAL HIGH (ref 70–99)
Glucose-Capillary: 119 mg/dL — ABNORMAL HIGH (ref 70–99)
Glucose-Capillary: 107 mg/dL — ABNORMAL HIGH (ref 70–99)
Glucose-Capillary: 109 mg/dL — ABNORMAL HIGH (ref 70–99)
Glucose-Capillary: 145 mg/dL — ABNORMAL HIGH (ref 70–99)
Glucose-Capillary: 159 mg/dL — ABNORMAL HIGH (ref 70–99)

## 2020-01-26 LAB — SURGICAL PCR SCREEN
MRSA, PCR: POSITIVE — AB
Staphylococcus aureus: POSITIVE — AB

## 2020-01-26 SURGERY — CRANIOTOMY HEMATOMA EVACUATION SUBDURAL
Anesthesia: General | Site: Head | Laterality: Left

## 2020-01-26 MED ORDER — LABETALOL HCL 5 MG/ML IV SOLN
5.0000 mg | INTRAVENOUS | Status: DC | PRN
  Administered 2020-01-27: 20 mg via INTRAVENOUS
  Filled 2020-01-26: qty 4

## 2020-01-26 MED ORDER — THROMBIN 5000 UNITS EX SOLR
CUTANEOUS | Status: AC
  Filled 2020-01-26: qty 5000

## 2020-01-26 MED ORDER — MYCOPHENOLATE 200 MG/ML ORAL SUSPENSION
500.0000 mg | Freq: Two times a day (BID) | ORAL | Status: DC
  Administered 2020-01-27 – 2020-02-04 (×18): 500 mg
  Filled 2020-01-26 (×20): qty 10

## 2020-01-26 MED ORDER — ROCURONIUM BROMIDE 10 MG/ML (PF) SYRINGE
PREFILLED_SYRINGE | INTRAVENOUS | Status: DC | PRN
  Administered 2020-01-26 (×2): 10 mg via INTRAVENOUS
  Administered 2020-01-26: 50 mg via INTRAVENOUS
  Administered 2020-01-26: 20 mg via INTRAVENOUS

## 2020-01-26 MED ORDER — IOHEXOL 300 MG/ML  SOLN
50.0000 mL | Freq: Once | INTRAMUSCULAR | Status: AC | PRN
  Administered 2020-01-26: 10 mL

## 2020-01-26 MED ORDER — FENTANYL CITRATE (PF) 250 MCG/5ML IJ SOLN
INTRAMUSCULAR | Status: AC
  Filled 2020-01-26: qty 5

## 2020-01-26 MED ORDER — FENTANYL CITRATE (PF) 250 MCG/5ML IJ SOLN
INTRAMUSCULAR | Status: DC | PRN
  Administered 2020-01-26 (×6): 50 ug via INTRAVENOUS

## 2020-01-26 MED ORDER — OXYCODONE HCL 5 MG/5ML PO SOLN: 5.0000 mg | Freq: Once | ORAL | Status: DC | PRN

## 2020-01-26 MED ORDER — FENTANYL CITRATE (PF) 100 MCG/2ML IJ SOLN
INTRAMUSCULAR | Status: AC
  Filled 2020-01-26: qty 2

## 2020-01-26 MED ORDER — LEVETIRACETAM IN NACL 500 MG/100ML IV SOLN
500.0000 mg | Freq: Two times a day (BID) | INTRAVENOUS | Status: DC
  Administered 2020-01-26 – 2020-01-30 (×5): 500 mg via INTRAVENOUS
  Filled 2020-01-26 (×5): qty 100

## 2020-01-26 MED ORDER — THROMBIN 20000 UNITS EX SOLR
CUTANEOUS | Status: AC
  Filled 2020-01-26: qty 20000

## 2020-01-26 MED ORDER — MYCOPHENOLATE 200 MG/ML ORAL SUSPENSION: 500.0000 mg | Freq: Two times a day (BID) | ORAL | Status: DC

## 2020-01-26 MED ORDER — LIDOCAINE HCL (PF) 1 % IJ SOLN
INTRAMUSCULAR | Status: DC | PRN
  Administered 2020-01-26: 10 mL

## 2020-01-26 MED ORDER — PROPOFOL 10 MG/ML IV BOLUS
INTRAVENOUS | Status: DC | PRN
  Administered 2020-01-26: 120 mg via INTRAVENOUS

## 2020-01-26 MED ORDER — MORPHINE SULFATE (PF) 2 MG/ML IV SOLN
2.0000 mg | INTRAVENOUS | Status: DC | PRN
  Administered 2020-01-27: 03:00:00 2 mg via INTRAVENOUS
  Filled 2020-01-26: qty 1

## 2020-01-26 MED ORDER — SUGAMMADEX SODIUM 200 MG/2ML IV SOLN
INTRAVENOUS | Status: DC | PRN
  Administered 2020-01-26: 200 mg via INTRAVENOUS

## 2020-01-26 MED ORDER — LIDOCAINE-EPINEPHRINE 0.5 %-1:200000 IJ SOLN
INTRAMUSCULAR | Status: AC
  Filled 2020-01-26: qty 1

## 2020-01-26 MED ORDER — ONDANSETRON HCL 4 MG/2ML IJ SOLN: 4.0000 mg | Freq: Four times a day (QID) | INTRAMUSCULAR | Status: DC | PRN

## 2020-01-26 MED ORDER — ESMOLOL HCL 100 MG/10ML IV SOLN
INTRAVENOUS | Status: AC
  Filled 2020-01-26: qty 10

## 2020-01-26 MED ORDER — LEVETIRACETAM 500 MG PO TABS
500.0000 mg | ORAL_TABLET | Freq: Two times a day (BID) | ORAL | Status: DC
  Administered 2020-01-28 – 2020-01-29 (×3): 500 mg via ORAL
  Filled 2020-01-26 (×3): qty 1

## 2020-01-26 MED ORDER — ONDANSETRON HCL 4 MG/2ML IJ SOLN
INTRAMUSCULAR | Status: DC | PRN
  Administered 2020-01-26: 4 mg via INTRAVENOUS

## 2020-01-26 MED ORDER — PHENYLEPHRINE HCL-NACL 10-0.9 MG/250ML-% IV SOLN
INTRAVENOUS | Status: DC | PRN
  Administered 2020-01-26: 25 ug/min via INTRAVENOUS

## 2020-01-26 MED ORDER — PHENYLEPHRINE HCL (PRESSORS) 10 MG/ML IV SOLN
INTRAVENOUS | Status: AC
  Filled 2020-01-26: qty 1

## 2020-01-26 MED ORDER — DEXAMETHASONE SODIUM PHOSPHATE 10 MG/ML IJ SOLN
INTRAMUSCULAR | Status: DC | PRN
  Administered 2020-01-26: 5 mg via INTRAVENOUS

## 2020-01-26 MED ORDER — LIDOCAINE-EPINEPHRINE 0.5 %-1:200000 IJ SOLN
INTRAMUSCULAR | Status: DC | PRN
  Administered 2020-01-26: 10 mL

## 2020-01-26 MED ORDER — 0.9 % SODIUM CHLORIDE (POUR BTL) OPTIME
TOPICAL | Status: DC | PRN
  Administered 2020-01-26 (×3): 1000 mL

## 2020-01-26 MED ORDER — BACITRACIN ZINC 500 UNIT/GM EX OINT
TOPICAL_OINTMENT | CUTANEOUS | Status: DC | PRN
  Administered 2020-01-26: 1 via TOPICAL

## 2020-01-26 MED ORDER — FENTANYL CITRATE (PF) 100 MCG/2ML IJ SOLN
25.0000 ug | INTRAMUSCULAR | Status: DC | PRN
  Administered 2020-01-26: 50 ug via INTRAVENOUS

## 2020-01-26 MED ORDER — ROCURONIUM BROMIDE 10 MG/ML (PF) SYRINGE
PREFILLED_SYRINGE | INTRAVENOUS | Status: AC
  Filled 2020-01-26: qty 10

## 2020-01-26 MED ORDER — THROMBIN 5000 UNITS EX SOLR
OROMUCOSAL | Status: DC | PRN
  Administered 2020-01-26: 19:00:00 5 mL via TOPICAL

## 2020-01-26 MED ORDER — SODIUM CHLORIDE 0.9 % IV SOLN: INTRAVENOUS | Status: DC

## 2020-01-26 MED ORDER — LIDOCAINE 2% (20 MG/ML) 5 ML SYRINGE
INTRAMUSCULAR | Status: DC | PRN
  Administered 2020-01-26: 50 mg via INTRAVENOUS

## 2020-01-26 MED ORDER — THROMBIN 20000 UNITS EX SOLR: CUTANEOUS | Status: DC | PRN

## 2020-01-26 MED ORDER — ALBUMIN HUMAN 5 % IV SOLN: INTRAVENOUS | Status: DC | PRN

## 2020-01-26 MED ORDER — LIDOCAINE 2% (20 MG/ML) 5 ML SYRINGE
INTRAMUSCULAR | Status: AC
  Filled 2020-01-26: qty 5

## 2020-01-26 MED ORDER — OXYCODONE HCL 5 MG PO TABS: 5.0000 mg | ORAL_TABLET | Freq: Once | ORAL | Status: DC | PRN

## 2020-01-26 MED ORDER — SODIUM CHLORIDE 0.9 % IV SOLN: INTRAVENOUS | Status: DC | PRN

## 2020-01-26 MED ORDER — PROPOFOL 10 MG/ML IV BOLUS
INTRAVENOUS | Status: AC
  Filled 2020-01-26: qty 20

## 2020-01-26 MED ORDER — LIDOCAINE HCL 1 % IJ SOLN
INTRAMUSCULAR | Status: AC
  Filled 2020-01-26: qty 20

## 2020-01-26 MED ORDER — BACITRACIN ZINC 500 UNIT/GM EX OINT
TOPICAL_OINTMENT | CUTANEOUS | Status: AC
  Filled 2020-01-26: qty 28.35

## 2020-01-26 SURGICAL SUPPLY — 78 items
BENZOIN TINCTURE PRP APPL 2/3 (GAUZE/BANDAGES/DRESSINGS) IMPLANT
BLADE CLIPPER SURG (BLADE) ×3 IMPLANT
BLADE ULTRA TIP 2M (BLADE) ×3 IMPLANT
BNDG GAUZE ELAST 4 BULKY (GAUZE/BANDAGES/DRESSINGS) ×6 IMPLANT
BNDG STRETCH 4X75 NS LF (GAUZE/BANDAGES/DRESSINGS) ×3 IMPLANT
BUR ACORN 6.0 PRECISION (BURR) ×2 IMPLANT
BUR ACORN 6.0MM PRECISION (BURR) ×1
BUR MATCHSTICK NEURO 3.0 LAGG (BURR) IMPLANT
BUR SPIRAL ROUTER 2.3 (BUR) ×2 IMPLANT
BUR SPIRAL ROUTER 2.3MM (BUR) ×1
CANISTER SUCT 3000ML PPV (MISCELLANEOUS) ×3 IMPLANT
CARTRIDGE OIL MAESTRO DRILL (MISCELLANEOUS) ×1 IMPLANT
CLIP RANEY DISP (INSTRUMENTS) ×3 IMPLANT
CLIP VESOCCLUDE MED 6/CT (CLIP) IMPLANT
CNTNR URN SCR LID CUP LEK RST (MISCELLANEOUS) ×1 IMPLANT
CONT SPEC 4OZ STRL OR WHT (MISCELLANEOUS) ×2
COVER WAND RF STERILE (DRAPES) IMPLANT
DIFFUSER DRILL AIR PNEUMATIC (MISCELLANEOUS) ×3 IMPLANT
DRAPE NEUROLOGICAL W/INCISE (DRAPES) ×3 IMPLANT
DRAPE SURG 17X23 STRL (DRAPES) IMPLANT
DRAPE WARM FLUID 44X44 (DRAPES) ×3 IMPLANT
DURAPREP 6ML APPLICATOR 50/CS (WOUND CARE) ×3 IMPLANT
ELECT REM PT RETURN 9FT ADLT (ELECTROSURGICAL) ×3
ELECTRODE REM PT RTRN 9FT ADLT (ELECTROSURGICAL) ×1 IMPLANT
EVACUATOR 1/8 PVC DRAIN (DRAIN) IMPLANT
EVACUATOR SILICONE 100CC (DRAIN) IMPLANT
GAUZE 4X4 16PLY RFD (DISPOSABLE) IMPLANT
GAUZE SPONGE 4X4 12PLY STRL (GAUZE/BANDAGES/DRESSINGS) ×3 IMPLANT
GLOVE BIO SURGEON STRL SZ 6.5 (GLOVE) ×4 IMPLANT
GLOVE BIO SURGEON STRL SZ7 (GLOVE) ×9 IMPLANT
GLOVE BIO SURGEONS STRL SZ 6.5 (GLOVE) ×2
GLOVE BIOGEL PI IND STRL 6.5 (GLOVE) ×1 IMPLANT
GLOVE BIOGEL PI IND STRL 7.5 (GLOVE) ×3 IMPLANT
GLOVE BIOGEL PI INDICATOR 6.5 (GLOVE) ×2
GLOVE BIOGEL PI INDICATOR 7.5 (GLOVE) ×6
GLOVE ECLIPSE 6.5 STRL STRAW (GLOVE) ×6 IMPLANT
GLOVE EXAM NITRILE XL STR (GLOVE) IMPLANT
GLOVE SURG SS PI 7.0 STRL IVOR (GLOVE) ×6 IMPLANT
GOWN STRL REUS W/ TWL LRG LVL3 (GOWN DISPOSABLE) ×3 IMPLANT
GOWN STRL REUS W/ TWL XL LVL3 (GOWN DISPOSABLE) ×1 IMPLANT
GOWN STRL REUS W/TWL 2XL LVL3 (GOWN DISPOSABLE) IMPLANT
GOWN STRL REUS W/TWL LRG LVL3 (GOWN DISPOSABLE) ×6
GOWN STRL REUS W/TWL XL LVL3 (GOWN DISPOSABLE) ×2
GRAFT DURAGEN MATRIX 3WX3L (Graft) ×2 IMPLANT
GRAFT DURAGEN MATRIX 3X3 SNGL (Graft) ×1 IMPLANT
HEMOSTAT POWDER KIT SURGIFOAM (HEMOSTASIS) ×3 IMPLANT
HEMOSTAT SURGICEL 2X14 (HEMOSTASIS) IMPLANT
KIT BASIN OR (CUSTOM PROCEDURE TRAY) ×3 IMPLANT
KIT TURNOVER KIT B (KITS) ×3 IMPLANT
NEEDLE HYPO 25X1 1.5 SAFETY (NEEDLE) ×3 IMPLANT
NS IRRIG 1000ML POUR BTL (IV SOLUTION) ×9 IMPLANT
OIL CARTRIDGE MAESTRO DRILL (MISCELLANEOUS) ×3
PACK CRANIOTOMY CUSTOM (CUSTOM PROCEDURE TRAY) ×3 IMPLANT
PATTIES SURGICAL .5 X.5 (GAUZE/BANDAGES/DRESSINGS) IMPLANT
PATTIES SURGICAL .5 X3 (DISPOSABLE) IMPLANT
PATTIES SURGICAL 1X1 (DISPOSABLE) IMPLANT
PLATE 1.5 5HOLE SQUARE (Plate) ×3 IMPLANT
PLATE 1.5/0.5 13MM BURR HOLE (Plate) ×6 IMPLANT
PLATE 1.5/0.5 18.5MM BURR HOLE (Plate) ×6 IMPLANT
SCREW SELF DRILL HT 1.5/4MM (Screw) ×57 IMPLANT
SPONGE NEURO XRAY DETECT 1X3 (DISPOSABLE) IMPLANT
SPONGE SURGIFOAM ABS GEL 100 (HEMOSTASIS) ×3 IMPLANT
STAPLER VISISTAT 35W (STAPLE) ×3 IMPLANT
SUT ETHILON 3 0 FSL (SUTURE) IMPLANT
SUT ETHILON 3 0 PS 1 (SUTURE) IMPLANT
SUT NURALON 4 0 TR CR/8 (SUTURE) ×6 IMPLANT
SUT STEEL 0 (SUTURE)
SUT STEEL 0 18XMFL TIE 17 (SUTURE) IMPLANT
SUT VIC AB 2-0 CT2 18 VCP726D (SUTURE) ×9 IMPLANT
TAPE SURG TRANSPORE 1 IN (GAUZE/BANDAGES/DRESSINGS) ×1 IMPLANT
TAPE SURGICAL TRANSPORE 1 IN (GAUZE/BANDAGES/DRESSINGS) ×2
TOWEL GREEN STERILE (TOWEL DISPOSABLE) ×3 IMPLANT
TOWEL GREEN STERILE FF (TOWEL DISPOSABLE) ×3 IMPLANT
TRAY FOLEY MTR SLVR 16FR STAT (SET/KITS/TRAYS/PACK) IMPLANT
TUBE CONNECTING 12'X1/4 (SUCTIONS)
TUBE CONNECTING 12X1/4 (SUCTIONS) IMPLANT
UNDERPAD 30X30 (UNDERPADS AND DIAPERS) IMPLANT
WATER STERILE IRR 1000ML POUR (IV SOLUTION) ×3 IMPLANT

## 2020-01-26 NOTE — Progress Notes (Signed)
NAME:  Quamere Lemas, MRN:  [id number], DOB:  02-05-1961, LOS: 3 ADMISSION DATE:  01/22/2020, CHIEF COMPLAINT:  Aphasia and weakness   Brief History   This is a 59 year old nursing home resident with 59 year old man with acute on chronic subdural hematoma.  On lovenox for RLE DVT. He was Sent to [facility] from SNF where he was felt to have worsening left sided weakness and aphasia starting at 1800 on 2/18 but had been noted to not be himself earlier that day.  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  He came to medical attention at his nursing home because of aphasia and weakness.  A CT scan of the head has shown acute on chronic subdural hematoma.  There is also some encephalomalacia on the left underlying the hematoma consequently there is no midline shift.   Also having worsening hematuria. Neurosurgery has evaluated the patient and is following. His lovenox was reversed with protamine on admission. PCCM contacted for admission.  Past Medical History  Otherwise remarkable for coronary disease diabetes history of DVT retroperitoneal fibrosis, prior CVA and IVC filter placement  Significant Hospital Events   2/18 >>admission  Consults:  Neurosurgery Nephrology  Procedures:    Significant Diagnostic Tests:  2/21 CT Head > Some further enlargement of the subdural hematoma on the left. Overall volume increase estimated at about 1/3. Maximal thickness now measured at 2.5 cm compared with 2 cm previously. More hyperdense component. Summary distribution, including along the posterior falx and the tentorium. Increased mass effect with left-to-right shift of 7 mm today, increased from 4 mm previously.  2/20 KUB - no IVC filter in place  Micro Data:  COVID 2/19 > Negative  Urine cultures 2/19 > polymicrobial  Blood cultures 2/19 > negative MRSA PCR 2/19 > Positive   Antimicrobials:  Cefepime 2/19 >>  Interim  history/subjective:  No events overnight, mentation continues to fluctuate. Positive 1.5L since admission, good urine output   Objective   Blood pressure 119/70, pulse 70, temperature 98.6 F (37 C), resp. rate 16, height 6\' 1"  (1.854 m), weight 110.3 kg, SpO2 100 %.        Intake/Output Summary (Last 24 hours) at 01/26/2020 [id number] Last data filed at 01/26/2020 0600 Gross per 24 hour  Intake 444.48 ml  Output 1200 ml  Net -755.52 ml   Filed Weights   01/22/20 2158 01/23/20 1500  Weight: 112 kg 110.3 kg    Examination: General: Chronically ill appearing elderly male lying in bed in NAD HEENT: [place]/AT, MM pink/moist, PERRL,  Neuro: Alert and able to intermittently follow simple commands, no movement to right upper or lower extremity, slight tremor felt in upper extremity arm appears chronically contracted   CV: s1s2 regular rate and rhythm, no murmur, rubs, or gallops,  PULM:  Clear to ascultation bilaterally, no increased work of breathing GI: soft, bowel sounds active in all 4 quadrants, non-tender, non-distended [id number] tinged urine seen in nephrostomy bag Extremities: warm/dry, 3+ pitting RLE edema  Skin: no rashes or lesions  Resolved Hospital Problem list    Fever - on admission - received 48 hours empiric cefepime. No evidence of pneumonia, UTI or positive blood cultures.  Assessment & Plan:  Shephen Lashlee is a 59 yo gentleman with a past medical history of CKD, HTN and obstructive uropathy with left nephrostomy tubes who lives in a SNF and presented on 2/19 with:  Acute on chronic subdural  hematoma P: Management per neurology/neurosurgery  Maintain neuro protective measures; goal for eurothermia, euglycemia, eunatermia, normoxia, and PCO2 goal of 35-40 Nutrition and bowel regiment  Seizure precautions  AEDs per neurology  Per neurosurgery Dr. Annette Stable and Dr. Christella Noa agree that patient will need craniotomy with evacuation of subdural hematoma, scheduled for [id number]   Acute  metabolic encephalopathy  -Secondary to acute on chronic subdural hematoma resulting in worsening lethargy and aphasia consistent with location of hematoma.  - Unable to locate image of head CT 2/18 however per radiology worsening midline shift seen on head CT 2/21 P: Delirium precautions  Minimize sedation   Frequent neuro checks   DVT right leg s/p treatment with enoxaparin due to Xarelto failure. -Vascular surgery attempted to place IVCF 2/9 with inability to place a inferior vena cava filter as the infrarenal IVC was occluded P: Continue to hold anticoagulation  Patient will need IVCF placed once stable Consult IR for placement of IVCF  Will coordinate with neurosurgery for the resumption of anticoagulation   Stage IV renal disease Hematuria -secondary to obstructive uropathy with bilateral nephrostomy tubes P: Nephrology following  Possible plan to exchange nephrostomy tube during this admission, originally planned 3/3 Follow renal function / urine output Trend Bmet Avoid nephrotoxins, ensure adequate renal perfusion  Flush nephrostomy tubes  Multifactorial anemia due to acute/chronic blood loss and chronic renal disease.  P: Transfuse per protocol  Trend CBC   Best practice:  Diet:NPO  Pain/Anxiety/Delirium protocol (if indicated): Not indicated VAP protocol (if indicated): Not currently indicated DVT prophylaxis: will placed SCD on the left side, right leg has DVT.  GI prophylaxis: Pepcid ordered Glucose control: Sliding scale insulin ordered Mobility: Bedrest Code Status: Full Disposition: needs ICU.   Labs   CBC: Recent Labs  Lab 01/22/20 2213 01/22/20 2213 01/23/20 0235 01/23/20 1400 01/23/20 1900 01/24/20 0517 01/25/20 0504  WBC 9.7  --   --  10.4 11.5* 10.8* 9.8  NEUTROABS 7.9*  --   --   --  9.7*  --  7.9*  HGB 6.4*  --   --  8.0* 7.9* 8.1* 8.2*  HCT 22.2*  --   --  25.7* 26.5* 27.1* 28.1*  MCV 94.5  --   --  89.9 91.4 92.2 93.7  PLT 376   < >  386 310 319 309 325   < > = values in this interval not displayed.    Basic Metabolic Panel: Recent Labs  Lab 01/22/20 2213 01/23/20 1400 01/24/20 0517 01/25/20 0504  NA 134* 138 141 146*  K 4.9 4.6 4.6 4.5  CL 99 106 108 115*  CO2 22 20* 19* 17*  GLUCOSE 171* 156* 138* 140*  BUN 64* 55* 47* 40*  CREATININE 3.38* 3.11* 2.95* 2.54*  CALCIUM 8.7* 8.6* 8.6* 8.8*   GFR: Estimated Creatinine Clearance: 40.3 mL/min (A) (by C-G formula based on SCr of 2.54 mg/dL (H)). Recent Labs  Lab 01/23/20 1400 01/23/20 1645 01/23/20 1900 01/24/20 0517 01/24/20 0842 01/24/20 1031 01/25/20 0504  WBC 10.4  --  11.5* 10.8*  --   --  9.8  LATICACIDVEN 1.0 2.6*  --   --  3.1* 1.9  --     Liver Function Tests: Recent Labs  Lab 01/22/20 2213 01/24/20 0517  AST 57* 48*  ALT 88* 76*  ALKPHOS 70 55  BILITOT 0.7 1.3*  PROT 7.6 7.2  ALBUMIN 3.4* 2.9*   No results for input(s): LIPASE, AMYLASE in the last 168 hours. No results for  input(s): AMMONIA in the last 168 hours.  ABG    Component Value Date/Time   TCO2 25 01/13/2020 1007     Coagulation Profile: Recent Labs  Lab 01/22/20 2213 01/23/20 0235  INR 1.2 1.2    Cardiac Enzymes: No results for input(s): CKTOTAL, CKMB, CKMBINDEX, TROPONINI in the last 168 hours.  HbA1C: Hgb A1c MFr Bld  Date/Time Value Ref Range Status  01/24/2020 05:17 AM 5.0 4.8 - 5.6 % Final    Comment:    (NOTE) Pre diabetes:          5.7%-6.4% Diabetes:              >6.4% Glycemic control for   <7.0% adults with diabetes   01/23/2020 04:32 PM 5.2 4.8 - 5.6 % Final    Comment:    (NOTE) Pre diabetes:          5.7%-6.4% Diabetes:              >6.4% Glycemic control for   <7.0% adults with diabetes     CBG: Recent Labs  Lab 01/25/20 1522 01/25/20 1939 01/25/20 2319 01/26/20 0313 01/26/20 0712  GLUCAP 109* 133* 121* 128* 119*   CRITICAL CARE Performed by: Johnsie Cancel   Total critical care time: 35 minutes  Critical care  time was exclusive of separately billable procedures and treating other patients.  Critical care was necessary to treat or prevent imminent or life-threatening deterioration.  Critical care was time spent personally by me on the following activities: development of treatment plan with patient and/or surrogate as well as nursing, discussions with consultants, evaluation of patient's response to treatment, examination of patient, obtaining history from patient or surrogate, ordering and performing treatments and interventions, ordering and review of laboratory studies, ordering and review of radiographic studies, pulse oximetry and re-evaluation of patient's condition.  Johnsie Cancel, NP-C [facility] Contact / Pager information can be found on Amion  01/26/2020, 8:58 AM

## 2020-01-26 NOTE — Progress Notes (Signed)
PT Cancellation Note  Patient Details Name: Brendan [name] MRN: [id number] DOB: November 17, 1961   Cancelled Treatment:    Reason Eval/Treat Not Completed: Patient at procedure or test/unavailable. Pt off the floor at getting his nephrostomy tube replaced and then will be heading to surgery later in afternoon. Acute PT to return as able, as appropriate to re-assess mobility s/p surgery.  Kittie Plater, PT, DPT Acute Rehabilitation Services Pager #: [phone] Office #: [phone]    Berline Lopes 01/26/2020, 10:52 AM

## 2020-01-26 NOTE — Procedures (Signed)
Interventional Radiology Procedure Note  Procedure: Image guided exchange of left PCN.  New 72F drain.  .  Complications: None  Recommendations:  - To gravity.   Signed,  Dulcy Fanny. Earleen Newport, DO

## 2020-01-26 NOTE — Progress Notes (Signed)
SLP Cancellation Note  Patient Details Name: Waymond Stoltenberg MRN: [id number] DOB: 1961-02-18   Cancelled treatment:       Reason Eval/Treat Not Completed: Medical issues which prohibited therapy - currently NPO pending OR this afternoon. Will f/u for dysphagia tx as able.    Osie Bond., M.A. [place] Acute Rehabilitation Services Pager [phone] Office [phone]  01/26/2020, 3:11 PM

## 2020-01-26 NOTE — Anesthesia Preprocedure Evaluation (Signed)
Anesthesia Evaluation  Patient identified by MRN, date of birth, ID band Patient awake    Reviewed: Allergy & Precautions, H&P , NPO status , Patient's Chart, lab work & pertinent test results  Airway Mallampati: II   Neck ROM: full    Dental   Pulmonary neg pulmonary ROS,    breath sounds clear to auscultation       Cardiovascular hypertension, + CAD and + Peripheral Vascular Disease   Rhythm:regular Rate:Normal     Neuro/Psych Right side hemiplegia CVA    GI/Hepatic GERD  ,  Endo/Other  diabetes  Renal/GU Renal InsufficiencyRenal disease     Musculoskeletal   Abdominal   Peds  Hematology  (+) Blood dyscrasia, anemia ,   Anesthesia Other Findings   Reproductive/Obstetrics                             Anesthesia Physical Anesthesia Plan  ASA: III  Anesthesia Plan: General   Post-op Pain Management:    Induction: Intravenous  PONV Risk Score and Plan: 2 and Ondansetron, Dexamethasone and Treatment may vary due to age or medical condition  Airway Management Planned: Oral ETT  Additional Equipment:   Intra-op Plan:   Post-operative Plan: Extubation in OR  Informed Consent: I have reviewed the patients History and Physical, chart, labs and discussed the procedure including the risks, benefits and alternatives for the proposed anesthesia with the patient or authorized representative who has indicated his/her understanding and acceptance.       Plan Discussed with: CRNA, Anesthesiologist and Surgeon  Anesthesia Plan Comments:         Anesthesia Quick Evaluation

## 2020-01-26 NOTE — Progress Notes (Signed)
[place] Progress Note [name] DOB: Dec 28, 1960 MRN: [id number]   Date of Service  01/26/2020  HPI/Events of Note  Cell Cept suspension needs order to be placed per tube.  eICU Interventions  Order placed for Cellcept suspension to be given per tube.      Intervention Category Minor Interventions: Other:  Magdalen Spatz 01/26/2020, 11:00 PM

## 2020-01-26 NOTE — Anesthesia Procedure Notes (Signed)
Procedure Name: Intubation Date/Time: 01/26/2020 5:40 PM Performed by: Shirlyn Goltz, CRNA Pre-anesthesia Checklist: Patient identified, Emergency Drugs available, Suction available and Patient being monitored Patient Re-evaluated:Patient Re-evaluated prior to induction Oxygen Delivery Method: Circle system utilized Preoxygenation: Pre-oxygenation with 100% oxygen Induction Type: IV induction Ventilation: Mask ventilation without difficulty Laryngoscope Size: Mac and 4 Grade View: Grade I Tube type: Oral Tube size: 7.5 mm Number of attempts: 1 Airway Equipment and Method: Stylet Placement Confirmation: ETT inserted through vocal cords under direct vision,  positive ETCO2 and breath sounds checked- equal and bilateral Secured at: 23 cm Tube secured with: Tape Dental Injury: Teeth and Oropharynx as per pre-operative assessment

## 2020-01-26 NOTE — Progress Notes (Signed)
BP 123/76   Pulse 70   Temp 98.6 F (37 C)   Resp 16   Ht 6\' 1"  (1.854 m)   Wt 110.3 kg   SpO2 100%   BMI 32.08 kg/m  Mr. Sterchi is alert, oriented to person, following commands. Will proceed with craniotomy for left subdural hematoma. I called his sister's number and left message on a machine. No answer.

## 2020-01-26 NOTE — Op Note (Signed)
01/26/2020  8:54 PM  PATIENT:  Brendan [name]  59 y.o. male presented with an acute on chronic subdural hematoma on the left side which has grown slightly since his admission. Given that his coagulation is at this time normal I recommended that he have operative subdural hematoma evacuation.   PRE-OPERATIVE DIAGNOSIS:  Left subdural hematoma  POST-OPERATIVE DIAGNOSIS:  Left subdural hematoma  PROCEDURE:  Procedure(s): left frontal temporal parietal CRANIOTOMY for subdural HEMATOMA EVACUATION   SURGEON: Surgeon(s): Ashok Pall, MD  ASSISTANTS:none  ANESTHESIA:   local and general  EBL:  Total I/O In: 1150 [I.V.:900; IV Piggyback:250] Out: -   BLOOD ADMINISTERED:none  CELL SAVER GIVEN:none  COUNT:per nursing  DRAINS: none   SPECIMEN:  Source of Specimen:  subdural space  DICTATION: Brendan [name] was taken to the operating room, intubated, and placed under a general anesthetic without difficulty. He was positioned supine on the bed with his head turned towards the right on a horseshoe head rest. His head was shaved,  was prepped and was draped in a sterile manner. I infiltrated lidocaine in the planned reverse question mark incision. I opened the scalp with a 10 blade, and placed Raney clips on the scalp edges. I reflected the scalp rostrally with fish hooks. I divided the temporalis muscle and also reflected this rostrally with the scalp. I drilled burr holes around the periphery of the skull exposure and connected them with the craniotome. I elevated the skull flap exposing the dura. I opened the dura in a semicircle basing the dura rostrally. I encountered mainly clotted blood overlying the cerebral surface. I used a variety of tools and irrigation to remove the blood. I also retracted the brain gently to remove blood caudally. The brain was pulsatile when I was finished with the evacuation.  I then approximated the dura, and used duragen to cover some openings in the dura. I placed tack  up sutures in the dura around the outside of the dural opening. I, using burr hole covers and a box plate approximated the skull flap with screws. I approximated the temporalis with sutures. I approximated the galea with suture, and the scalp edges with staples. I applied a sterile dressing. Brendan [name] was awakened and extubated in the operating room.  PLAN OF CARE: Admit to inpatient   PATIENT DISPOSITION:  PACU - hemodynamically stable.   Delay start of Pharmacological VTE agent (>24hrs) due to surgical blood loss or risk of bleeding:  yes

## 2020-01-26 NOTE — Transfer of Care (Signed)
Immediate Anesthesia Transfer of Care Note  Patient: Brendan [name]  Procedure(s) Performed: CRANIOTOMY HEMATOMA EVACUATION SUBDURAL (Left Head)  Patient Location: PACU  Anesthesia Type:General  Level of Consciousness: drowsy  Airway & Oxygen Therapy: Patient Spontanous Breathing and Patient connected to nasal cannula oxygen  Post-op Assessment: Report given to RN and Post -op Vital signs reviewed and stable  Post vital signs: Reviewed and stable  Last Vitals:  Vitals Value Taken Time  BP 110/84 01/26/20 2033  Temp    Pulse 106 01/26/20 2033  Resp 22 01/26/20 2033  SpO2 100 % 01/26/20 2033  Vitals shown include unvalidated device data.  Last Pain:  Vitals:   01/26/20 1300  TempSrc:   PainSc: 0-No pain         Complications: No apparent anesthesia complications

## 2020-01-26 NOTE — Progress Notes (Signed)
Spoke to [facility] RN who said she would speak with [facility] MD. Patient has no current diet order. Was NPO for surgery. Patient has mycophenolate ordered for 2200, via oral route. Patient has a cortrak. This RN requested MD to put in a diet order or change medication route to per tube. Will continue to monitor.

## 2020-01-26 NOTE — Anesthesia Postprocedure Evaluation (Signed)
Anesthesia Post Note  Patient: Brendan [name]  Procedure(s) Performed: CRANIOTOMY HEMATOMA EVACUATION SUBDURAL (Left Head)     Patient location during evaluation: PACU Anesthesia Type: General Level of consciousness: sedated Pain management: pain level controlled Vital Signs Assessment: post-procedure vital signs reviewed and stable Respiratory status: spontaneous breathing and respiratory function stable Cardiovascular status: stable Postop Assessment: no apparent nausea or vomiting Anesthetic complications: no    Last Vitals:  Vitals:   01/26/20 2102 01/26/20 2103  BP: (!) 116/101 119/70  Pulse: 97 94  Resp: 11 12  Temp:    SpO2: 100% 99%    Last Pain:  Vitals:   01/26/20 1300  TempSrc:   PainSc: 0-No pain                 Gerrie Castiglia DANIEL

## 2020-01-27 ENCOUNTER — Encounter: Payer: Self-pay | Admitting: *Deleted

## 2020-01-27 ENCOUNTER — Ambulatory Visit: Payer: Medicaid Other | Admitting: Urology

## 2020-01-27 LAB — TYPE AND SCREEN
ABO/RH(D): A POS
Antibody Screen: NEGATIVE
Unit division: 0
Unit division: 0
Unit division: 0
Unit division: 0

## 2020-01-27 LAB — BPAM RBC
Blood Product Expiration Date: 202102242359
Blood Product Expiration Date: 202102242359
Blood Product Expiration Date: 202103012359
Blood Product Expiration Date: 202103012359
ISSUE DATE / TIME: 202102150742
ISSUE DATE / TIME: 202102150742
ISSUE DATE / TIME: 202102190209
ISSUE DATE / TIME: 202102190536
Unit Type and Rh: 6200
Unit Type and Rh: 6200
Unit Type and Rh: 6200
Unit Type and Rh: 6200

## 2020-01-27 LAB — CBC
HCT: 25.4 % — ABNORMAL LOW (ref 39.0–52.0)
Hemoglobin: 7.3 g/dL — ABNORMAL LOW (ref 13.0–17.0)
MCH: 27.7 pg (ref 26.0–34.0)
MCHC: 28.7 g/dL — ABNORMAL LOW (ref 30.0–36.0)
MCV: 96.2 fL (ref 80.0–100.0)
Platelets: 370 10*3/uL (ref 150–400)
RBC: 2.64 MIL/uL — ABNORMAL LOW (ref 4.22–5.81)
RDW: 19 % — ABNORMAL HIGH (ref 11.5–15.5)
WBC: 13 10*3/uL — ABNORMAL HIGH (ref 4.0–10.5)
nRBC: 0.8 % — ABNORMAL HIGH (ref 0.0–0.2)

## 2020-01-27 LAB — BASIC METABOLIC PANEL
Anion gap: 13 (ref 5–15)
BUN: 39 mg/dL — ABNORMAL HIGH (ref 6–20)
CO2: 15 mmol/L — ABNORMAL LOW (ref 22–32)
Calcium: 8.6 mg/dL — ABNORMAL LOW (ref 8.9–10.3)
Chloride: 120 mmol/L — ABNORMAL HIGH (ref 98–111)
Creatinine, Ser: 2.67 mg/dL — ABNORMAL HIGH (ref 0.61–1.24)
GFR calc Af Amer: 29 mL/min — ABNORMAL LOW (ref >60)
GFR calc non Af Amer: 25 mL/min — ABNORMAL LOW (ref >60)
Glucose, Bld: 143 mg/dL — ABNORMAL HIGH (ref 70–99)
Potassium: 5.3 mmol/L — ABNORMAL HIGH (ref 3.5–5.1)
Sodium: 148 mmol/L — ABNORMAL HIGH (ref 135–145)

## 2020-01-27 LAB — GLUCOSE, CAPILLARY
Glucose-Capillary: 134 mg/dL — ABNORMAL HIGH (ref 70–99)
Glucose-Capillary: 145 mg/dL — ABNORMAL HIGH (ref 70–99)
Glucose-Capillary: 153 mg/dL — ABNORMAL HIGH (ref 70–99)
Glucose-Capillary: 154 mg/dL — ABNORMAL HIGH (ref 70–99)
Glucose-Capillary: 155 mg/dL — ABNORMAL HIGH (ref 70–99)
Glucose-Capillary: 165 mg/dL — ABNORMAL HIGH (ref 70–99)
Glucose-Capillary: 189 mg/dL — ABNORMAL HIGH (ref 70–99)

## 2020-01-27 MED ORDER — OSMOLITE 1.5 CAL PO LIQD
1000.0000 mL | ORAL | Status: DC
  Administered 2020-01-27 – 2020-02-02 (×7): 1000 mL
  Filled 2020-01-27 (×10): qty 1000

## 2020-01-27 MED ORDER — PHENYLEPHRINE HCL-NACL 10-0.9 MG/250ML-% IV SOLN
INTRAVENOUS | Status: AC
  Filled 2020-01-27: qty 250

## 2020-01-27 MED ORDER — PRO-STAT SUGAR FREE PO LIQD
30.0000 mL | Freq: Two times a day (BID) | ORAL | Status: DC
  Administered 2020-01-27 – 2020-02-02 (×13): 30 mL
  Filled 2020-01-27 (×14): qty 30

## 2020-01-27 MED FILL — Thrombin For Soln 5000 Unit: CUTANEOUS | Qty: 5000 | Status: AC

## 2020-01-27 NOTE — Progress Notes (Signed)
RT Note:  Arterial Line not placed at this time. See previous progress note from nursing. Need for line will be revaluated in the a.m.

## 2020-01-27 NOTE — Progress Notes (Addendum)
NAME:  Brendan [name], MRN:  [id number], DOB:  1961/03/29, LOS: 4 ADMISSION DATE:  01/22/2020, CHIEF COMPLAINT:  Aphasia and weakness   Brief History   This is a 59 year old nursing home resident with 59 year old man with acute on chronic subdural hematoma.  On lovenox for RLE DVT. He was Sent to [facility] from SNF where he was felt to have worsening left sided weakness and aphasia starting at 1800 on 2/18 but had been noted to not be himself earlier that day.  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  He came to medical attention at his nursing home because of aphasia and weakness.  A CT scan of the head has shown acute on chronic subdural hematoma.  There is also some encephalomalacia on the left underlying the hematoma consequently there is no midline shift.   Also having worsening hematuria. Neurosurgery has evaluated the patient and is following. His lovenox was reversed with protamine on admission. PCCM contacted for admission.  Past Medical History  Otherwise remarkable for coronary disease diabetes history of DVT retroperitoneal fibrosis, prior CVA and IVC filter placement  Significant Hospital Events   2/18 >>admission  Consults:  Neurosurgery Nephrology  Procedures:  Left craniotomy for subdural hematoma evacuation 2/22 (Dr. Christella Noa) Percutaneous nephrostomy tube exchange 2/22 (Dr. Earleen Newport, IR)  Significant Diagnostic Tests:  2/21 CT Head > Some further enlargement of the subdural hematoma on the left. Overall volume increase estimated at about 1/3. Maximal thickness now measured at 2.5 cm compared with 2 cm previously. More hyperdense component. Summary distribution, including along the posterior falx and the tentorium. Increased mass effect with left-to-right shift of 7 mm today, increased from 4 mm previously.  2/20 KUB - no IVC filter in place  Micro Data:  COVID 2/19 > Negative  Urine cultures 2/19 >  polymicrobial  Blood cultures 2/19 > negative MRSA PCR 2/19 > Positive   Antimicrobials:  Cefepime 2/19 >> 2/21  Interim history/subjective:  Underwent craniotomy and percutaneous nephrostomy tube exchange as above Tube feeding not yet restarted  Objective   Blood pressure (!) 140/110, pulse (!) 114, temperature 98.5 F (36.9 C), temperature source Axillary, resp. rate 12, height 6\' 1"  (1.854 m), weight 110.3 kg, SpO2 100 %.        Intake/Output Summary (Last 24 hours) at 01/27/2020 0801 Last data filed at 01/27/2020 0700 Gross per 24 hour  Intake 1659.14 ml  Output 1205 ml  Net 454.14 ml   Filed Weights   01/22/20 2158 01/23/20 1500  Weight: 112 kg 110.3 kg    Examination: General: Ill-appearing man, laying comfortably in bed HEENT: Craniotomy staples on the left, incision clean, left periorbital edema and eye swelling.  Pupils equal. Neuro: Awake, eyes open, attempting to phonate but unable to understand.  Intermittently follows commands.  Some residual right upper extremity weakness (chronic) CV: Regular, distant, no murmur PULM: Clear bilaterally, no crackles, no wheezes GI: Nondistended, no apparent tenderness, positive bowel sounds GU: Pink/yellow urine in nephrostomy bag Extremities: 3+ right lower extremity edema Skin: No rash  Resolved Hospital Problem list    Fever - on admission - received 48 hours empiric cefepime. No evidence of pneumonia, UTI or positive blood cultures.  Assessment & Plan:  Brendan [name] is a 59 yo gentleman with a past medical history of CKD, HTN and obstructive uropathy with left nephrostomy tubes who lives in a SNF and presented on 2/19 with:  Acute on chronic subdural hematoma P: Post craniotomy and clot evacuation on 2/22.  Appreciate neurosurgery management.  Repeat imaging as per their plans Maintain neuro protective measures; goal for eurothermia, euglycemia, eunatermia, normoxia, and PCO2 goal of 35-40 Associated encephalopathy and  dysphagia, SLP evaluation but not ready for p.o. feeds yet.  Restart tube feeding 2/23 Keppra as ordered  Acute metabolic encephalopathy  -Secondary to acute on chronic subdural hematoma resulting in worsening lethargy and aphasia consistent with location of hematoma.  - Unable to locate image of head CT 2/18 however per radiology worsening midline shift seen on head CT 2/21 P: Minimize sedating medications as able Initiate PT/OT  DVT right leg s/p treatment with enoxaparin due to Xarelto failure. -Vascular surgery attempted to place IVCF 2/9 with inability to place a inferior vena cava filter as the infrarenal IVC was occluded P: Hold anticoagulation until cleared to restart by neurosurgery Unclear whether IVC filter will be possible, was attempted 2/9 but unable due to clot burden.  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  Flush as per protocol Follow urine output, BMP, I/O Continue mycophenolate  Hypertension Labetalol and hydralazine ordered as needed  Multifactorial anemia due to acute/chronic blood loss and chronic renal disease.  P: Transfuse per protocol  Trend CBC   Best practice:  Diet:NPO  Pain/Anxiety/Delirium protocol (if indicated): Not indicated VAP protocol (if indicated): Not currently indicated DVT prophylaxis: will placed SCD on the left side, right leg has DVT.  GI prophylaxis: Pepcid ordered Glucose control: Sliding scale insulin ordered Mobility: Bedrest Code Status: Full Family: spoke w Mom 2/23 who wants pt's sister Elmo Putt to be main  point of contact. Reviewed status with her on 2/23 Disposition: ICU.   Labs   CBC: Recent Labs  Lab 01/22/20 2213 01/23/20 0235 01/23/20 1900 01/24/20 0517 01/25/20 0504 01/26/20 0941 01/27/20 0543  WBC 9.7   < > 11.5* 10.8* 9.8 7.2 13.0*  NEUTROABS 7.9*  --  9.7*  --  7.9*  --   --   HGB 6.4*   < > 7.9* 8.1* 8.2* 8.2* 7.3*  HCT 22.2*   < > 26.5* 27.1* 28.1* 28.4* 25.4*  MCV 94.5   < > 91.4 92.2 93.7 95.0 96.2  PLT 376   < > 319 309 325 264 370   < > = values in this interval not displayed.    Basic Metabolic Panel: Recent Labs  Lab 01/23/20 1400 01/24/20 0517 01/25/20 0504 01/26/20 0941 01/27/20 0543  NA 138 141 146* 147* 148*  K 4.6 4.6 4.5 4.4 5.3*  CL 106 108 115* 116* 120*  CO2 20* 19* 17* 19* 15*  GLUCOSE 156* 138* 140* 140* 143*  BUN 55* 47* 40* 37* 39*  CREATININE 3.11* 2.95* 2.54* 2.45* 2.67*  CALCIUM 8.6* 8.6* 8.8* 8.6* 8.6*   GFR: Estimated Creatinine Clearance: 38.3 mL/min (A) (by C-G formula based on SCr of 2.67 mg/dL (H)). Recent Labs  Lab 01/23/20 1400 01/23/20 1645 01/23/20 1900 01/24/20 0517 01/24/20 0842 01/24/20 1031 01/25/20 0504 01/26/20 0941 01/27/20 0543  WBC 10.4  --    < > 10.8*  --   --  9.8 7.2  13.0*  LATICACIDVEN 1.0 2.6*  --   --  3.1* 1.9  --   --   --    < > = values in this interval not displayed.    Liver Function Tests: Recent Labs  Lab 01/22/20 2213 01/24/20 0517  AST 57* 48*  ALT 88* 76*  ALKPHOS 70 55  BILITOT 0.7 1.3*  PROT 7.6 7.2  ALBUMIN 3.4* 2.9*   No results for input(s): LIPASE, AMYLASE in the last 168 hours. No results for input(s): AMMONIA in the last 168 hours.  ABG    Component Value Date/Time   TCO2 25 01/13/2020 1007     Coagulation Profile: Recent Labs  Lab 01/22/20 2213 01/23/20 0235  INR 1.2 1.2    Cardiac Enzymes: No results for input(s): CKTOTAL, CKMB, CKMBINDEX, TROPONINI in the last 168 hours.  HbA1C: Hgb A1c MFr Bld  Date/Time Value Ref Range Status  01/24/2020 05:17  AM 5.0 4.8 - 5.6 % Final    Comment:    (NOTE) Pre diabetes:          5.7%-6.4% Diabetes:              >6.4% Glycemic control for   <7.0% adults with diabetes   01/23/2020 04:32 PM 5.2 4.8 - 5.6 % Final    Comment:    (NOTE) Pre diabetes:          5.7%-6.4% Diabetes:              >6.4% Glycemic control for   <7.0% adults with diabetes     CBG: Recent Labs  Lab 01/26/20 2034 01/26/20 2215 01/26/20 2358 01/27/20 0313 01/27/20 0741  GLUCAP 145* 159* 145* 134* 153*   CRITICAL CARE Performed by: Collene Gobble   Total critical care time: 31 minutes  Critical care time was exclusive of separately billable procedures and treating other patients.  Critical care was necessary to treat or prevent imminent or life-threatening deterioration.  Critical care was time spent personally by me on the following activities: development of treatment plan with patient and/or surrogate as well as nursing, discussions with consultants, evaluation of patient's response to treatment, examination of patient, obtaining history from patient or surrogate, ordering and performing treatments and interventions, ordering and review of laboratory studies, ordering and review of radiographic studies, pulse oximetry and re-evaluation of patient's condition.   Baltazar Apo, MD, PhD 01/27/2020, 8:19 AM [facility] [phone] or if no answer [phone]

## 2020-01-27 NOTE — Progress Notes (Signed)
Physical Therapy Re-evaluation Patient Details Name: Brendan [name] MRN: [id number] DOB: 1961-01-27 Today's Date: 01/27/2020    History of Present Illness 59 year old nursing home resident with DM, L CVA 2009, and CAD who is chronically anticoagulated with Lovenox due to the presence of a right lower extremity DVT despite the fact that he has a IVC filter in place.  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  He came to medical attention at his [place] home because of aphasia and weakness.  A CT scan of the head has shown acute on chronic subdural hematoma., pt s/p evacuation of L SDH 01/26/20    PT Comments    Pt underwent evacuation of L SDH yesterday. Pt more alert and attempting to verbalize/communicate with PT. Pt impulsive but eager to participate in PT and was able to report PLOF however unsure of accuracy. Pt with improved transfer to EOB and was able to complete sit to stand however HR increased from 110s to 170s bpm with mobility requiring pt to be returned to supine. RN notified. Acute PT to cont to follow and progress mobility as pt reports he amb with walker PTA not w/c primarily.   Follow Up Recommendations  SNF;Supervision/Assistance - 24 hour     Equipment Recommendations  None recommended by PT    Recommendations for Other Services       Precautions / Restrictions Precautions Precautions: Fall Restrictions Weight Bearing Restrictions: No    Mobility  Bed Mobility Overal bed mobility: Needs Assistance Bed Mobility: Supine to Sit;Sit to Supine     Supine to sit: Mod assist;+2 for physical assistance Sit to supine: Mod assist;+2 for physical assistance   General bed mobility comments: pt initiated transfer to EOB using L UE and LE, initiated R LE but unable to use R UE functionally, modA for trunk elevation, modA for LE management back into bed  Transfers Overall transfer level: Needs  assistance Equipment used: 2 person hand held assist Transfers: Sit to/from Stand Sit to Stand: Mod assist;Max assist;+2 physical assistance         General transfer comment: used bed pad and gait belt for sit to stand, pt powered up well, R knee blocked, attempted to side step however pts HR increased into 170s, pt returned to supine in bed  Ambulation/Gait             General Gait Details: deferred   Stairs             Wheelchair Mobility    Modified Rankin (Stroke Patients Only) Modified Rankin (Stroke Patients Only) Pre-Morbid Rankin Score: Moderately severe disability Modified Rankin: Severe disability     Balance Overall balance assessment: Needs assistance Sitting-balance support: Single extremity supported;Feet supported Sitting balance-Leahy Scale: Fair Sitting balance - Comments: pt able to maintain balance without physical assist                                    Cognition Arousal/Alertness: Awake/alert Behavior During Therapy: Flat affect;Impulsive Overall Cognitive Status: Impaired/Different from baseline Area of Impairment: Following commands;Safety/judgement;Problem solving                       Following Commands: Follows one step commands with increased time Safety/Judgement: Decreased awareness of safety;Decreased awareness of deficits Awareness: Intellectual Problem Solving: Slow processing;Requires verbal cues;Requires tactile cues General Comments:  pt impulsive trying to bring self to EOB and into standing without waiting for assist, decreased insight to deficits and amount of lines pt is hooked up too      Exercises      General Comments General comments (skin integrity, edema, etc.): HR in 110s at rest, increased to 120-130s in sitting, increased to 170s in standing, once pt returned to bed pts HR dec to 117bpm      Pertinent Vitals/Pain Pain Assessment: Faces Faces Pain Scale: Hurts a little bit Pain  Location: headache Pain Intervention(s): Monitored during session    Home Living                      Prior Function            PT Goals (current goals can now be found in the care plan section) Progress towards PT goals: Progressing toward goals    Frequency    Min 3X/week      PT Plan Current plan remains appropriate    Co-evaluation              AM-PAC PT "6 Clicks" Mobility   Outcome Measure  Help needed turning from your back to your side while in a flat bed without using bedrails?: A Lot Help needed moving from lying on your back to sitting on the side of a flat bed without using bedrails?: A Lot Help needed moving to and from a bed to a chair (including a wheelchair)?: A Lot Help needed standing up from a chair using your arms (e.g., wheelchair or bedside chair)?: A Lot Help needed to walk in hospital room?: Total Help needed climbing 3-5 steps with a railing? : Total 6 Click Score: 10    End of Session Equipment Utilized During Treatment: Gait belt Activity Tolerance: Treatment limited secondary to medical complications (Comment)(limited by tachycardia) Patient left: in bed;with bed alarm set;with nursing/sitter in room Nurse Communication: Mobility status PT Visit Diagnosis: Muscle weakness (generalized) (M62.81);Difficulty in walking, not elsewhere classified (R26.2)     Time: [id number] PT Time Calculation (min) (ACUTE ONLY): 24 min  Charges:  $Therapeutic Activity: 8-22 mins                     Kittie Plater, PT, DPT Acute Rehabilitation Services Pager #: [phone] Office #: [phone]    Berline Lopes 01/27/2020, 1:52 PM

## 2020-01-27 NOTE — Progress Notes (Signed)
  Speech Language Pathology Treatment: Dysphagia  Patient Details Name: Brendan [name] MRN: [id number] DOB: [birth year] Today's Date: 01/27/2020 Time: [id number] SLP Time Calculation (min) (ACUTE ONLY): 15 min  Assessment / Plan / Recommendation Clinical Impression  Pt was seen for potential to restart diet after going to the OR on previous date. He remains aphasic, perseverating on single words. He could tell me his name but not his birthday, with verbal expression overall at the word level ("thirsty", "please"). Pt's oropharyngeal swallow appeared to be grossly functional considering the condition of his dentition. His mastication was prolonged but he cleared his oral cavity well and there were no overt s/s of aspiration, even when drinking three ounces of water consecutively. Recommend resuming Dys 2 diet and thin liquids. SLP will f/u for tolerance, potential to advance, and additional aphasia tx.    HPI HPI: This is a 59 year old nursing home resident with DM, L CVA 2009, and CAD who is chronically anticoagulated with Lovenox due to the presence of a right lower extremity DVT despite the fact that he has a IVC filter in place.  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  He came to medical attention at his [place] home because of aphasia and weakness.  A CT scan of the head has shown acute on chronic subdural hematoma, now s/p evacuation 2/22.      SLP Plan  Continue with current plan of care       Recommendations  Diet recommendations: Dysphagia 2 (fine chop);Thin liquid Liquids provided via: Cup;Straw Medication Administration: Whole meds with liquid Supervision: Staff to assist with self feeding Compensations: Slow rate;Small sips/bites Postural Changes and/or Swallow Maneuvers: Seated upright 90 degrees                Oral Care Recommendations: Oral care BID Follow up Recommendations: Skilled Nursing  facility SLP Visit Diagnosis: Dysphagia, oral phase (R13.11) Plan: Continue with current plan of care       GO                 Osie Bond., M.A. [facility] Services Pager [phone] Office [phone]  01/27/2020, 3:58 PM

## 2020-01-27 NOTE — Progress Notes (Addendum)
Nutrition Follow-up  DOCUMENTATION CODES:   Not applicable  INTERVENTION:   Tube feeding:  -Osmolite 1.5 @ 60 ml/hr via Cortrak (1440 ml) -30 ml Prostat BID  Provides: 2360 kcals, 120 grams protein, 1097 ml free water.   Monitor intake and adjust TF as appropriate.   NUTRITION DIAGNOSIS:   Inadequate oral intake related to inability to eat as evidenced by NPO status. Ongoing.   GOAL:   Patient will meet greater than or equal to 90% of their needs Met with TF  MONITOR:   Diet advancement, Skin, TF tolerance, Weight trends, Labs, I & O's  REASON FOR ASSESSMENT:   Other (Comment)(Cortrak placement)    ASSESSMENT:   Patient with PMH significant for CKD IV, DM, essential HTN, GERD, CVA with residual hemiparesis (R side), HLD, and PVD. Presents this admission with acute on chronic SDH with AMS.    Pt discussed during ICU rounds and with RN. Pt unable to answer questions but did look at me when in his room.  Pt passed a swallow evaluation now on Dysphagia 2 diet with thin liquids. Unclear how much PO intake he will have.   2/19 cortrak placed, no TF started per MD 2/22 L crani; percutaneous nephrostomy tube exchange  2/23 TF started   Medications: colace, SS novolog Labs: Na 148 (H), K+ 5.3 (H) CBG  153-189 UOP: 805 ml   Diet Order:   Diet Order            DIET DYS 2 Room service appropriate? Yes with Assist; Fluid consistency: Thin  Diet effective now              EDUCATION NEEDS:   Not appropriate for education at this time  Skin:  Skin Assessment: Skin Integrity Issues: Skin Integrity Issues:: Other (Comment) Other: MASD- buttocks, groin  Last BM:  2/22  Height:   Ht Readings from Last 1 Encounters:  01/23/20 '6\' 1"'$  (1.854 m)    Weight:   Wt Readings from Last 1 Encounters:  01/23/20 110.3 kg    Ideal Body Weight:  83.6 kg  BMI:  Body mass index is 32.08 kg/m.  Estimated Nutritional Needs:   Kcal:  2200-2400 kcal  Protein:   110-125 grams  Fluid:  >/= 2.2 L/day  Lockie Pares., RD, LDN, CNSC See AMiON for contact information

## 2020-01-27 NOTE — Progress Notes (Signed)
Patient ID: Brendan [name], male   DOB: [birth year], 59 y.o.   MRN: [id number] BP 98/81   Pulse (!) 141   Temp 99.1 F (37.3 C) (Oral)   Resp (!) 23   Ht 6\' 1"  (1.854 m)   Wt 110.3 kg   SpO2 99%   BMI 32.08 kg/m  Opens eyes to voice.  Purposeful movement bilaterally Wound is clean, dry, without signs of infection Attempts at speech, not intelligible at this time Stable post op.

## 2020-01-27 NOTE — Progress Notes (Signed)
This RN spoke with E-Link RN who said she will update E-Link MD. Patient's heart rate has been sustaining in the 120s. Patient also has orders to have an arterial line placed (orders placed by neurosurgery). Patient's blood pressure has been stable, and he is not on pressors or vasoactive infusions, and is on room air. This RN wanted to verify with neurosurgery and CCM about the need to place an invasive line. NP Meyran of neurosurgery said it is okay to hold off on placing an arterial line and have the rounding team on days decide if it is necessary to place arterial line. E-Link MD's opinion was in agreement with neurosurgery NP. Will continue to monitor.

## 2020-01-27 NOTE — Progress Notes (Incomplete)
H&P  Chief Complaint: Hematuria  History of Present Illness:   2.23.2021:  (below copied from [facility] records):  Hydronephrosis:  Brendan [name] is a 59 year-old male established patient who is here for hydronephrosis (Stent Inserted).  The problem is on the left side. The stent was placed for a compression from mass. Patient denies kidney stone, ureteral stone, ureteral stricture, and blood clot.   He is not currently having flank pain, back pain, groin pain, nausea, vomiting, fever or chills.   Brendan [name] presents for follow-up of bilateral hydronephrosis. He has retroperitoneal fibrosis that was diagnosed~ 2008. He has a left sided nephrostomy tube that is changed every 2 months by interventional radiology at [facility].   He has a nonfunctioning right kidney. He passes minimal urine through his penis.   Because of his medical condition, he never did have ureterolysis. He now manages just with a left-sided nephrostomy tube.   12.3.2019: his last nephrostomy tube change was October 17th. He has had no issues with his urine recently, but was admitted in April of 2019 with gross hematuria. From what I can see, he is off of blood thinner. Most recent creatinine in October of this year 1.39.   Past Medical History:  Diagnosis Date  . Acidosis   . Acute unilateral obstructive uropathy   . Allergic rhinitis   . Chronic kidney disease, stage IV (severe) ([place])   . Constipation   . Coronary atherosclerosis of native coronary artery   . Diabetes mellitus without complication ([place])   . DVT (deep venous thrombosis) ([place])   . Enlarged prostate with urinary obstruction   . Essential hypertension, malignant   . GERD (gastroesophageal reflux disease)   . Glaucoma   . Hemiparesis affecting right side as late effect of cerebrovascular accident ([place])   . Hemiplegia ([place])   . Hydronephrosis   . Hyperlipidemia   . Hypertension   . Hypokalemia   . Muscle weakness (generalized)   . PVD (peripheral  vascular disease) ([place])   . Retroperitoneal fibrosis   . Stricture or kinking of ureter   . Stroke [name])    right sided hemiparesis  . Vitamin D deficiency     Past Surgical History:  Procedure Laterality Date  . INTRAVASCULAR ULTRASOUND/IVUS N/A 01/13/2020   Procedure: Intravascular Ultrasound/IVUS;  Surgeon: Serafina Mitchell, MD;  Location: [facility];  Service: Cardiovascular;  Laterality: N/A;  IJ, SVC, IVC  . IR GENERIC HISTORICAL  11/09/2016   IR NEPHROSTOMY EXCHANGE LEFT 11/09/2016 Corrie Mckusick, DO [facility]-INTERV RAD  . IR GENERIC HISTORICAL  01/09/2017   IR NEPHROSTOMY EXCHANGE LEFT 01/09/2017 [name], MD [facility]-INTERV RAD  . IR NEPHROSTOGRAM LEFT THRU EXISTING ACCESS  11/12/2019  . IR NEPHROSTOMY EXCHANGE LEFT  03/06/2017  . IR NEPHROSTOMY EXCHANGE LEFT  04/13/2017  . IR NEPHROSTOMY EXCHANGE LEFT  06/08/2017  . IR NEPHROSTOMY EXCHANGE LEFT  07/20/2017  . IR NEPHROSTOMY EXCHANGE LEFT  08/03/2017  . IR NEPHROSTOMY EXCHANGE LEFT  09/28/2017  . IR NEPHROSTOMY EXCHANGE LEFT  11/29/2017  . IR NEPHROSTOMY EXCHANGE LEFT  01/31/2018  . IR NEPHROSTOMY EXCHANGE LEFT  04/04/2018  . IR NEPHROSTOMY EXCHANGE LEFT  06/27/2018  . IR NEPHROSTOMY EXCHANGE LEFT  09/19/2018  . IR NEPHROSTOMY EXCHANGE LEFT  12/10/2018  . IR NEPHROSTOMY EXCHANGE LEFT  06/26/2019  . IR NEPHROSTOMY EXCHANGE LEFT  10/16/2019  . IR NEPHROSTOMY EXCHANGE LEFT  01/26/2020  . IVC VENOGRAPHY N/A 01/13/2020   Procedure: IVC Venography;  Surgeon: [name]  W, MD;  Location: [facility];  Service: Cardiovascular;  Laterality: N/A;  . LOWER EXTREMITY VENOGRAPHY Left 01/13/2020   Procedure: LOWER EXTREMITY VENOGRAPHY;  Surgeon: Serafina Mitchell, MD;  Location: [place] [facility];  Service: Cardiovascular;  Laterality: Left;  . NEPHROSTOMY TUBE PLACEMENT ([place] HX)      Home Medications:  Allergies as of 01/27/2020      Reactions   Other    TB serum-reaction unknown. Provided via [facility] records      Medication List    Notice    This visit is during an admission. Changes to the med list made in this visit will be reflected in the After Visit Summary of the admission.     Allergies:  Allergies  Allergen Reactions  . Other     TB serum-reaction unknown. Provided via [facility] records     Family History  Family history unknown: Yes    Social History:  reports that he has never smoked. He has never used smokeless tobacco. He reports previous alcohol use. He reports previous drug use.  ROS: A complete review of systems was performed.  All systems are negative except for pertinent findings as noted.  Physical Exam:  Vital signs in last 24 hours: There were no vitals taken for this visit. Constitutional:  Alert and oriented, No acute distress Cardiovascular: Regular rate  Respiratory: Normal respiratory effort GI: Abdomen is soft, nontender, nondistended, no abdominal masses. No CVAT.  Genitourinary: Normal male phallus, testes are descended bilaterally and non-tender and without masses, scrotum is normal in appearance without lesions or masses, perineum is normal on inspection. Lymphatic: No lymphadenopathy Neurologic: Grossly intact, no focal deficits Psychiatric: Normal mood and affect  Laboratory Data:  Recent Labs    01/25/20 0504 01/26/20 0941 01/27/20 0543  WBC 9.8 7.2 13.0*  HGB 8.2* 8.2* 7.3*  HCT 28.1* 28.4* 25.4*  PLT 325 264 370    Recent Labs    01/25/20 0504 01/26/20 0941 01/27/20 0543  NA 146* 147* 148*  K 4.5 4.4 5.3*  CL 115* 116* 120*  GLUCOSE 140* 140* 143*  BUN 40* 37* 39*  CALCIUM 8.8* 8.6* 8.6*  CREATININE 2.54* 2.45* 2.67*     Results for orders placed or performed during the hospital encounter of 01/22/20 (from the past 24 hour(s))  CBC     Status: Abnormal   Collection Time: 01/26/20  9:41 AM  Result Value Ref Range   WBC 7.2 4.0 - 10.5 K/uL   RBC 2.99 (L) 4.22 - 5.81 MIL/uL   Hemoglobin 8.2 (L) 13.0 - 17.0 g/dL   HCT 28.4 (L) 39.0 - 52.0 %   MCV 95.0 80.0 -  100.0 fL   MCH 27.4 26.0 - 34.0 pg   MCHC 28.9 (L) 30.0 - 36.0 g/dL   RDW 18.8 (H) 11.5 - 15.5 %   Platelets 264 150 - 400 K/uL   nRBC 0.8 (H) 0.0 - 0.2 %  Basic metabolic panel     Status: Abnormal   Collection Time: 01/26/20  9:41 AM  Result Value Ref Range   Sodium 147 (H) 135 - 145 mmol/L   Potassium 4.4 3.5 - 5.1 mmol/L   Chloride 116 (H) 98 - 111 mmol/L   CO2 19 (L) 22 - 32 mmol/L   Glucose, Bld 140 (H) 70 - 99 mg/dL   BUN 37 (H) 6 - 20 mg/dL   Creatinine, Ser 2.45 (H) 0.61 - 1.24 mg/dL   Calcium 8.6 (L) 8.9 -  10.3 mg/dL   GFR calc non Af Amer 28 (L) >60 mL/min   GFR calc Af Amer 32 (L) >60 mL/min   Anion gap 12 5 - 15  Glucose, capillary     Status: Abnormal   Collection Time: 01/26/20  1:49 PM  Result Value Ref Range   Glucose-Capillary 107 (H) 70 - 99 mg/dL  Glucose, capillary     Status: Abnormal   Collection Time: 01/26/20  3:12 PM  Result Value Ref Range   Glucose-Capillary 109 (H) 70 - 99 mg/dL  Glucose, capillary     Status: Abnormal   Collection Time: 01/26/20  8:34 PM  Result Value Ref Range   Glucose-Capillary 145 (H) 70 - 99 mg/dL  Glucose, capillary     Status: Abnormal   Collection Time: 01/26/20 10:15 PM  Result Value Ref Range   Glucose-Capillary 159 (H) 70 - 99 mg/dL  Glucose, capillary     Status: Abnormal   Collection Time: 01/26/20 11:58 PM  Result Value Ref Range   Glucose-Capillary 145 (H) 70 - 99 mg/dL  Glucose, capillary     Status: Abnormal   Collection Time: 01/27/20  3:13 AM  Result Value Ref Range   Glucose-Capillary 134 (H) 70 - 99 mg/dL  CBC     Status: Abnormal   Collection Time: 01/27/20  5:43 AM  Result Value Ref Range   WBC 13.0 (H) 4.0 - 10.5 K/uL   RBC 2.64 (L) 4.22 - 5.81 MIL/uL   Hemoglobin 7.3 (L) 13.0 - 17.0 g/dL   HCT 25.4 (L) 39.0 - 52.0 %   MCV 96.2 80.0 - 100.0 fL   MCH 27.7 26.0 - 34.0 pg   MCHC 28.7 (L) 30.0 - 36.0 g/dL   RDW 19.0 (H) 11.5 - 15.5 %   Platelets 370 150 - 400 K/uL   nRBC 0.8 (H) 0.0 - 0.2 %   Basic metabolic panel     Status: Abnormal   Collection Time: 01/27/20  5:43 AM  Result Value Ref Range   Sodium 148 (H) 135 - 145 mmol/L   Potassium 5.3 (H) 3.5 - 5.1 mmol/L   Chloride 120 (H) 98 - 111 mmol/L   CO2 15 (L) 22 - 32 mmol/L   Glucose, Bld 143 (H) 70 - 99 mg/dL   BUN 39 (H) 6 - 20 mg/dL   Creatinine, Ser 2.67 (H) 0.61 - 1.24 mg/dL   Calcium 8.6 (L) 8.9 - 10.3 mg/dL   GFR calc non Af Amer 25 (L) >60 mL/min   GFR calc Af Amer 29 (L) >60 mL/min   Anion gap 13 5 - 15  Glucose, capillary     Status: Abnormal   Collection Time: 01/27/20  7:41 AM  Result Value Ref Range   Glucose-Capillary 153 (H) 70 - 99 mg/dL   Comment 1 Notify RN    Comment 2 Document in Chart    Recent Results (from the past 240 hour(s))  Respiratory Panel by RT PCR (Flu A&B, Covid) - Nasopharyngeal Swab     Status: None   Collection Time: 01/23/20  1:52 AM   Specimen: Nasopharyngeal Swab  Result Value Ref Range Status   SARS Coronavirus 2 by RT PCR NEGATIVE NEGATIVE Final    Comment: (NOTE) SARS-CoV-2 target nucleic acids are NOT DETECTED. The SARS-CoV-2 RNA is generally detectable in upper respiratoy specimens during the acute phase of infection. The lowest concentration of SARS-CoV-2 viral copies this assay can detect is 131 copies/mL. A negative result does not  preclude SARS-Cov-2 infection and should not be used as the sole basis for treatment or other patient management decisions. A negative result may occur with  improper specimen collection/handling, submission of specimen other than nasopharyngeal swab, presence of viral mutation(s) within the areas targeted by this assay, and inadequate number of viral copies (<131 copies/mL). A negative result must be combined with clinical observations, patient history, and epidemiological information. The expected result is Negative. Fact Sheet for Patients:  PinkCheek.be Fact Sheet for Healthcare Providers:   GravelBags.it This test is not yet ap proved or cleared by the [place] FDA and  has been authorized for detection and/or diagnosis of SARS-CoV-2 by FDA under an Emergency Use Authorization (EUA). This EUA will remain  in effect (meaning this test can be used) for the duration of the COVID-19 declaration under Section 564(b)(1) of the Act, 21 U.S.C. section 360bbb-3(b)(1), unless the authorization is terminated or revoked sooner.    Influenza A by PCR NEGATIVE NEGATIVE Final   Influenza B by PCR NEGATIVE NEGATIVE Final    Comment: (NOTE) The Xpert Xpress SARS-CoV-2/FLU/RSV assay is intended as an aid in  the diagnosis of influenza from Nasopharyngeal swab specimens and  should not be used as a sole basis for treatment. Nasal washings and  aspirates are unacceptable for Xpert Xpress SARS-CoV-2/FLU/RSV  testing. Fact Sheet for Patients: PinkCheek.be Fact Sheet for Healthcare Providers: GravelBags.it This test is not yet approved or cleared by the [place] FDA and  has been authorized for detection and/or diagnosis of SARS-CoV-2 by  FDA under an Emergency Use Authorization (EUA). This EUA will remain  in effect (meaning this test can be used) for the duration of the  Covid-19 declaration under Section 564(b)(1) of the Act, 21  U.S.C. section 360bbb-3(b)(1), unless the authorization is  terminated or revoked. Performed at [facility], [place] [address] [place], [place] [address]   Urine culture     Status: Abnormal   Collection Time: 01/23/20  5:13 AM   Specimen: Urine, Random  Result Value Ref Range Status   Specimen Description URINE, RANDOM  Final   Special Requests   Final    NONE Performed at [facility], [place] [address] [place], [place] [address]    Culture MULTIPLE SPECIES PRESENT, SUGGEST RECOLLECTION (A)  Final   Report Status 01/23/2020 FINAL  Final  Blood  Culture (routine x 2)     Status: None (Preliminary result)   Collection Time: 01/23/20  2:00 PM   Specimen: BLOOD  Result Value Ref Range Status   Specimen Description BLOOD LEFT ANTECUBITAL  Final   Special Requests   Final    BOTTLES DRAWN AEROBIC AND ANAEROBIC Blood Culture adequate volume   Culture   Final    NO GROWTH 4 DAYS Performed at [facility], [place] [address] [place], [place] [address]    Report Status PENDING  Incomplete  MRSA PCR Screening     Status: Abnormal   Collection Time: 01/23/20  2:59 PM   Specimen: Nasopharyngeal  Result Value Ref Range Status   MRSA by PCR POSITIVE (A) NEGATIVE Final    Comment:        The GeneXpert MRSA Assay (FDA approved for NASAL specimens only), is one component of a comprehensive MRSA colonization surveillance program. It is not intended to diagnose MRSA infection nor to guide or monitor treatment for MRSA infections. RESULT CALLED TO, READ BACK BY AND VERIFIED WITH: A WARD RN 01/23/20 1832  JDW Performed at [facility], [place] [address] [place], [place] [address]   Blood Culture (routine x 2)     Status: None (Preliminary result)   Collection Time: 01/23/20  4:45 PM   Specimen: BLOOD LEFT HAND  Result Value Ref Range Status   Specimen Description BLOOD LEFT HAND  Final   Special Requests   Final    AEROBIC BOTTLE ONLY Blood Culture results may not be optimal due to an inadequate volume of blood received in culture bottles   Culture   Final    NO GROWTH 4 DAYS Performed at [facility], [place] [address] [place], [place] [address]    Report Status PENDING  Incomplete  Surgical PCR screen     Status: Abnormal   Collection Time: 01/26/20  5:14 AM   Specimen: Nasal Mucosa; Nasal Swab  Result Value Ref Range Status   MRSA, PCR POSITIVE (A) NEGATIVE Final    Comment: CRITICAL RESULT CALLED TO, READ BACK BY AND VERIFIED WITH: RN T PRIDGEN [id number] AT 80 AM BY CM    Staphylococcus aureus POSITIVE (A) NEGATIVE  Final    Comment: (NOTE) The Xpert SA Assay (FDA approved for NASAL specimens in patients 54 years of age and older), is one component of a comprehensive surveillance program. It is not intended to diagnose infection nor to guide or monitor treatment. Performed at [facility], [place] [address] [place], [place] [address]     Renal Function: Recent Labs    01/22/20 2213 01/23/20 1400 01/24/20 0517 01/25/20 0504 01/26/20 0941 01/27/20 0543  CREATININE 3.38* 3.11* 2.95* 2.54* 2.45* 2.67*   Estimated Creatinine Clearance: 38.3 mL/min (A) (by C-G formula based on SCr of 2.67 mg/dL (H)).  Radiologic Imaging: IR NEPHROSTOMY EXCHANGE LEFT  Result Date: 01/26/2020 INDICATION: 59 year old male presents for routine exchange of left percutaneous nephrostomy EXAM: IR EXCHANGE NEPHROSTOMY LEFT COMPARISON:  None. MEDICATIONS: None ANESTHESIA/SEDATION: None CONTRAST:  56mL OMNIPAQUE IOHEXOL 300 MG/ML SOLN - administered into the collecting system(s) FLUOROSCOPY TIME:  Fluoroscopy Time: 0 minutes 24 seconds (2 mGy). COMPLICATIONS: None PROCEDURE: Informed written consent was obtained from the patient after a thorough discussion of the procedural risks, benefits and alternatives. All questions were addressed. Maximal Sterile Barrier Technique was utilized including caps, mask, sterile gowns, sterile gloves, sterile drape, hand hygiene and skin antiseptic. A timeout was performed prior to the initiation of the procedure. Left: 1% lidocaine was used for local anesthesia. Small amount of contrast was infused confirming location in the collecting system. Modified Seldinger technique was then used to exchange for a new 10 [place] percutaneous nephrostomy. Catheter was formed in the collecting system and contrast confirmed location. Catheter was sutured in location and attached to gravity drainage. Patient tolerated the procedure well and remained hemodynamically stable throughout. No complications were  encountered and no significant blood loss. IMPRESSION: Status post routine exchange of left PCN. Signed, Dulcy Fanny. Dellia Nims, RPVI [facility] Electronically Signed   By: Corrie Mckusick D.O.   On: 01/26/2020 12:26    Impression/Assessment:  ***  Plan:  ***

## 2020-01-28 LAB — CULTURE, BLOOD (ROUTINE X 2)
Culture: NO GROWTH
Culture: NO GROWTH
Special Requests: ADEQUATE

## 2020-01-28 LAB — GLUCOSE, CAPILLARY
Glucose-Capillary: 187 mg/dL — ABNORMAL HIGH (ref 70–99)
Glucose-Capillary: 127 mg/dL — ABNORMAL HIGH (ref 70–99)
Glucose-Capillary: 206 mg/dL — ABNORMAL HIGH (ref 70–99)
Glucose-Capillary: 164 mg/dL — ABNORMAL HIGH (ref 70–99)
Glucose-Capillary: 150 mg/dL — ABNORMAL HIGH (ref 70–99)
Glucose-Capillary: 176 mg/dL — ABNORMAL HIGH (ref 70–99)

## 2020-01-28 LAB — BASIC METABOLIC PANEL
Anion gap: 11 (ref 5–15)
BUN: 47 mg/dL — ABNORMAL HIGH (ref 6–20)
CO2: 18 mmol/L — ABNORMAL LOW (ref 22–32)
Calcium: 8.1 mg/dL — ABNORMAL LOW (ref 8.9–10.3)
Chloride: 116 mmol/L — ABNORMAL HIGH (ref 98–111)
Creatinine, Ser: 2.78 mg/dL — ABNORMAL HIGH (ref 0.61–1.24)
GFR calc Af Amer: 27 mL/min — ABNORMAL LOW (ref >60)
GFR calc non Af Amer: 24 mL/min — ABNORMAL LOW (ref >60)
Glucose, Bld: 131 mg/dL — ABNORMAL HIGH (ref 70–99)
Potassium: 4.8 mmol/L (ref 3.5–5.1)
Sodium: 145 mmol/L (ref 135–145)

## 2020-01-28 LAB — MAGNESIUM: Magnesium: 2.3 mg/dL (ref 1.7–2.4)

## 2020-01-28 LAB — SURGICAL PATHOLOGY

## 2020-01-28 MED ORDER — DOCUSATE SODIUM 50 MG/5ML PO LIQD
50.0000 mg | Freq: Every day | ORAL | Status: DC
  Administered 2020-01-28 – 2020-02-04 (×7): 50 mg
  Filled 2020-01-28 (×8): qty 10

## 2020-01-28 MED ORDER — OXYCODONE HCL 5 MG PO TABS: 5.0000 mg | ORAL_TABLET | Freq: Three times a day (TID) | ORAL | Status: DC | PRN

## 2020-01-28 MED ORDER — CHLORHEXIDINE GLUCONATE 0.12 % MT SOLN
15.0000 mL | Freq: Two times a day (BID) | OROMUCOSAL | Status: DC
  Administered 2020-01-28 – 2020-02-12 (×30): 15 mL via OROMUCOSAL
  Filled 2020-01-28 (×29): qty 15

## 2020-01-28 MED ORDER — FAMOTIDINE 20 MG PO TABS
20.0000 mg | ORAL_TABLET | Freq: Every day | ORAL | Status: DC
  Administered 2020-01-28 – 2020-02-03 (×7): 20 mg
  Filled 2020-01-28 (×7): qty 1

## 2020-01-28 NOTE — Plan of Care (Signed)
  Problem: Activity: Goal: Risk for activity intolerance will decrease Outcome: Progressing   Problem: Nutrition: Goal: Adequate nutrition will be maintained Outcome: Progressing   Problem: Elimination: Goal: Will not experience complications related to bowel motility Outcome: Progressing Goal: Will not experience complications related to urinary retention Outcome: Progressing   Problem: Pain Managment: Goal: General experience of comfort will improve Outcome: Progressing   Problem: Safety: Goal: Ability to remain free from injury will improve Outcome: Progressing   Problem: Activity: Goal: Ability to tolerate increased activity will improve Outcome: Progressing

## 2020-01-28 NOTE — Progress Notes (Signed)
Occupational Therapy Treatment Patient Details Name: Brendan [name] MRN: [id number] DOB: 04-09-1961 Today's Date: 01/28/2020    History of present illness 59 year old nursing home resident with DM, L CVA 2009, and CAD who is chronically anticoagulated with Lovenox due to the presence of a right lower extremity DVT despite the fact that he has a IVC filter in place.  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  He came to medical attention at his [place] home because of aphasia and weakness.  A CT scan of the head has shown acute on chronic subdural hematoma., pt s/p evacuation of L SDH 01/26/20   OT comments  Pt progressing toward stated goals. Continuing to make minimal one word verbalizations throughout session and responding to ~50% of one step commands. Pt completed bed mobility at mod A +2 and stand pivot transfer to chair at max A +2. He continues to require multimodal cueing to cognitively engage in functional task completion. RUE remains without active movement, palm protector in place without complaint or skin compromise. D/c rec remain appropriate. Will continue to follow.   Follow Up Recommendations  SNF;Supervision/Assistance - 24 hour    Equipment Recommendations  None recommended by OT    Recommendations for Other Services      Precautions / Restrictions Precautions Precautions: Fall;Other (comment) Precaution Comments: R hemiplegia, cortrak, nephrostomy drain Restrictions Weight Bearing Restrictions: No       Mobility Bed Mobility Overal bed mobility: Needs Assistance Bed Mobility: Supine to Sit     Supine to sit: Mod assist;+2 for physical assistance     General bed mobility comments: ModA + 2 for RLE negotiation and trunk elevation to transfer edge of bed, pt initiated using LUE/LLE  Transfers Overall transfer level: Needs assistance Equipment used: None Transfers: Sit to/from  [facility] Sit to Stand: Max assist;+2 physical assistance Stand pivot transfers: Max assist;+2 physical assistance       General transfer comment: MaxA + 2 to stand and pivot towards left with use of bed pad and gait belt to boost hips up. R knee blocked but no buckle    Balance Overall balance assessment: Needs assistance Sitting-balance support: Single extremity supported;Feet unsupported Sitting balance-Leahy Scale: Poor Sitting balance - Comments: Pt requiring min guard - modA to sit edge of bed. Forward head and kyphotic posture   Standing balance support: During functional activity Standing balance-Leahy Scale: Poor                             ADL either performed or assessed with clinical judgement   ADL Overall ADL's : Needs assistance/impaired                     Lower Body Dressing: Maximal assistance;Bed level Lower Body Dressing Details (indicate cue type and reason): Max A for donning socks Toilet Transfer: Maximal assistance;+2 for physical assistance;+2 for safety/equipment;Stand-pivot Toilet Transfer Details (indicate cue type and reason): simulated to recliner; max A +2 to power up to steady           General ADL Comments: Pt presenting with decreased functional use or RUE, balance, and cognition.      Vision   Additional Comments: making eye contact and tracking   Perception     Praxis      Cognition Arousal/Alertness: Awake/alert Behavior During Therapy: Flat affect;Impulsive Overall Cognitive Status: Impaired/Different from  baseline Area of Impairment: Following commands;Safety/judgement;Problem solving                       Following Commands: Follows one step commands inconsistently Safety/Judgement: Decreased awareness of safety;Decreased awareness of deficits Awareness: Intellectual Problem Solving: Slow processing;Requires verbal cues;Requires tactile cues General Comments: Pt with minimal  verbalizations, responding yes/no inconsistently. Responds to pain.         Exercises     Shoulder Instructions       General Comments      Pertinent Vitals/ Pain       Pain Assessment: Faces Faces Pain Scale: No hurt Pain Location: grimacing with cervical movement Pain Descriptors / Indicators: Grimacing Pain Intervention(s): Monitored during session;Repositioned;Limited activity within patient's tolerance  Home Living                                          Prior Functioning/Environment              Frequency  Min 2X/week        Progress Toward Goals  OT Goals(current goals can now be found in the care plan section)  Progress towards OT goals: Progressing toward goals  Acute Rehab OT Goals Patient Stated Goal: unable to state OT Goal Formulation: With patient Time For Goal Achievement: 02/08/20 Potential to Achieve Goals: Good  Plan Discharge plan remains appropriate    Co-evaluation    PT/OT/SLP Co-Evaluation/Treatment: Yes Reason for Co-Treatment: Necessary to address cognition/behavior during functional activity;For patient/therapist safety;To address functional/ADL transfers PT goals addressed during session: Mobility/safety with mobility OT goals addressed during session: ADL's and self-care;Strengthening/ROM;Proper use of Adaptive equipment and DME      AM-PAC OT "6 Clicks" Daily Activity     Outcome Measure   Help from another person eating meals?: A Lot Help from another person taking care of personal grooming?: A Lot Help from another person toileting, which includes using toliet, bedpan, or urinal?: A Lot Help from another person bathing (including washing, rinsing, drying)?: A Lot Help from another person to put on and taking off regular upper body clothing?: A Lot Help from another person to put on and taking off regular lower body clothing?: A Lot 6 Click Score: 12    End of Session    OT Visit Diagnosis:  Unsteadiness on feet (R26.81);Other abnormalities of gait and mobility (R26.89);Muscle weakness (generalized) (M62.81);Other symptoms and signs involving cognitive function;Hemiplegia and hemiparesis Hemiplegia - Right/Left: Right   Activity Tolerance Patient tolerated treatment well   Patient Left in chair;with call bell/phone within reach;with chair alarm set   Nurse Communication Mobility status;Need for lift equipment        Time: 1042-1105 OT Time Calculation (min): 23 min  Charges: OT General Charges $OT Visit: 1 Visit OT Treatments $Neuromuscular Re-education: 8-22 mins  Zenovia Jarred, MSOT, OTR/L [facility] Office Number: [phone] Pager: [phone]  Zenovia Jarred 01/28/2020, 5:39 PM

## 2020-01-28 NOTE — Progress Notes (Signed)
NAME:  Brendan [name], MRN:  [id number], DOB:  1960-12-23, LOS: 5 ADMISSION DATE:  01/22/2020, CHIEF COMPLAINT:  Aphasia and weakness   Brief History   This is a 59 year old nursing home resident with 59 year old man with acute on chronic subdural hematoma.  On lovenox for RLE DVT. He was Sent to [facility] from SNF where he was felt to have worsening left sided weakness and aphasia starting at 1800 on 2/18 but had been noted to not be himself earlier that day.  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  He came to medical attention at his nursing home because of aphasia and weakness.  A CT scan of the head has shown acute on chronic subdural hematoma.  There is also some encephalomalacia on the left underlying the hematoma consequently there is no midline shift.   Also having worsening hematuria. Neurosurgery has evaluated the patient and is following. His lovenox was reversed with protamine on admission. PCCM contacted for admission.  Past Medical History  Otherwise remarkable for coronary disease diabetes history of DVT retroperitoneal fibrosis, prior CVA and IVC filter placement  Significant Hospital Events   2/18 >>admission  Consults:  Neurosurgery Nephrology  Procedures:  Left craniotomy for subdural hematoma evacuation 2/22 (Dr. Christella Noa) Percutaneous nephrostomy tube exchange 2/22 (Dr. Earleen Newport, IR)  Significant Diagnostic Tests:  2/21 CT Head > Some further enlargement of the subdural hematoma on the left. Overall volume increase estimated at about 1/3. Maximal thickness now measured at 2.5 cm compared with 2 cm previously. More hyperdense component. Summary distribution, including along the posterior falx and the tentorium. Increased mass effect with left-to-right shift of 7 mm today, increased from 4 mm previously.  2/20 KUB - no IVC filter in place  Micro Data:  COVID 2/19 > Negative  Urine cultures 2/19 >  polymicrobial  Blood cultures 2/19 > negative MRSA PCR 2/19 > Positive   Antimicrobials:  Cefepime 2/19 >> 2/21  Interim history/subjective:  Tube feeding restarted  Objective   Blood pressure 108/71, pulse (!) 106, temperature 99.6 F (37.6 C), temperature source Axillary, resp. rate (!) 24, height 6\' 1"  (1.854 m), weight 110.3 kg, SpO2 100 %.        Intake/Output Summary (Last 24 hours) at 01/28/2020 [id number] Last data filed at 01/28/2020 0800 Gross per 24 hour  Intake 2243.51 ml  Output 975 ml  Net 1268.51 ml   Filed Weights   01/22/20 2158 01/23/20 1500  Weight: 112 kg 110.3 kg    Examination: General: Chronically ill-appearing man, laying comfortably in bed HEENT: Craniotomy staples on the left, incision clean, increased left periorbital edema now with his eye closed. Neuro: He is awake, right eye open, now able to phonate with some words that are understandable, slight perseveration with questions.  Moves all extremities with residual right-sided weakness CV: Distant, regular, no murmur PULM: Clear bilaterally, no wheeze GI: Nondistended, no apparent tenderness, positive bowel sounds GU: Yellow urine in his left-sided nephrostomy bag Extremities: Continues to have 3+ right lower extremity edema, trace to 1+ on the left Skin: No rash  Resolved Hospital Problem list    Fever - on admission - received 48 hours empiric cefepime. No evidence of pneumonia, UTI or positive blood cultures.  Assessment & Plan:  Brendan [name] is a 59 yo gentleman with a past medical history of CKD, HTN and obstructive uropathy with left nephrostomy tubes who lives in a SNF and  presented on 2/19 with:  Acute on chronic subdural hematoma, some interval neurological improvement 2/24 P: Successful craniotomy and clot evacuation on 2/22, appreciate Dr. Hewitt Shorts management.  Repeat imaging as per his plans Continue neuro protective measures; goal for eurothermia, euglycemia, eunatermia, normoxia, and  PCO2 goal of 35-40 Associated encephalopathy and dysphagia, SLP evaluation but not ready for p.o. feeds yet.  Restart tube feeding 2/23 Keppra as ordered  Acute metabolic encephalopathy  -Secondary to acute on chronic subdural hematoma resulting in worsening lethargy and aphasia consistent with location of hematoma.  - Unable to locate image of head CT 2/18 however per radiology worsening midline shift seen on head CT 2/21 P: Continue to minimize sedating medications Start working with PT/OT  DVT right leg s/p treatment with enoxaparin due to Xarelto failure. -Vascular surgery attempted to place IVCF 2/9 with inability to place a inferior vena cava filter as the infrarenal IVC was occluded P: Continue to hold anticoagulation until cleared to restart by neurosurgery We will discuss right lower extremity DVT, potential for reattempt IVC filter with Dr. Earleen Newport in IR.  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  Flush as per protocol Follow BMP, urine output, I/O positive 3.0 L Continue mycophenolate  Hypertension Hydralazine and labetalol ordered as needed  Multifactorial anemia due to acute/chronic blood loss and chronic renal disease.  P: Conservative transfusion protocol, goal hemoglobin 7.0 Follow CBC   Best practice:  Diet: Dysphagia 2, tube feeding Pain/Anxiety/Delirium protocol (if indicated): Not indicated VAP protocol (if indicated): Not currently indicated DVT prophylaxis: will placed SCD on the left side, right leg has DVT.  GI prophylaxis: Pepcid ordered Glucose control: Sliding scale insulin ordered Mobility: Bedrest Code Status:  Full Family: spoke w Mom 2/23 who wants pt's sister Elmo Putt to be main point of contact. Reviewed status with her on 2/23 Disposition: ICU.   Labs   CBC: Recent Labs  Lab 01/22/20 2213 01/23/20 0235 01/23/20 1900 01/24/20 0517 01/25/20 0504 01/26/20 0941 01/27/20 0543  WBC 9.7   < > 11.5* 10.8* 9.8 7.2 13.0*  NEUTROABS 7.9*  --  9.7*  --  7.9*  --   --   HGB 6.4*   < > 7.9* 8.1* 8.2* 8.2* 7.3*  HCT 22.2*   < > 26.5* 27.1* 28.1* 28.4* 25.4*  MCV 94.5   < > 91.4 92.2 93.7 95.0 96.2  PLT 376   < > 319 309 325 264 370   < > = values in this interval not displayed.    Basic Metabolic Panel: Recent Labs  Lab 01/24/20 0517 01/25/20 0504 01/26/20 0941 01/27/20 0543 01/28/20 0529  NA 141 146* 147* 148* 145  K 4.6 4.5 4.4 5.3* 4.8  CL 108 115* 116* 120* 116*  CO2 19* 17* 19* 15* 18*  GLUCOSE 138* 140* 140* 143* 131*  BUN 47* 40* 37* 39* 47*  CREATININE 2.95* 2.54* 2.45* 2.67* 2.78*  CALCIUM 8.6* 8.8* 8.6* 8.6* 8.1*  MG  --   --   --   --  2.3   GFR: Estimated Creatinine Clearance: 36.8 mL/min (A) (by C-G formula based on SCr of 2.78 mg/dL (H)). Recent Labs  Lab 01/23/20 1400 01/23/20 1645 01/23/20 1900 01/24/20 0517 01/24/20 0842 01/24/20 1031 01/25/20  [id number] 01/26/20 0941 01/27/20 0543  WBC 10.4  --    < > 10.8*  --   --  9.8 7.2 13.0*  LATICACIDVEN 1.0 2.6*  --   --  3.1* 1.9  --   --   --    < > = values in this interval not displayed.    Liver Function Tests: Recent Labs  Lab 01/22/20 2213 01/24/20 0517  AST 57* 48*  ALT 88* 76*  ALKPHOS 70 55  BILITOT 0.7 1.3*  PROT 7.6 7.2  ALBUMIN 3.4* 2.9*   No results for input(s): LIPASE, AMYLASE in the last 168 hours. No results for input(s): AMMONIA in the last 168 hours.  ABG    Component Value Date/Time   TCO2 25 01/13/2020 1007     Coagulation Profile: Recent Labs  Lab 01/22/20 2213 01/23/20 0235  INR 1.2 1.2    Cardiac Enzymes: No results for input(s): CKTOTAL, CKMB, CKMBINDEX, TROPONINI in  the last 168 hours.  HbA1C: Hgb A1c MFr Bld  Date/Time Value Ref Range Status  01/24/2020 05:17 AM 5.0 4.8 - 5.6 % Final    Comment:    (NOTE) Pre diabetes:          5.7%-6.4% Diabetes:              >6.4% Glycemic control for   <7.0% adults with diabetes   01/23/2020 04:32 PM 5.2 4.8 - 5.6 % Final    Comment:    (NOTE) Pre diabetes:          5.7%-6.4% Diabetes:              >6.4% Glycemic control for   <7.0% adults with diabetes     CBG: Recent Labs  Lab 01/27/20 1520 01/27/20 1935 01/27/20 2310 01/28/20 0302 01/28/20 0742  GLUCAP 165* 155* 154* 187* 127*   CRITICAL CARE Performed by: Collene Gobble   Total critical care time: 31 minutes  Critical care time was exclusive of separately billable procedures and treating other patients.  Critical care was necessary to treat or prevent imminent or life-threatening deterioration.  Critical care was time spent personally by me on the following activities: development of treatment plan with patient and/or surrogate as well as nursing, discussions with consultants, evaluation of patient's response to treatment, examination of patient, obtaining history from patient or surrogate, ordering and performing treatments and interventions, ordering and review of laboratory studies, ordering and review of radiographic studies, pulse oximetry and re-evaluation of patient's condition.   Baltazar Apo, MD, PhD 01/28/2020, 8:21 AM [facility] [phone] or if no answer [phone]

## 2020-01-28 NOTE — Progress Notes (Signed)
Patient ID: Brendan [name], male   DOB: 02-Mar-1961, 59 y.o.   MRN: [id number] BP 124/75   Pulse (!) 118   Temp 99.2 F (37.3 C) (Axillary)   Resp 19   Ht 6\' 1"  (1.854 m)   Wt 110.3 kg   SpO2 100%   BMI 32.08 kg/m  Alert, conversant. Speech is halting Following commands Wound is clean, dry, no signs of infection Improving neurologically

## 2020-01-28 NOTE — NC FL2 (Signed)
[place] LEVEL OF CARE SCREENING TOOL     IDENTIFICATION  [name] Birthdate: [birth year] Sex: male Admission Date (Current Location): 01/22/2020  [facility] and [place] Number:  [employer] and Address:  [facility] [place] [place], [place] [address] [place], [place] [address]      Provider Number: [id number]  Attending Physician Name and Address:  Collene Gobble, MD  Relative Name and Phone Number:       Current Level of Care: Hospital Recommended Level of Care: [place] Prior Approval Number:    Date Approved/Denied:   PASRR Number: [id number]  Discharge Plan: SNF    Current Diagnoses: Patient Active Problem List   Diagnosis Date Noted  . Subdural hematoma, acute ([place]) 01/26/2020  . SDH (subdural hematoma) ([place]) 01/23/2020  . DVT (deep venous thrombosis) ([place]) 12/19/2019  . Right leg DVT ([place]) 12/18/2019  . Symptomatic anemia 03/22/2018  . History of DVT in adulthood 03/22/2018  . Gross hematuria   . Nephrostomy tube displaced ([place]) 07/19/2017  . Pyelonephritis 02/16/2017  . 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 Coronary atherosclerosis of native coronary artery   . Essential hypertension   . Stroke ([place])   . Diabetes mellitus without complication ([place])   . Chronic kidney disease, stage IV (severe) ([place])   . GERD (gastroesophageal reflux disease)   . Glaucoma   . Hemiparesis affecting right side as late effect of cerebrovascular accident ([place])   . Enlarged prostate with urinary obstruction   . Constipation     Orientation RESPIRATION BLADDER Height & Weight     Self  Normal Incontinent Weight: 243 lb 2.7 oz (110.3 kg) Height:  6\' 1"  (185.4 cm)  BEHAVIORAL SYMPTOMS/MOOD NEUROLOGICAL BOWEL NUTRITION STATUS      Incontinent Diet(Dsy 2 fine chop thin liquid)  AMBULATORY STATUS COMMUNICATION OF NEEDS Skin   Extensive Assist Non-Verbally Normal, Surgical wounds(surgical wound on head)                      Personal Care Assistance Level of Assistance  Bathing, Feeding, Dressing Bathing Assistance: Maximum assistance Feeding assistance: Maximum assistance Dressing Assistance: Maximum assistance     Functional Limitations Info  Speech     Speech Info: Impaired(expressive aphasia)    SPECIAL CARE FACTORS FREQUENCY  PT (By licensed PT), OT (By licensed OT), Speech therapy     PT Frequency: 5x a week OT Frequency: 5x a week     Speech Therapy Frequency: 5x a week      Contractures Contractures Info: Not present    Additional Factors Info  Code Status, Insulin Sliding Scale Code Status Info: Full Allergies Info: TB serum, reaction unknown   Insulin Sliding Scale Info: Novolog 0-6 units every 4 hours       Current Medications (01/28/2020):  This is the current hospital active medication list Current Facility-Administered Medications  Medication Dose Route Frequency Provider Last Rate Last Admin  . 0.9 %  sodium chloride infusion   Intravenous Continuous Ashok Pall, MD 10 mL/hr at 01/28/20 1100 Rate Verify at 01/28/20 1100  . 0.9 %  sodium chloride infusion   Intra-arterial PRN Ashok Pall, MD      . acetaminophen (TYLENOL) 160 MG/5ML solution 325 mg  325 mg Oral Q4H PRN Ashok Pall, MD      . chlorhexidine (PERIDEX) 0.12 % solution 15 mL  15 mL Mouth Rinse BID Collene Gobble, MD      .  Chlorhexidine Gluconate Cloth 2 % PADS 6 each  6 each Topical Daily Ashok Pall, MD   6 each at 01/28/20 0900  . docusate (COLACE) 50 MG/5ML liquid 50 mg  50 mg Per Tube Daily Collene Gobble, MD   50 mg at 01/28/20 0920  . famotidine (PEPCID) tablet 20 mg  20 mg Per Tube QHS Collene Gobble, MD      . feeding supplement (OSMOLITE 1.5 CAL) liquid 1,000 mL  1,000 mL Per Tube Continuous Collene Gobble, MD 60 mL/hr at 01/28/20 1200 Rate Verify at 01/28/20 1200  . feeding supplement (PRO-STAT SUGAR FREE 64) liquid 30 mL  30 mL Per Tube BID Collene Gobble, MD   30 mL at  01/28/20 [id number]  . hydrALAZINE (APRESOLINE) injection 10 mg  10 mg Intravenous Q4H PRN Ashok Pall, MD      . insulin aspart (novoLOG) injection 0-6 Units  0-6 Units Subcutaneous Q4H Ashok Pall, MD   2 Units at 01/28/20 1156  . labetalol (NORMODYNE) injection 5-20 mg  5-20 mg Intravenous Q30 min PRN Ashok Pall, MD   20 mg at 01/27/20 1822  . levETIRAcetam (KEPPRA) tablet 500 mg  500 mg Oral BID Ashok Pall, MD       Or  . levETIRAcetam (KEPPRA) IVPB 500 mg/100 mL premix  500 mg Intravenous BID Ashok Pall, MD   Stopped at 01/28/20 [phone]  . lidocaine (PF) (XYLOCAINE) 1 % injection   Infiltration PRN Corrie Mckusick, DO   10 mL at 01/26/20 1204  . MEDLINE mouth rinse  15 mL Mouth Rinse BID Ashok Pall, MD   15 mL at 01/28/20 1030  . morphine 2 MG/ML injection 2-6 mg  2-6 mg Intravenous Q3H PRN Ashok Pall, MD   2 mg at 01/27/20 0308  . mycophenolate (CELLCEPT) oral suspension 50 mg/mL  500 mg Per Tube BID Collene Gobble, MD   500 mg at 01/28/20 0948  . oxyCODONE (Oxy IR/ROXICODONE) immediate release tablet 5 mg  5 mg Per Tube Q8H PRN Collene Gobble, MD      . polyethylene glycol (MIRALAX / GLYCOLAX) packet 17 g  17 g Oral Daily PRN Ashok Pall, MD         Discharge Medications: Please see discharge summary for a list of discharge medications.  Relevant Imaging Results:  Relevant Lab Results:   Additional Information SSN [id number]  Emeterio Reeve, [place]

## 2020-01-28 NOTE — Progress Notes (Addendum)
Physical Therapy Treatment Patient Details Name: Brendan [name] MRN: [id number] DOB: Mar 30, 1961 Today's Date: 01/28/2020    History of Present Illness 59 year old nursing home resident with DM, L CVA 2009, and CAD who is chronically anticoagulated with Lovenox due to the presence of a right lower extremity DVT despite the fact that he has a IVC filter in place.  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  He came to medical attention at his [place] home because of aphasia and weakness.  A CT scan of the head has shown acute on chronic subdural hematoma., pt s/p evacuation of L SDH 01/26/20    PT Comments    Pt progressing towards physical therapy goals, able to tolerate stand pivot transfer to chair today with two person assist. HR 114-130 bpm, BP stable. Continues with right hemiplegia, balance impairments, cognitive deficits, and decreased activity tolerance. D/c plan remains appropriate.    Follow Up Recommendations  SNF;Supervision/Assistance - 24 hour     Equipment Recommendations  None recommended by PT    Recommendations for Other Services       Precautions / Restrictions Precautions Precautions: Fall;Other (comment) Precaution Comments: R hemiplegia, cortrak, nephrostomy drain Restrictions Weight Bearing Restrictions: No    Mobility  Bed Mobility Overal bed mobility: Needs Assistance Bed Mobility: Supine to Sit     Supine to sit: Mod assist;+2 for physical assistance     General bed mobility comments: ModA + 2 for RLE negotiation and trunk elevation to transfer edge of bed, pt initiated using LUE/LLE  Transfers Overall transfer level: Needs assistance Equipment used: None Transfers: Sit to/from [employer] Sit to Stand: Max assist;+2 physical assistance Stand pivot transfers: Max assist;+2 physical assistance       General transfer comment: MaxA + 2 to stand and pivot  towards left with use of bed pad and gait belt to boost hips up. R knee blocked but no buckle  Ambulation/Gait             General Gait Details: deferred   Stairs             Wheelchair Mobility    Modified Rankin (Stroke Patients Only) Modified Rankin (Stroke Patients Only) Pre-Morbid Rankin Score: Moderately severe disability Modified Rankin: Severe disability     Balance Overall balance assessment: Needs assistance Sitting-balance support: Single extremity supported;Feet unsupported Sitting balance-Leahy Scale: Poor Sitting balance - Comments: Pt requiring min guard - modA to sit edge of bed. Forward head and kyphotic posture   Standing balance support: During functional activity Standing balance-Leahy Scale: Poor                              Cognition Arousal/Alertness: Awake/alert Behavior During Therapy: Flat affect;Impulsive Overall Cognitive Status: Impaired/Different from baseline Area of Impairment: Following commands;Safety/judgement;Problem solving                       Following Commands: Follows one step commands inconsistently Safety/Judgement: Decreased awareness of safety;Decreased awareness of deficits Awareness: Intellectual Problem Solving: Slow processing;Requires verbal cues;Requires tactile cues General Comments: Pt with minimal verbalizations, responding yes/no inconsistently. Responds to pain.       Exercises      General Comments  BP 118/78 (88) sitting edge of bed, 107/67 (79) sitting in recliner      Pertinent Vitals/Pain Pain Assessment: Faces Faces Pain Scale: No  hurt Pain Location: grimacing with cervical movement Pain Descriptors / Indicators: Grimacing Pain Intervention(s): Monitored during session;Repositioned;Limited activity within patient's tolerance    Home Living                      Prior Function            PT Goals (current goals can now be found in the care plan section)  Acute Rehab PT Goals Patient Stated Goal: unable to state Potential to Achieve Goals: Fair Progress towards PT goals: Progressing toward goals    Frequency    Min 3X/week      PT Plan Current plan remains appropriate    Co-evaluation PT/OT/SLP Co-Evaluation/Treatment: Yes Reason for Co-Treatment: Complexity of the patient's impairments (multi-system involvement);Necessary to address cognition/behavior during functional activity;For patient/therapist safety;To address functional/ADL transfers PT goals addressed during session: Mobility/safety with mobility        AM-PAC PT "6 Clicks" Mobility   Outcome Measure  Help needed turning from your back to your side while in a flat bed without using bedrails?: A Lot Help needed moving from lying on your back to sitting on the side of a flat bed without using bedrails?: A Lot Help needed moving to and from a bed to a chair (including a wheelchair)?: A Lot Help needed standing up from a chair using your arms (e.g., wheelchair or bedside chair)?: Total Help needed to walk in hospital room?: Total Help needed climbing 3-5 steps with a railing? : Total 6 Click Score: 9    End of Session Equipment Utilized During Treatment: Gait belt Activity Tolerance: Patient tolerated treatment well Patient left: with bed alarm set;in chair;with chair alarm set Nurse Communication: Mobility status PT Visit Diagnosis: Muscle weakness (generalized) (M62.81);Difficulty in walking, not elsewhere classified (R26.2)     Time: [id number] PT Time Calculation (min) (ACUTE ONLY): 26 min  Charges:  $Therapeutic Activity: 8-22 mins                       Wyona Almas, PT, DPT Acute Rehabilitation Services Pager [phone] Office [phone]    HAPPY COVIN 01/28/2020, 1:27 PM

## 2020-01-28 NOTE — Progress Notes (Signed)
  Speech Language Pathology Treatment: Dysphagia;Cognitive-Linquistic   Patient Details Name: Brendan [name] MRN: [id number] DOB: 09-29-1961 Today's Date: 01/28/2020 Time: 1212-1229 SLP Time Calculation (min) (ACUTE ONLY): 17 min  Assessment / Plan / Recommendation Clinical Impression  Pt was eating his lunch meal upon SLP arrival, upright in the chair and self-feeding. He seems to have difficulty controlling how much gets on his spoon, taking a very large bite of magic cup that was in his mouth for a prolonged period and followed with mild throat clearing. SLP provided assist for scooping for better portion control, with no further coughing or throat clearing noted. He did not make selections from a field of two, but would say "no" if he did not want to eat something (which primarily was the chopped foods on his plate). SLP also attempted naming and discrimination tasks, providing Max cues but with limited responses from patient. Mod cues were provided for simple, one-step commands during functional tasks. Will continue to follow.   HPI HPI: This is a 59 year old nursing home resident with DM, L CVA 2009, and CAD who is chronically anticoagulated with Lovenox due to the presence of a right lower extremity DVT despite the fact that he has a IVC filter in place.  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  He came to medical attention at his [place] home because of aphasia and weakness.  A CT scan of the head has shown acute on chronic subdural hematoma, now s/p evacuation 2/22.      SLP Plan  Continue with current plan of care       Recommendations  Diet recommendations: Dysphagia 2 (fine chop);Thin liquid Liquids provided via: Cup;Straw Medication Administration: Whole meds with liquid Supervision: Staff to assist with self feeding Compensations: Slow rate;Small sips/bites Postural Changes and/or Swallow Maneuvers:  Seated upright 90 degrees                Oral Care Recommendations: Oral care BID Follow up Recommendations: Skilled Nursing facility SLP Visit Diagnosis: Dysphagia, oral phase (R13.11);Aphasia (R47.01) Plan: Continue with current plan of care       GO                 Osie Bond., M.A. [place] [facility] Rehabilitation Services Pager [phone] Office [phone]  01/28/2020, 12:59 PM

## 2020-01-28 NOTE — TOC Initial Note (Addendum)
Transition of Care [facility]) - Initial/Assessment Note    Patient Details  Name: Brendan [name] MRN: [id number] Date of Birth: [birth year]  Transition of Care [facility]) CM/SW Contact:    Emeterio Reeve, [place] Phone Number: 01/28/2020, 2:11 PM  Clinical Narrative:   59 year old nursing home resident with acute on chronic subdural hematoma., pt s/p evacuation of L SDH 01/26/20 Patient is a resident at [facility].  CSW spoke with [facility] admissions coordinator, Larene Beach, to inquire about his ability to make own decisions, legal guardian and family supports. Larene Beach states pt does not have a legal guardian and makes all of his decisions. Larene Beach stated pt does talk to mother frequently.  Larene Beach also states pt is on parole and is required to return to facility at discharge. CSW completed FL2.          Emeterio Reeve, [place], [place] Licensed Holiday representative  Expected Discharge Plan: Skilled Nursing Facility Barriers to Discharge: Continued Medical Work up   Patient Goals and CMS Choice        Expected Discharge Plan and Services Expected Discharge Plan: [place]   Discharge Planning Services: CM Consult   Living arrangements for the past 2 months: [place]                                      Prior Living Arrangements/Services Living arrangements for the past 2 months: [place] Lives with:: Facility Resident Patient language and need for interpreter reviewed:: Yes Do you feel safe going back to the place where you live?: Yes      Need for Family Participation in Patient Care: No (Comment) Care giver support system in place?: No (comment)   Criminal Activity/Legal Involvement Pertinent to Current Situation/Hospitalization: Yes - Comment as needed(Patient is on parole)  Activities of Daily Living Home Assistive Devices/Equipment: Wheelchair ADL Screening (condition at time of admission) Patient's cognitive ability  adequate to safely complete daily activities?: No Is the patient deaf or have difficulty hearing?: No Does the patient have difficulty seeing, even when wearing glasses/contacts?: No Does the patient have difficulty concentrating, remembering, or making decisions?: No Patient able to express need for assistance with ADLs?: No Does the patient have difficulty dressing or bathing?: No Independently performs ADLs?: No Communication: Independent Dressing (OT): Dependent Is this a change from baseline?: Pre-admission baseline Grooming: Dependent Is this a change from baseline?: Pre-admission baseline Feeding: Independent Bathing: Dependent Is this a change from baseline?: Pre-admission baseline Toileting: Dependent Is this a change from baseline?: Pre-admission baseline In/Out Bed: Dependent Is this a change from baseline?: Pre-admission baseline Walks in Home: Dependent Does the patient have difficulty walking or climbing stairs?: Yes Weakness of Legs: Both Weakness of Arms/Hands: Both  Permission Sought/Granted                  Emotional Assessment       Orientation: : Oriented to Self Alcohol / Substance Use: Not Applicable    Admission diagnosis:  SDH (subdural hematoma) ([place]) [S06.5X9A] Current use of long term anticoagulation [Z79.01] Subdural hematoma without coma ([place]) [S06.5X9A] Hematuria [R31.9] Subdural hematoma, acute ([place]) [S06.5X9A] Patient Active Problem List   Diagnosis Date Noted  . Subdural hematoma, acute ([place]) 01/26/2020  . SDH (subdural hematoma) ([place]) 01/23/2020  . DVT (deep venous thrombosis) ([place]) 12/19/2019  . Right leg DVT ([place]) 12/18/2019  . Symptomatic anemia 03/22/2018  .  History of DVT in adulthood 03/22/2018  . Gross hematuria   . Nephrostomy tube displaced ([place]) 07/19/2017  . Pyelonephritis 02/16/2017  . 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 Coronary atherosclerosis of native coronary artery   . Essential hypertension   . Stroke  ([place])   . Diabetes mellitus without complication ([place])   . Chronic kidney disease, stage IV (severe) ([place])   . GERD (gastroesophageal reflux disease)   . Glaucoma   . Hemiparesis affecting right side as late effect of cerebrovascular accident ([place])   . Enlarged prostate with urinary obstruction   . Constipation    PCP:  Cleda Mccreedy, MD Pharmacy:  No Pharmacies Listed    Social Determinants of Health (SDOH) Interventions    Readmission Risk Interventions No flowsheet data found.

## 2020-01-29 ENCOUNTER — Inpatient Hospital Stay: Payer: Self-pay

## 2020-01-29 ENCOUNTER — Inpatient Hospital Stay (HOSPITAL_COMMUNITY): Payer: Medicaid Other

## 2020-01-29 LAB — GLUCOSE, CAPILLARY
Glucose-Capillary: 146 mg/dL — ABNORMAL HIGH (ref 70–99)
Glucose-Capillary: 166 mg/dL — ABNORMAL HIGH (ref 70–99)
Glucose-Capillary: 104 mg/dL — ABNORMAL HIGH (ref 70–99)
Glucose-Capillary: 159 mg/dL — ABNORMAL HIGH (ref 70–99)
Glucose-Capillary: 184 mg/dL — ABNORMAL HIGH (ref 70–99)
Glucose-Capillary: 192 mg/dL — ABNORMAL HIGH (ref 70–99)

## 2020-01-29 LAB — BASIC METABOLIC PANEL
Anion gap: 8 (ref 5–15)
BUN: 45 mg/dL — ABNORMAL HIGH (ref 6–20)
CO2: 19 mmol/L — ABNORMAL LOW (ref 22–32)
Calcium: 7.8 mg/dL — ABNORMAL LOW (ref 8.9–10.3)
Chloride: 114 mmol/L — ABNORMAL HIGH (ref 98–111)
Creatinine, Ser: 2.28 mg/dL — ABNORMAL HIGH (ref 0.61–1.24)
GFR calc Af Amer: 35 mL/min — ABNORMAL LOW (ref >60)
GFR calc non Af Amer: 30 mL/min — ABNORMAL LOW (ref >60)
Glucose, Bld: 136 mg/dL — ABNORMAL HIGH (ref 70–99)
Potassium: 5.2 mmol/L — ABNORMAL HIGH (ref 3.5–5.1)
Sodium: 141 mmol/L (ref 135–145)

## 2020-01-29 MED ORDER — SODIUM CHLORIDE 0.9% FLUSH
10.0000 mL | INTRAVENOUS | Status: DC | PRN
  Administered 2020-02-07: 10 mL

## 2020-01-29 MED ORDER — SODIUM CHLORIDE 0.9% FLUSH
10.0000 mL | Freq: Two times a day (BID) | INTRAVENOUS | Status: DC
  Administered 2020-01-29 – 2020-02-13 (×26): 10 mL

## 2020-01-29 NOTE — Progress Notes (Signed)
NAME:  Brendan [name], MRN:  [id number], DOB:  01-Nov-1961, LOS: 6 ADMISSION DATE:  01/22/2020, CHIEF COMPLAINT:  Aphasia and weakness   Brief History   This is a 59 year old nursing home resident with 59 year old man with acute on chronic subdural hematoma.  On lovenox for RLE DVT. He was Sent to [facility] from SNF where he was felt to have worsening left sided weakness and aphasia starting at 1800 on 2/18 but had been noted to not be himself earlier that day.  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  He came to medical attention at his nursing home because of aphasia and weakness.  A CT scan of the head has shown acute on chronic subdural hematoma.  There is also some encephalomalacia on the left underlying the hematoma consequently there is no midline shift.   Also having worsening hematuria. Neurosurgery has evaluated the patient and is following. His lovenox was reversed with protamine on admission. PCCM contacted for admission.  Past Medical History  Otherwise remarkable for coronary disease diabetes history of DVT retroperitoneal fibrosis, prior CVA and IVC filter placement  Significant Hospital Events   2/18 >>admission  Consults:  Neurosurgery Nephrology  Procedures:  Left craniotomy for subdural hematoma evacuation 2/22 (Dr. Christella Noa) Percutaneous nephrostomy tube exchange 2/22 (Dr. Earleen Newport, IR) PICC 2/25 >>   Significant Diagnostic Tests:  2/21 CT Head > Some further enlargement of the subdural hematoma on the left. Overall volume increase estimated at about 1/3. Maximal thickness now measured at 2.5 cm compared with 2 cm previously. More hyperdense component. Summary distribution, including along the posterior falx and the tentorium. Increased mass effect with left-to-right shift of 7 mm today, increased from 4 mm previously.  2/20 KUB - no IVC filter in place  Micro Data:  COVID 2/19 > Negative  Urine cultures  2/19 > polymicrobial  Blood cultures 2/19 > negative MRSA PCR 2/19 > Positive   Antimicrobials:  Cefepime 2/19 >> 2/21  Interim history/subjective:  Overall no problems reported overnight.  He did accidentally lose IV access Tolerating dysphagia 2 diet, taking some pills  Objective   Blood pressure 123/67, pulse (!) 115, temperature 99.3 F (37.4 C), temperature source Axillary, resp. rate 18, height 6\' 1"  (1.854 m), weight 110.3 kg, SpO2 100 %.        Intake/Output Summary (Last 24 hours) at 01/29/2020 [id number] Last data filed at 01/29/2020 0700 Gross per 24 hour  Intake 2019.88 ml  Output 1901 ml  Net 118.88 ml   Filed Weights   01/22/20 2158 01/23/20 1500  Weight: 112 kg 110.3 kg    Examination: General: Chronically ill-appearing man, obese, laying in bed comfortably HEENT: Craniotomy staples on the left, incision clean.  His left periorbital edema is starting to resolve.  Now able to open his left eye Neuro: Awake, eyes open.  He tracks.  Attempts to interact and can be understood with phonation intermittently.  Appears to have some expressive aphasia.  Residual right-sided weakness CV: distant, regular, no M PULM: distant, clear B  GI: non-tender, non-distended, + BS GU: yellow urine in nephrostomy Extremities: 3+ right lower extremity edema, 1+ on the left lower extremity  Resolved Hospital Problem list    Fever - on admission - received 48 hours empiric cefepime. No evidence of pneumonia, UTI or positive blood cultures.  Assessment & Plan:  Brendan [name] is a 59 yo gentleman with a past medical history  of CKD, HTN and obstructive uropathy with left nephrostomy tube who lives in a SNF and presented on 2/19 with:  Acute on chronic subdural hematoma, post radiata me with a good activation on 2/22 P: Successful craniotomy and clot evacuation on 2/22.  Will discuss with Dr. Christella Noa today any indicated repeat imaging.  Also discuss the potential for restart anticoagulation.   If we do so then likely start with heparin infusion so it can be stopped if necessary. Continue neuro protective measures; goal for eurothermia, euglycemia, eunatermia, normoxia, and PCO2 goal of 35-40 Associated encephalopathy and dysphagia, now cleared for dysphagia 2 diet by SLP.  Supplementary tube feeding running Continue Keppra as ordered  Acute metabolic encephalopathy, improving -Secondary to acute on chronic subdural hematoma resulting in worsening lethargy and aphasia consistent with location of hematoma.  P: Off sedation Continue working with PT/OT  DVT right leg s/p treatment with enoxaparin due to Xarelto failure. -Vascular surgery attempted to place IVCF 2/9 with inability to place a inferior vena cava filter as the infrarenal IVC was occluded, hx retroperitoneal fibrosis P: May be able to restart anticoagulation, will review w Dr Christella Noa Reviewed films with Dr. Earleen Newport in IR.  Placement of an IVC filter not feasible.   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  Plan to flush as per protocol Follow BMP, urine output. With improvement in his renal function last 24 hours I think placement of a PICC line is reasonable Continue mycophenolate  Hypertension Labetalol, hydralazine ordered as needed  Multifactorial anemia due to acute/chronic blood loss and chronic renal disease.  P: Conservative transfusion protocol, goal Hgb 7.0 Follow CBC   Best practice:  Diet: Dysphagia 2, tube feeding Pain/Anxiety/Delirium protocol (if indicated): Not indicated VAP protocol (if indicated): Not currently indicated DVT prophylaxis: will placed SCD on the left side, right leg  has DVT.  GI prophylaxis: Pepcid ordered Glucose control: Sliding scale insulin ordered Mobility: Bedrest Code Status: Full Family: spoke w Mom 2/23 who wants pt's sister Elmo Putt to be main point of contact. Reviewed status with her on 2/23 Disposition: ICU.   Labs   CBC: Recent Labs  Lab 01/22/20 2213 01/23/20 0235 01/23/20 1900 01/24/20 0517 01/25/20 0504 01/26/20 0941 01/27/20 0543  WBC 9.7   < > 11.5* 10.8* 9.8 7.2 13.0*  NEUTROABS 7.9*  --  9.7*  --  7.9*  --   --   HGB 6.4*   < > 7.9* 8.1* 8.2* 8.2* 7.3*  HCT 22.2*   < > 26.5* 27.1* 28.1* 28.4* 25.4*  MCV 94.5   < > 91.4 92.2 93.7 95.0 96.2  PLT 376   < > 319 309 325 264 370   < > = values in this interval not displayed.    Basic Metabolic Panel: Recent Labs  Lab 01/25/20 0504 01/26/20 0941 01/27/20 0543 01/28/20 0529 01/29/20 0546  NA 146* 147* 148* 145 141  K 4.5 4.4 5.3* 4.8 5.2*  CL 115* 116* 120* 116* 114*  CO2 17* 19* 15* 18* 19*  GLUCOSE 140* 140* 143* 131* 136*  BUN 40* 37* 39* 47* 45*  CREATININE 2.54* 2.45* 2.67* 2.78* 2.28*  CALCIUM 8.8* 8.6* 8.6* 8.1* 7.8*  MG  --   --   --  2.3  --    GFR: Estimated Creatinine Clearance: 44.9 mL/min (A) (by C-G formula based on SCr of 2.28 mg/dL (H)). Recent Labs  Lab 01/23/20 1400 01/23/20 1645 01/23/20 1900 01/24/20 0517 01/24/20 0842 01/24/20 1031 01/25/20 0504 01/26/20 0941 01/27/20 0543  WBC 10.4  --    < > 10.8*  --   --  9.8 7.2 13.0*  LATICACIDVEN 1.0 2.6*  --   --  3.1* 1.9  --   --   --    < > = values in this interval not displayed.    Liver Function Tests: Recent Labs  Lab 01/22/20 2213 01/24/20 0517  AST 57* 48*  ALT 88* 76*  ALKPHOS 70 55  BILITOT 0.7 1.3*  PROT 7.6 7.2  ALBUMIN 3.4* 2.9*   No results for input(s): LIPASE, AMYLASE in the last 168 hours. No results for input(s): AMMONIA in the last 168 hours.  ABG    Component Value Date/Time   TCO2 25 01/13/2020 1007     Coagulation Profile: Recent Labs  Lab  01/22/20 2213 01/23/20 0235  INR 1.2 1.2    Cardiac Enzymes: No results for input(s): CKTOTAL, CKMB, CKMBINDEX, TROPONINI in the last 168 hours.  HbA1C: Hgb A1c MFr Bld  Date/Time Value Ref Range Status  01/24/2020 05:17 AM 5.0 4.8 - 5.6 % Final    Comment:    (NOTE) Pre diabetes:          5.7%-6.4% Diabetes:              >6.4% Glycemic control for   <7.0% adults with diabetes   01/23/2020 04:32 PM 5.2 4.8 - 5.6 % Final    Comment:    (NOTE) Pre diabetes:          5.7%-6.4% Diabetes:              >6.4% Glycemic control for   <7.0% adults with diabetes     CBG: Recent Labs  Lab 01/28/20 1522 01/28/20 1921 01/28/20 2259 01/29/20 0311 01/29/20 0741  GLUCAP 164* 150* 176* 146* 166*   CRITICAL CARE Performed by: Collene Gobble   Total critical care time: 32 minutes  Critical care time was exclusive of separately billable procedures and treating other patients.  Critical care was necessary to treat or prevent imminent or life-threatening deterioration.  Critical care was time spent personally by me on the following activities: development of treatment plan with patient and/or surrogate as well as nursing, discussions with consultants, evaluation of patient's response to treatment, examination of patient, obtaining history from patient or surrogate, ordering and performing treatments and interventions, ordering and review of laboratory studies, ordering and review of radiographic studies, pulse oximetry and re-evaluation of patient's condition.   Baltazar Apo, MD, PhD 01/29/2020, 8:22 AM [facility] [phone] or if no answer [phone]

## 2020-01-29 NOTE — Progress Notes (Signed)
Patient ID: Brendan [name], male   DOB: 1961/11/20, 59 y.o.   MRN: [id number] BP 94/61   Pulse (!) 103   Temp 99.1 F (37.3 C) (Axillary)   Resp 19   Ht 6\' 1"  (1.854 m)   Wt 109.2 kg   SpO2 100%   BMI 31.76 kg/m  Opening eyes to voice. Not as bright as yesterday, but moving all extremities.  Wound is clean, dry, without signs of infection Feeding tube in place Will obtain head CT to establish baseline for eventual start of anticoagulation.

## 2020-01-29 NOTE — Progress Notes (Signed)
Patient ID: Brendan [name], male   DOB: [birth year], 59 y.o.   MRN: [id number] BP 127/75   Pulse (!) 115   Temp 98.4 F (36.9 C) (Axillary)   Resp 20   Ht 6\' 1"  (1.854 m)   Wt 109.2 kg   SpO2 100%   BMI 31.76 kg/m  Scan reviewed. Significant improvement from preop. Still some acute blood present, along with air.  Left to right shift is present. Mild ventricular effacement noted. Basal cisterns widely patent. No evidence of herniation.

## 2020-01-30 DIAGNOSIS — I82511 Chronic embolism and thrombosis of right femoral vein: Secondary | ICD-10-CM

## 2020-01-30 LAB — CBC
HCT: 21.4 % — ABNORMAL LOW (ref 39.0–52.0)
Hemoglobin: 6 g/dL — CL (ref 13.0–17.0)
MCH: 27.5 pg (ref 26.0–34.0)
MCHC: 28 g/dL — ABNORMAL LOW (ref 30.0–36.0)
MCV: 98.2 fL (ref 80.0–100.0)
Platelets: 161 10*3/uL (ref 150–400)
RBC: 2.18 MIL/uL — ABNORMAL LOW (ref 4.22–5.81)
RDW: 18.9 % — ABNORMAL HIGH (ref 11.5–15.5)
WBC: 10.2 10*3/uL (ref 4.0–10.5)
nRBC: 1.3 % — ABNORMAL HIGH (ref 0.0–0.2)

## 2020-01-30 LAB — GLUCOSE, CAPILLARY
Glucose-Capillary: 193 mg/dL — ABNORMAL HIGH (ref 70–99)
Glucose-Capillary: 180 mg/dL — ABNORMAL HIGH (ref 70–99)
Glucose-Capillary: 227 mg/dL — ABNORMAL HIGH (ref 70–99)
Glucose-Capillary: 161 mg/dL — ABNORMAL HIGH (ref 70–99)
Glucose-Capillary: 369 mg/dL — ABNORMAL HIGH (ref 70–99)
Glucose-Capillary: 207 mg/dL — ABNORMAL HIGH (ref 70–99)

## 2020-01-30 LAB — MAGNESIUM: Magnesium: 2.3 mg/dL (ref 1.7–2.4)

## 2020-01-30 LAB — BASIC METABOLIC PANEL
Anion gap: 8 (ref 5–15)
BUN: 42 mg/dL — ABNORMAL HIGH (ref 6–20)
CO2: 17 mmol/L — ABNORMAL LOW (ref 22–32)
Calcium: 7.9 mg/dL — ABNORMAL LOW (ref 8.9–10.3)
Chloride: 111 mmol/L (ref 98–111)
Creatinine, Ser: 2.18 mg/dL — ABNORMAL HIGH (ref 0.61–1.24)
GFR calc Af Amer: 37 mL/min — ABNORMAL LOW (ref >60)
GFR calc non Af Amer: 32 mL/min — ABNORMAL LOW (ref >60)
Glucose, Bld: 192 mg/dL — ABNORMAL HIGH (ref 70–99)
Potassium: 5.8 mmol/L — ABNORMAL HIGH (ref 3.5–5.1)
Sodium: 136 mmol/L (ref 135–145)

## 2020-01-30 LAB — PREPARE RBC (CROSSMATCH)

## 2020-01-30 MED ORDER — SODIUM CHLORIDE 0.9% IV SOLUTION: Freq: Once | INTRAVENOUS | Status: AC

## 2020-01-30 MED ORDER — SODIUM ZIRCONIUM CYCLOSILICATE 10 G PO PACK
10.0000 g | PACK | Freq: Once | ORAL | Status: AC
  Administered 2020-01-30: 18:00:00 10 g via ORAL
  Filled 2020-01-30: qty 1

## 2020-01-30 MED ORDER — LEVETIRACETAM 100 MG/ML PO SOLN
500.0000 mg | Freq: Two times a day (BID) | ORAL | Status: DC
  Administered 2020-01-30 – 2020-02-04 (×10): 500 mg
  Filled 2020-01-30 (×10): qty 5

## 2020-01-30 MED ORDER — LEVETIRACETAM IN NACL 500 MG/100ML IV SOLN
500.0000 mg | Freq: Two times a day (BID) | INTRAVENOUS | Status: DC
  Filled 2020-01-30 (×3): qty 100

## 2020-01-30 NOTE — Progress Notes (Signed)
Physical Therapy Treatment Patient Details Name: Brendan [name] MRN: [id number] DOB: 1961/01/24 Today's Date: 01/30/2020    History of Present Illness 59 year old nursing home resident with DM, L CVA 2009, and CAD who is chronically anticoagulated with Lovenox due to the presence of a right lower extremity DVT despite the fact that he has a IVC filter in place.  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  He came to medical attention at his [facility] home because of aphasia and weakness.  A CT scan of the head has shown acute on chronic subdural hematoma., pt s/p evacuation of L SDH 01/26/20    PT Comments    Pt making good progress towards physical therapy goals today, requiring less assist with bed mobility and able to sit on edge of bed without physical assist. Also demonstrating active movement of RLE and able to achieve limited LAQ. Requiring two person maximal assist for stand pivot transfer from bed to recliner. Continue to recommend SNF for ongoing Physical Therapy.       Follow Up Recommendations  SNF;Supervision/Assistance - 24 hour     Equipment Recommendations  None recommended by PT    Recommendations for Other Services       Precautions / Restrictions Precautions Precautions: Fall;Other (comment) Precaution Comments: R hemiplegia, cortrak, nephrostomy drain Restrictions Weight Bearing Restrictions: No    Mobility  Bed Mobility Overal bed mobility: Needs Assistance Bed Mobility: Supine to Sit     Supine to sit: Mod assist;+2 for safety/equipment     General bed mobility comments: Pt initiating very well, with use of momentum and bed rail to progress to edge of bed, modA for trunk elevation to full upright position  Transfers Overall transfer level: Needs assistance Equipment used: None Transfers: Sit to/from [employer] Transfers Sit to Stand: Max assist;+2 physical  assistance Stand pivot transfers: Max assist;+2 physical assistance       General transfer comment: MaxA + 2 to stand from edge of bed and pivot towards left; right knee blocked but no buckle noted  Ambulation/Gait                 Stairs             Wheelchair Mobility    Modified Rankin (Stroke Patients Only) Modified Rankin (Stroke Patients Only) Pre-Morbid Rankin Score: Moderately severe disability Modified Rankin: Severe disability     Balance Overall balance assessment: Needs assistance Sitting-balance support: Single extremity supported;Feet supported Sitting balance-Leahy Scale: Fair Sitting balance - Comments: Supervision     Standing balance-Leahy Scale: Zero Standing balance comment: MaxA for upright                            Cognition Arousal/Alertness: Awake/alert Behavior During Therapy: Flat affect Overall Cognitive Status: Impaired/Different from baseline Area of Impairment: Following commands;Safety/judgement;Problem solving                       Following Commands: Follows one step commands inconsistently Safety/Judgement: Decreased awareness of safety;Decreased awareness of deficits Awareness: Intellectual   General Comments: Pt with good eye contact with therapist today, fairly consistent with one word responses but at times does not respond or is slow to respond. Does not typically verbalize unless asked a direct question; does better with yes/no responses. Improved initiation with motor tasks noted today.  Exercises General Exercises - Upper Extremity Shoulder Flexion: PROM;Right;10 reps;Supine Shoulder Extension: PROM;Right;10 reps;Supine Elbow Flexion: PROM;Right;10 reps;Supine Elbow Extension: PROM;Right;10 reps;Supine General Exercises - Lower Extremity Long Arc Quad: AROM;5 reps;Seated    General Comments        Pertinent Vitals/Pain Pain Assessment: Faces Faces Pain Scale: No hurt     Home Living                      Prior Function            PT Goals (current goals can now be found in the care plan section) Acute Rehab PT Goals Patient Stated Goal: unable to state Potential to Achieve Goals: Fair Progress towards PT goals: Progressing toward goals    Frequency    Min 3X/week      PT Plan Current plan remains appropriate    Co-evaluation              AM-PAC PT "6 Clicks" Mobility   Outcome Measure  Help needed turning from your back to your side while in a flat bed without using bedrails?: A Little Help needed moving from lying on your back to sitting on the side of a flat bed without using bedrails?: A Lot Help needed moving to and from a bed to a chair (including a wheelchair)?: Total Help needed standing up from a chair using your arms (e.g., wheelchair or bedside chair)?: Total Help needed to walk in hospital room?: Total Help needed climbing 3-5 steps with a railing? : Total 6 Click Score: 9    End of Session Equipment Utilized During Treatment: Gait belt Activity Tolerance: Patient tolerated treatment well Patient left: in chair;with call bell/phone within reach;with chair alarm set Nurse Communication: Mobility status PT Visit Diagnosis: Muscle weakness (generalized) (M62.81);Difficulty in walking, not elsewhere classified (R26.2)     Time: 1037-1100 PT Time Calculation (min) (ACUTE ONLY): 23 min  Charges:  $Therapeutic Exercise: 8-22 mins                       Wyona Almas, PT, DPT Acute Rehabilitation Services Pager [phone] Office [phone]    VERE PELLETT 01/30/2020, 1:49 PM

## 2020-01-30 NOTE — Progress Notes (Signed)
Patient ID: Brendan [name], male   DOB: [birth year], 59 y.o.   MRN: [id number] BP (!) 103/55   Pulse (!) 117   Temp 98.2 F (36.8 C) (Oral)   Resp 19   Ht 6\' 1"  (1.854 m)   Wt 109.2 kg   SpO2 100%   BMI 31.76 kg/m  Alert, following commands, conversant. Speech is slow, but it is clear. Unfortunately his Hgb is low, and he will be transfused. No obvious source of blood loss. Had planned on starting heparin, will hold for now.  Wound is clean, dry, no signs of infection.  Will follow

## 2020-01-30 NOTE — Progress Notes (Signed)
NAME:  Brendan [name], MRN:  [id number], DOB:  [birth year], LOS: 7 ADMISSION DATE:  01/22/2020, CHIEF COMPLAINT:  Aphasia and weakness   Brief History   This is a 59 year old nursing home resident with 59 year old man with acute on chronic subdural hematoma.  On lovenox for RLE DVT. He was Sent to [facility] from SNF where he was felt to have worsening left sided weakness and aphasia starting at 1800 on 2/18 but had been noted to not be himself earlier that day.  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  He came to medical attention at his nursing home because of aphasia and weakness.  A CT scan of the head has shown acute on chronic subdural hematoma.  There is also some encephalomalacia on the left underlying the hematoma consequently there is no midline shift.   Also having worsening hematuria. Neurosurgery has evaluated the patient and is following. His lovenox was reversed with protamine on admission. PCCM contacted for admission.  Past Medical History  Otherwise remarkable for coronary disease diabetes history of DVT retroperitoneal fibrosis, prior CVA and IVC filter placement  Significant Hospital Events   2/18 >>admission  Consults:  Neurosurgery Nephrology  Procedures:  Left craniotomy for subdural hematoma evacuation 2/22 (Dr. Christella Noa) Percutaneous nephrostomy tube exchange 2/22 (Dr. Earleen Newport, IR) Left upper extremity midline 2/25 >>   Significant Diagnostic Tests:  2/21 CT Head > Some further enlargement of the subdural hematoma on the left. Overall volume increase estimated at about 1/3. Maximal thickness now measured at 2.5 cm compared with 2 cm previously. More hyperdense component. Summary distribution, including along the posterior falx and the tentorium. Increased mass effect with left-to-right shift of 7 mm today, increased from 4 mm previously.  2/20 KUB - no IVC filter in place  2/25 head CT >> difficult  improvement in mass-effect, postoperative changes post left subdural hematoma evacuation.  Rightward shift 4 mm (improved).  Small volume subdural hemorrhage noted along the tentorium.  New small area left superior frontal gyrus hypoattenuation  Micro Data:  COVID 2/19 > Negative  Urine cultures 2/19 > polymicrobial  Blood cultures 2/19 > negative MRSA PCR 2/19 > Positive   Antimicrobials:  Cefepime 2/19 >> 2/21  Interim history/subjective:  No new problems reported overnight  Objective   Blood pressure 106/70, pulse (!) 110, temperature 98.3 F (36.8 C), temperature source Oral, resp. rate 14, height 6\' 1"  (1.854 m), weight 109.2 kg, SpO2 100 %.        Intake/Output Summary (Last 24 hours) at 01/30/2020 0806 Last data filed at 01/30/2020 0600 Gross per 24 hour  Intake 1981.63 ml  Output 2051 ml  Net -69.37 ml   Filed Weights   01/22/20 2158 01/23/20 1500 01/29/20 1200  Weight: 112 kg 110.3 kg 109.2 kg    Examination: General: Comfortable, laying in bed HEENT: Craniotomy staples on the left, incision clean.  His left periorbital edema is almost fully resolved Neuro: Awake, eyes open, tracks, more easily understood today, phonating.  Memory of hospital events is poor.  Able to move left upper extremity, residual weakness right upper extremity CV: Distant, regular no murmur PULM: Stick, clear bilaterally GI: Nondistended, positive bowel sounds GU: Yellow urine in left nephrostomy bag and Foley bag Extremities: 3+ right lower extremity edema, 1+ left lower extremity edema  Resolved Hospital Problem list    Fever - on admission - received 48 hours empiric cefepime. No evidence  of pneumonia, UTI or positive blood cultures.  Assessment & Plan:  Brendan [name] is a 59 yo gentleman with a past medical history of CKD, HTN and obstructive uropathy with left nephrostomy tube who lives in a SNF and presented on 2/19 with:  Acute on chronic subdural hematoma, post radiata me with a good  activation on 2/22 P: Appreciate Dr. Hewitt Shorts management.  Successful craniotomy and clot evacuation 2/22, repeat CT head 2/26 reassuring.  Will plan to restart heparin infusion when cleared to do so by neurosurgery.  I would like to leave him in the ICU so that he can be closely monitored when we initiate. Maintain neuroprotective measures: Temperature, glucose, sodium, oxygenation.  Goal PCO2 35-40. Associated dysphagia, improving.  Appreciate SLP management Continue Keppra as ordered  Acute metabolic encephalopathy, improving -Secondary to acute on chronic subdural hematoma resulting in worsening lethargy and aphasia consistent with location of hematoma.  P: Sedation off Continue working with PT/OT  DVT right leg s/p treatment with enoxaparin due to Xarelto failure. 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 Earleen Newport in IR.  No role for reattempt IVC placement.  Clot and retroperitoneal fibrotic change will not allow P: Restart anticoagulation when okay with Dr. Christella Noa.  Plan to use heparin infusion without a bolus.  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 Follow BMP, urine output Continue baseline mycophenolate Midline catheter placed on 2/25  Hypertension Labetalol, hydralazine ordered as needed  Multifactorial anemia due to acute/chronic blood loss and chronic renal disease.  P: Continue conservative transfusion protocol, goal Hgb 7.0 Follow CBC   Best practice:  Diet: Dysphagia 2, tube feeding Pain/Anxiety/Delirium protocol  (if indicated): Not indicated VAP protocol (if indicated): Not currently indicated DVT prophylaxis: will placed SCD on the left side, right leg has DVT.  GI prophylaxis: Pepcid ordered Glucose control: Sliding scale insulin ordered Mobility: Bedrest Code Status: Full Family: spoke w Mom 2/23 who wants pt's sister Elmo Putt to be main point of contact. Reviewed status with Elmo Putt on [id number] Disposition: ICU.   Labs   CBC: Recent Labs  Lab 01/23/20 1900 01/24/20 0517 01/25/20 0504 01/26/20 0941 01/27/20 0543  WBC 11.5* 10.8* 9.8 7.2 13.0*  NEUTROABS 9.7*  --  7.9*  --   --   HGB 7.9* 8.1* 8.2* 8.2* 7.3*  HCT 26.5* 27.1* 28.1* 28.4* 25.4*  MCV 91.4 92.2 93.7 95.0 96.2  PLT 319 309 325 264 0000000    Basic Metabolic Panel: Recent Labs  Lab 01/25/20 0504 01/26/20 0941 01/27/20 0543 01/28/20 0529 01/29/20 0546  NA 146* 147* 148* 145 141  K 4.5 4.4 5.3* 4.8 5.2*  CL 115* 116* 120* 116* 114*  CO2 17* 19* 15* 18* 19*  GLUCOSE 140* 140* 143* 131* 136*  BUN 40* 37* 39* 47* 45*  CREATININE 2.54* 2.45* 2.67* 2.78* 2.28*  CALCIUM 8.8* 8.6* 8.6* 8.1* 7.8*  MG  --   --   --  2.3  --    GFR: Estimated Creatinine Clearance: 44.6 mL/min (A) (by C-G formula based on SCr of 2.28 mg/dL (H)). Recent Labs  Lab 01/23/20 1400 01/23/20 1645 01/23/20  1900 01/24/20 0517 01/24/20 0842 01/24/20 1031 01/25/20 0504 01/26/20 0941 01/27/20 0543  WBC 10.4  --    < > 10.8*  --   --  9.8 7.2 13.0*  LATICACIDVEN 1.0 2.6*  --   --  3.1* 1.9  --   --   --    < > = values in this interval not displayed.    Liver Function Tests: Recent Labs  Lab 01/24/20 0517  AST 48*  ALT 76*  ALKPHOS 55  BILITOT 1.3*  PROT 7.2  ALBUMIN 2.9*   No results for input(s): LIPASE, AMYLASE in the last 168 hours. No results for input(s): AMMONIA in the last 168 hours.  ABG    Component Value Date/Time   TCO2 25 01/13/2020 1007     Coagulation Profile: No results for input(s): INR, PROTIME in the last 168  hours.  Cardiac Enzymes: No results for input(s): CKTOTAL, CKMB, CKMBINDEX, TROPONINI in the last 168 hours.  HbA1C: Hgb A1c MFr Bld  Date/Time Value Ref Range Status  01/24/2020 05:17 AM 5.0 4.8 - 5.6 % Final    Comment:    (NOTE) Pre diabetes:          5.7%-6.4% Diabetes:              >6.4% Glycemic control for   <7.0% adults with diabetes   01/23/2020 04:32 PM 5.2 4.8 - 5.6 % Final    Comment:    (NOTE) Pre diabetes:          5.7%-6.4% Diabetes:              >6.4% Glycemic control for   <7.0% adults with diabetes     CBG: Recent Labs  Lab 01/29/20 1143 01/29/20 1556 01/29/20 1913 01/29/20 2318 01/30/20 0407  GLUCAP 104* 159* 184* 192* 193*   CRITICAL CARE Performed by: Collene Gobble   Total critical care time: 31 minutes  Critical care time was exclusive of separately billable procedures and treating other patients.  Critical care was necessary to treat or prevent imminent or life-threatening deterioration.  Critical care was time spent personally by me on the following activities: development of treatment plan with patient and/or surrogate as well as nursing, discussions with consultants, evaluation of patient's response to treatment, examination of patient, obtaining history from patient or surrogate, ordering and performing treatments and interventions, ordering and review of laboratory studies, ordering and review of radiographic studies, pulse oximetry and re-evaluation of patient's condition.   Baltazar Apo, MD, PhD 01/30/2020, 8:06 AM [facility] [phone] or if no answer [phone]

## 2020-01-31 DIAGNOSIS — Z95828 Presence of other vascular implants and grafts: Secondary | ICD-10-CM

## 2020-01-31 DIAGNOSIS — Z7901 Long term (current) use of anticoagulants: Secondary | ICD-10-CM

## 2020-01-31 DIAGNOSIS — I8290 Acute embolism and thrombosis of unspecified vein: Secondary | ICD-10-CM

## 2020-01-31 LAB — CBC
HCT: 28.2 % — ABNORMAL LOW (ref 39.0–52.0)
Hemoglobin: 8.4 g/dL — ABNORMAL LOW (ref 13.0–17.0)
MCH: 28.1 pg (ref 26.0–34.0)
MCHC: 29.8 g/dL — ABNORMAL LOW (ref 30.0–36.0)
MCV: 94.3 fL (ref 80.0–100.0)
Platelets: 249 10*3/uL (ref 150–400)
RBC: 2.99 MIL/uL — ABNORMAL LOW (ref 4.22–5.81)
RDW: 17.4 % — ABNORMAL HIGH (ref 11.5–15.5)
WBC: 8.3 10*3/uL (ref 4.0–10.5)
nRBC: 1.4 % — ABNORMAL HIGH (ref 0.0–0.2)

## 2020-01-31 LAB — TYPE AND SCREEN
ABO/RH(D): A POS
Antibody Screen: NEGATIVE
Unit division: 0
Unit division: 0

## 2020-01-31 LAB — BASIC METABOLIC PANEL
Anion gap: 7 (ref 5–15)
BUN: 39 mg/dL — ABNORMAL HIGH (ref 6–20)
CO2: 19 mmol/L — ABNORMAL LOW (ref 22–32)
Calcium: 8.2 mg/dL — ABNORMAL LOW (ref 8.9–10.3)
Chloride: 112 mmol/L — ABNORMAL HIGH (ref 98–111)
Creatinine, Ser: 1.98 mg/dL — ABNORMAL HIGH (ref 0.61–1.24)
GFR calc Af Amer: 41 mL/min — ABNORMAL LOW (ref >60)
GFR calc non Af Amer: 36 mL/min — ABNORMAL LOW (ref >60)
Glucose, Bld: 212 mg/dL — ABNORMAL HIGH (ref 70–99)
Potassium: 5.8 mmol/L — ABNORMAL HIGH (ref 3.5–5.1)
Sodium: 138 mmol/L (ref 135–145)

## 2020-01-31 LAB — GLUCOSE, CAPILLARY
Glucose-Capillary: 214 mg/dL — ABNORMAL HIGH (ref 70–99)
Glucose-Capillary: 170 mg/dL — ABNORMAL HIGH (ref 70–99)
Glucose-Capillary: 196 mg/dL — ABNORMAL HIGH (ref 70–99)
Glucose-Capillary: 201 mg/dL — ABNORMAL HIGH (ref 70–99)
Glucose-Capillary: 335 mg/dL — ABNORMAL HIGH (ref 70–99)

## 2020-01-31 LAB — MAGNESIUM: Magnesium: 2.1 mg/dL (ref 1.7–2.4)

## 2020-01-31 LAB — BPAM RBC
Blood Product Expiration Date: 202103192359
Blood Product Expiration Date: 202103212359
ISSUE DATE / TIME: 202102261758
ISSUE DATE / TIME: 202102261950
Unit Type and Rh: 6200
Unit Type and Rh: 6200

## 2020-01-31 MED ORDER — INSULIN ASPART 100 UNIT/ML ~~LOC~~ SOLN
0.0000 [IU] | SUBCUTANEOUS | Status: DC
  Administered 2020-01-31: 20:00:00 11 [IU] via SUBCUTANEOUS
  Administered 2020-01-31: 16:00:00 5 [IU] via SUBCUTANEOUS

## 2020-01-31 MED ORDER — INSULIN ASPART 100 UNIT/ML ~~LOC~~ SOLN
3.0000 [IU] | SUBCUTANEOUS | Status: DC
  Administered 2020-01-31 (×2): 3 [IU] via SUBCUTANEOUS

## 2020-01-31 MED ORDER — SODIUM ZIRCONIUM CYCLOSILICATE 10 G PO PACK
10.0000 g | PACK | Freq: Once | ORAL | Status: AC
  Administered 2020-01-31: 09:00:00 10 g via ORAL
  Filled 2020-01-31: qty 1

## 2020-01-31 MED ORDER — INSULIN DETEMIR 100 UNIT/ML ~~LOC~~ SOLN
0.1500 [IU]/kg | Freq: Two times a day (BID) | SUBCUTANEOUS | Status: DC
  Administered 2020-01-31 – 2020-02-01 (×2): 16 [IU] via SUBCUTANEOUS
  Filled 2020-01-31 (×3): qty 0.16

## 2020-01-31 MED ORDER — INSULIN ASPART 100 UNIT/ML ~~LOC~~ SOLN
3.0000 [IU] | SUBCUTANEOUS | Status: DC
  Administered 2020-02-01 – 2020-02-02 (×7): 9 [IU] via SUBCUTANEOUS
  Administered 2020-02-02: 6 [IU] via SUBCUTANEOUS
  Administered 2020-02-02 (×2): 9 [IU] via SUBCUTANEOUS

## 2020-01-31 NOTE — Progress Notes (Signed)
NAME:  Brendan [name], MRN:  [id number], DOB:  1961-08-24, LOS: 8 ADMISSION DATE:  01/22/2020, CHIEF COMPLAINT:  Aphasia and weakness   Brief History   This is a 59 year old nursing home resident with 59 year old man with acute on chronic subdural hematoma.  On lovenox for RLE DVT. He was Sent to [facility] from SNF where he was felt to have worsening left sided weakness and aphasia starting at 1800 on 2/18 but had been noted to not be himself earlier that day.  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  He came to medical attention at his nursing home because of aphasia and weakness.  A CT scan of the head has shown acute on chronic subdural hematoma.  There is also some encephalomalacia on the left underlying the hematoma consequently there is no midline shift.   Also having worsening hematuria. Neurosurgery has evaluated the patient and is following. His lovenox was reversed with protamine on admission. PCCM contacted for admission.  Past Medical History  Otherwise remarkable for coronary disease diabetes history of DVT retroperitoneal fibrosis, prior CVA and IVC filter placement  Significant Hospital Events   2/18 >>admission  Consults:  Neurosurgery Nephrology  Procedures:  Left craniotomy for subdural hematoma evacuation 2/22 (Dr. Christella Noa) Percutaneous nephrostomy tube exchange 2/22 (Dr. Earleen Newport, IR) Left upper extremity midline 2/25 >>  Significant Diagnostic Tests:  2/21 CT Head > Some further enlargement of the subdural hematoma on the left. Overall volume increase estimated at about 1/3. Maximal thickness now measured at 2.5 cm compared with 2 cm previously. More hyperdense component. Summary distribution, including along the posterior falx and the tentorium. Increased mass effect with left-to-right shift of 7 mm today, increased from 4 mm previously.  2/20 KUB > no IVC filter in place  2/25 head CT >> difficult  improvement in mass-effect, postoperative changes post left subdural hematoma evacuation.  Rightward shift 4 mm (improved).  Small volume subdural hemorrhage noted along the tentorium.  New small area left superior frontal gyrus hypoattenuation  Micro Data:  COVID 2/19 > Negative  Urine cultures 2/19 > polymicrobial  Blood cultures 2/19 > negative MRSA PCR 2/19 > Positive   Antimicrobials:  Cefepime 2/19 >> 2/21  Interim history/subjective:  RN reports no acute events overnight, good urine output.  When asked if he is experiencing any pain says yes that is unable to articulate location.    Objective   Blood pressure 125/79, pulse 95, temperature 99.6 F (37.6 C), temperature source Axillary, resp. rate 19, height 6\' 1"  (1.854 m), weight 109.2 kg, SpO2 100 %.        Intake/Output Summary (Last 24 hours) at 01/31/2020 0910 Last data filed at 01/31/2020 0827 Gross per 24 hour  Intake 1706.22 ml  Output 2776 ml  Net -1069.78 ml   Filed Weights   01/22/20 2158 01/23/20 1500 01/29/20 1200  Weight: 112 kg 110.3 kg 109.2 kg    Examination: General: Chronically ill appearing elderly male lying in bed, in NAD HEENT: [place]/AT, MM pink/moist, PERRL, core track to place Neuro: Alert and oriented x1, fluctuating mentation, very slow response to questions, able to follow simple commands.  Paralysis to right upper and lower extremity CV: s1s2 regular rate and rhythm, no murmur, rubs, or gallops,  PULM: Clear to auscultation bilaterally, no added breath sounds, no increased work of breathing GI: soft, bowel sounds active in all 4 quadrants, non-tender, non-distended, tolerating TF Extremities:  warm/dry, 3+ pitting edema right lower extremity, 1+ pitting edema left lower extremity  Skin: no rashes or lesions  Resolved Hospital Problem list   Fever - on admission - received 48 hours empiric cefepime. No evidence of pneumonia, UTI or positive blood cultures.  Assessment & Plan:  Brendan [name] is a  59 yo gentleman with a past medical history of CKD, HTN and obstructive uropathy with left nephrostomy tube who lives in a SNF and presented on 2/19 with:  Acute on chronic subdural hematoma, post radiata me with a good activation on 2/22 -Successful craniotomy and clot evacuation 2/22, repeat CT head 2/26 reassuring. P: Neurology and neurosurgery following, appreciate assistance Drop in hemoglobin 2/25 primary resumption of anticoagulation Coordinate with neurosurgery prior to resumption of anticoagulation Will need ICU monitoring once anticoagulation resumed Maintain neuro protective measures; eurothermia, euglycemia, eunatermia, normoxia Nutrition and bowel regiment  Seizure precautions  Keppra per neurology SLP management for associated dysphagia  Acute metabolic encephalopathy, improving -Secondary to acute on chronic subdural hematoma resulting in worsening lethargy and aphasia consistent with location of hematoma.  P: Minimal to no sedation Continue PT/OT efforts Delirium precautions Mobilize as able  DVT right leg s/p treatment with enoxaparin due to Xarelto failure. 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 Earleen Newport in IR.  No role for reattempt IVC placement.  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  P: Follow CBC Transfuse per protocol Hemoglobin goal greater than 7  Hyperkalemia P: Remains elevated at 5.8 as of 2/27 [place] today Trend agreement  Best practice:  Diet: Dysphagia 2, tube feeding Pain/Anxiety/Delirium protocol (if indicated): Not indicated VAP protocol (if indicated): Not currently indicated DVT prophylaxis: will placed SCD on the left side, right leg has DVT.  GI prophylaxis: Pepcid ordered Glucose control: Sliding scale insulin ordered Mobility: Bedrest Code Status: Full Family: spoke w Mom 2/23 who wants pt's sister Elmo Putt to be main point of contact.  We will again review status with Elmo Putt later today [id number] Disposition: ICU.   Labs   CBC: Recent Labs  Lab 01/25/20 0504 01/26/20 0941 01/27/20 0543 01/30/20 1321 01/31/20 0531  WBC 9.8 7.2 13.0* 10.2 8.3  NEUTROABS 7.9*  --   --   --   --   HGB 8.2* 8.2* 7.3* 6.0* 8.4*  HCT 28.1* 28.4* 25.4* 21.4* 28.2*  MCV 93.7 95.0 96.2 98.2 94.3  PLT 325 264 370 161 0000000    Basic Metabolic Panel: Recent Labs  Lab 01/27/20 0543 01/28/20 0529 01/29/20 0546 01/30/20 1321 01/31/20 0531  NA 148* 145 141 136 138  K 5.3* 4.8 5.2* 5.8* 5.8*  CL 120* 116* 114* 111 112*  CO2 15* 18* 19* 17* 19*  GLUCOSE 143* 131* 136* 192* 212*  BUN 39* 47* 45* 42* 39*  CREATININE 2.67* 2.78* 2.28* 2.18* 1.98*  CALCIUM 8.6* 8.1* 7.8* 7.9* 8.2*  MG  --  2.3  --  2.3 2.1   GFR: Estimated Creatinine Clearance: 51.4 mL/min (A) (by C-G formula based on SCr of 1.98 mg/dL (H)). Recent Labs  Lab 01/24/20 1031 01/25/20 0504 01/26/20 0941 01/27/20 0543 01/30/20 1321 01/31/20 0531  WBC  --    < > 7.2 13.0* 10.2 8.3  LATICACIDVEN 1.9  --   --   --   --   --    < > = values in this interval not displayed.    Liver Function Tests: No  results for input(s): AST, ALT, ALKPHOS, BILITOT, PROT, ALBUMIN in the last 168 hours. No results for input(s): LIPASE, AMYLASE in the last 168 hours. No results for input(s): AMMONIA in the last 168 hours.  ABG    Component Value Date/Time   TCO2 25 01/13/2020 1007     Coagulation Profile: No results for input(s): INR, PROTIME in the last 168 hours.  Cardiac Enzymes: No results for input(s): CKTOTAL, CKMB, CKMBINDEX, TROPONINI in the last 168 hours.  HbA1C: Hgb A1c MFr Bld  Date/Time Value Ref Range Status  01/24/2020 05:17 AM 5.0 4.8 - 5.6 % Final    Comment:    (NOTE) Pre diabetes:          5.7%-6.4% Diabetes:              >6.4% Glycemic control for   <7.0% adults with diabetes   01/23/2020 04:32 PM 5.2 4.8 - 5.6 % Final    Comment:    (NOTE) Pre diabetes:          5.7%-6.4% Diabetes:              >6.4% Glycemic control for   <7.0% adults with diabetes     CBG: Recent Labs  Lab 01/30/20 1616 01/30/20 1912 01/30/20 2317 01/31/20 0305 01/31/20 0722  GLUCAP 161* 369* 207* 214* 170*   Signature Johnsie Cancel, NP-C [facility] Contact / Pager information can be found on Amion  01/31/2020, 9:20 AM

## 2020-01-31 NOTE — Progress Notes (Signed)
[place] Progress Note [name] DOB: 08/31/1961 MRN: [id number]   Date of Service  01/31/2020  HPI/Events of Note  Sub-optimal glycemic control  eICU Interventions  Levemir 8 units SQ  Bid added.        Brendan [name] [name] 01/31/2020, 10:20 PM

## 2020-01-31 NOTE — Progress Notes (Signed)
Neurosurgery Service Progress Note  Subjective: No acute events overnight.    Objective: Vitals:   01/31/20 0500 01/31/20 0600 01/31/20 0700 01/31/20 0800  BP: 128/81 (!) 126/95 (!) 134/117 125/79  Pulse: (!) 104 (!) 103 89 95  Resp: (!) 21 17 19 19   Temp:    99.6 F (37.6 C)  TempSrc:    Axillary  SpO2: 100% 100% 100% 100%  Weight:      Height:       Temp (24hrs), Avg:98.6 F (37 C), Min:98.2 F (36.8 C), Max:99.6 F (37.6 C)  CBC Latest Ref Rng & Units 01/31/2020 01/30/2020 01/27/2020  WBC 4.0 - 10.5 K/uL 8.3 10.2 13.0(H)  Hemoglobin 13.0 - 17.0 g/dL 8.4(L) 6.0(LL) 7.3(L)  Hematocrit 39.0 - 52.0 % 28.2(L) 21.4(L) 25.4(L)  Platelets 150 - 400 K/uL 249 161 370   BMP Latest Ref Rng & Units 01/31/2020 01/30/2020 01/29/2020  Glucose 70 - 99 mg/dL 212(H) 192(H) 136(H)  BUN 6 - 20 mg/dL 39(H) 42(H) 45(H)  Creatinine 0.61 - 1.24 mg/dL 1.98(H) 2.18(H) 2.28(H)  Sodium 135 - 145 mmol/L 138 136 141  Potassium 3.5 - 5.1 mmol/L 5.8(H) 5.8(H) 5.2(H)  Chloride 98 - 111 mmol/L 112(H) 111 114(H)  CO2 22 - 32 mmol/L 19(L) 17(L) 19(L)  Calcium 8.9 - 10.3 mg/dL 8.2(L) 7.9(L) 7.8(L)    Intake/Output Summary (Last 24 hours) at 01/31/2020 0932 Last data filed at 01/31/2020 0827 Gross per 24 hour  Intake 1606.22 ml  Output 2625 ml  Net -1018.78 ml    Current Facility-Administered Medications:  .  0.9 %  sodium chloride infusion, , Intravenous, Continuous, Cabbell, Kyle, MD, Last Rate: 10 mL/hr at 01/31/20 0600, Rate Verify at 01/31/20 0600 .  Place/Maintain arterial line, , , Until Discontinued **AND** 0.9 %  sodium chloride infusion, , Intra-arterial, PRN, Ashok Pall, MD .  acetaminophen (TYLENOL) 160 MG/5ML solution 325 mg, 325 mg, Oral, Q4H PRN, Ashok Pall, MD .  chlorhexidine (PERIDEX) 0.12 % solution 15 mL, 15 mL, Mouth Rinse, BID, Byrum, Rose Fillers, MD, 15 mL at 01/30/20 2220 .  Chlorhexidine Gluconate Cloth 2 % PADS 6 each, 6 each, Topical, Daily, Ashok Pall, MD, 6 each at  01/30/20 [phone] .  docusate (COLACE) 50 MG/5ML liquid 50 mg, 50 mg, Per Tube, Daily, Byrum, Rose Fillers, MD, Stopped at 01/30/20 [phone]  famotidine (PEPCID) tablet 20 mg, 20 mg, Per Tube, QHS, Collene Gobble, MD, 20 mg at 01/30/20 2220 .  feeding supplement (OSMOLITE 1.5 CAL) liquid 1,000 mL, 1,000 mL, Per Tube, Continuous, Byrum, Rose Fillers, MD, Last Rate: 60 mL/hr at 01/31/20 0000, 1,000 mL at 01/31/20 0000 .  feeding supplement (PRO-STAT SUGAR FREE 64) liquid 30 mL, 30 mL, Per Tube, BID, Byrum, Rose Fillers, MD, 30 mL at 01/30/20 2220 .  hydrALAZINE (APRESOLINE) injection 10 mg, 10 mg, Intravenous, Q4H PRN, Ashok Pall, MD .  insulin aspart (novoLOG) injection 0-6 Units, 0-6 Units, Subcutaneous, Q4H, Ashok Pall, MD, 1 Units at 01/31/20 0820 .  labetalol (NORMODYNE) injection 5-20 mg, 5-20 mg, Intravenous, Q30 min PRN, Ashok Pall, MD, 20 mg at 01/27/20 [id number] .  levETIRAcetam (KEPPRA) IVPB 500 mg/100 mL premix, 500 mg, Intravenous, Q12H **OR** levETIRAcetam (KEPPRA) 100 MG/ML solution 500 mg, 500 mg, Per Tube, Q12H, Collene Gobble, MD, 500 mg at 01/30/20 2219 .  lidocaine (PF) (XYLOCAINE) 1 % injection, , Infiltration, PRN, Corrie Mckusick, DO, 10 mL at 01/26/20 1204 .  MEDLINE mouth rinse, 15 mL, Mouth Rinse, BID, Ashok Pall, MD, 15  mL at 01/30/20 2221 .  morphine 2 MG/ML injection 2-6 mg, 2-6 mg, Intravenous, Q3H PRN, Ashok Pall, MD, 2 mg at 01/27/20 0308 .  mycophenolate (CELLCEPT) oral suspension 50 mg/mL, 500 mg, Per Tube, BID, Collene Gobble, MD, 500 mg at 01/30/20 2220 .  oxyCODONE (Oxy IR/ROXICODONE) immediate release tablet 5 mg, 5 mg, Per Tube, Q8H PRN, Byrum, Rose Fillers, MD .  polyethylene glycol (MIRALAX / GLYCOLAX) packet 17 g, 17 g, Oral, Daily PRN, Ashok Pall, MD .  sodium chloride flush (NS) 0.9 % injection 10-40 mL, 10-40 mL, Intracatheter, Q12H, Collene Gobble, MD, 10 mL at 01/30/20 2221 .  sodium chloride flush (NS) 0.9 % injection 10-40 mL, 10-40 mL, Intracatheter, PRN,  Byrum, Rose Fillers, MD   Physical Exam: AOx1, FC x4 but minimal movement on R w/ inc'd tone, RLE externally rotated  Assessment & Plan: 59 y.o. man w/ SDH s/p evacuation, also has RLE DVT.  -will d/w CCM regarding heparin restart timing. From vascular's operative note, it sounds like he has an occluded IVC, so I would presume that the risk of PE would be significantly reduced. Given his residual subdural and recent drop in Hb along with the occluded IVC, I think we should hold off on restarting anticoagulation.  Judith Part  01/31/20 9:32 AM

## 2020-02-01 LAB — CBC
HCT: 29.3 % — ABNORMAL LOW (ref 39.0–52.0)
Hemoglobin: 8.6 g/dL — ABNORMAL LOW (ref 13.0–17.0)
MCH: 28.1 pg (ref 26.0–34.0)
MCHC: 29.4 g/dL — ABNORMAL LOW (ref 30.0–36.0)
MCV: 95.8 fL (ref 80.0–100.0)
Platelets: 214 10*3/uL (ref 150–400)
RBC: 3.06 MIL/uL — ABNORMAL LOW (ref 4.22–5.81)
RDW: 17.3 % — ABNORMAL HIGH (ref 11.5–15.5)
WBC: 8.2 10*3/uL (ref 4.0–10.5)
nRBC: 0.7 % — ABNORMAL HIGH (ref 0.0–0.2)

## 2020-02-01 LAB — BASIC METABOLIC PANEL
Anion gap: 6 (ref 5–15)
BUN: 30 mg/dL — ABNORMAL HIGH (ref 6–20)
CO2: 21 mmol/L — ABNORMAL LOW (ref 22–32)
Calcium: 8.7 mg/dL — ABNORMAL LOW (ref 8.9–10.3)
Chloride: 108 mmol/L (ref 98–111)
Creatinine, Ser: 1.88 mg/dL — ABNORMAL HIGH (ref 0.61–1.24)
GFR calc Af Amer: 44 mL/min — ABNORMAL LOW (ref >60)
GFR calc non Af Amer: 38 mL/min — ABNORMAL LOW (ref >60)
Glucose, Bld: 254 mg/dL — ABNORMAL HIGH (ref 70–99)
Potassium: 5.8 mmol/L — ABNORMAL HIGH (ref 3.5–5.1)
Sodium: 135 mmol/L (ref 135–145)

## 2020-02-01 LAB — GLUCOSE, CAPILLARY
Glucose-Capillary: 223 mg/dL — ABNORMAL HIGH (ref 70–99)
Glucose-Capillary: 236 mg/dL — ABNORMAL HIGH (ref 70–99)
Glucose-Capillary: 239 mg/dL — ABNORMAL HIGH (ref 70–99)
Glucose-Capillary: 240 mg/dL — ABNORMAL HIGH (ref 70–99)
Glucose-Capillary: 242 mg/dL — ABNORMAL HIGH (ref 70–99)
Glucose-Capillary: 254 mg/dL — ABNORMAL HIGH (ref 70–99)
Glucose-Capillary: 261 mg/dL — ABNORMAL HIGH (ref 70–99)
Glucose-Capillary: 262 mg/dL — ABNORMAL HIGH (ref 70–99)

## 2020-02-01 MED ORDER — INSULIN ASPART 100 UNIT/ML IV SOLN
3.0000 [IU] | Freq: Once | INTRAVENOUS | Status: AC
  Administered 2020-02-01: 3 [IU] via INTRAVENOUS

## 2020-02-01 MED ORDER — INSULIN ASPART 100 UNIT/ML ~~LOC~~ SOLN
4.0000 [IU] | SUBCUTANEOUS | Status: DC
  Administered 2020-02-01 (×3): 4 [IU] via SUBCUTANEOUS

## 2020-02-01 MED ORDER — INSULIN ASPART 100 UNIT/ML ~~LOC~~ SOLN
6.0000 [IU] | SUBCUTANEOUS | Status: DC
  Administered 2020-02-01 – 2020-02-02 (×7): 6 [IU] via SUBCUTANEOUS

## 2020-02-01 MED ORDER — INSULIN DETEMIR 100 UNIT/ML ~~LOC~~ SOLN
20.0000 [IU] | Freq: Two times a day (BID) | SUBCUTANEOUS | Status: DC
  Administered 2020-02-01: 20 [IU] via SUBCUTANEOUS
  Filled 2020-02-01 (×3): qty 0.2

## 2020-02-01 NOTE — Progress Notes (Signed)
[place] Progress Note [name] DOB: 01-21-1961 MRN: [id number]   Date of Service  02/01/2020  HPI/Events of Note  Blood sugar 212 mg %  eICU Interventions  Novolog 3 units iv x 1, continue with Pt's scheduled Insulin regimen.        Kerry Kass [name] 02/01/2020, 6:21 AM

## 2020-02-01 NOTE — Progress Notes (Signed)
[place] Progress Note [name] DOB: 05-07-1961 MRN: [id number]   Date of Service  02/01/2020  HPI/Events of Note  Blood sugar improved but still high following first dose of Levemir  eICU Interventions  Will continue to monitor for now as the full effect of the Levemir requires more than one dose.        Kerry Kass Cintya Daughety 02/01/2020, 4:46 AM

## 2020-02-01 NOTE — Progress Notes (Signed)
Assisted tele visit to patient with family member.  Jowel Waltner P, RN  

## 2020-02-01 NOTE — Progress Notes (Signed)
Neurosurgery Service Progress Note  Subjective: No acute events overnight.    Objective: Vitals:   02/01/20 0500 02/01/20 0505 02/01/20 0600 02/01/20 0700  BP: (!) 148/73 (!) 122/93 112/78 129/84  Pulse: (!) 102 96 (!) 101 92  Resp: 16 19 20 19   Temp:      TempSrc:      SpO2: 100% 100% 100% 100%  Weight:      Height:       Temp (24hrs), Avg:99.1 F (37.3 C), Min:98.7 F (37.1 C), Max:99.6 F (37.6 C)  CBC Latest Ref Rng & Units 01/31/2020 01/30/2020 01/27/2020  WBC 4.0 - 10.5 K/uL 8.3 10.2 13.0(H)  Hemoglobin 13.0 - 17.0 g/dL 8.4(L) 6.0(LL) 7.3(L)  Hematocrit 39.0 - 52.0 % 28.2(L) 21.4(L) 25.4(L)  Platelets 150 - 400 K/uL 249 161 370   BMP Latest Ref Rng & Units 02/01/2020 01/31/2020 01/30/2020  Glucose 70 - 99 mg/dL 254(H) 212(H) 192(H)  BUN 6 - 20 mg/dL 30(H) 39(H) 42(H)  Creatinine 0.61 - 1.24 mg/dL 1.88(H) 1.98(H) 2.18(H)  Sodium 135 - 145 mmol/L 135 138 136  Potassium 3.5 - 5.1 mmol/L 5.8(H) 5.8(H) 5.8(H)  Chloride 98 - 111 mmol/L 108 112(H) 111  CO2 22 - 32 mmol/L 21(L) 19(L) 17(L)  Calcium 8.9 - 10.3 mg/dL 8.7(L) 8.2(L) 7.9(L)    Intake/Output Summary (Last 24 hours) at 02/01/2020 0842 Last data filed at 02/01/2020 0800 Gross per 24 hour  Intake 1959.97 ml  Output 3000 ml  Net -1040.03 ml    Current Facility-Administered Medications:  .  0.9 %  sodium chloride infusion, , Intravenous, Continuous, Cabbell, Kyle, MD, Last Rate: 10 mL/hr at 02/01/20 0600, Rate Verify at 02/01/20 0600 .  Place/Maintain arterial line, , , Until Discontinued **AND** 0.9 %  sodium chloride infusion, , Intra-arterial, PRN, Ashok Pall, MD .  acetaminophen (TYLENOL) 160 MG/5ML solution 325 mg, 325 mg, Oral, Q4H PRN, Ashok Pall, MD .  chlorhexidine (PERIDEX) 0.12 % solution 15 mL, 15 mL, Mouth Rinse, BID, Collene Gobble, MD, 15 mL at 01/31/20 2145 .  Chlorhexidine Gluconate Cloth 2 % PADS 6 each, 6 each, Topical, Daily, Ashok Pall, MD, 6 each at 01/31/20 1030 .  docusate (COLACE) 50  MG/5ML liquid 50 mg, 50 mg, Per Tube, Daily, Collene Gobble, MD, 50 mg at 01/31/20 1210 .  famotidine (PEPCID) tablet 20 mg, 20 mg, Per Tube, QHS, Collene Gobble, MD, 20 mg at 01/31/20 2146 .  feeding supplement (OSMOLITE 1.5 CAL) liquid 1,000 mL, 1,000 mL, Per Tube, Continuous, Byrum, Rose Fillers, MD, Last Rate: 60 mL/hr at 01/31/20 1757, 1,000 mL at 01/31/20 1757 .  feeding supplement (PRO-STAT SUGAR FREE 64) liquid 30 mL, 30 mL, Per Tube, BID, Byrum, Rose Fillers, MD, 30 mL at 01/31/20 2145 .  hydrALAZINE (APRESOLINE) injection 10 mg, 10 mg, Intravenous, Q4H PRN, Ashok Pall, MD .  insulin aspart (novoLOG) injection 3-9 Units, 3-9 Units, Subcutaneous, Q4H, Frederik Pear, MD, 9 Units at 02/01/20 0817 .  insulin aspart (novoLOG) injection 4 Units, 4 Units, Subcutaneous, Q4H, Merlene Laughter F, NP, 4 Units at 02/01/20 [id number] .  insulin detemir (LEVEMIR) injection 16 Units, 0.15 Units/kg, Subcutaneous, BID, Frederik Pear, MD, 16 Units at 01/31/20 2304 .  labetalol (NORMODYNE) injection 5-20 mg, 5-20 mg, Intravenous, Q30 min PRN, Ashok Pall, MD, 20 mg at 01/27/20 [id number] .  levETIRAcetam (KEPPRA) IVPB 500 mg/100 mL premix, 500 mg, Intravenous, Q12H **OR** levETIRAcetam (KEPPRA) 100 MG/ML solution 500 mg, 500 mg, Per Tube, Q12H, Baltazar Apo  S, MD, 500 mg at 01/31/20 2145 .  lidocaine (PF) (XYLOCAINE) 1 % injection, , Infiltration, PRN, Corrie Mckusick, DO, 10 mL at 01/26/20 1204 .  MEDLINE mouth rinse, 15 mL, Mouth Rinse, BID, Ashok Pall, MD, 15 mL at 01/31/20 2146 .  morphine 2 MG/ML injection 2-6 mg, 2-6 mg, Intravenous, Q3H PRN, Ashok Pall, MD, 2 mg at 01/27/20 0308 .  mycophenolate (CELLCEPT) oral suspension 50 mg/mL, 500 mg, Per Tube, BID, Collene Gobble, MD, 500 mg at 01/31/20 2146 .  oxyCODONE (Oxy IR/ROXICODONE) immediate release tablet 5 mg, 5 mg, Per Tube, Q8H PRN, Byrum, Rose Fillers, MD .  polyethylene glycol (MIRALAX / GLYCOLAX) packet 17 g, 17 g, Oral, Daily PRN, Ashok Pall, MD .   sodium chloride flush (NS) 0.9 % injection 10-40 mL, 10-40 mL, Intracatheter, Q12H, Collene Gobble, MD, 10 mL at 01/31/20 2147 .  sodium chloride flush (NS) 0.9 % injection 10-40 mL, 10-40 mL, Intracatheter, PRN, Byrum, Rose Fillers, MD   Physical Exam: AOx1, FC x4 but minimal movement on R w/ inc'd tone, RLE externally rotated  Assessment & Plan: 59 y.o. man w/ SDH s/p evacuation, also has RLE DVT.  -RLE DVT but with occluded IVC, will order repeat [facility] for tomorrow to re-assess his SDH and help get a better idea of risk/benefit and timing of restarting anticoagulation   Judith Part  02/01/20 8:42 AM

## 2020-02-01 NOTE — Progress Notes (Signed)
NAME:  Brendan [name], MRN:  [id number], DOB:  [birth year], LOS: 9 ADMISSION DATE:  01/22/2020, CHIEF COMPLAINT:  Aphasia and weakness   Brief History   This is a 59 year old nursing home resident with 59 year old man with acute on chronic subdural hematoma.  On lovenox for RLE DVT. He was Sent to [facility] from SNF where he was felt to have worsening left sided weakness and aphasia starting at 1800 on 2/18 but had been noted to not be himself earlier that day.  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  He came to medical attention at his nursing home because of aphasia and weakness.  A CT scan of the head has shown acute on chronic subdural hematoma.  There is also some encephalomalacia on the left underlying the hematoma consequently there is no midline shift.   Also having worsening hematuria. Neurosurgery has evaluated the patient and is following. His lovenox was reversed with protamine on admission. PCCM contacted for admission.  Past Medical History  Otherwise remarkable for coronary disease diabetes history of DVT retroperitoneal fibrosis, prior CVA and IVC filter placement  Significant Hospital Events   2/18 >>admission  Consults:  Neurosurgery Nephrology  Procedures:  Left craniotomy for subdural hematoma evacuation 2/22 (Dr. Christella Noa) Percutaneous nephrostomy tube exchange 2/22 (Dr. Earleen Newport, IR) Left upper extremity midline 2/25 >>  Significant Diagnostic Tests:  2/21 CT Head > Some further enlargement of the subdural hematoma on the left. Overall volume increase estimated at about 1/3. Maximal thickness now measured at 2.5 cm compared with 2 cm previously. More hyperdense component. Summary distribution, including along the posterior falx and the tentorium. Increased mass effect with left-to-right shift of 7 mm today, increased from 4 mm previously.  2/20 KUB > no IVC filter in place  2/25 head CT >> difficult  improvement in mass-effect, postoperative changes post left subdural hematoma evacuation.  Rightward shift 4 mm (improved).  Small volume subdural hemorrhage noted along the tentorium.  New small area left superior frontal gyrus hypoattenuation  Micro Data:  COVID 2/19 > Negative  Urine cultures 2/19 > polymicrobial  Blood cultures 2/19 > negative MRSA PCR 2/19 > Positive   Antimicrobials:  Cefepime 2/19 >> 2/21  Interim history/subjective:  RN reports no acute events overnight, he is seen sitting up in bed eating breakfast this morning.  Objective   Blood pressure 129/84, pulse 92, temperature 98.8 F (37.1 C), temperature source Oral, resp. rate 19, height 6\' 1"  (1.854 m), weight 109.2 kg, SpO2 100 %.        Intake/Output Summary (Last 24 hours) at 02/01/2020 0901 Last data filed at 02/01/2020 0800 Gross per 24 hour  Intake 1409.97 ml  Output 3000 ml  Net -1590.03 ml   Filed Weights   01/22/20 2158 01/23/20 1500 01/29/20 1200  Weight: 112 kg 110.3 kg 109.2 kg    Examination: General: Chronically ill appearing elderly male lying in bed in NAD HEENT: Normocephalic/atraumatic, MM pink/moist, PERRL, core track tube in place Neuro: Alert and interactive this morning, seen eating self utilization of left upper extremity.  No movement to RUE or RLE CV: s1s2 regular rate and rhythm, no murmur, rubs, or gallops,  PULM: Clear to auscultation bilaterally, no increased work of breathing, no added breath sounds GI: soft, bowel sounds active in all 4 quadrants, non-tender, non-distended, tolerating TF Extremities: warm/dry, 1+ edema to LLE, 3+ pitting edema to RLE edema  Skin: no  rashes or lesions  Resolved Hospital Problem   Fever - on admission - received 48 hours empiric cefepime  Assessment & Plan:  Nichola Holst is a 59 yo gentleman with a past medical history of CKD, HTN and obstructive uropathy with left nephrostomy tube who lives in a SNF and presented on 2/19 with:  Acute on  chronic subdural hematoma -Successful craniotomy and clot evacuation 2/22, repeat CT head 2/26 reassuring. P: Neurology and neurosurgery following, appreciate assistance Hemoglobin remained stable, status post 2 unit PRBC during admission  Coordinated with neurosurgery for plans to repeat head CT 3/1 If repeat Head CT reassuring will consider resumption of anticoagulation at that time Maintain neuro protective measures; goal for eurothermia, euglycemia, eunatermia, normoxia, and PCO2 goal of 35-40 Nutrition and bowel regiment  Seizure precautions  Keppra per neurology  SLP management for dysphagia  Acute metabolic encephalopathy, improving -Secondary to acute on chronic subdural hematoma resulting in worsening lethargy and aphasia consistent with location of hematoma.  P: Continue to minimize sedation PT/OT efforts as able Delirium precautions  DVT right leg s/p treatment with enoxaparin due to Xarelto failure. 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 Earleen Newport in IR.  No role for reattempt IVC placement.  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  P: Follow CBC Transfuse per protocol Hemoglobin goal greater than 7  Hyperkalemia P: Potassium remains elevated at 5.82/28 Redose Lokelma again today Trend to bmet  Hyperglycemia P: SSI increased to moderate pale Long-acting insulin and prevent Tube feed coverage added 2/28  Goals of care Long discussion held with patient's Sister Danton Clap [phone] regarding plan of care.  Discussed risk versus benefit of anticoagulation in the setting of femoral DVT and had acute on chronic subdural hematoma.  At this time family wishes to continue with full CODE STATUS.  Sister is hoping to visit patient with patient's mother early this coming week.   Best practice:  Diet: Dysphagia 2, tube feeding Pain/Anxiety/Delirium protocol (if indicated): Not indicated VAP protocol (if indicated): Not currently indicated DVT prophylaxis: will placed SCD on the left side, right leg has DVT.  GI prophylaxis: Pepcid ordered Glucose control: Sliding scale insulin ordered Mobility: Bedrest Code Status: Full Family: We will plan to update again today 2/28 Disposition: ICU.   Labs   CBC: Recent Labs  Lab 01/26/20 0941 01/27/20 0543 01/30/20 1321 01/31/20 0531 02/01/20 0741  WBC 7.2 13.0* 10.2 8.3 8.2  HGB 8.2* 7.3* 6.0* 8.4* 8.6*  HCT 28.4* 25.4* 21.4* 28.2* 29.3*  MCV 95.0 96.2 98.2 94.3 95.8  PLT 264 370 161 249 [id number]    Basic Metabolic Panel: Recent Labs  Lab 01/28/20 0529 01/29/20 0546 01/30/20 1321 01/31/20 0531 02/01/20 0741  NA 145  141 136 138 135  K 4.8 5.2* 5.8* 5.8* 5.8*  CL 116* 114* 111 112* 108  CO2 18* 19* 17* 19* 21*  GLUCOSE 131* 136* 192* 212* 254*  BUN 47* 45* 42* 39* 30*  CREATININE 2.78* 2.28* 2.18* 1.98* 1.88*  CALCIUM 8.1* 7.8* 7.9* 8.2* 8.7*  MG 2.3  --  2.3 2.1  --    GFR: Estimated Creatinine Clearance: 54.1 mL/min (A) (by C-G formula based on SCr of 1.88 mg/dL (H)). Recent Labs  Lab 01/27/20 0543  01/30/20 1321 01/31/20 0531 02/01/20 0741  WBC 13.0* 10.2 8.3 8.2    Liver Function Tests: No results for input(s): AST, ALT, ALKPHOS, BILITOT, PROT, ALBUMIN in the last 168 hours. No results for input(s): LIPASE, AMYLASE in the last 168 hours. No results for input(s): AMMONIA in the last 168 hours.  ABG    Component Value Date/Time   TCO2 25 01/13/2020 1007     Coagulation Profile: No results for input(s): INR, PROTIME in the last 168 hours.  Cardiac Enzymes: No results for input(s): CKTOTAL, CKMB, CKMBINDEX, TROPONINI in the last 168 hours.  HbA1C: Hgb A1c MFr Bld  Date/Time Value Ref Range Status  01/24/2020 05:17 AM 5.0 4.8 - 5.6 % Final    Comment:    (NOTE) Pre diabetes:          5.7%-6.4% Diabetes:              >6.4% Glycemic control for   <7.0% adults with diabetes   01/23/2020 04:32 PM 5.2 4.8 - 5.6 % Final    Comment:    (NOTE) Pre diabetes:          5.7%-6.4% Diabetes:              >6.4% Glycemic control for   <7.0% adults with diabetes     CBG: Recent Labs  Lab 01/31/20 1920 02/01/20 0004 02/01/20 0201 02/01/20 0431 02/01/20 0718  GLUCAP 335* 262* 254* 261* 240*   Signature Johnsie Cancel, NP-C [facility] Contact / Pager information can be found on Amion  02/01/2020, 9:01 AM

## 2020-02-02 ENCOUNTER — Inpatient Hospital Stay (HOSPITAL_COMMUNITY): Payer: Medicaid Other

## 2020-02-02 DIAGNOSIS — I82401 Acute embolism and thrombosis of unspecified deep veins of right lower extremity: Secondary | ICD-10-CM

## 2020-02-02 LAB — GLUCOSE, CAPILLARY
Glucose-Capillary: 243 mg/dL — ABNORMAL HIGH (ref 70–99)
Glucose-Capillary: 180 mg/dL — ABNORMAL HIGH (ref 70–99)
Glucose-Capillary: 206 mg/dL — ABNORMAL HIGH (ref 70–99)
Glucose-Capillary: 229 mg/dL — ABNORMAL HIGH (ref 70–99)
Glucose-Capillary: 251 mg/dL — ABNORMAL HIGH (ref 70–99)
Glucose-Capillary: 275 mg/dL — ABNORMAL HIGH (ref 70–99)

## 2020-02-02 LAB — BASIC METABOLIC PANEL
Anion gap: 9 (ref 5–15)
BUN: 29 mg/dL — ABNORMAL HIGH (ref 6–20)
CO2: 22 mmol/L (ref 22–32)
Calcium: 9.1 mg/dL (ref 8.9–10.3)
Chloride: 109 mmol/L (ref 98–111)
Creatinine, Ser: 1.95 mg/dL — ABNORMAL HIGH (ref 0.61–1.24)
GFR calc Af Amer: 42 mL/min — ABNORMAL LOW (ref >60)
GFR calc non Af Amer: 36 mL/min — ABNORMAL LOW (ref >60)
Glucose, Bld: 181 mg/dL — ABNORMAL HIGH (ref 70–99)
Potassium: 5.6 mmol/L — ABNORMAL HIGH (ref 3.5–5.1)
Sodium: 140 mmol/L (ref 135–145)

## 2020-02-02 LAB — CBC
HCT: 29.6 % — ABNORMAL LOW (ref 39.0–52.0)
Hemoglobin: 8.8 g/dL — ABNORMAL LOW (ref 13.0–17.0)
MCH: 28.2 pg (ref 26.0–34.0)
MCHC: 29.7 g/dL — ABNORMAL LOW (ref 30.0–36.0)
MCV: 94.9 fL (ref 80.0–100.0)
Platelets: 238 10*3/uL (ref 150–400)
RBC: 3.12 MIL/uL — ABNORMAL LOW (ref 4.22–5.81)
RDW: 17 % — ABNORMAL HIGH (ref 11.5–15.5)
WBC: 8.2 10*3/uL (ref 4.0–10.5)
nRBC: 0.5 % — ABNORMAL HIGH (ref 0.0–0.2)

## 2020-02-02 MED ORDER — PRO-STAT SUGAR FREE PO LIQD
30.0000 mL | Freq: Three times a day (TID) | ORAL | Status: DC
  Administered 2020-02-02 – 2020-02-04 (×6): 30 mL
  Filled 2020-02-02 (×6): qty 30

## 2020-02-02 MED ORDER — INSULIN DETEMIR 100 UNIT/ML ~~LOC~~ SOLN
22.0000 [IU] | Freq: Two times a day (BID) | SUBCUTANEOUS | Status: DC
  Administered 2020-02-02 (×2): 22 [IU] via SUBCUTANEOUS
  Filled 2020-02-02 (×3): qty 0.22

## 2020-02-02 MED ORDER — OSMOLITE 1.5 CAL PO LIQD
1260.0000 mL | ORAL | Status: DC
  Administered 2020-02-02 – 2020-02-04 (×2): 1260 mL
  Filled 2020-02-02 (×3): qty 2000

## 2020-02-02 NOTE — Progress Notes (Signed)
Midline occluded.  Removed intact as per flowsheet.  Assessed bilateral arms with ultrasound.  No suitable veins visualized.  Staff RN Lily aware.  MD notified.

## 2020-02-02 NOTE — Progress Notes (Signed)
Nutrition Follow-up  DOCUMENTATION CODES:   Not applicable  INTERVENTION:   Tube feeding:  Adjust to nocturnal feedings:  Osmolite 1.5 @ 90 ml/hr via Cortrak from 5 pm to 7 am (14 hours) 30 ml Prostat TID  Provides: 2190 kcals, 130 grams protein, 962 ml free water.   Monitor intake and adjust TF as appropriate.   NUTRITION DIAGNOSIS:   Inadequate oral intake related to decreased appetite as evidenced by meal completion < 50%. Ongoing.   GOAL:   Patient will meet greater than or equal to 90% of their needs Met with TF  MONITOR:   PO intake, Supplement acceptance, TF tolerance  REASON FOR ASSESSMENT:   Other (Comment)(Cortrak placement)    ASSESSMENT:   Patient with PMH significant for CKD IV, DM, essential HTN, GERD, CVA with residual hemiparesis (R side), HLD, and PVD. Presents this admission with acute on chronic SDH with AMS.    Pt discussed during ICU rounds and with RN.  2/19 cortrak placed, no TF started per MD 2/22 L crani; percutaneous nephrostomy tube exchange  2/23 TF started; Dysphagia 2/Thin liquids started; PO 0-10% 3/1 diet advanced to Dysphagia 3/Thin liquids; per RN pt ate about 25% of his lunch with assistance   Medications: colace, SS novolog, 6 units novolog every 4 hours, 22 units levemir BID  Labs: K+ 5.6 (H) CBG  [phone]  TF: Osmolite 1.5 @ 60 ml/hr and 30 ml Prostat BID Provides: 2360 kcals, 120 grams protein  Diet Order:   Diet Order            DIET DYS 3 Room service appropriate? Yes; Fluid consistency: Thin  Diet effective now              EDUCATION NEEDS:   Not appropriate for education at this time  Skin:  Skin Assessment: Skin Integrity Issues: Skin Integrity Issues:: Other (Comment) Other: MASD- buttocks, groin  Last BM:  2/27  Height:   Ht Readings from Last 1 Encounters:  01/23/20 _0  (1.854 m)    Weight:   Wt Readings from Last 1 Encounters:  01/29/20 109.2 kg    Ideal Body Weight:  83.6  kg  BMI:  Body mass index is 31.76 kg/m.  Estimated Nutritional Needs:   Kcal:  2200-2400 kcal  Protein:  110-125 grams  Fluid:  >/= 2.2 L/day  Lockie Pares., RD, LDN, CNSC See AMiON for contact information

## 2020-02-02 NOTE — Progress Notes (Signed)
Physical Therapy Treatment Patient Details Name: Brendan [name] MRN: [id number] DOB: 01/20/1961 Today's Date: 02/02/2020    History of Present Illness 59 year old nursing home resident with DM, L CVA 2009, and CAD who is chronically anticoagulated with Lovenox due to the presence of a right lower extremity DVT despite the fact that he has a IVC filter in place.  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  He came to medical attention at his [place] home because of aphasia and weakness.  A CT scan of the head has shown acute on chronic subdural hematoma., pt s/p evacuation of L SDH 01/26/20    PT Comments    Pt was a bit lethargic, but aroused himself and participated well.  He initiated well to command and needed moderate to maximal assist to transition and the transfer, respectively.  Pt reports that he needs a similar level of assist at the nursing facility.    Follow Up Recommendations  SNF;Supervision/Assistance - 24 hour     Equipment Recommendations  None recommended by PT    Recommendations for Other Services       Precautions / Restrictions Precautions Precautions: Fall Precaution Comments: R hemiplegia, cortrak, nephrostomy drain Restrictions Weight Bearing Restrictions: No    Mobility  Bed Mobility Overal bed mobility: Needs Assistance Bed Mobility: Supine to Sit     Supine to sit: Mod assist     General bed mobility comments: Initiated to command with bil LE's and rolling toward R, and needed light moderate assist due to up from weaker R side.  Still he used his Left side to his best advantage.  Transfers Overall transfer level: Needs assistance Equipment used: None Transfers: Sit to/from [employer] Sit to Stand: Max assist;+2 safety/equipment Stand pivot transfers: Max assist;+2 physical assistance;+2 safety/equipment       General transfer comment: pt biased a  little more to stronger side, but did help bear some weight on the R side for stance and pivot.  Ambulation/Gait             General Gait Details: not tested   Stairs             Wheelchair Mobility    Modified Rankin (Stroke Patients Only) Modified Rankin (Stroke Patients Only) Pre-Morbid Rankin Score: Moderately severe disability Modified Rankin: Severe disability     Balance Overall balance assessment: Needs assistance   Sitting balance-Leahy Scale: Fair       Standing balance-Leahy Scale: Poor Standing balance comment: MaxA for upright                            Cognition Arousal/Alertness: Awake/alert Behavior During Therapy: Flat affect;WFL for tasks assessed/performed Overall Cognitive Status: (NT formally, followed simple commands.)                                        Exercises General Exercises - Upper Extremity Elbow Flexion: AROM;AAROM;Both;10 reps;Supine(aa (limited on the R) and resisted on the L) Elbow Extension: AROM;AAROM;Both;Supine(aa (limited on the R) and resisted on the L) General Exercises - Lower Extremity Heel Slides: AROM;AAROM;Both;10 reps(aa on the R and resisted on the L LE)    General Comments General comments (skin integrity, edema, etc.): vss      Pertinent Vitals/Pain Pain Assessment: Faces Faces Pain  Scale: No hurt Pain Intervention(s): Monitored during session    Home Living                      Prior Function            PT Goals (current goals can now be found in the care plan section) Acute Rehab PT Goals Patient Stated Goal: unable to state PT Goal Formulation: Patient unable to participate in goal setting Time For Goal Achievement: 02/07/20 Potential to Achieve Goals: Fair Progress towards PT goals: Progressing toward goals    Frequency    Min 3X/week      PT Plan Current plan remains appropriate    Co-evaluation              AM-PAC PT "6 Clicks"  Mobility   Outcome Measure  Help needed turning from your back to your side while in a flat bed without using bedrails?: A Little Help needed moving from lying on your back to sitting on the side of a flat bed without using bedrails?: A Lot Help needed moving to and from a bed to a chair (including a wheelchair)?: Total Help needed standing up from a chair using your arms (e.g., wheelchair or bedside chair)?: Total Help needed to walk in hospital room?: Total Help needed climbing 3-5 steps with a railing? : Total 6 Click Score: 9    End of Session   Activity Tolerance: Patient tolerated treatment well Patient left: in chair;with call bell/phone within reach;with chair alarm set Nurse Communication: Mobility status PT Visit Diagnosis: Other abnormalities of gait and mobility (R26.89);Muscle weakness (generalized) (M62.81);Hemiplegia and hemiparesis Hemiplegia - Right/Left: Right     Time: [id number] PT Time Calculation (min) (ACUTE ONLY): 17 min  Charges:  $Therapeutic Activity: 8-22 mins                     02/02/2020  Ginger Carne., PT Acute Rehabilitation Services [phone]  (pager) [phone]  (office)   Brendan [name] 02/02/2020, 4:26 PM

## 2020-02-02 NOTE — Progress Notes (Signed)
Transported patient to and from CT; midline occluded post transport. IV team consulted, midline had to be removed and another midline/IV could not be placed due to poor vasculature. Patient currently without IV access, CCM notified. Will continue to monitor.

## 2020-02-02 NOTE — Progress Notes (Signed)
  Speech Language Pathology Treatment: Dysphagia;Cognitive-Linquistic  Patient Details Name: Brendan [name] MRN: [id number] DOB: 22-Oct-1961 Today's Date: 02/02/2020 Time: [id number] SLP Time Calculation (min) (ACUTE ONLY): 21 min  Assessment / Plan / Recommendation Clinical Impression  Pt was seen while son was also videoconferencing into the room for a visit. Using yes/no questions and given verbal options, pt was able to communicate that he only had coffee for breakfast (confirmed by RN). When asked if it was because of the texture, he nodded "yes." Pt mostly responds in one-word answers to SLP and responses are inconsistently given, but he responds with much more consistency with his son. He used short phrases and gestures to show intellectual awareness of R-sided deficits with Min cues given. Pt also tried advanced solids to assess for readiness to advance diet with mildly prolonged mastication and a single throat clear noted across limited trials. Pt still declines most solids offered. Recommend advancing to Dys 3 diet/thin liquids to allow for some more options from the menu while still keeping the food a little softer for efficiency and safety. Will f/u for tolerance.   HPI HPI: This is a 59 year old nursing home resident with DM, L CVA 2009, and CAD who is chronically anticoagulated with Lovenox due to the presence of a right lower extremity DVT despite the fact that he has a IVC filter in place.  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  He came to medical attention at his [place] home because of aphasia and weakness.  A CT scan of the head has shown acute on chronic subdural hematoma, now s/p evacuation 2/22.      SLP Plan  Continue with current plan of care       Recommendations  Diet recommendations: Dysphagia 3 (mechanical soft);Thin liquid Liquids provided via: Cup;Straw Medication Administration: Whole  meds with liquid Supervision: Staff to assist with self feeding Compensations: Slow rate;Small sips/bites Postural Changes and/or Swallow Maneuvers: Seated upright 90 degrees                Oral Care Recommendations: Oral care BID Follow up Recommendations: Skilled Nursing facility SLP Visit Diagnosis: Dysphagia, oral phase (R13.11);Aphasia (R47.01) Plan: Continue with current plan of care       GO                 Osie Bond., M.A. [facility] Pager [phone] Office [phone]  02/02/2020, 11:32 AM

## 2020-02-02 NOTE — Progress Notes (Signed)
Orders received for IV restart. Patient had a Midline in LUE, which occluded. Patient is seen by nephrology outside hospital. Nephrology OK'd Midline placement. LUE assessed, as RUE is contracted and has decreased ROM. Cephalic vein too small to cannulate. Brachial and Basilic veins viewed, but non-compressible at site of prior Midline. Estill Bamberg, RN informed of findings. Estill Bamberg, RN to inform primary to make decision about IV access.

## 2020-02-02 NOTE — Progress Notes (Signed)
Discussed with Dr. Augustin Coupe with Nephrology and is ok with patient having midline catheter placed by IV team.     Kennieth Rad, MSN, AGACNP-BC [facility] 02/02/2020, 10:50 AM

## 2020-02-02 NOTE — Progress Notes (Signed)
NAME:  Brendan [name], MRN:  [id number], DOB:  Nov 17, 1961, LOS: 10 ADMISSION DATE:  01/22/2020, CHIEF COMPLAINT:  Aphasia and weakness   Brief History   This is a 59 year old nursing home resident with 59 year old man with acute on chronic subdural hematoma.  On lovenox for RLE DVT. He was Sent to [facility] from SNF where he was felt to have worsening left sided weakness and aphasia starting at 1800 on 2/18 but had been noted to not be himself earlier that day.  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  He came to medical attention at his nursing home because of aphasia and weakness.  A CT scan of the head has shown acute on chronic subdural hematoma.  There is also some encephalomalacia on the left underlying the hematoma consequently there is no midline shift.   Also having worsening hematuria. Neurosurgery has evaluated the patient and is following. His lovenox was reversed with protamine on admission. PCCM contacted for admission.  Past Medical History  Otherwise remarkable for coronary disease diabetes history of DVT retroperitoneal fibrosis, prior CVA and IVC filter placement  Significant Hospital Events   2/18 >>admission 2/22 >> Left nephrostomy tube exchange IR   Consults:  Neurosurgery Nephrology IR   Procedures:  Left craniotomy for subdural hematoma evacuation 2/22 (Dr. Christella Noa) Percutaneous nephrostomy tube exchange 2/22 (Dr. Earleen Newport, IR) Left upper extremity midline 2/25 >>  Significant Diagnostic Tests:  2/21 CT Head > Some further enlargement of the subdural hematoma on the left. Overall volume increase estimated at about 1/3. Maximal thickness now measured at 2.5 cm compared with 2 cm previously. More hyperdense component. Summary distribution, including along the posterior falx and the tentorium. Increased mass effect with left-to-right shift of 7 mm today, increased from 4 mm previously.  2/20 KUB > no IVC  filter in place  2/25 head CT >> difficult improvement in mass-effect, postoperative changes post left subdural hematoma evacuation.  Rightward shift 4 mm (improved).  Small volume subdural hemorrhage noted along the tentorium.  New small area left superior frontal gyrus hypoattenuation  3/1 CT head  >> 1. Slight decrease in size of residual mixed density extra-axial hemorrhage over the left convexity. 2. Stable to slightly improved midline shift. 3. No new hemorrhage. 4. Stable left-sided white matter infarcts. 5. Chronic sinus disease.  Micro Data:  COVID 2/19 > Negative  Urine cultures 2/19 > polymicrobial  Blood cultures 2/19 > negative MRSA PCR 2/19 > Positive   Antimicrobials:  Cefepime 2/19 >> 2/21 Cefazolin 2/22  Interim history/subjective:  Stable repeat CTH overnight with stable midline shift tmax 99.3 RN reports patient has no PIV access.  His midline came out.   Objective   Blood pressure 131/76, pulse 79, temperature 99.3 F (37.4 C), temperature source Axillary, resp. rate 17, height 6\' 1"  (1.854 m), weight 109.2 kg, SpO2 100 %.        Intake/Output Summary (Last 24 hours) at 02/02/2020 0809 Last data filed at 02/02/2020 0600 Gross per 24 hour  Intake 1430.65 ml  Output 2400 ml  Net -969.35 ml   Filed Weights   01/22/20 2158 01/23/20 1500 01/29/20 1200  Weight: 112 kg 110.3 kg 109.2 kg    Examination: General:  Chronically ill appearing elderly male lying in bed in no distress HEENT: MM pink/moist, poor dentition, right nare cortrak, pupils 3/reactive Neuro: Sleeping but awakens to voice, oriented to name only, speech slightly slurred,  f/c, MAE well except RUE, weaker RLE ( minimal movement to gravity ) CV: rr, no murmur PULM:  Non labored, on room air at 100%, clear throughout GI: obese, soft, bs active, condom cath on- no output, left nephrostomy tube with CYU Extremities: warm/dry, trace generalized edema, RLE +3 edema - no warmth or erythema, LUE with  hand contracture and muscle wasting  Skin: no rashes  Resolved Hospital Problem   Fever - on admission - received 48 hours empiric cefepime  Assessment & Plan:  Brendan [name] is a 59 yo gentleman with a past medical history of CKD, HTN and obstructive uropathy with left nephrostomy tube who lives in a SNF and presented on 2/19 with:  Acute on chronic subdural hematoma -Successful craniotomy and clot evacuation 2/22, repeat CT head 2/26 reassuring. P: NSGY following F/u CTH overnight stable Pending consideration from NSGY to resume anticoagulation for RLE DVT, weighing risk vs benefit ratio  Continue keppra and seizure precautions PT/OT/ SLP  Serial neuro exams  Acute metabolic encephalopathy, improving -Secondary to acute on chronic subdural hematoma resulting in worsening lethargy and aphasia consistent with location of hematoma.  P: Minimize sedation Stabilize any metabolic derangements Put PT/OT efforts  Delirium precautions   DVT right leg s/p treatment with enoxaparin due to Xarelto failure. 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 Earleen Newport in IR.  No role for reattempt IVC placement.  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  P: H/H stable  Trend CBC, transfuse for Hgb > 7  Hyperkalemia P: Pending BMET this am   Hyperglycemia P: SSI resistant TF coverage 6 units q 4 Levemir 20 units BID, increase to 22 units BID  Poor venous access  P:  Have IV reassess for PIV/ midline.  Not a candidate for PICC given renal function.  He meets no criteria for central access    Goals of care Ongoing.  Discussion held with patient's Sister Danton Clap [id number] regarding plan of care and risk versus benefit of anticoagulation in the setting of femoral DVT and had acute on chronic subdural hematoma.  At this time family wishes to continue with full CODE STATUS.  Sister is hoping to visit patient with patient's mother early this coming week.   Best practice:  Diet: Dysphagia 2, tube feeding Pain/Anxiety/Delirium protocol (if indicated): Not indicated VAP protocol (if indicated): Not currently indicated DVT prophylaxis: will placed SCD on the left side, right leg has DVT.  GI prophylaxis: Pepcid  Glucose control: SSI Mobility: Bedrest Code Status: Full Family:  Pending for 3/1 Disposition: ICU for now, if lovenox is restarted  today, he will need observation otherwise, can be transferred to tele bed.   Labs   CBC: Recent Labs  Lab 01/26/20 0941 01/27/20 0543 01/30/20 1321 01/31/20 0531 02/01/20 0741  WBC 7.2 13.0* 10.2 8.3 8.2  HGB 8.2* 7.3* 6.0* 8.4* 8.6*  HCT 28.4* 25.4* 21.4* 28.2* 29.3*  MCV 95.0 96.2 98.2 94.3 95.8  PLT 264 370 161 249 [id number]    Basic Metabolic Panel: Recent Labs  Lab 01/28/20 0529 01/29/20 0546 01/30/20 1321 01/31/20 0531 02/01/20 0741  NA 145 141 136 138 135  K 4.8 5.2* 5.8* 5.8* 5.8*  CL 116* 114* 111 112* 108  CO2 18* 19* 17*  19* 21*  GLUCOSE 131* 136* 192* 212* 254*  BUN 47* 45* 42* 39* 30*  CREATININE 2.78* 2.28* 2.18* 1.98* 1.88*  CALCIUM 8.1* 7.8* 7.9* 8.2* 8.7*  MG 2.3  --  2.3 2.1  --    GFR: Estimated Creatinine Clearance: 54.1 mL/min (A) (by C-G formula based on SCr of 1.88 mg/dL (H)). Recent Labs  Lab 01/27/20 0543 01/30/20 1321 01/31/20 0531 02/01/20 0741  WBC 13.0* 10.2 8.3 8.2    Liver Function Tests: No results for input(s): AST, ALT, ALKPHOS, BILITOT, PROT, ALBUMIN in the last 168 hours. No results for input(s): LIPASE, AMYLASE in the last 168 hours. No results for input(s): AMMONIA in the last 168 hours.  ABG    Component Value Date/Time   TCO2 25 01/13/2020 1007     Coagulation Profile: No results for input(s): INR, PROTIME in the last 168 hours.  Cardiac Enzymes: No results for input(s): CKTOTAL, CKMB, CKMBINDEX, TROPONINI in the last 168 hours.  HbA1C: Hgb A1c MFr Bld  Date/Time Value Ref Range Status  01/24/2020 05:17 AM 5.0 4.8 - 5.6 % Final    Comment:    (NOTE) Pre diabetes:          5.7%-6.4% Diabetes:              >6.4% Glycemic control for   <7.0% adults with diabetes   01/23/2020 04:32 PM 5.2 4.8 - 5.6 % Final    Comment:    (NOTE) Pre diabetes:          5.7%-6.4% Diabetes:              >6.4% Glycemic control for   <7.0% adults with diabetes     CBG: Recent Labs  Lab 02/01/20 1529 02/01/20 1939 02/01/20 2322 02/02/20 0327 02/02/20 0740  GLUCAP [place], MSN, AGACNP-BC [facility] 02/02/2020, 8:10 AM

## 2020-02-02 NOTE — Progress Notes (Signed)
Patient ID: Brendan [name], male   DOB: 1960-12-05, 59 y.o.   MRN: [id number] BP 128/77   Pulse 95   Temp 98.6 F (37 C) (Oral)   Resp 16   Ht 6\' 1"  (1.854 m)   Wt 109.2 kg   SpO2 100%   BMI 31.76 kg/m  [name] was cleared for anticoagulation last week as stated in my note. His decreased hematocrit is the reason it was not started. If CCM is satisfied with their workup of his blood loss then as stated anticoagulation can be started. If heparin, then start without a bolus.  Alert, oriented x 4, speech is mildly dysarthric.  At baseline, doing well neurologically.

## 2020-02-02 NOTE — Progress Notes (Signed)
[facility] Progress Note [name] DOB: Apr 16, 1961 MRN: [id number]   Date of Service  02/02/2020  HPI/Events of Note  Informed that patient does not have IV access. Head CT with decrease in hemamtoma  eICU Interventions  Will defer to bedside MDs to discuss what best access can be provided     Intervention Category Intermediate Interventions: Other:  Judd Lien 02/02/2020, 6:59 AM

## 2020-02-02 NOTE — Progress Notes (Signed)
Assisted tele visit to patient with son.  Akshat Minehart P, RN  

## 2020-02-03 DIAGNOSIS — Z515 Encounter for palliative care: Secondary | ICD-10-CM | POA: Insufficient documentation

## 2020-02-03 DIAGNOSIS — Z7189 Other specified counseling: Secondary | ICD-10-CM | POA: Insufficient documentation

## 2020-02-03 LAB — GLUCOSE, CAPILLARY
Glucose-Capillary: 144 mg/dL — ABNORMAL HIGH (ref 70–99)
Glucose-Capillary: 259 mg/dL — ABNORMAL HIGH (ref 70–99)
Glucose-Capillary: 277 mg/dL — ABNORMAL HIGH (ref 70–99)
Glucose-Capillary: 277 mg/dL — ABNORMAL HIGH (ref 70–99)
Glucose-Capillary: 286 mg/dL — ABNORMAL HIGH (ref 70–99)
Glucose-Capillary: 304 mg/dL — ABNORMAL HIGH (ref 70–99)

## 2020-02-03 LAB — CBC
HCT: 29.9 % — ABNORMAL LOW (ref 39.0–52.0)
Hemoglobin: 8.8 g/dL — ABNORMAL LOW (ref 13.0–17.0)
MCH: 28 pg (ref 26.0–34.0)
MCHC: 29.4 g/dL — ABNORMAL LOW (ref 30.0–36.0)
MCV: 95.2 fL (ref 80.0–100.0)
Platelets: 257 10*3/uL (ref 150–400)
RBC: 3.14 MIL/uL — ABNORMAL LOW (ref 4.22–5.81)
RDW: 16.9 % — ABNORMAL HIGH (ref 11.5–15.5)
WBC: 7.2 10*3/uL (ref 4.0–10.5)
nRBC: 0.3 % — ABNORMAL HIGH (ref 0.0–0.2)

## 2020-02-03 LAB — BASIC METABOLIC PANEL
Anion gap: 9 (ref 5–15)
BUN: 35 mg/dL — ABNORMAL HIGH (ref 6–20)
CO2: 21 mmol/L — ABNORMAL LOW (ref 22–32)
Calcium: 8.9 mg/dL (ref 8.9–10.3)
Chloride: 107 mmol/L (ref 98–111)
Creatinine, Ser: 1.92 mg/dL — ABNORMAL HIGH (ref 0.61–1.24)
GFR calc Af Amer: 43 mL/min — ABNORMAL LOW (ref >60)
GFR calc non Af Amer: 37 mL/min — ABNORMAL LOW (ref >60)
Glucose, Bld: 281 mg/dL — ABNORMAL HIGH (ref 70–99)
Potassium: 5.9 mmol/L — ABNORMAL HIGH (ref 3.5–5.1)
Sodium: 137 mmol/L (ref 135–145)

## 2020-02-03 MED ORDER — SODIUM ZIRCONIUM CYCLOSILICATE 10 G PO PACK
10.0000 g | PACK | Freq: Two times a day (BID) | ORAL | Status: AC
  Administered 2020-02-03 – 2020-02-04 (×2): 10 g via ORAL
  Filled 2020-02-03 (×2): qty 1

## 2020-02-03 MED ORDER — INSULIN ASPART 100 UNIT/ML ~~LOC~~ SOLN
0.0000 [IU] | SUBCUTANEOUS | Status: DC
  Administered 2020-02-03 (×3): 11 [IU] via SUBCUTANEOUS
  Administered 2020-02-03: 15 [IU] via SUBCUTANEOUS
  Administered 2020-02-03 (×2): 11 [IU] via SUBCUTANEOUS
  Administered 2020-02-03: 3 [IU] via SUBCUTANEOUS
  Administered 2020-02-04: 11 [IU] via SUBCUTANEOUS
  Administered 2020-02-04: 15 [IU] via SUBCUTANEOUS
  Administered 2020-02-04: 11 [IU] via SUBCUTANEOUS
  Administered 2020-02-04: 4 [IU] via SUBCUTANEOUS
  Administered 2020-02-04: 15 [IU] via SUBCUTANEOUS
  Administered 2020-02-05: 20 [IU] via SUBCUTANEOUS
  Administered 2020-02-05: 15 [IU] via SUBCUTANEOUS

## 2020-02-03 MED ORDER — INSULIN DETEMIR 100 UNIT/ML ~~LOC~~ SOLN
10.0000 [IU] | Freq: Two times a day (BID) | SUBCUTANEOUS | Status: DC
  Filled 2020-02-03 (×2): qty 0.1

## 2020-02-03 MED ORDER — HEPARIN SODIUM (PORCINE) 5000 UNIT/ML IJ SOLN
5000.0000 [IU] | Freq: Three times a day (TID) | INTRAMUSCULAR | Status: DC
  Administered 2020-02-03 – 2020-02-13 (×32): 5000 [IU] via SUBCUTANEOUS
  Filled 2020-02-03 (×32): qty 1

## 2020-02-03 MED ORDER — INSULIN DETEMIR 100 UNIT/ML ~~LOC~~ SOLN
25.0000 [IU] | Freq: Two times a day (BID) | SUBCUTANEOUS | Status: DC
  Administered 2020-02-03 – 2020-02-05 (×5): 25 [IU] via SUBCUTANEOUS
  Filled 2020-02-03 (×6): qty 0.25

## 2020-02-03 NOTE — Progress Notes (Signed)
NAME:  Brendan [name], MRN:  [id number], DOB:  12-18-1960, LOS: 3 ADMISSION DATE:  01/22/2020, CHIEF COMPLAINT:  Aphasia and weakness   Brief History   This is a 59 year old nursing home resident with 59 year old man with acute on chronic subdural hematoma.  On lovenox for RLE DVT. He was Sent to [facility] from SNF where he was felt to have worsening left sided weakness and aphasia starting at 1800 on 2/18 but had been noted to not be himself earlier that day.  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  He came to medical attention at his nursing home because of aphasia and weakness.  A CT scan of the head has shown acute on chronic subdural hematoma.  There is also some encephalomalacia on the left underlying the hematoma consequently there is no midline shift.   Also having worsening hematuria. Neurosurgery has evaluated the patient and is following. His lovenox was reversed with protamine on admission. PCCM contacted for admission.  Past Medical History  Otherwise remarkable for coronary disease diabetes history of DVT retroperitoneal fibrosis, prior CVA and IVC filter placement  Significant Hospital Events   2/18 >>admission 2/22 >> Left nephrostomy tube exchange IR   Consults:  Neurosurgery Nephrology IR   Procedures:  Left craniotomy for subdural hematoma evacuation 2/22 (Dr. Christella Noa) Percutaneous nephrostomy tube exchange 2/22 (Dr. Earleen Newport, IR) Left upper extremity midline 2/25 >>  Significant Diagnostic Tests:  2/21 CT Head > Some further enlargement of the subdural hematoma on the left. Overall volume increase estimated at about 1/3. Maximal thickness now measured at 2.5 cm compared with 2 cm previously. More hyperdense component. Summary distribution, including along the posterior falx and the tentorium. Increased mass effect with left-to-right shift of 7 mm today, increased from 4 mm previously.  2/20 KUB > no IVC  filter in place  2/25 head CT >> difficult improvement in mass-effect, postoperative changes post left subdural hematoma evacuation.  Rightward shift 4 mm (improved).  Small volume subdural hemorrhage noted along the tentorium.  New small area left superior frontal gyrus hypoattenuation  3/1 CT head  >> 1. Slight decrease in size of residual mixed density extra-axial hemorrhage over the left convexity. 2. Stable to slightly improved midline shift. 3. No new hemorrhage. 4. Stable left-sided white matter infarcts. 5. Chronic sinus disease.  Micro Data:  COVID 2/19 > Negative  Urine cultures 2/19 > polymicrobial  Blood cultures 2/19 > negative MRSA PCR 2/19 > Positive   Antimicrobials:  Cefepime 2/19 >> 2/21 Cefazolin 2/22  Interim history/subjective:  No events overnight.  Patient without complaints.  Objective   Blood pressure 136/83, pulse 97, temperature 98.5 F (36.9 C), temperature source Oral, resp. rate (!) 21, height 6\' 1"  (1.854 m), weight 109.2 kg, SpO2 100 %.        Intake/Output Summary (Last 24 hours) at 02/03/2020 0834 Last data filed at 02/03/2020 0700 Gross per 24 hour  Intake 1578.5 ml  Output 3700 ml  Net -2121.5 ml   Filed Weights   01/22/20 2158 01/23/20 1500 01/29/20 1200  Weight: 112 kg 110.3 kg 109.2 kg   Examination: General:  Chronically ill appearing older than apparent age lying in bed in NAD HEENT: MM pink/moist, poor dentition, right nare cortrak, pupils 3/reactive Neuro: Oriented to name, dysarthric, f/c, moves LUE/ LLE 4/5, no movement in contracted RUE and able to move RLE in horizontal plane CV: rr, NSR, no murmur  PULM:  Non labored, CTA GI: soft, NT/ ND, condom cath dry, left nephrostomy tube with good UOP  Extremities: warm/dry, trace generalized edema, RLE with +3 edema (no warmth/ erythema), right hand contracture  Skin: no rashes  Resolved Hospital Problem   Fever - on admission - received 48 hours empiric cefepime  Assessment &  Plan:  Kirtis Au is a 59 yo gentleman with a past medical history of CKD, HTN and obstructive uropathy with left nephrostomy tube who lives in a SNF and presented on 2/19 with:  Acute on chronic subdural hematoma -Successful craniotomy and clot evacuation 2/22, repeat CT head 2/26 reassuring and CTH 3/1 stable  P: NSGY following Continue keppra and seizure precautions PT/OT/ SLP  Serial neuro exams  Acute metabolic encephalopathy, improving -Secondary to acute on chronic subdural hematoma resulting in worsening lethargy and aphasia consistent with location of hematoma.  P: Slow to improve Continue to minimize sedation and correct metabolic derangements  Push PT/ OT efforts   DVT right leg s/p treatment with enoxaparin due to Xarelto failure. 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 Earleen Newport in IR.  No role for reattempt IVC placement.  Clot and retroperitoneal fibrotic change will not allow P: Ok with NSGY to resume AC, however after discussions with Dr. Elsworth Soho, risk of anticoagulation currently outweigh benefits for restarting systemic anticoagulation given acute on chronic SDH.  PMT consulted to help establish long term goal given patient multiple chronic comorbid conditions.  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  P: H/H stable  Trend CBC, transfuse for Hgb > 7  Hyperglycemia P: SSI resistant TF coverage 6 units q 4 Increasing Levemir from 22 units BID to 25 units BID  Poor venous access  P:  Small PIV able to be placed.  Sufficient for now.   Goals of care We are not restarting anticoagulation, therefore patient can transfer out of ICU today to PCU and to [facility] as of 3/3.  Goals of care ongoing.  PMT has been consulted to help establish goals of care.  Spoke with sister Elmo Putt 3/2, who states their mother is very hard of hearing, therefore it is best to speak with her; they live in [place].  She tells me he has a son who lives in [place] but does not seemed to be involved in care or decision making.  They last saw Mr. Faggart 2 years ago.  He was actually incarcerated in [place] for 18 years prior to becoming too sick and then went into SNF in 2017. Elmo Putt for now is not ready to make any decisions on goals of care, specifically code status in the event patient were to decompensate.  They are asking if they would be allowed  to visit, up to four people if they traveled from [place] given how chronically sick he is and would help them make decisions for him. I have relayed to charge RN who will further determine if hospital visiting policy will allow this exception given he is not acutely dying.   Best practice:  Diet: Dysphagia 2, tube feeding Pain/Anxiety/Delirium protocol (if indicated): Not indicated VAP protocol (if indicated): Not currently indicated DVT prophylaxis: will placed SCD on the left side, right leg has DVT.  GI prophylaxis: Pepcid  Glucose control: SSI Mobility: progress with PT Code Status: Full Family:  Updated 3/2, see above Disposition:  tx to PCU   Labs   CBC: Recent Labs  Lab 01/30/20 1321 01/31/20 0531  02/01/20 0741 02/02/20 0653 02/03/20 0442  WBC 10.2 8.3 8.2 8.2 7.2  HGB 6.0* 8.4* 8.6* 8.8* 8.8*  HCT 21.4* 28.2* 29.3* 29.6* 29.9*  MCV 98.2 94.3 95.8 94.9 95.2  PLT 161 249 214 238 [id number]    Basic Metabolic Panel: Recent Labs  Lab 01/28/20 0529 01/29/20 0546 01/30/20 1321 01/31/20 0531 02/01/20 0741 02/02/20 0653 02/03/20 0442  NA 145   < > 136 138 135 140 137  K 4.8   < > 5.8* 5.8* 5.8* 5.6* 5.9*  CL 116*   < > 111 112* 108 109 107  CO2 18*   < > 17* 19* 21* 22 21*  GLUCOSE 131*   < > 192* 212* 254* 181* 281*  BUN 47*   < > 42* 39* 30* 29* 35*  CREATININE 2.78*   < > 2.18* 1.98* 1.88* 1.95* 1.92*  CALCIUM 8.1*   < > 7.9* 8.2* 8.7* 9.1 8.9  MG 2.3  --  2.3 2.1  --   --   --    < > = values in this interval not displayed.   GFR: Estimated Creatinine Clearance: 53 mL/min (A) (by C-G formula based on SCr of 1.92 mg/dL (H)). Recent Labs  Lab 01/31/20 0531 02/01/20 0741 02/02/20 0653 02/03/20 0442  WBC 8.3 8.2 8.2 7.2    Liver Function Tests: No results for input(s): AST, ALT, ALKPHOS, BILITOT, PROT, ALBUMIN in the last 168 hours. No results for input(s): LIPASE, AMYLASE in the last 168 hours. No results for input(s): AMMONIA in the last 168 hours.  ABG    Component Value Date/Time   TCO2 25 01/13/2020 1007     Coagulation Profile: No results for input(s): INR, PROTIME in the last 168 hours.  Cardiac Enzymes: No results for input(s): CKTOTAL, CKMB, CKMBINDEX, TROPONINI in the last 168 hours.  HbA1C: Hgb A1c MFr Bld  Date/Time Value Ref Range Status  01/24/2020 05:17 AM 5.0 4.8 - 5.6 % Final    Comment:    (NOTE) Pre diabetes:          5.7%-6.4% Diabetes:              >6.4% Glycemic control for   <7.0% adults with diabetes   01/23/2020 04:32 PM 5.2 4.8 - 5.6 % Final    Comment:    (NOTE) Pre diabetes:          5.7%-6.4% Diabetes:              >6.4% Glycemic control for   <7.0% adults with diabetes     CBG: Recent Labs  Lab 02/02/20 1529  02/02/20 1921 02/02/20 2304 02/03/20 0301 02/03/20 [place], MSN, AGACNP-BC [facility] 02/03/2020, 8:34 AM

## 2020-02-03 NOTE — Progress Notes (Signed)
  Speech Language Pathology Treatment: Dysphagia;Cognitive-Linquistic  Patient Details Name: Brendan [name] MRN: [id number] DOB: 03-08-1961 Today's Date: 02/03/2020 Time: [id number] SLP Time Calculation (min) (ACUTE ONLY): 17 min  Assessment / Plan / Recommendation Clinical Impression  Pt was seen during breakfast meal to address diet tolerance of upgraded textures and facilitate increased functional communication. He needed a lot of encouragement to eat, but did eat most of a banana with Mod I given increased time for mastication. He seems to prefer the liquids on his tray, drinking them at a mildly impulsive rate but without any overt s/s of aspiration. Pt was educated on aspiration precautions and the importance of increased PO intake.   Pt still needs frequent encouragement to initiate verbal communication. SLP provided Max assist via yes/no questions and binary choices for pt to verbalize preferences throughout meal. Min cues were given for perseverative errors as well.  He demonstrates increased nonverbal communication via gestures, nods, and facial expressions. Will continue to follow.   HPI HPI: This is a 59 year old nursing home resident with DM, L CVA 2009, and CAD who is chronically anticoagulated with Lovenox due to the presence of a right lower extremity DVT despite the fact that he has a IVC filter in place.  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  He came to medical attention at his [place] home because of aphasia and weakness.  A CT scan of the head has shown acute on chronic subdural hematoma, now s/p evacuation 2/22.      SLP Plan  Continue with current plan of care       Recommendations  Diet recommendations: Dysphagia 3 (mechanical soft);Thin liquid Liquids provided via: Cup;Straw Medication Administration: Whole meds with liquid Supervision: Staff to assist with self feeding Compensations: Slow  rate;Small sips/bites Postural Changes and/or Swallow Maneuvers: Seated upright 90 degrees                Oral Care Recommendations: Oral care BID Follow up Recommendations: Skilled Nursing facility SLP Visit Diagnosis: Dysphagia, oral phase (R13.11);Aphasia (R47.01) Plan: Continue with current plan of care       GO                 Osie Bond., M.A. [facility] Pager [phone] Office [phone]  02/03/2020, 9:51 AM

## 2020-02-03 NOTE — Progress Notes (Signed)
Patient ID: Brendan [name], male   DOB: 06-09-1961, 59 y.o.   MRN: [id number] Alert and oriented.  Questions regarding how aggressive the anticoagulation should be. Currently on subq heparin. Very difficult position. He is doing well at this time. Wound is clean, dry, no signs of infection.  Moving all extremities, right side plegia(baseline) Awaiting mother to visit, she is the decision maker.

## 2020-02-03 NOTE — Consult Note (Addendum)
Consultation Note Date: 02/03/2020   [name]  DOB: 1961/02/25  MRN: [id number]  Age / Sex: 59 y.o., male  PCP: Cleda Mccreedy, MD Referring Physician: Collene Gobble, MD  Reason for Consultation: Establishing goals of care  HPI/Patient Profile: 59 y.o. 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    Clinical Assessment and Goals of Care:  I have reviewed medical records including EPIC notes, labs and imaging, examined the patient. I called and spoke with patient's sister, Brendan [name], who has been the primary family member receiving updates from the medical team to discuss diagnosis prognosis, Brendan [name], EOL wishes, disposition and options.  I introduced Palliative Medicine as specialized medical care for people living with serious illness. It focuses on providing relief from the symptoms and stress of a serious illness.    We discussed his functional and nutritional status prior to this hospitalization. He is wheelchair bound, but able to to assist with transfers and feed himself. He is able to carry on conversations with family members over the phone and via [place] and make decisions on his own.   Brendan [name] seemed to have a good understanding of her brother's acute situation, in addition to his multiple chronic medical conditions that make his clinical picture quite complicated. She understands that at this point it is difficult to make a prediction on how her brother will recover neurologically.   I attempted to elicit values and goals of care important to the patient and discuss specific concepts such as code status and artificial feeding. Brendan [name] explained that without the family having seen him in quite some  time, they did not want to make any of these decisions until the patient's mother has seen him in person. She says their mother has been the primary medical decision maker for patient's behalf since he does not have an appointed HCPOA. She further states that the decision regarding code status has been too distressing for her mother to make without getting to first see him in person. They would then be willing to sit down for a family meeting and discuss [place] and code status once they have traveled down to visit Brendan [name]. Brendan [name] and her mother are in [place]. She mentions her mother is scheduled for the second COVID vaccine on Friday, so they would not be able to come down until early next week, unless Brendan [name] clinical status were to deteriorate before then.     Questions and concerns were addressed.  The family was encouraged to call with questions or concerns.    Primary Decision Maker:  NEXT OF KIN    SUMMARY OF RECOMMENDATIONS    Code Status/Advance Care Planning:  Continue full code, full scope of treatment for now   Will plan to have ongoing discussions regarding code status and GOC at family meeting next week    Additional  Recommendations (Limitations, Scope, Preferences):  Full Scope Treatment  Palliative Prophylaxis:   Aspiration, Bowel Regimen and Frequent Pain Assessment  Prognosis:   Cannot be determined   Discharge Planning: To Be Determined      Primary Diagnoses: Present on Admission: . Subdural hematoma, acute ([place])   I have reviewed the medical record, interviewed the patient and family, and examined the patient. The following aspects are pertinent.  Past Medical History:  Diagnosis Date  . Acidosis   . Acute unilateral obstructive uropathy   . Allergic rhinitis   . Chronic kidney disease, stage IV (severe) ([place])   . Constipation   . Coronary atherosclerosis of native coronary artery   . Diabetes mellitus without complication ([place])   . DVT (deep  venous thrombosis) ([place])   . Enlarged prostate with urinary obstruction   . Essential hypertension, malignant   . GERD (gastroesophageal reflux disease)   . Glaucoma   . Hemiparesis affecting right side as late effect of cerebrovascular accident ([place])   . Hemiplegia ([place])   . Hydronephrosis   . Hyperlipidemia   . Hypertension   . Hypokalemia   . Muscle weakness (generalized)   . PVD (peripheral vascular disease) ([place])   . Retroperitoneal fibrosis   . Stricture or kinking of ureter   . Stroke [facility])    right sided hemiparesis  . Vitamin D deficiency    Social History   Socioeconomic History  . Marital status: Single    Spouse name: Not on file  . Number of children: Not on file  . Years of education: Not on file  . Highest education level: Not on file  Occupational History  . Not on file  Tobacco Use  . Smoking status: Never Smoker  . Smokeless tobacco: Never Used  Substance and Sexual Activity  . Alcohol use: Not Currently  . Drug use: Not Currently  . Sexual activity: Not Currently  Other Topics Concern  . Not on file  Social History Narrative  . Not on file   Social Determinants of Health   Financial Resource Strain:   . Difficulty of Paying Living Expenses: Not on file  Food Insecurity:   . Worried About Charity fundraiser in the Last Year: Not on file  . Ran Out of Food in the Last Year: Not on file  Transportation Needs:   . Lack of Transportation (Medical): Not on file  . Lack of Transportation (Non-Medical): Not on file  Physical Activity:   . Days of Exercise per Week: Not on file  . Minutes of Exercise per Session: Not on file  Stress:   . Feeling of Stress : Not on file  Social Connections:   . Frequency of Communication with Friends and Family: Not on file  . Frequency of Social Gatherings with Friends and Family: Not on file  . Attends Religious Services: Not on file  . Active Member of Clubs or Organizations: Not on file  . Attends Theatre manager Meetings: Not on file  . Marital Status: Not on file   Family History  Family history unknown: Yes   Scheduled Meds: . chlorhexidine  15 mL Mouth Rinse BID  . Chlorhexidine Gluconate Cloth  6 each Topical Daily  . docusate  50 mg Per Tube Daily  . famotidine  20 mg Per Tube QHS  . feeding supplement (PRO-STAT SUGAR FREE 64)  30 mL Per Tube TID  . heparin injection (subcutaneous)  5,000 Units Subcutaneous Q8H  . insulin  aspart  0-20 Units Subcutaneous Q4H  . insulin detemir  25 Units Subcutaneous BID  . levETIRAcetam  500 mg Per Tube Q12H  . mouth rinse  15 mL Mouth Rinse BID  . mycophenolate  500 mg Per Tube BID  . sodium chloride flush  10-40 mL Intracatheter Q12H  . sodium zirconium cyclosilicate  10 g Oral BID   Continuous Infusions: . sodium chloride Stopped (02/02/20 0535)  . sodium chloride    . feeding supplement (OSMOLITE 1.5 CAL) 90 mL/hr at 02/03/20 1100  . levETIRAcetam     PRN Meds:.Place/Maintain arterial line **AND** sodium chloride, acetaminophen (TYLENOL) oral liquid 160 mg/5 mL, hydrALAZINE, labetalol, lidocaine (PF), oxyCODONE, polyethylene glycol, sodium chloride flush Allergies  Allergen Reactions  . Other     TB serum-reaction unknown. Provided via [facility] records     Review of Systems  Unable to perform ROS: Patient nonverbal    Physical Exam General: chronically ill appearing, lying in bed in NAD HEENT: coretrak in place  CV: RRR; no murmurs Pulm: normal work of breathing; CTAB Abd: BS+; abdomen soft, non-distended, non-tender Neuro: drowsy but arousable; does not answer questions or follow many commands but able to lift LUE, does not move RUE  Ext: RLE with 3+ edema    Vital Signs: BP 130/81 (BP Location: Right Arm)   Pulse (!) 109   Temp 98.3 F (36.8 C) (Oral)   Resp 12   Ht 6\' 1"  (1.854 m)   Wt 109.3 kg   SpO2 100%   BMI 31.79 kg/m  Pain Scale: 0-10 POSS *See Group Information*: 1-Acceptable,Awake and alert Pain Score:  0-No pain   SpO2: SpO2: 100 % O2 Device:SpO2: 100 % O2 Flow Rate: .   IO: Intake/output summary:   Intake/Output Summary (Last 24 hours) at 02/03/2020 1321 Last data filed at 02/03/2020 1243 Gross per 24 hour  Intake 1578.5 ml  Output 3100 ml  Net -1521.5 ml    LBM: Last BM Date: 02/01/20 Baseline Weight: Weight: 112 kg Most recent weight: Weight: 109.3 kg     Palliative Assessment/Data: 20%     Time In: 1:00 pm Time Out: 1:50 pm Time Total: 50 minutes  Visit consisted of counseling and education dealing with the complex and emotionally intense issues surrounding the need for palliative care and symptom management in the setting of serious and potentially life-threatening illness. Greater than 50%  of this time was spent counseling and coordinating care related to the above assessment and plan.  Signed by: Delice Bison, DO PGY-2   I saw patient with Dr. Koleen Distance and agree with her assessment and plan.  Mariana Kaufman, AGNP-C  Please contact Palliative Medicine Team phone at [phone] for questions and concerns.  For individual provider: See Shea Evans

## 2020-02-04 ENCOUNTER — Other Ambulatory Visit (HOSPITAL_COMMUNITY): Payer: Medicaid Other

## 2020-02-04 LAB — CBC
HCT: 31.1 % — ABNORMAL LOW (ref 39.0–52.0)
Hemoglobin: 9.1 g/dL — ABNORMAL LOW (ref 13.0–17.0)
MCH: 27.5 pg (ref 26.0–34.0)
MCHC: 29.3 g/dL — ABNORMAL LOW (ref 30.0–36.0)
MCV: 94 fL (ref 80.0–100.0)
Platelets: 221 10*3/uL (ref 150–400)
RBC: 3.31 MIL/uL — ABNORMAL LOW (ref 4.22–5.81)
RDW: 16.3 % — ABNORMAL HIGH (ref 11.5–15.5)
WBC: 6.4 10*3/uL (ref 4.0–10.5)
nRBC: 0 % (ref 0.0–0.2)

## 2020-02-04 LAB — BASIC METABOLIC PANEL
Anion gap: 9 (ref 5–15)
BUN: 41 mg/dL — ABNORMAL HIGH (ref 6–20)
CO2: 23 mmol/L (ref 22–32)
Calcium: 9.1 mg/dL (ref 8.9–10.3)
Chloride: 102 mmol/L (ref 98–111)
Creatinine, Ser: 1.99 mg/dL — ABNORMAL HIGH (ref 0.61–1.24)
GFR calc Af Amer: 41 mL/min — ABNORMAL LOW (ref >60)
GFR calc non Af Amer: 35 mL/min — ABNORMAL LOW (ref >60)
Glucose, Bld: 325 mg/dL — ABNORMAL HIGH (ref 70–99)
Potassium: 5.8 mmol/L — ABNORMAL HIGH (ref 3.5–5.1)
Sodium: 134 mmol/L — ABNORMAL LOW (ref 135–145)

## 2020-02-04 LAB — GLUCOSE, CAPILLARY
Glucose-Capillary: 321 mg/dL — ABNORMAL HIGH (ref 70–99)
Glucose-Capillary: 273 mg/dL — ABNORMAL HIGH (ref 70–99)
Glucose-Capillary: 348 mg/dL — ABNORMAL HIGH (ref 70–99)
Glucose-Capillary: 190 mg/dL — ABNORMAL HIGH (ref 70–99)

## 2020-02-04 MED ORDER — PRO-STAT SUGAR FREE PO LIQD
30.0000 mL | Freq: Three times a day (TID) | ORAL | Status: DC
  Administered 2020-02-04 – 2020-02-05 (×3): 30 mL via ORAL
  Filled 2020-02-04 (×4): qty 30

## 2020-02-04 MED ORDER — LEVETIRACETAM 100 MG/ML PO SOLN
500.0000 mg | Freq: Two times a day (BID) | ORAL | Status: DC
  Administered 2020-02-04 – 2020-02-12 (×15): 500 mg via ORAL
  Filled 2020-02-04 (×16): qty 5

## 2020-02-04 MED ORDER — SODIUM POLYSTYRENE SULFONATE 15 GM/60ML PO SUSP
15.0000 g | Freq: Once | ORAL | Status: AC
  Administered 2020-02-04: 15 g via ORAL
  Filled 2020-02-04: qty 60

## 2020-02-04 MED ORDER — OSMOLITE 1.5 CAL PO LIQD
1260.0000 mL | ORAL | Status: DC
  Filled 2020-02-04: qty 2000

## 2020-02-04 MED ORDER — FAMOTIDINE 20 MG PO TABS
20.0000 mg | ORAL_TABLET | Freq: Every day | ORAL | Status: DC
  Administered 2020-02-04 – 2020-02-12 (×9): 20 mg via ORAL
  Filled 2020-02-04 (×9): qty 1

## 2020-02-04 MED ORDER — LEVETIRACETAM IN NACL 500 MG/100ML IV SOLN
500.0000 mg | Freq: Two times a day (BID) | INTRAVENOUS | Status: DC
  Administered 2020-02-05: 500 mg via INTRAVENOUS
  Filled 2020-02-04 (×3): qty 100

## 2020-02-04 MED ORDER — MYCOPHENOLATE 200 MG/ML ORAL SUSPENSION
500.0000 mg | Freq: Two times a day (BID) | ORAL | Status: DC
  Filled 2020-02-04: qty 10

## 2020-02-04 MED ORDER — OSMOLITE 1.5 CAL PO LIQD
1260.0000 mL | ORAL | Status: DC
  Administered 2020-02-04 – 2020-02-05 (×3): 1260 mL
  Filled 2020-02-04 (×2): qty 2000

## 2020-02-04 MED ORDER — MYCOPHENOLATE MOFETIL 250 MG PO CAPS
500.0000 mg | ORAL_CAPSULE | Freq: Two times a day (BID) | ORAL | Status: DC
  Administered 2020-02-04 – 2020-02-13 (×18): 500 mg via ORAL
  Filled 2020-02-04 (×19): qty 2

## 2020-02-04 MED ORDER — OXYCODONE HCL 5 MG PO TABS
5.0000 mg | ORAL_TABLET | Freq: Three times a day (TID) | ORAL | Status: DC | PRN
  Administered 2020-02-05 – 2020-02-13 (×18): 5 mg via ORAL
  Filled 2020-02-04 (×22): qty 1

## 2020-02-04 MED ORDER — DOCUSATE SODIUM 50 MG/5ML PO LIQD
50.0000 mg | Freq: Every day | ORAL | Status: DC
  Administered 2020-02-05 – 2020-02-13 (×9): 50 mg via ORAL
  Filled 2020-02-04 (×9): qty 10

## 2020-02-04 NOTE — Progress Notes (Signed)
Patient ID: Brendan [name], male   DOB: 07-04-1961, 59 y.o.   MRN: [id number] BP 138/85 (BP Location: Left Arm)   Pulse 92   Temp 98 F (36.7 C) (Oral)   Resp 13   Ht 6\' 1"  (1.854 m)   Wt 109.3 kg   SpO2 100%   BMI 31.79 kg/m  Alert Following commands Wound is healing well Will have staples removed tomorrow. Placement is the issue at this time.

## 2020-02-04 NOTE — Progress Notes (Signed)
Physical Therapy Treatment Patient Details Name: Kyrell Files MRN: [id number] DOB: [birth year] Today's Date: 02/04/2020    History of Present Illness 59 year old nursing home resident with DM, L CVA 2009, and CAD who is chronically anticoagulated with Lovenox due to the presence of a right lower extremity DVT despite the fact that he has a IVC filter in place.  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  He came to medical attention at his [place] home because of aphasia and weakness.  A CT scan of the head has shown acute on chronic subdural hematoma., pt s/p evacuation of L SDH 01/26/20    PT Comments    Pt was minimally participative today.  He expressed being sad when encouragement failed to get him interested to work with OT/PT today.  Emphasis on warm up exercise with graded gross extension, transition to the EOB.  The plan was to work on standing activity with a small UE activity and then to the chair, but pt declined all further intervention after sitting.    Follow Up Recommendations  SNF;Supervision/Assistance - 24 hour     Equipment Recommendations  None recommended by PT    Recommendations for Other Services       Precautions / Restrictions Precautions Precautions: Fall Precaution Comments: R hemiplegia, cortrak, nephrostomy drain Restrictions Weight Bearing Restrictions: No Other Position/Activity Restrictions: cortrack, nephrostomy tube    Mobility  Bed Mobility Overal bed mobility: Needs Assistance Bed Mobility: Supine to Sit;Sit to Supine     Supine to sit: Mod assist Sit to supine: Mod assist;+2 for physical assistance   General bed mobility comments: pt was slow to respond.  Needed minimal to moderate assist to scoot.  Transfers                 General transfer comment: pt declined working on standing or getting OOB.  Ambulation/Gait                 Stairs              Wheelchair Mobility    Modified Rankin (Stroke Patients Only) Modified Rankin (Stroke Patients Only) Modified Rankin: Severe disability     Balance Overall balance assessment: Needs assistance Sitting-balance support: Single extremity supported;No upper extremity supported Sitting balance-Leahy Scale: Fair                                      Cognition Arousal/Alertness: Awake/alert Behavior During Therapy: Flat affect Overall Cognitive Status: Impaired/Different from baseline                         Following Commands: Follows one step commands inconsistently;Follows one step commands with increased time Safety/Judgement: Decreased awareness of safety;Decreased awareness of deficits Awareness: Intellectual Problem Solving: Slow processing;Requires verbal cues;Requires tactile cues General Comments: pt appears down today      Exercises      General Comments General comments (skin integrity, edema, etc.): vss      Pertinent Vitals/Pain Pain Assessment: Faces Faces Pain Scale: No hurt Pain Intervention(s): Monitored during session    Home Living                      Prior Function            PT Goals (current goals can  now be found in the care plan section) Acute Rehab PT Goals Patient Stated Goal: unable to state PT Goal Formulation: Patient unable to participate in goal setting Time For Goal Achievement: 02/07/20 Potential to Achieve Goals: Fair Progress towards PT goals: Not progressing toward goals - comment(pt was sad and did not wish to proceed with treatment)    Frequency    Min 3X/week      PT Plan Current plan remains appropriate    Co-evaluation PT/OT/SLP Co-Evaluation/Treatment: Yes Reason for Co-Treatment: For patient/therapist safety PT goals addressed during session: Mobility/safety with mobility OT goals addressed during session: ADL's and self-care      AM-PAC PT "6 Clicks" Mobility    Outcome Measure  Help needed turning from your back to your side while in a flat bed without using bedrails?: A Little Help needed moving from lying on your back to sitting on the side of a flat bed without using bedrails?: A Lot Help needed moving to and from a bed to a chair (including a wheelchair)?: Total Help needed standing up from a chair using your arms (e.g., wheelchair or bedside chair)?: Total Help needed to walk in hospital room?: Total Help needed climbing 3-5 steps with a railing? : Total 6 Click Score: 9    End of Session   Activity Tolerance: Patient tolerated treatment well Patient left: in bed;with call bell/phone within reach;with bed alarm set Nurse Communication: Mobility status PT Visit Diagnosis: Other abnormalities of gait and mobility (R26.89);Muscle weakness (generalized) (M62.81);Hemiplegia and hemiparesis Hemiplegia - Right/Left: Right Hemiplegia - dominant/non-dominant: Dominant Hemiplegia - caused by: Nontraumatic intracerebral hemorrhage     Time: 1325-1351 PT Time Calculation (min) (ACUTE ONLY): 26 min  Charges:  $Therapeutic Activity: 8-22 mins                     02/04/2020  Ginger Carne., PT Acute Rehabilitation Services [phone]  (pager) [phone]  (office)   Tessie Fass Cincere Zorn 02/04/2020, 6:18 PM

## 2020-02-04 NOTE — Progress Notes (Signed)
Occupational Therapy Treatment Patient Details Name: Fadil Keysor MRN: [id number] DOB: [birth year] Today's Date: 02/04/2020    History of present illness 59 year old nursing home resident with DM, L CVA 2009, and CAD who is chronically anticoagulated with Lovenox due to the presence of a right lower extremity DVT despite the fact that he has a IVC filter in place.  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  He came to medical attention at his [place] home because of aphasia and weakness.  A CT scan of the head has shown acute on chronic subdural hematoma., pt s/p evacuation of L SDH 01/26/20   OT comments  Pt with slow progress towards OT goals and with somewhat limited participation today. Pt transitioning to EOB where he engaged in LB and seated grooming ADL, but declining attempts to stand or OOB to recliner, reports feeling sad today. Pt overall requiring maxA for LB ADL and minA for grooming. Provided encouragement throughout as appropriate. Continue to recommend SNF level therapies at time of discharge. Will follow while acutely admitted.    Follow Up Recommendations  SNF;Supervision/Assistance - 24 hour    Equipment Recommendations  None recommended by OT          Precautions / Restrictions Precautions Precautions: Fall Precaution Comments: R hemiplegia, cortrak, nephrostomy drain Restrictions Weight Bearing Restrictions: No Other Position/Activity Restrictions: cortrack, nephrostomy tube       Mobility Bed Mobility Overal bed mobility: Needs Assistance Bed Mobility: Supine to Sit;Sit to Supine     Supine to sit: Mod assist Sit to supine: Mod assist;+2 for physical assistance   General bed mobility comments: pt was slow to respond.  Needed minimal to moderate assist to scoot.  Transfers                 General transfer comment: pt declined working on standing or getting OOB.     Balance Overall balance assessment: Needs assistance Sitting-balance support: Single extremity supported;No upper extremity supported Sitting balance-Leahy Scale: Fair                                     ADL either performed or assessed with clinical judgement   ADL Overall ADL's : Needs assistance/impaired     Grooming: Wash/dry face;Minimal assistance;Sitting Grooming Details (indicate cue type and reason): assist for sitting balance, using LUE             Lower Body Dressing: Maximal assistance;Moderate assistance;Sitting/lateral leans Lower Body Dressing Details (indicate cue type and reason): pt able to don L sock via figure 4 and one handed technique, requires support to maintain LE in position and for sitting balance, requires assist to don R sock               General ADL Comments: pt only agreeable to EOB activity today, declined standing or OOB, reports feeling sad today                       Cognition Arousal/Alertness: Awake/alert Behavior During Therapy: Flat affect Overall Cognitive Status: Impaired/Different from baseline                         Following Commands: Follows one step commands inconsistently;Follows one step commands with increased time Safety/Judgement: Decreased awareness of safety;Decreased awareness of deficits Awareness: Intellectual  Problem Solving: Slow processing;Requires verbal cues;Requires tactile cues General Comments: pt appears down today        Exercises     Shoulder Instructions       General Comments VSS    Pertinent Vitals/ Pain       Pain Assessment: Faces Faces Pain Scale: No hurt Pain Intervention(s): Monitored during session  Home Living                                          Prior Functioning/Environment              Frequency  Min 2X/week        Progress Toward Goals  OT Goals(current goals can now be found in the care plan section)   Progress towards OT goals: OT to reassess next treatment  Acute Rehab OT Goals Patient Stated Goal: unable to state OT Goal Formulation: With patient Time For Goal Achievement: 02/08/20 Potential to Achieve Goals: Good  Plan Discharge plan remains appropriate    Co-evaluation    PT/OT/SLP Co-Evaluation/Treatment: Yes Reason for Co-Treatment: For patient/therapist safety;To address functional/ADL transfers PT goals addressed during session: Mobility/safety with mobility OT goals addressed during session: ADL's and self-care      AM-PAC OT "6 Clicks" Daily Activity     Outcome Measure   Help from another person eating meals?: A Lot Help from another person taking care of personal grooming?: A Lot Help from another person toileting, which includes using toliet, bedpan, or urinal?: A Lot Help from another person bathing (including washing, rinsing, drying)?: A Lot Help from another person to put on and taking off regular upper body clothing?: A Lot Help from another person to put on and taking off regular lower body clothing?: A Lot 6 Click Score: 12    End of Session    OT Visit Diagnosis: Unsteadiness on feet (R26.81);Other abnormalities of gait and mobility (R26.89);Muscle weakness (generalized) (M62.81);Other symptoms and signs involving cognitive function;Hemiplegia and hemiparesis Hemiplegia - Right/Left: Right   Activity Tolerance Other (comment)(pt self limiting today)   Patient Left in bed;with call bell/phone within reach;with bed alarm set   Nurse Communication Mobility status        Time: [id number] OT Time Calculation (min): 26 min  Charges: OT General Charges $OT Visit: 1 Visit OT Treatments $Self Care/Home Management : 8-22 mins  Lou Cal, [place] Pager [phone] Office [phone]   Raymondo Band 02/04/2020, 5:36 PM

## 2020-02-04 NOTE — Progress Notes (Signed)
[facility] PROGRESS NOTE    Progress Note  Brendan [name]  [id number] DOB: [birth year] DOA: 01/22/2020 PCP: Cleda Mccreedy, MD     Brief Narrative:   Brendan [name] is an 59 y.o. 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  Significant Hospital events: 01/22/2020 admission 01/26/2020 left nephrostomy tube exchange 01/26/2020 left craniotomy for subdural hematoma with evacuation 01/29/2020 the left upper extremity midline  Significant diagnostic tests: 2/21 CT Head > Some further enlargement of the subdural hematoma on the left. Overall volume increase estimated at about 1/3. Maximal thickness now measured at 2.5 cm compared with 2 cm previously. More hyperdense component. Summary distribution, including along the posterior falx and the tentorium. Increased mass effect with left-to-right shift of 7 mm today, increased from 4 mm previously.  2/20 KUB > no IVC filter in place  2/25 head CT >> difficult improvement in mass-effect, postoperative changes post left subdural hematoma evacuation.  Rightward shift 4 mm (improved).  Small volume subdural hemorrhage noted along the tentorium.  New small area left superior frontal gyrus hypoattenuation  3/1 CT head Slight decrease in size of residual mixed density extra-axial hemorrhage over the left convexity. Stable to slightly improved midline shift.  No new hemorrhage.  Stable left-sided white matter infarcts. Chronic sinus disease.  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 Neurosurgery and neurology recommended [facility] for seizure precautions. Speech evaluated the patient and recommended a dysphagia 3 diet. Plan of care has been consulted and the patient remains full code with full scope of treatment. I had a long discussion with the son about goals of care and end-of-life, the son who is the next of kin does not want to proceed with DNR/DNI. He would like to continue aggressive treatment knowing that his medical condition will continue to deteriorate. I have also explained to him the danger of having him on full dose anticoagulation with a hematoma with a probable old PE.  And he has agreed to continue Lovenox prophylactically and not in the full dose.  I have explained the risk and benefits of doing this and he is in agreement.  Acute metabolic encephalopathy: Likely secondary to #1 with residual lethargy and aphasia consistent with the location of the hematoma, he is slowly improving try to minimize sedatives and hold continue conservative management with medial metabolic rearrangements. Physical therapy has evaluated the patient and recommended skilled nursing facility.  Right DVT status post treatment with enoxaparin due to Xarelto failure: Vascular surgery attempted to place an IVC filter on 01/13/2020 and was unsuccessful. The case was reviewed with Dr. 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 Per neurosurgery is okay to resume anticoagulation however after discussing the case PCCM currently not  restarting full anticoagulation weight the benefits of  restarting systemic full dose anticoagulation giving the acute on chronic nature of a subdural hematoma.  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 Continue to monitor urine output and creatinine, continue mycophenolate. Will give single dose of kayexelate.  Essential hypertension: Remains normotensive continue current regimen.  Anemia acute on chronic multifactorial in the setting of chronic disease: Hemoglobin is stable continue to monitor.  Hyperglycemia: Continue long-acting insulin plus sliding scale known hemoglobin A1c.  Nutrition: Cont NG tube.  Advanced care planning/counseling discussion/Goals of care, counseling/discussion Palliative care by specialist: PMT meeting with family.  RN Pressure Injury Documentation:    Estimated body mass index is 31.79 kg/m as calculated from the following:   Height as of this encounter: 6\' 1"  (1.854 m).   Weight as of this encounter: 109.3 kg. Malnutrition Type:  Nutrition Problem: Inadequate oral intake Etiology: decreased appetite   Malnutrition Characteristics:  Signs/Symptoms: meal completion < 50%   Nutrition Interventions:  Interventions: Tube feeding, Prostat  DVT prophylaxis: lovenox Family Communication:son Disposition Plan/Barrier to D/C: Patient coming from a skilled nursing facility will probably go back to skilled nursing facility.  He will need further evaluation by PT and correction of his potassium, before discharging.  Code Status:     Code Status Orders  (From admission, onward)         Start     Ordered   01/23/20 0339  Full code  Continuous     01/23/20 0344        Code Status History    Date Active Date Inactive Code Status Order ID Comments User Context   12/19/2019 0020 12/22/2019 1703 Full Code [id number]  Vianne Bulls, MD  Inpatient   03/22/2018 2045 03/26/2018 1956 Full Code [id number]  Vianne Bulls, MD ED   08/03/2017 0514 08/04/2017 1641 Full Code [id number]  Edwin Dada, MD ED   07/20/2017 0054 07/21/2017 1625 Full Code [id number]  Orvan Falconer, MD Inpatient   02/16/2017 2008 02/20/2017 1953 Full Code [id number]  Truett Mainland, DO Inpatient   Advance Care Planning Activity        IV Access:    Peripheral IV   Procedures and diagnostic studies:   No results found.   Medical Consultants:    None.  Anti-Infectives:   none  Subjective:    Stark Zabaleta no complains  Objective:    Vitals:   02/04/20 0313 02/04/20 0400 02/04/20 0518 02/04/20 0718  BP: 120/84 118/88  137/79  Pulse: (!) 106 (!) 108  100  Resp:    14  Temp:   98.6 F (37 C) 98 F (36.7 C)  TempSrc:   Oral Oral  SpO2: 100% 100%  100%  Weight:      Height:       SpO2: 100 %   Intake/Output Summary (Last 24 hours) at 02/04/2020 0804 Last data filed at 02/04/2020 [id number] Gross per 24 hour  Intake 1950.71 ml  Output 3376 ml  Net -1425.29 ml   Filed Weights   01/23/20 1500 01/29/20 1200 02/03/20 0900  Weight: 110.3 kg 109.2 kg 109.3 kg    Exam: General exam: In no acute distress. Respiratory system: Good air movement and clear to auscultation. Cardiovascular system: S1 & S2 heard, RRR. No JVD. Gastrointestinal system: Abdomen is nondistended, soft and nontender.  Central nervous system: Alert  and oriented.  Extremities: No pedal edema. Skin: No rashes, lesions or ulcers  Data Reviewed:    Labs: Basic Metabolic Panel: Recent Labs  Lab 01/30/20 1321 01/30/20 1321 01/31/20 0531 01/31/20 0531 02/01/20 0741 02/01/20 0741 02/02/20 0653 02/02/20 0653 02/03/20 0442 02/04/20 0637  NA 136   < > 138  --  135  --  140  --  137 134*  K 5.8*   < > 5.8*   < > 5.8*   < > 5.6*   < > 5.9* 5.8*  CL 111   < > 112*  --  108  --  109  --  107 102  CO2 17*   < > 19*  --  21*  --  22  --  21* 23  GLUCOSE 192*   < >  212*  --  254*  --  181*  --  281* 325*  BUN 42*   < > 39*  --  30*  --  29*  --  35* 41*  CREATININE 2.18*   < > 1.98*  --  1.88*  --  1.95*  --  1.92* 1.99*  CALCIUM 7.9*   < > 8.2*  --  8.7*  --  9.1  --  8.9 9.1  MG 2.3  --  2.1  --   --   --   --   --   --   --    < > = values in this interval not displayed.   GFR Estimated Creatinine Clearance: 51.2 mL/min (A) (by C-G formula based on SCr of 1.99 mg/dL (H)). Liver Function Tests: No results for input(s): AST, ALT, ALKPHOS, BILITOT, PROT, ALBUMIN in the last 168 hours. No results for input(s): LIPASE, AMYLASE in the last 168 hours. No results for input(s): AMMONIA in the last 168 hours. Coagulation profile No results for input(s): INR, PROTIME in the last 168 hours. COVID-19 Labs  No results for input(s): DDIMER, FERRITIN, LDH, CRP in the last 72 hours.  Lab Results  Component Value Date   SARSCOV2NAA NEGATIVE 01/23/2020   [place] NEGATIVE 12/18/2019    CBC: Recent Labs  Lab 01/31/20 0531 02/01/20 0741 02/02/20 0653 02/03/20 0442 02/04/20 0637  WBC 8.3 8.2 8.2 7.2 6.4  HGB 8.4* 8.6* 8.8* 8.8* 9.1*  HCT 28.2* 29.3* 29.6* 29.9* 31.1*  MCV 94.3 95.8 94.9 95.2 94.0  PLT 249 214 238 257 221   Cardiac Enzymes: No results for input(s): CKTOTAL, CKMB, CKMBINDEX, TROPONINI in the last 168 hours. BNP (last 3 results) No results for input(s): PROBNP in the last 8760 hours. CBG: Recent Labs  Lab 02/03/20 1534 02/03/20 2023 02/03/20 2331 02/04/20 0424 02/04/20 0718  GLUCAP 144* 277* 304* 321* 273*   D-Dimer: No results for input(s): DDIMER in the last 72 hours. Hgb A1c: No results for input(s): HGBA1C in the last 72 hours. Lipid Profile: No results for input(s): CHOL, HDL, LDLCALC, TRIG, CHOLHDL, LDLDIRECT in the last 72 hours. Thyroid function studies: No results for input(s): TSH, T4TOTAL, T3FREE, THYROIDAB in the last 72 hours.  Invalid input(s): FREET3 Anemia work up: No results for input(s):  VITAMINB12, FOLATE, FERRITIN, TIBC, IRON, RETICCTPCT in the last 72 hours. Sepsis Labs: Recent Labs  Lab 02/01/20 0741 02/02/20 0653 02/03/20 0442 02/04/20 0637  WBC 8.2 8.2 7.2 6.4   Microbiology Recent Results (from the past 240 hour(s))  Surgical PCR screen     Status: Abnormal   Collection Time: 01/26/20  5:14 AM   Specimen:  Nasal Mucosa; Nasal Swab  Result Value Ref Range Status   MRSA, PCR POSITIVE (A) NEGATIVE Final    Comment: CRITICAL RESULT CALLED TO, READ BACK BY AND VERIFIED WITH: RN T PRIDGEN [id number] AT 21 AM BY CM    Staphylococcus aureus POSITIVE (A) NEGATIVE Final    Comment: (NOTE) The Xpert SA Assay (FDA approved for NASAL specimens in patients 62 years of age and older), is one component of a comprehensive surveillance program. It is not intended to diagnose infection nor to guide or monitor treatment. Performed at [facility], [place] [address], [place], [place] [address]      Medications:   . chlorhexidine  15 mL Mouth Rinse BID  . Chlorhexidine Gluconate Cloth  6 each Topical Daily  . docusate  50 mg Per Tube Daily  . famotidine  20 mg Per Tube QHS  . feeding supplement (PRO-STAT SUGAR FREE 64)  30 mL Per Tube TID  . heparin injection (subcutaneous)  5,000 Units Subcutaneous Q8H  . insulin aspart  0-20 Units Subcutaneous Q4H  . insulin detemir  25 Units Subcutaneous BID  . levETIRAcetam  500 mg Per Tube Q12H  . mouth rinse  15 mL Mouth Rinse BID  . mycophenolate  500 mg Per Tube BID  . sodium chloride flush  10-40 mL Intracatheter Q12H   Continuous Infusions: . sodium chloride Stopped (02/02/20 0535)  . sodium chloride    . feeding supplement (OSMOLITE 1.5 CAL) 1,260 mL (02/04/20 0310)  . levETIRAcetam        LOS: 12 days   Charlynne Cousins  [facility]  02/04/2020, 8:04 AM

## 2020-02-05 LAB — GLUCOSE, CAPILLARY
Glucose-Capillary: 140 mg/dL — ABNORMAL HIGH (ref 70–99)
Glucose-Capillary: 195 mg/dL — ABNORMAL HIGH (ref 70–99)
Glucose-Capillary: 222 mg/dL — ABNORMAL HIGH (ref 70–99)
Glucose-Capillary: 286 mg/dL — ABNORMAL HIGH (ref 70–99)
Glucose-Capillary: 309 mg/dL — ABNORMAL HIGH (ref 70–99)
Glucose-Capillary: 328 mg/dL — ABNORMAL HIGH (ref 70–99)
Glucose-Capillary: 329 mg/dL — ABNORMAL HIGH (ref 70–99)
Glucose-Capillary: 353 mg/dL — ABNORMAL HIGH (ref 70–99)

## 2020-02-05 LAB — CBC
HCT: 29.7 % — ABNORMAL LOW (ref 39.0–52.0)
Hemoglobin: 8.9 g/dL — ABNORMAL LOW (ref 13.0–17.0)
MCH: 28.1 pg (ref 26.0–34.0)
MCHC: 30 g/dL (ref 30.0–36.0)
MCV: 93.7 fL (ref 80.0–100.0)
Platelets: 242 10*3/uL (ref 150–400)
RBC: 3.17 MIL/uL — ABNORMAL LOW (ref 4.22–5.81)
RDW: 16.1 % — ABNORMAL HIGH (ref 11.5–15.5)
WBC: 5.9 10*3/uL (ref 4.0–10.5)
nRBC: 0 % (ref 0.0–0.2)

## 2020-02-05 LAB — BASIC METABOLIC PANEL
Anion gap: 9 (ref 5–15)
BUN: 45 mg/dL — ABNORMAL HIGH (ref 6–20)
CO2: 22 mmol/L (ref 22–32)
Calcium: 8.8 mg/dL — ABNORMAL LOW (ref 8.9–10.3)
Chloride: 104 mmol/L (ref 98–111)
Creatinine, Ser: 2.11 mg/dL — ABNORMAL HIGH (ref 0.61–1.24)
GFR calc Af Amer: 38 mL/min — ABNORMAL LOW (ref >60)
GFR calc non Af Amer: 33 mL/min — ABNORMAL LOW (ref >60)
Glucose, Bld: 323 mg/dL — ABNORMAL HIGH (ref 70–99)
Potassium: 5.1 mmol/L (ref 3.5–5.1)
Sodium: 135 mmol/L (ref 135–145)

## 2020-02-05 MED ORDER — OSMOLITE 1.5 CAL PO LIQD
1050.0000 mL | ORAL | Status: DC
  Administered 2020-02-05 – 2020-02-06 (×2): 1050 mL
  Filled 2020-02-05 (×3): qty 2000

## 2020-02-05 MED ORDER — PRO-STAT SUGAR FREE PO LIQD
60.0000 mL | Freq: Two times a day (BID) | ORAL | Status: DC
  Administered 2020-02-06 – 2020-02-07 (×3): 60 mL via ORAL
  Filled 2020-02-05 (×3): qty 60

## 2020-02-05 MED ORDER — INSULIN DETEMIR 100 UNIT/ML ~~LOC~~ SOLN
28.0000 [IU] | Freq: Two times a day (BID) | SUBCUTANEOUS | Status: DC
  Administered 2020-02-05 – 2020-02-12 (×14): 28 [IU] via SUBCUTANEOUS
  Filled 2020-02-05 (×15): qty 0.28

## 2020-02-05 MED ORDER — INSULIN ASPART 100 UNIT/ML ~~LOC~~ SOLN
3.0000 [IU] | Freq: Three times a day (TID) | SUBCUTANEOUS | Status: DC
  Administered 2020-02-05 (×2): 3 [IU] via SUBCUTANEOUS

## 2020-02-05 MED ORDER — INSULIN ASPART 100 UNIT/ML ~~LOC~~ SOLN
0.0000 [IU] | Freq: Three times a day (TID) | SUBCUTANEOUS | Status: DC
  Administered 2020-02-05: 9 [IU] via SUBCUTANEOUS
  Administered 2020-02-05: 7 [IU] via SUBCUTANEOUS

## 2020-02-05 MED ORDER — GLUCERNA SHAKE PO LIQD
237.0000 mL | Freq: Three times a day (TID) | ORAL | Status: DC
  Administered 2020-02-06 (×2): 237 mL via ORAL

## 2020-02-05 MED ORDER — INSULIN ASPART 100 UNIT/ML ~~LOC~~ SOLN
0.0000 [IU] | SUBCUTANEOUS | Status: DC
  Administered 2020-02-05: 2 [IU] via SUBCUTANEOUS
  Administered 2020-02-05: 1 [IU] via SUBCUTANEOUS
  Administered 2020-02-06: 3 [IU] via SUBCUTANEOUS
  Administered 2020-02-06: 1 [IU] via SUBCUTANEOUS
  Administered 2020-02-06: 5 [IU] via SUBCUTANEOUS
  Administered 2020-02-06: 2 [IU] via SUBCUTANEOUS
  Administered 2020-02-06: 1 [IU] via SUBCUTANEOUS
  Administered 2020-02-06: 2 [IU] via SUBCUTANEOUS
  Administered 2020-02-07: 1 [IU] via SUBCUTANEOUS
  Administered 2020-02-07: 2 [IU] via SUBCUTANEOUS
  Administered 2020-02-07: 5 [IU] via SUBCUTANEOUS
  Administered 2020-02-07: 2 [IU] via SUBCUTANEOUS
  Administered 2020-02-07 (×2): 3 [IU] via SUBCUTANEOUS
  Administered 2020-02-08 (×4): 2 [IU] via SUBCUTANEOUS
  Administered 2020-02-08: 3 [IU] via SUBCUTANEOUS
  Administered 2020-02-08 – 2020-02-09 (×3): 2 [IU] via SUBCUTANEOUS
  Administered 2020-02-09: 1 [IU] via SUBCUTANEOUS
  Administered 2020-02-09 – 2020-02-10 (×4): 2 [IU] via SUBCUTANEOUS
  Administered 2020-02-10: 1 [IU] via SUBCUTANEOUS
  Administered 2020-02-10 – 2020-02-11 (×4): 2 [IU] via SUBCUTANEOUS
  Administered 2020-02-11: 1 [IU] via SUBCUTANEOUS
  Administered 2020-02-11 (×2): 2 [IU] via SUBCUTANEOUS
  Administered 2020-02-11: 3 [IU] via SUBCUTANEOUS
  Administered 2020-02-11: 1 [IU] via SUBCUTANEOUS
  Administered 2020-02-12 (×4): 2 [IU] via SUBCUTANEOUS
  Administered 2020-02-12 – 2020-02-13 (×3): 3 [IU] via SUBCUTANEOUS
  Administered 2020-02-13: 1 [IU] via SUBCUTANEOUS

## 2020-02-05 MED ORDER — INSULIN ASPART 100 UNIT/ML ~~LOC~~ SOLN: 0.0000 [IU] | Freq: Every day | SUBCUTANEOUS | Status: DC

## 2020-02-05 NOTE — Progress Notes (Signed)
[facility] PROGRESS NOTE    Progress Note  Brendan [name]  YNW:[id number] DOB: Apr 07, 1961 DOA: 01/22/2020 PCP: Cleda Mccreedy, MD     Brief Narrative:   Brendan [name] is an 59 y.o. 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  Significant Hospital events: 01/22/2020 admission 01/26/2020 left nephrostomy tube exchange 01/26/2020 left craniotomy for subdural hematoma with evacuation 01/29/2020 the left upper extremity midline  Significant diagnostic tests: 2/21 CT Head > Some further enlargement of the subdural hematoma on the left. Overall volume increase estimated at about 1/3. Maximal thickness now measured at 2.5 cm compared with 2 cm previously. More hyperdense component. Summary distribution, including along the posterior falx and the tentorium. Increased mass effect with left-to-right shift of 7 mm today, increased from 4 mm previously.  2/20 KUB > no IVC filter in place  2/25 head CT >> difficult improvement in mass-effect, postoperative changes post left subdural hematoma evacuation.  Rightward shift 4 mm (improved).  Small volume subdural hemorrhage noted along the tentorium.  New small area left superior frontal gyrus hypoattenuation  3/1 CT head Slight decrease in size of residual mixed density extra-axial hemorrhage over the left convexity. Stable to slightly improved midline shift.  No new hemorrhage.  Stable left-sided white matter infarcts. Chronic sinus disease.  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 Neurosurgery and neurology recommended [place] for seizure precautions. Speech evaluated the patient and recommended a dysphagia 3 diet. Plan of care has been consulted and the patient remains full code with full scope of treatment. I had a long discussion with the son about goals of care and end-of-life, the son who is the next of kin does not want to proceed with DNR/DNI. He would like to continue aggressive treatment knowing that his medical condition will continue to deteriorate. I have also explained to him the danger of having him on full dose anticoagulation with a hematoma with a probable old PE.  And he has agreed to continue Lovenox prophylactically and not the full dose.  I have explained the risk and benefits of doing this and he is in agreement. Discontinue telemetry.  Acute metabolic encephalopathy: Likely secondary to #1 with residual lethargy and aphasia consistent with the location of the hematoma, he is slowly improving try to minimize sedatives and hold continue conservative management with medial metabolic rearrangements. Physical therapy has evaluated the patient and recommended skilled nursing facility.  Right DVT status post treatment with enoxaparin due to Xarelto failure: Vascular surgery attempted to place an IVC filter on 01/13/2020 and was unsuccessful. The case was reviewed with Dr. 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 Per neurosurgery is okay to resume anticoagulation however after discussing the case PCCM currently  not restarting full anticoagulation weight  the benefits of restarting systemic full dose anticoagulation giving the acute on chronic nature of a subdural hematoma.  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 Continue to monitor urine output and creatinine, continue mycophenolate. Will give single dose of kayexelate.  Essential hypertension: Remains normotensive continue current regimen.  Anemia acute on chronic multifactorial in the setting of chronic disease: Hemoglobin is stable continue to monitor.  Hyperglycemia: Blood glucose is trending up he is not on steroids or medications document his blood glucose global change his sliding scale continue Lantus,Check A1c  Nutrition: Cont NG tube feeding at night  Advanced care planning/counseling discussion/Goals of care, counseling/discussion Palliative care by specialist: PMT meeting with family.  RN Pressure Injury Documentation:    Estimated body mass index is 31.79 kg/m as calculated from the following:   Height as of this encounter: 6\' 1"  (1.854 m).   Weight as of this encounter: 109.3 kg. Malnutrition Type:  Nutrition Problem: Inadequate oral intake Etiology: decreased appetite   Malnutrition Characteristics:  Signs/Symptoms: meal completion < 50%   Nutrition Interventions:  Interventions: Tube feeding, Prostat  DVT prophylaxis: lovenox Family Communication:son Disposition Plan/Barrier to D/C: Patient coming from a skilled nursing facility will probably go back to skilled nursing facility.  He will need further evaluation by PT and correction of his potassium, before discharging.  Code Status:     Code Status Orders  (From admission, onward)         Start     Ordered   01/23/20 0339  Full code  Continuous     01/23/20 0344        Code Status History    Date Active Date Inactive Code Status  Order ID Comments User Context   12/19/2019 0020 12/22/2019 1703 Full Code [id number]  Vianne Bulls, MD Inpatient   03/22/2018 2045 03/26/2018 1956 Full Code [id number]  Vianne Bulls, MD ED   08/03/2017 0514 08/04/2017 1641 Full Code [id number]  Edwin Dada, MD ED   07/20/2017 0054 07/21/2017 1625 Full Code [id number]  Orvan Falconer, MD Inpatient   02/16/2017 2008 02/20/2017 1953 Full Code [id number]  Truett Mainland, DO Inpatient   Advance Care Planning Activity        IV Access:    Peripheral IV   Procedures and diagnostic studies:   No results found.   Medical Consultants:    None.  Anti-Infectives:   none  Subjective:    Brendan [name] no complains  Objective:    Vitals:   02/05/20 0200 02/05/20 0300 02/05/20 0400 02/05/20 0716  BP:   (!) 148/91 (!) 132/92  Pulse: 97 100 (!) 109 96  Resp: 20 19 17 17   Temp:   98.7 F (37.1 C) 98.4 F (36.9 C)  TempSrc:   Oral Axillary  SpO2: 100% 100% 100% 100%  Weight:      Height:       SpO2: 100 %   Intake/Output Summary (Last 24 hours) at 02/05/2020 0723 Last data filed at 02/05/2020 0717 Gross per 24 hour  Intake 1100 ml  Output 3815 ml  Net -2715 ml   Filed Weights   01/23/20 1500 01/29/20 1200 02/03/20 0900  Weight: 110.3 kg 109.2 kg 109.3 kg    Exam: General exam: In no acute distress. Respiratory system: Good air movement and clear to auscultation. Cardiovascular system:  S1 & S2 heard, RRR. No JVD. Gastrointestinal system: Abdomen is nondistended, soft and nontender.  Extremities: No pedal edema. Skin: No rashes, lesions or ulcer. Data Reviewed:    Labs: Basic Metabolic Panel: Recent Labs  Lab 01/30/20 1321 01/30/20 1321 01/31/20 0531 01/31/20 0531 02/01/20 0741 02/01/20 0741 02/02/20 0653 02/02/20 0653 02/03/20 0442 02/03/20 0442 02/04/20 0637 02/05/20 0540  NA 136   < > 138   < > 135  --  140  --  137  --  134* 135  K 5.8*   < > 5.8*   < > 5.8*   < > 5.6*   < > 5.9*   < > 5.8* 5.1  CL  111   < > 112*   < > 108  --  109  --  107  --  102 104  CO2 17*   < > 19*   < > 21*  --  22  --  21*  --  23 22  GLUCOSE 192*   < > 212*   < > 254*  --  181*  --  281*  --  325* 323*  BUN 42*   < > 39*   < > 30*  --  29*  --  35*  --  41* 45*  CREATININE 2.18*   < > 1.98*   < > 1.88*  --  1.95*  --  1.92*  --  1.99* 2.11*  CALCIUM 7.9*   < > 8.2*   < > 8.7*  --  9.1  --  8.9  --  9.1 8.8*  MG 2.3  --  2.1  --   --   --   --   --   --   --   --   --    < > = values in this interval not displayed.   GFR Estimated Creatinine Clearance: 48.3 mL/min (A) (by C-G formula based on SCr of 2.11 mg/dL (H)). Liver Function Tests: No results for input(s): AST, ALT, ALKPHOS, BILITOT, PROT, ALBUMIN in the last 168 hours. No results for input(s): LIPASE, AMYLASE in the last 168 hours. No results for input(s): AMMONIA in the last 168 hours. Coagulation profile No results for input(s): INR, PROTIME in the last 168 hours. COVID-19 Labs  No results for input(s): DDIMER, FERRITIN, LDH, CRP in the last 72 hours.  Lab Results  Component Value Date   SARSCOV2NAA NEGATIVE 01/23/2020   [place] NEGATIVE 12/18/2019    CBC: Recent Labs  Lab 02/01/20 0741 02/02/20 0653 02/03/20 0442 02/04/20 0637 02/05/20 0540  WBC 8.2 8.2 7.2 6.4 5.9  HGB 8.6* 8.8* 8.8* 9.1* 8.9*  HCT 29.3* 29.6* 29.9* 31.1* 29.7*  MCV 95.8 94.9 95.2 94.0 93.7  PLT 214 238 257 221 242   Cardiac Enzymes: No results for input(s): CKTOTAL, CKMB, CKMBINDEX, TROPONINI in the last 168 hours. BNP (last 3 results) No results for input(s): PROBNP in the last 8760 hours. CBG: Recent Labs  Lab 02/04/20 1118 02/04/20 1626 02/05/20 0020 02/05/20 0439 02/05/20 0714  GLUCAP 348* 190* 353* 309* 329*   D-Dimer: No results for input(s): DDIMER in the last 72 hours. Hgb A1c: No results for input(s): HGBA1C in the last 72 hours. Lipid Profile: No results for input(s): CHOL, HDL, LDLCALC, TRIG, CHOLHDL, LDLDIRECT in the last 72  hours. Thyroid function studies: No results for input(s): TSH, T4TOTAL, T3FREE, THYROIDAB in the last 72 hours.  Invalid input(s): FREET3 Anemia work up: No results for input(s):  VITAMINB12, FOLATE, FERRITIN, TIBC, IRON, RETICCTPCT in the last 72 hours. Sepsis Labs: Recent Labs  Lab 02/02/20 0653 02/03/20 0442 02/04/20 0637 02/05/20 0540  WBC 8.2 7.2 6.4 5.9   Microbiology No results found for this or any previous visit (from the past 240 hour(s)).   Medications:   . chlorhexidine  15 mL Mouth Rinse BID  . Chlorhexidine Gluconate Cloth  6 each Topical Daily  . docusate  50 mg Oral Daily  . famotidine  20 mg Oral QHS  . feeding supplement (PRO-STAT SUGAR FREE 64)  30 mL Oral TID  . heparin injection (subcutaneous)  5,000 Units Subcutaneous Q8H  . insulin aspart  0-20 Units Subcutaneous Q4H  . insulin detemir  25 Units Subcutaneous BID  . levETIRAcetam  500 mg Oral Q12H  . mouth rinse  15 mL Mouth Rinse BID  . mycophenolate  500 mg Oral BID  . sodium chloride flush  10-40 mL Intracatheter Q12H   Continuous Infusions: . sodium chloride Stopped (02/02/20 0535)  . sodium chloride    . feeding supplement (OSMOLITE 1.5 CAL) 1,260 mL (02/05/20 0415)  . levETIRAcetam        LOS: 13 days   Charlynne Cousins  [facility]  02/05/2020, 7:23 AM

## 2020-02-05 NOTE — Progress Notes (Signed)
Daily Progress Note   [name]       Date: 02/05/2020 DOB: [birth year]  Age: 59 y.o. MRN#: [id number] Attending Physician: Charlynne Cousins, MD Primary Care Physician: Cleda Mccreedy, MD Admit Date: 01/22/2020  Reason for Consultation/Follow-up: Non pain symptom management and Pain control  Subjective: [name] was slightly more interactive today. He complains of a moderate amount of pain on his right side. No leg pain or other acute concerns.   Review of Systems  Constitutional: Negative for chills and fever.  Respiratory: Negative for shortness of breath.   Neurological: Negative for headaches.     Length of Stay: 13  Current Medications: Scheduled Meds:  . chlorhexidine  15 mL Mouth Rinse BID  . Chlorhexidine Gluconate Cloth  6 each Topical Daily  . docusate  50 mg Oral Daily  . famotidine  20 mg Oral QHS  . feeding supplement (PRO-STAT SUGAR FREE 64)  30 mL Oral TID  . heparin injection (subcutaneous)  5,000 Units Subcutaneous Q8H  . insulin aspart  0-5 Units Subcutaneous QHS  . insulin aspart  0-9 Units Subcutaneous TID WC  . insulin aspart  3 Units Subcutaneous TID WC  . insulin detemir  25 Units Subcutaneous BID  . levETIRAcetam  500 mg Oral Q12H  . mouth rinse  15 mL Mouth Rinse BID  . mycophenolate  500 mg Oral BID  . sodium chloride flush  10-40 mL Intracatheter Q12H    Continuous Infusions: . sodium chloride Stopped (02/02/20 0535)  . sodium chloride    . feeding supplement (OSMOLITE 1.5 CAL) 1,260 mL (02/05/20 0415)  . levETIRAcetam 500 mg (02/05/20 1009)    PRN Meds: Place/Maintain arterial line **AND** sodium chloride, acetaminophen (TYLENOL) oral liquid 160 mg/5 mL, hydrALAZINE, labetalol, lidocaine (PF), oxyCODONE, polyethylene glycol,  sodium chloride flush  Physical Exam    General: chronicall ill appearing, lying in bed in NAD HEENT: coretrak in place Abd: soft, non-distended, mild TTP in RLQ Ext: RLE with 3+ edema, non-tender        Vital Signs: BP (!) 146/80 (BP Location: Left Arm)   Pulse (!) 103   Temp 98.7 F (37.1 C) (Oral)   Resp 15   Ht 6\' 1"  (1.854 m)   Wt 109.3 kg   SpO2 100%   BMI 31.79 kg/m  SpO2: SpO2: 100 % O2 Device: O2 Device: Room Air O2 Flow Rate:    Intake/output summary:   Intake/Output Summary (Last 24 hours) at 02/05/2020 1139 Last data filed at 02/05/2020 [id number] Gross per 24 hour  Intake 800 ml  Output 3715 ml  Net -2915 ml   LBM: Last BM Date: 02/03/20 Baseline Weight: Weight: 112 kg Most recent weight: Weight: 109.3 kg       Palliative Assessment/Data: 20%      Patient Active Problem List   Diagnosis Date Noted  . Advanced care planning/counseling discussion   . Goals of care, counseling/discussion   . Palliative care by specialist   . Subdural hematoma, acute ([place]) 01/26/2020  . SDH (subdural hematoma) ([place]) 01/23/2020  . DVT (deep venous thrombosis) ([place]) 12/19/2019  . Right leg DVT ([place]) 12/18/2019  . Symptomatic anemia 03/22/2018  . History of DVT in adulthood 03/22/2018  . Gross hematuria   . Nephrostomy tube displaced ([place]) 07/19/2017  . Pyelonephritis 02/16/2017  . 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 Coronary atherosclerosis of native coronary artery   . Essential hypertension   . Stroke ([place])   . Diabetes mellitus without complication ([place])   . Chronic kidney disease, stage IV (severe) ([place])   . GERD (gastroesophageal reflux disease)   . Glaucoma   . Hemiparesis affecting right side as late effect of cerebrovascular accident ([place])   . Enlarged prostate with urinary obstruction   . Constipation     Palliative Care Assessment & Plan   Patient Profile: 59 y.o. 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    Assessment/Recommendations/Plan   Continue current treatment plan   Communicated with nurse regarding pain, has PO Oxy prn   Goals of Care and Additional Recommendations:  Limitations on Scope of Treatment: Full Scope Treatment  Code Status:  Full code  Prognosis:   Unable to determine  Discharge Planning:  [facility] for rehab with Palliative care service follow-up  Will call son and sister to update.   Thank you for allowing the Palliative Medicine Team to assist in the care of this patient.   Time In: 11:00 Time Out: 11:30 Total Time 30 mins Prolonged Time Billed  no       Greater than 50%  of this time was spent counseling and coordinating care related to the above assessment and plan.  Modena Nunnery, DO PGY-2  Please contact Palliative Medicine Team phone at [phone] for questions and concerns.

## 2020-02-05 NOTE — Progress Notes (Addendum)
Inpatient Diabetes Program Recommendations  AACE/ADA: New Consensus Statement on Inpatient Glycemic Control (2015)  Target Ranges:  Prepandial:   less than 140 mg/dL      Peak postprandial:   less than 180 mg/dL (1-2 hours)      Critically ill patients:  140 - 180 mg/dL   Lab Results  Component Value Date   GLUCAP 328 (H) 02/05/2020   HGBA1C 5.0 01/24/2020    Review of Glycemic Control Results for SEELEY, LETTER (MRN [id number]) as of 02/05/2020 11:10  Ref. Range 02/05/2020 00:20 02/05/2020 04:39 02/05/2020 07:14 02/05/2020 11:06  Glucose-Capillary Latest Ref Range: 70 - 99 mg/dL 353 (H) 309 (H) 329 (H) 328 (H)   Diabetes history: No noted hx Outpatient Diabetes medications: none Current orders for Inpatient glycemic control: Levemir 25 units BId, Novolog 0-9 units TID, Novolog 0-5 units QHS, Novolog 3 units TID Osmolite @ 60 ml/hr from 1700-0700  Inpatient Diabetes Program Recommendations:    Noted increased glucose trends and that tube feeds are continuously running at 90 ml/hr. Spoke with RN to confirm that tube feeds were running and MD was aware. Patient continues to have poor oral intake per speech note.  In this case, if tube feeds to remain continuous consider: -Changing correction to Novolog 0-9 units Q4H -Adding tube feed coverage: Novolog 4 units Q4H -Increasing Levemir to 28 units BID.   Secure chat sent to MD regarding above recommendations and plan for tube feeds.   Addendum @1550 : Noted tube feeds off. Secure chat sent to MD regarding order changes and recommendations as patient continues to have poor oral intake.   Thanks, Bronson Curb, MSN, RNC-OB Diabetes Coordinator [phone] (8a-5p)

## 2020-02-05 NOTE — Progress Notes (Signed)
  Speech Language Pathology Treatment: Dysphagia;Cognitive-Linquistic  Patient Details Name: Brendan [name] MRN: [id number] DOB: 03-Jun-1961 Today's Date: 02/05/2020 Time: [id number] SLP Time Calculation (min) (ACUTE ONLY): 24 min  Assessment / Plan / Recommendation Clinical Impression  Pt seen for treatment focusing on dysphagia and aphasia. Throughout the session, pt was provided with yes/no questions, or the option of verbally choosing between a field of two to facilitate verbal expression, but mostly nodded or made a "grunting" sound to communicate personal wants/needs. This is significantly less than the previous session when videoconferencing with his family. Pt continues to require encouragement and Max cues to initiate eating meals during therapy. He had a breakfast tray upon arrival and when asked if he would eat any of it, he responded "no," but nodded his head "yes" when asked if he wanted peaches. Pt noted with prolonged mastication, but continues to be functional with Mod I. He was also observed with thin liquids drinking at an impulsively fast rate, but no s/sx of aspiration. At the end of session, when asked if he would like to eat any of his breakfast, he responded "no" and pushed his tray away. Will continue to follow.   HPI HPI: This is a 59 year old nursing home resident with DM, L CVA 2009, and CAD who is chronically anticoagulated with Lovenox due to the presence of a right lower extremity DVT despite the fact that he has a IVC filter in place.  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  He came to medical attention at his [place] home because of aphasia and weakness.  A CT scan of the head has shown acute on chronic subdural hematoma, now s/p evacuation 2/22.      SLP Plan  Continue with current plan of care       Recommendations  Diet recommendations: Dysphagia 3 (mechanical soft);Thin liquid Liquids  provided via: Cup;Straw Medication Administration: Whole meds with liquid Supervision: Staff to assist with self feeding Compensations: Slow rate;Small sips/bites Postural Changes and/or Swallow Maneuvers: Seated upright 90 degrees                Oral Care Recommendations: Oral care BID Follow up Recommendations: Skilled Nursing facility SLP Visit Diagnosis: Dysphagia, oral phase (R13.11);Aphasia (R47.01) Plan: Continue with current plan of care       [place], Student SLP Office: [phone]  02/05/2020, 9:17 AM

## 2020-02-05 NOTE — Progress Notes (Addendum)
Nutrition Follow-up  DOCUMENTATION CODES:   Not applicable  INTERVENTION:  Provide Glucerna Shake po TID, each supplement provides 220 kcal and 10 grams of protein.  Nocturnal tube feeds via Cortrak NGT using Osmolite 1.5 formula at goal rate of 75 ml/hr x 14 hours (5pm-7am).  Provide 60 ml Prostat BID.   Tube feeding regimen to provide 1975 kcal (90% of kcal needs), 116 grams of protein (100% of protein), 798 ml water.   NUTRITION DIAGNOSIS:   Inadequate oral intake related to decreased appetite as evidenced by meal completion < 50%; ongoing  GOAL:   Patient will meet greater than or equal to 90% of their needs; met with TF  MONITOR:   PO intake, Supplement acceptance, TF tolerance  REASON FOR ASSESSMENT:   Other (Comment)(Cortrak placement)    ASSESSMENT:   Patient with PMH significant for CKD IV, DM, essential HTN, GERD, CVA with residual hemiparesis (R side), HLD, and PVD. Presents this admission with acute on chronic SDH with AMS.  2/19 cortrak placed, no TF started per MD 2/22 L crani; percutaneous nephrostomy tube exchange  2/23 TF started; Dysphagia 2/Thin liquids started; PO 0-10% 3/1 diet advanced to Dysphagia 3/Thin liquids  Meal completion 0%. Pt has been refusing his food at meal trays. Cortrak NGT remains in place for enteral nutrition. RD to modify tube feeding rate to ensure at least 90% of kcal needs and 100% of protein needs are met as pt po intake has been very poor. RD to additionally order nutritional supplements PO to encourage intake. RD to continue to monitor. Staff continues to encourage PO intake. Labs and medications reviewed.   Diet Order:   Diet Order            DIET DYS 3 Room service appropriate? Yes; Fluid consistency: Thin  Diet effective now              EDUCATION NEEDS:   Not appropriate for education at this time  Skin:  Skin Assessment: Skin Integrity Issues: Skin Integrity Issues:: Incisions Incisions: head Other: MASD-  buttocks, groin  Last BM:  3/2  Height:   Ht Readings from Last 1 Encounters:  01/23/20 '6\' 1"'$  (1.854 m)    Weight:   Wt Readings from Last 1 Encounters:  02/03/20 109.3 kg   BMI:  Body mass index is 31.79 kg/m.  Estimated Nutritional Needs:   Kcal:  2200-2400 kcal  Protein:  110-125 grams  Fluid:  >/= 2.2 L/day    Corrin Parker, MS, RD, LDN RD pager number/after hours weekend pager number on Amion.

## 2020-02-05 NOTE — Progress Notes (Signed)
Patient ID: Brendan [name], male   DOB: Apr 08, 1961, 59 y.o.   MRN: [id number] BP (!) 141/94 (BP Location: Left Arm)   Pulse 90   Temp (!) 97 F (36.1 C) (Oral)   Resp 15   Ht 6\' 1"  (1.854 m)   Wt 109.3 kg   SpO2 100%   BMI 31.79 kg/m  Sleeping, awoke to voice. Follows commands Wound is clean, dry, and without signs of infection At baseline. No new recommendations

## 2020-02-06 LAB — CBC
HCT: 28.9 % — ABNORMAL LOW (ref 39.0–52.0)
Hemoglobin: 8.6 g/dL — ABNORMAL LOW (ref 13.0–17.0)
MCH: 27.6 pg (ref 26.0–34.0)
MCHC: 29.8 g/dL — ABNORMAL LOW (ref 30.0–36.0)
MCV: 92.6 fL (ref 80.0–100.0)
Platelets: 240 10*3/uL (ref 150–400)
RBC: 3.12 MIL/uL — ABNORMAL LOW (ref 4.22–5.81)
RDW: 16.3 % — ABNORMAL HIGH (ref 11.5–15.5)
WBC: 5.5 10*3/uL (ref 4.0–10.5)
nRBC: 0 % (ref 0.0–0.2)

## 2020-02-06 LAB — BASIC METABOLIC PANEL
Anion gap: 7 (ref 5–15)
BUN: 41 mg/dL — ABNORMAL HIGH (ref 6–20)
CO2: 23 mmol/L (ref 22–32)
Calcium: 8.9 mg/dL (ref 8.9–10.3)
Chloride: 104 mmol/L (ref 98–111)
Creatinine, Ser: 2.15 mg/dL — ABNORMAL HIGH (ref 0.61–1.24)
GFR calc Af Amer: 37 mL/min — ABNORMAL LOW (ref >60)
GFR calc non Af Amer: 32 mL/min — ABNORMAL LOW (ref >60)
Glucose, Bld: 244 mg/dL — ABNORMAL HIGH (ref 70–99)
Potassium: 5.2 mmol/L — ABNORMAL HIGH (ref 3.5–5.1)
Sodium: 134 mmol/L — ABNORMAL LOW (ref 135–145)

## 2020-02-06 LAB — GLUCOSE, CAPILLARY
Glucose-Capillary: 251 mg/dL — ABNORMAL HIGH (ref 70–99)
Glucose-Capillary: 194 mg/dL — ABNORMAL HIGH (ref 70–99)
Glucose-Capillary: 145 mg/dL — ABNORMAL HIGH (ref 70–99)
Glucose-Capillary: 134 mg/dL — ABNORMAL HIGH (ref 70–99)
Glucose-Capillary: 202 mg/dL — ABNORMAL HIGH (ref 70–99)

## 2020-02-06 MED ORDER — PHENYLEPHRINE HCL-NACL 10-0.9 MG/250ML-% IV SOLN
INTRAVENOUS | Status: AC
  Filled 2020-02-06: qty 250

## 2020-02-06 NOTE — Progress Notes (Signed)
Physical Therapy Treatment Patient Details Name: Brendan [name] MRN: [id number] DOB: [birth year] Today's Date: 02/06/2020    History of Present Illness 59 year old nursing home resident with DM, L CVA 2009, and CAD who is chronically anticoagulated with Lovenox due to the presence of a right lower extremity DVT despite the fact that he has a IVC filter in place.  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  He came to medical attention at his [name] because of aphasia and weakness.  A CT scan of the head has shown acute on chronic subdural hematoma., pt s/p evacuation of L SDH 01/26/20    PT Comments    Pt is still not wanting to participate with therapy, but with encouragement did agree to do some standing today and work on transitioning to/from EOB   Follow Up Recommendations  SNF;Supervision/Assistance - 24 hour     Equipment Recommendations  None recommended by PT    Recommendations for Other Services       Precautions / Restrictions Precautions Precautions: Fall    Mobility  Bed Mobility Overal bed mobility: Needs Assistance Bed Mobility: Supine to Sit;Sit to Supine     Supine to sit: Mod assist Sit to supine: Mod assist;+2 for physical assistance   General bed mobility comments: pt participated in getting repositioned up toward [facility].  Transfers Overall transfer level: Needs assistance Equipment used: (chair back) Transfers: Sit to/from Stand Sit to Stand: Max assist;+2 safety/equipment         General transfer comment: cues for hand placement and assist to come forward and boost.  Ambulation/Gait             General Gait Details: not tested   Stairs             Wheelchair Mobility    Modified Rankin (Stroke Patients Only) Modified Rankin (Stroke Patients Only) Modified Rankin: Severe disability     Balance Overall balance assessment: Needs assistance    Sitting balance-Leahy Scale: Fair     Standing balance support: During functional activity;Single extremity supported Standing balance-Leahy Scale: Poor Standing balance comment: 2 trials of standing at EOB, guarding/blocking R LE.  Pt able to attain upright posture x2 with significant support.                            Cognition Arousal/Alertness: Awake/alert Behavior During Therapy: Flat affect Overall Cognitive Status: (NT formally, unable to determine)                                 General Comments: still appears significantly depressed.      Exercises General Exercises - Lower Extremity Heel Slides: AROM;AAROM;Both;10 reps(graded resistance in gross extension)    General Comments        Pertinent Vitals/Pain Pain Assessment: Faces Faces Pain Scale: No hurt Pain Intervention(s): Monitored during session    Home Living                      Prior Function            PT Goals (current goals can now be found in the care plan section) Acute Rehab PT Goals Patient Stated Goal: unable to state PT Goal Formulation: Patient unable to participate in goal setting Time For Goal Achievement: 02/13/20 Potential to Achieve Goals:  Fair Progress towards PT goals: Not progressing toward goals - comment(pt is showing signs of depression and apathy, cont. goals)    Frequency    Min 3X/week      PT Plan Current plan remains appropriate    Co-evaluation              AM-PAC PT "6 Clicks" Mobility   Outcome Measure  Help needed turning from your back to your side while in a flat bed without using bedrails?: A Lot Help needed moving from lying on your back to sitting on the side of a flat bed without using bedrails?: A Lot Help needed moving to and from a bed to a chair (including a wheelchair)?: Total Help needed standing up from a chair using your arms (e.g., wheelchair or bedside chair)?: Total Help needed to walk in hospital  room?: Total Help needed climbing 3-5 steps with a railing? : Total 6 Click Score: 8    End of Session   Activity Tolerance: Patient tolerated treatment well Patient left: in bed;with call bell/phone within reach;with bed alarm set Nurse Communication: Mobility status PT Visit Diagnosis: Other abnormalities of gait and mobility (R26.89);Muscle weakness (generalized) (M62.81);Hemiplegia and hemiparesis Hemiplegia - Right/Left: Right Hemiplegia - dominant/non-dominant: Dominant Hemiplegia - caused by: Nontraumatic intracerebral hemorrhage     Time: 1346-1416 PT Time Calculation (min) (ACUTE ONLY): 30 min  Charges:  $Therapeutic Activity: 23-37 mins                     02/06/2020  Brendan [name]., PT Acute Rehabilitation Services [phone]  (pager) [phone]  (office)   Brendan [name] 02/06/2020, 5:44 PM

## 2020-02-06 NOTE — Progress Notes (Signed)
[facility] PROGRESS NOTE    Progress Note  Brendan [name]  YNW:[id number] DOB: Apr 07, 1961 DOA: 01/22/2020 PCP: Cleda Mccreedy, MD     Brief Narrative:   Brendan [name] is an 59 y.o. 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  Significant Hospital events: 01/22/2020 admission 01/26/2020 left nephrostomy tube exchange 01/26/2020 left craniotomy for subdural hematoma with evacuation 01/29/2020 the left upper extremity midline  Significant diagnostic tests: 2/21 CT Head > Some further enlargement of the subdural hematoma on the left. Overall volume increase estimated at about 1/3. Maximal thickness now measured at 2.5 cm compared with 2 cm previously. More hyperdense component. Summary distribution, including along the posterior falx and the tentorium. Increased mass effect with left-to-right shift of 7 mm today, increased from 4 mm previously.  2/20 KUB > no IVC filter in place  2/25 head CT >> difficult improvement in mass-effect, postoperative changes post left subdural hematoma evacuation.  Rightward shift 4 mm (improved).  Small volume subdural hemorrhage noted along the tentorium.  New small area left superior frontal gyrus hypoattenuation  3/1 CT head Slight decrease in size of residual mixed density extra-axial hemorrhage over the left convexity. Stable to slightly improved midline shift.  No new hemorrhage.  Stable left-sided white matter infarcts. Chronic sinus disease.  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 Neurosurgery and neurology recommended [place] for seizure precautions. Speech evaluated the patient and recommended a dysphagia 3 diet. Plan of care has been consulted and the patient remains full code with full scope of treatment. I had a long discussion with the son about goals of care and end-of-life, the son who is the next of kin does not want to proceed with DNR/DNI. He would like to continue aggressive treatment knowing that his medical condition will continue to deteriorate. I have also explained to him the danger of having him on full dose anticoagulation with a hematoma with a probable old PE.  And he has agreed to continue Lovenox prophylactically and not the full dose.  I have explained the risk and benefits of doing this and he is in agreement. Discontinue telemetry.  Acute metabolic encephalopathy: Likely secondary to #1 with residual lethargy and aphasia consistent with the location of the hematoma, he is slowly improving try to minimize sedatives and hold continue conservative management with medial metabolic rearrangements. Physical therapy has evaluated the patient and recommended skilled nursing facility.  Right DVT status post treatment with enoxaparin due to Xarelto failure: Vascular surgery attempted to place an IVC filter on 01/13/2020 and was unsuccessful. The case was reviewed with Dr. 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 Per neurosurgery is okay to resume anticoagulation however after discussing the case PCCM currently  not restarting full anticoagulation weight  the benefits of restarting systemic full dose anticoagulation giving the acute on chronic nature of a subdural hematoma.  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 Continue to monitor urine output and creatinine, continue mycophenolate. Will give single dose of kayexelate.  Essential hypertension: Remains normotensive continue current regimen.  Anemia acute on chronic multifactorial in the setting of chronic disease: Hemoglobin is stable continue to monitor.  Hyperglycemia: Blood glucose is trending up he is not on steroids or medications document his blood glucose global change his sliding scale continue Lantus,Check A1c  Nutrition: Patient with minimal eating, currently has a core track. We will give her 1 additional day if his nutrition continues to be poor, we will have to discuss with the family the need of a PEG tube. On my previous conversations with the family they appeared to want aggressive therapy.  Last hospice and palliative care discussed with family the need of a PEG tube.  Advanced care planning/counseling discussion/Goals of care, counseling/discussion Palliative care by specialist: PMT meeting with family.  RN Pressure Injury Documentation:    Estimated body mass index is 31.79 kg/m as calculated from the following:   Height as of this encounter: 6\' 1"  (1.854 m).   Weight as of this encounter: 109.3 kg. Malnutrition Type:  Nutrition Problem: Inadequate oral intake Etiology: decreased appetite   Malnutrition Characteristics:  Signs/Symptoms: meal completion < 50%   Nutrition Interventions:  Interventions: Tube feeding, Prostat  DVT prophylaxis: lovenox Family Communication:son Disposition Plan/Barrier to D/C: Patient coming from a skilled nursing facility will probably go back to skilled nursing  facility.  Patient with minimal nutrition per mouth, speech evaluated the patient and is currently on a diet, if he continues to have minimal oral intake we will have to discuss with the family the need of PEG tube placement for nutrition.  Code Status:     Code Status Orders  (From admission, onward)         Start     Ordered   01/23/20 0339  Full code  Continuous     01/23/20 0344        Code Status History    Date Active Date Inactive Code Status Order ID Comments User Context   12/19/2019 0020 12/22/2019 1703 Full Code [id number]  Vianne Bulls, MD Inpatient   03/22/2018 2045 03/26/2018 1956 Full Code [id number]  Vianne Bulls, MD ED   08/03/2017 0514 08/04/2017 1641 Full Code [id number]  Edwin Dada, MD ED   07/20/2017 0054 07/21/2017 1625 Full Code [id number]  Orvan Falconer, MD Inpatient   02/16/2017 2008 02/20/2017 1953 Full Code [id number]  Truett Mainland, DO Inpatient   Advance Care Planning Activity        IV Access:    Peripheral IV   Procedures and diagnostic studies:   No results found.   Medical Consultants:    None.  Anti-Infectives:   none  Subjective:    Brendan [name] no complaints  Objective:    Vitals:   02/05/20 2028 02/05/20 2326 02/06/20 0400 02/06/20 0732  BP: (!) 141/94 (!) 152/88 138/90 (!) 147/95  Pulse: 90 97 98 96  Resp: 15 15 15 15   Temp: (!) 97 F (36.1 C) 98.2 F (36.8 C) 98.6 F (37 C) 98.6 F (37 C)  TempSrc: Oral Oral Oral Oral  SpO2: 100% 100%  100% 100%  Weight:      Height:       SpO2: 100 %   Intake/Output Summary (Last 24 hours) at 02/06/2020 0752 Last data filed at 02/06/2020 0739 Gross per 24 hour  Intake 0 ml  Output 2575 ml  Net -2575 ml   Filed Weights   01/23/20 1500 01/29/20 1200 02/03/20 0900  Weight: 110.3 kg 109.2 kg 109.3 kg    Exam: General exam: In no acute distress. Respiratory system: Good air movement and clear to auscultation. Cardiovascular system: S1 & S2 heard, RRR. No  JVD. Gastrointestinal system: Abdomen is nondistended, soft and nontender.  Extremities: No pedal edema. Skin: No rashes, lesions or ulcers  Data Reviewed:    Labs: Basic Metabolic Panel: Recent Labs  Lab 01/30/20 1321 01/30/20 1321 01/31/20 0531 02/01/20 0741 02/02/20 0653 02/02/20 0653 02/03/20 0442 02/03/20 0442 02/04/20 0637 02/04/20 0637 02/05/20 0540 02/06/20 0632  NA 136   < > 138   < > 140  --  137  --  134*  --  135 134*  K 5.8*   < > 5.8*   < > 5.6*   < > 5.9*   < > 5.8*   < > 5.1 5.2*  CL 111   < > 112*   < > 109  --  107  --  102  --  104 104  CO2 17*   < > 19*   < > 22  --  21*  --  23  --  22 23  GLUCOSE 192*   < > 212*   < > 181*  --  281*  --  325*  --  323* 244*  BUN 42*   < > 39*   < > 29*  --  35*  --  41*  --  45* 41*  CREATININE 2.18*   < > 1.98*   < > 1.95*  --  1.92*  --  1.99*  --  2.11* 2.15*  CALCIUM 7.9*   < > 8.2*   < > 9.1  --  8.9  --  9.1  --  8.8* 8.9  MG 2.3  --  2.1  --   --   --   --   --   --   --   --   --    < > = values in this interval not displayed.   GFR Estimated Creatinine Clearance: 47.4 mL/min (A) (by C-G formula based on SCr of 2.15 mg/dL (H)). Liver Function Tests: No results for input(s): AST, ALT, ALKPHOS, BILITOT, PROT, ALBUMIN in the last 168 hours. No results for input(s): LIPASE, AMYLASE in the last 168 hours. No results for input(s): AMMONIA in the last 168 hours. Coagulation profile No results for input(s): INR, PROTIME in the last 168 hours. COVID-19 Labs  No results for input(s): DDIMER, FERRITIN, LDH, CRP in the last 72 hours.  Lab Results  Component Value Date   SARSCOV2NAA NEGATIVE 01/23/2020   [place] NEGATIVE 12/18/2019    CBC: Recent Labs  Lab 02/02/20 0653 02/03/20 0442 02/04/20 0637 02/05/20 0540 02/06/20 0632  WBC 8.2 7.2 6.4 5.9 5.5  HGB 8.8* 8.8* 9.1* 8.9* 8.6*  HCT 29.6* 29.9* 31.1* 29.7* 28.9*  MCV 94.9 95.2 94.0 93.7 92.6  PLT 238 257 221 242 240   Cardiac Enzymes: No  results for input(s): CKTOTAL, CKMB, CKMBINDEX, TROPONINI in the last 168 hours. BNP (last 3 results) No results for input(s): PROBNP in the last  8760 hours. CBG: Recent Labs  Lab 02/05/20 1626 02/05/20 2033 02/05/20 2342 02/06/20 0404 02/06/20 0734  GLUCAP 140* 195* 222* 251* 194*   D-Dimer: No results for input(s): DDIMER in the last 72 hours. Hgb A1c: No results for input(s): HGBA1C in the last 72 hours. Lipid Profile: No results for input(s): CHOL, HDL, LDLCALC, TRIG, CHOLHDL, LDLDIRECT in the last 72 hours. Thyroid function studies: No results for input(s): TSH, T4TOTAL, T3FREE, THYROIDAB in the last 72 hours.  Invalid input(s): FREET3 Anemia work up: No results for input(s): VITAMINB12, FOLATE, FERRITIN, TIBC, IRON, RETICCTPCT in the last 72 hours. Sepsis Labs: Recent Labs  Lab 02/03/20 0442 02/04/20 0637 02/05/20 0540 02/06/20 0632  WBC 7.2 6.4 5.9 5.5   Microbiology No results found for this or any previous visit (from the past 240 hour(s)).   Medications:   . chlorhexidine  15 mL Mouth Rinse BID  . Chlorhexidine Gluconate Cloth  6 each Topical Daily  . docusate  50 mg Oral Daily  . famotidine  20 mg Oral QHS  . feeding supplement (GLUCERNA SHAKE)  237 mL Oral TID BM  . feeding supplement (OSMOLITE 1.5 CAL)  1,050 mL Per Tube Q24H  . feeding supplement (PRO-STAT SUGAR FREE 64)  60 mL Oral BID  . heparin injection (subcutaneous)  5,000 Units Subcutaneous Q8H  . insulin aspart  0-9 Units Subcutaneous Q4H  . insulin detemir  28 Units Subcutaneous BID  . levETIRAcetam  500 mg Oral Q12H  . mouth rinse  15 mL Mouth Rinse BID  . mycophenolate  500 mg Oral BID  . sodium chloride flush  10-40 mL Intracatheter Q12H   Continuous Infusions: . sodium chloride Stopped (02/02/20 0535)  . sodium chloride    . levETIRAcetam 500 mg (02/05/20 1009)      LOS: 14 days   Charlynne Cousins  [facility]  02/06/2020, 7:52 AM

## 2020-02-06 NOTE — Plan of Care (Signed)
  Problem: Education: Goal: Knowledge of General Education information will improve Description: Including pain rating scale, medication(s)/side effects and non-pharmacologic comfort measures Outcome: Progressing   Problem: Health Behavior/Discharge Planning: Goal: Ability to manage health-related needs will improve Outcome: Progressing   Problem: Clinical Measurements: Goal: Ability to maintain clinical measurements within normal limits will improve Outcome: Progressing Goal: Will remain free from infection Outcome: Progressing Goal: Diagnostic test results will improve Outcome: Progressing Goal: Respiratory complications will improve Outcome: Progressing Goal: Cardiovascular complication will be avoided Outcome: Progressing   Problem: Activity: Goal: Risk for activity intolerance will decrease Outcome: Progressing   Problem: Nutrition: Goal: Adequate nutrition will be maintained Outcome: Progressing   Problem: Coping: Goal: Level of anxiety will decrease Outcome: Progressing   Problem: Elimination: Goal: Will not experience complications related to bowel motility Outcome: Progressing Goal: Will not experience complications related to urinary retention Outcome: Progressing   Problem: Pain Managment: Goal: General experience of comfort will improve Outcome: Progressing   Problem: Safety: Goal: Ability to remain free from injury will improve Outcome: Progressing   Problem: Skin Integrity: Goal: Risk for impaired skin integrity will decrease Outcome: Progressing   Problem: Education: Goal: Knowledge of the prescribed therapeutic regimen will improve Outcome: Progressing   Problem: Activity: Goal: Ability to tolerate increased activity will improve Outcome: Progressing   Problem: Health Behavior/Discharge Planning: Goal: Identification of resources available to assist in meeting health care needs will improve Outcome: Progressing   Problem:  Nutrition: Goal: Maintenance of adequate nutrition will improve Outcome: Progressing   Problem: Clinical Measurements: Goal: Complications related to the disease process, condition or treatment will be avoided or minimized Outcome: Progressing   Problem: Respiratory: Goal: Will regain and/or maintain adequate ventilation Outcome: Progressing   Problem: Skin Integrity: Goal: Demonstration of wound healing without infection will improve Outcome: Progressing   

## 2020-02-07 LAB — GLUCOSE, CAPILLARY
Glucose-Capillary: 150 mg/dL — ABNORMAL HIGH (ref 70–99)
Glucose-Capillary: 184 mg/dL — ABNORMAL HIGH (ref 70–99)
Glucose-Capillary: 190 mg/dL — ABNORMAL HIGH (ref 70–99)
Glucose-Capillary: 210 mg/dL — ABNORMAL HIGH (ref 70–99)
Glucose-Capillary: 243 mg/dL — ABNORMAL HIGH (ref 70–99)
Glucose-Capillary: 254 mg/dL — ABNORMAL HIGH (ref 70–99)

## 2020-02-07 MED ORDER — SODIUM POLYSTYRENE SULFONATE 15 GM/60ML PO SUSP
60.0000 g | Freq: Three times a day (TID) | ORAL | Status: AC
  Administered 2020-02-07: 60 g via ORAL
  Filled 2020-02-07: qty 240

## 2020-02-07 MED ORDER — PRO-STAT SUGAR FREE PO LIQD
30.0000 mL | Freq: Two times a day (BID) | ORAL | Status: DC
  Administered 2020-02-07 – 2020-02-12 (×9): 30 mL via ORAL
  Filled 2020-02-07 (×10): qty 30

## 2020-02-07 MED ORDER — NEPRO/CARBSTEADY PO LIQD
1000.0000 mL | ORAL | Status: DC
  Administered 2020-02-07 – 2020-02-11 (×5): 1000 mL via ORAL
  Filled 2020-02-07 (×5): qty 1000

## 2020-02-07 MED ORDER — NEPRO/CARBSTEADY PO LIQD
237.0000 mL | Freq: Two times a day (BID) | ORAL | Status: DC
  Administered 2020-02-07 – 2020-02-12 (×9): 237 mL via ORAL

## 2020-02-07 NOTE — Progress Notes (Addendum)
Nutrition Follow-up  **RD working remotely**  DOCUMENTATION CODES:   Not applicable  INTERVENTION:  D/c Glucerna Shake po TID, each supplement provides 220 kcal and 10 grams of protein  Order Nepro Shake po BID, each supplement provides 425 kcal and 19 grams protein  Change nocturnal tube feeding via Cortrak to Nepro with Carb steady formula at goal rate of 86m/hr x 14 hours (5pm - 7am)  Provide 385mPro-stat BID.   Tube feeding regimen to provide 1964 kcal (89% of kcal needs), 109 grams of protein (99% of protein needs), 712 ml free water.  NUTRITION DIAGNOSIS:   Inadequate oral intake related to decreased appetite as evidenced by meal completion < 50%.  Ongoing.  GOAL:   Patient will meet greater than or equal to 90% of their needs  Met with tube feeding.  MONITOR:   PO intake, Supplement acceptance, TF tolerance  REASON FOR ASSESSMENT:   Consult Other (Comment)("review all feeds and reduce potassium")  ASSESSMENT:   Patient with PMH significant for CKD IV, DM, essential HTN, GERD, CVA with residual hemiparesis (R side), HLD, and PVD. Presents this admission with acute on chronic SDH with AMS.  2/19 cortrak placed, no TF started per MD 2/22 L crani; percutaneous nephrostomy tube exchange  2/23 TF started; Dysphagia 2/Thin liquids started; PO 0-10% 3/1 diet advanced to Dysphagia 3/Thin liquids  RD consulted to change feeding to lower potassium formula.   Per MD, PEG tube to be considered with continued poor PO intake.   PO intake: 0-20% x last 8 recorded meals (5% average intake)  Pt currently receiving nocturnal tube feeding to help meet protein/calorie needs. Current regimen: Osmolite 1.5 @ 756mr for 14 hours (5pm-7am), 80m3mo-stat BID  Medications reviewed and include: Colace, Pepcid, Glucerna Shake TID,SSI, Levemir, Kayexalate  Labs reviewed: Na 134 (L), K+ 5.2 (H), BUN/Cr 41/2.15 (H)  UOP: 2,825ml52m hours I/O: -11,179.9ml s37me  01/24/20  Diet Order:   Diet Order            DIET DYS 3 Room service appropriate? Yes; Fluid consistency: Thin  Diet effective now              EDUCATION NEEDS:   Not appropriate for education at this time  Skin:  Skin Assessment: Skin Integrity Issues: Skin Integrity Issues:: Incisions, Other (Comment) Incisions: head Other: MASD - buttocks, groin  Last BM:  3/2  Height:   Ht Readings from Last 1 Encounters:  01/23/20 '6\' 1"'$  (1.854 m)    Weight:   Wt Readings from Last 1 Encounters:  02/03/20 109.3 kg    BMI:  Body mass index is 31.79 kg/m.  Estimated Nutritional Needs:   Kcal:  2200-2400 kcal  Protein:  110-125 grams  Fluid:  >/= 2.2 L/day  AmandaLarkin InaRD, LDN RD pager number and weekend/on-call pager number located in [email or web address]

## 2020-02-07 NOTE — Progress Notes (Signed)
PROGRESS NOTE    Brendan [name]  PRF:[id number] DOB: 1961-02-12 DOA: 01/22/2020 PCP: Cleda Mccreedy, MD     Brief Narrative:  Brendan [name] is 59 y.o.BM PMHx Hx of DVT previously on Xarelto, now on lovenox,   who came into the ED from skilled due to worsening left-sided weakness and aphasia he was noted to have some new hematuria in the setting of obstructive uropathy with chronic nephrostomy tubes CT of the head was done that showed an acute on chronic subdural hematoma with encephalomalacia admitted by [facility] neurosurgery was consulted, his Lovenox was reversed with protamine on admission.  Subjective: A/O x1 (does not know where, when, why), follows commands.   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 Neurosurgery and neurology recommended [place] for seizure precautions. Speech evaluated the patient and recommended a dysphagia 3 diet. Plan of care has been consulted and the patient remains full code with full scope of treatment. I had a long discussion with the son about goals of care and end-of-life, the son who is the next of kin does not want to proceed with DNR/DNI. He would like to continue aggressive treatment knowing that his medical condition will continue to deteriorate. I have also explained to him the danger of having him on full dose anticoagulation with a hematoma with a probable old PE.  And he has agreed to continue Lovenox prophylactically and not the full dose.  I have explained the risk and benefits of doing this and he is in agreement. Discontinue telemetry.  Acute metabolic encephalopathy: Likely secondary to #1  with residual lethargy and aphasia consistent with the location of the hematoma, he is slowly improving try to minimize sedatives and hold continue conservative management with medial metabolic rearrangements. Physical therapy has evaluated the patient and recommended skilled nursing facility.  Right DVT status post treatment with enoxaparin due to Xarelto failure: Vascular surgery attempted to place an IVC filter on 01/13/2020 and was unsuccessful. The case was reviewed with Dr. 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 Per neurosurgery is okay to resume anticoagulation however after discussing the case PCCM currently not restarting full anticoagulation weight the benefits of restarting systemic full dose anticoagulation giving the acute on chronic nature of a subdural hematoma.  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  Anemia acute on chronic multifactorial in the setting of chronic disease: Hemoglobin is stable continue to monitor.  Hyperglycemia: Blood glucose is trending up he is not on steroids or medications document his blood glucose global change his sliding scale continue Lantus,Check A1c  Nutrition: Patient with minimal eating, currently has a core track. We will give her 1 additional day if his nutrition continues to be poor, we will have to discuss with the family the need of a PEG tube. On my previous conversations with the family they appeared to want aggressive  therapy.  Last hospice and palliative care discussed with family the need of a PEG tube.  Advanced care planning/counseling  discussion/Goals of care, counseling/discussion Palliative care by specialist: PMT meeting with family.  RN Pressure Injury Documentation:  Estimated body mass index is 31.79 kg/m as calculated from the following:   Height as of this encounter: 6\' 1"  (1.854 m).   Weight as of this encounter: 109.3 kg. Malnutrition Type:  Nutrition Problem: Inadequate oral intake Etiology: decreased appetite   Malnutrition Characteristics:  Signs/Symptoms: meal completion < 50%   Nutrition Interventions:  Interventions: Tube feeding, Prostat      DVT prophylaxis: Subcu heparin Code Status: Full code Family Communication:  Disposition Plan:    Consultants:    Procedures/Significant Events:  01/22/2020 admission 01/26/2020 left nephrostomy tube exchange 01/26/2020 left craniotomy for subdural hematoma with evacuation 01/29/2020 the left upper extremity midline 2/21 CT Head>Some further enlargement of the subdural hematoma on the left.Overall volume increase estimated at about 1/3. Maximal thicknessnow measured at 2.5 cm compared with 2 cm previously. Morehyperdense component. Summary distribution, including along theposterior falx and the tentorium. Increased mass effect withleft-to-right shift of 7 mm today, increased from 4 mm previously.  2/20 KUB>no IVC filter in place  2/25 head CT>>difficult improvement in mass-effect, postoperative changes post left subdural hematoma evacuation. Rightward shift 4 mm (improved). Small volume subdural hemorrhage noted along the tentorium. New small area left superior frontal gyrus hypoattenuation  3/1 CT head Slight decrease in size of residual mixed density extra-axial hemorrhage over the left convexity. Stable to slightly improved midline shift.  No new hemorrhage.  Stable left-sided white matter infarcts. Chronic sinus disease.    I have personally reviewed and interpreted all radiology studies and my findings are as  above.  VENTILATOR SETTINGS:    Cultures 01/23/2020 Covid negative 01/23/2020 urine culture was polymicrobial 01/23/2020 blood cultures was negative 01/23/2020 MRSA was positive  Antimicrobials: Anti-infectives (From admission, onward)   Start     Stop   01/25/20 1050  ceFAZolin (ANCEF) IVPB 2g/100 mL premix     01/26/20 1744   01/23/20 0300  ceFEPIme (MAXIPIME) 2 g in sodium chloride 0.9 % 100 mL IVPB  Status:  Discontinued     01/23/20 0257   01/23/20 0300  ceFEPIme (MAXIPIME) 2 g in sodium chloride 0.9 % 100 mL IVPB  Status:  Discontinued     01/25/20 7829       Devices    LINES / TUBES:      Continuous Infusions: . sodium chloride Stopped (02/02/20 0535)  . sodium chloride    . levETIRAcetam 500 mg (02/05/20 1009)     Objective: Vitals:   02/06/20 2053 02/07/20 0006 02/07/20 0400 02/07/20 0800  BP: (!) 141/91 (!) 159/97 122/84 134/87  Pulse: 99 100 (!) 101 96  Resp: 17 15 17 17   Temp: 98.7 F (37.1 C) 98.2 F (36.8 C)  98.2 F (36.8 C)  TempSrc: Oral Oral  Oral  SpO2: 100% 99% 100% 100%  Weight:      Height:        Intake/Output Summary (Last 24 hours) at 02/07/2020 0908 Last data filed at 02/07/2020 0845 Gross per 24 hour  Intake 1249 ml  Output 2925 ml  Net -1676 ml   Filed Weights   01/23/20 1500 01/29/20 1200 02/03/20 0900  Weight: 110.3 kg 109.2 kg 109.3 kg    Examination:  General: A/O x1, (does not know where, when, why) no acute respiratory distress Eyes: negative scleral  hemorrhage, negative anisocoria, negative icterus ENT: Negative Runny nose, negative gingival bleeding, Neck:  Negative scars, masses, torticollis, lymphadenopathy, JVD Lungs: Clear to auscultation bilaterally without wheezes or crackles Cardiovascular: Regular rate and rhythm without murmur gallop or rub normal S1 and S2 Abdomen: negative abdominal pain, nondistended, positive soft, bowel sounds, no rebound, no ascites, no appreciable mass Extremities: No significant  cyanosis, clubbing, or edema bilateral lower extremities Skin: Negative rashes, lesions, ulcers Psychiatric: Unable to assess secondary to patient's altered mental status  Central nervous system:  Cranial nerves II through XII intact, tongue/uvula midline, all extremities muscle strength 5/5 on left side, RIGHT hemiparesis, sensation intact throughout,negative dysarthria, negative expressive aphasia, negative receptive aphasia.  .     Data Reviewed: Care during the described time interval was provided by me .  I have reviewed this patient's available data, including medical history, events of note, physical examination, and all test results as part of my evaluation.  CBC: Recent Labs  Lab 02/02/20 0653 02/03/20 0442 02/04/20 0637 02/05/20 0540 02/06/20 0632  WBC 8.2 7.2 6.4 5.9 5.5  HGB 8.8* 8.8* 9.1* 8.9* 8.6*  HCT 29.6* 29.9* 31.1* 29.7* 28.9*  MCV 94.9 95.2 94.0 93.7 92.6  PLT 238 257 221 242 381   Basic Metabolic Panel: Recent Labs  Lab 02/02/20 0653 02/03/20 0442 02/04/20 0637 02/05/20 0540 02/06/20 0632  NA 140 137 134* 135 134*  K 5.6* 5.9* 5.8* 5.1 5.2*  CL 109 107 102 104 104  CO2 22 21* 23 22 23   GLUCOSE 181* 281* 325* 323* 244*  BUN 29* 35* 41* 45* 41*  CREATININE 1.95* 1.92* 1.99* 2.11* 2.15*  CALCIUM 9.1 8.9 9.1 8.8* 8.9   GFR: Estimated Creatinine Clearance: 47.4 mL/min (A) (by C-G formula based on SCr of 2.15 mg/dL (H)). Liver Function Tests: No results for input(s): AST, ALT, ALKPHOS, BILITOT, PROT, ALBUMIN in the last 168 hours. No results for input(s): LIPASE, AMYLASE in the last 168 hours. No results for input(s): AMMONIA in the last 168 hours. Coagulation Profile: No results for input(s): INR, PROTIME in the last 168 hours. Cardiac Enzymes: No results for input(s): CKTOTAL, CKMB, CKMBINDEX, TROPONINI in the last 168 hours. BNP (last 3 results) No results for input(s): PROBNP in the last 8760 hours. HbA1C: No results for input(s): HGBA1C in  the last 72 hours. CBG: Recent Labs  Lab 02/06/20 1632 02/06/20 2102 02/07/20 0009 02/07/20 0434 02/07/20 0842  GLUCAP 134* 202* 243* 254* 184*   Lipid Profile: No results for input(s): CHOL, HDL, LDLCALC, TRIG, CHOLHDL, LDLDIRECT in the last 72 hours. Thyroid Function Tests: No results for input(s): TSH, T4TOTAL, FREET4, T3FREE, THYROIDAB in the last 72 hours. Anemia Panel: No results for input(s): VITAMINB12, FOLATE, FERRITIN, TIBC, IRON, RETICCTPCT in the last 72 hours. Sepsis Labs: No results for input(s): PROCALCITON, LATICACIDVEN in the last 168 hours.  No results found for this or any previous visit (from the past 240 hour(s)).       Radiology Studies: No results found.      Scheduled Meds: . chlorhexidine  15 mL Mouth Rinse BID  . Chlorhexidine Gluconate Cloth  6 each Topical Daily  . docusate  50 mg Oral Daily  . famotidine  20 mg Oral QHS  . feeding supplement (GLUCERNA SHAKE)  237 mL Oral TID BM  . feeding supplement (OSMOLITE 1.5 CAL)  1,050 mL Per Tube Q24H  . feeding supplement (PRO-STAT SUGAR FREE 64)  60 mL Oral BID  . heparin injection (subcutaneous)  5,000  Units Subcutaneous Q8H  . insulin aspart  0-9 Units Subcutaneous Q4H  . insulin detemir  28 Units Subcutaneous BID  . levETIRAcetam  500 mg Oral Q12H  . mouth rinse  15 mL Mouth Rinse BID  . mycophenolate  500 mg Oral BID  . sodium chloride flush  10-40 mL Intracatheter Q12H   Continuous Infusions: . sodium chloride Stopped (02/02/20 0535)  . sodium chloride    . levETIRAcetam 500 mg (02/05/20 1009)     LOS: 15 days    Time spent:40 min    Aravind Chrismer, Geraldo Docker, MD [facility] Pager [phone]  If 7PM-7AM, please contact night-coverage [email or web address] Password [email or web address] 02/07/2020, 9:08 AM

## 2020-02-08 LAB — CBC WITH DIFFERENTIAL/PLATELET
Abs Immature Granulocytes: 0.05 10*3/uL (ref 0.00–0.07)
Basophils Absolute: 0 10*3/uL (ref 0.0–0.1)
Basophils Relative: 1 %
Eosinophils Absolute: 0.6 10*3/uL — ABNORMAL HIGH (ref 0.0–0.5)
Eosinophils Relative: 11 %
HCT: 32.5 % — ABNORMAL LOW (ref 39.0–52.0)
Hemoglobin: 9.6 g/dL — ABNORMAL LOW (ref 13.0–17.0)
Immature Granulocytes: 1 %
Lymphocytes Relative: 17 %
Lymphs Abs: 1 10*3/uL (ref 0.7–4.0)
MCH: 27.7 pg (ref 26.0–34.0)
MCHC: 29.5 g/dL — ABNORMAL LOW (ref 30.0–36.0)
MCV: 93.9 fL (ref 80.0–100.0)
Monocytes Absolute: 0.6 10*3/uL (ref 0.1–1.0)
Monocytes Relative: 11 %
Neutro Abs: 3.4 10*3/uL (ref 1.7–7.7)
Neutrophils Relative %: 59 %
Platelets: 212 10*3/uL (ref 150–400)
RBC: 3.46 MIL/uL — ABNORMAL LOW (ref 4.22–5.81)
RDW: 16.1 % — ABNORMAL HIGH (ref 11.5–15.5)
WBC: 5.6 10*3/uL (ref 4.0–10.5)
nRBC: 0 % (ref 0.0–0.2)

## 2020-02-08 LAB — GLUCOSE, CAPILLARY
Glucose-Capillary: 158 mg/dL — ABNORMAL HIGH (ref 70–99)
Glucose-Capillary: 166 mg/dL — ABNORMAL HIGH (ref 70–99)
Glucose-Capillary: 170 mg/dL — ABNORMAL HIGH (ref 70–99)
Glucose-Capillary: 173 mg/dL — ABNORMAL HIGH (ref 70–99)
Glucose-Capillary: 177 mg/dL — ABNORMAL HIGH (ref 70–99)
Glucose-Capillary: 221 mg/dL — ABNORMAL HIGH (ref 70–99)

## 2020-02-08 LAB — PHOSPHORUS: Phosphorus: 4.1 mg/dL (ref 2.5–4.6)

## 2020-02-08 LAB — COMPREHENSIVE METABOLIC PANEL
ALT: 94 U/L — ABNORMAL HIGH (ref 0–44)
AST: 52 U/L — ABNORMAL HIGH (ref 15–41)
Albumin: 2.7 g/dL — ABNORMAL LOW (ref 3.5–5.0)
Alkaline Phosphatase: 91 U/L (ref 38–126)
Anion gap: 13 (ref 5–15)
BUN: 52 mg/dL — ABNORMAL HIGH (ref 6–20)
CO2: 24 mmol/L (ref 22–32)
Calcium: 8.8 mg/dL — ABNORMAL LOW (ref 8.9–10.3)
Chloride: 99 mmol/L (ref 98–111)
Creatinine, Ser: 2.19 mg/dL — ABNORMAL HIGH (ref 0.61–1.24)
GFR calc Af Amer: 37 mL/min — ABNORMAL LOW (ref >60)
GFR calc non Af Amer: 32 mL/min — ABNORMAL LOW (ref >60)
Glucose, Bld: 168 mg/dL — ABNORMAL HIGH (ref 70–99)
Potassium: 4.2 mmol/L (ref 3.5–5.1)
Sodium: 136 mmol/L (ref 135–145)
Total Bilirubin: 0.6 mg/dL (ref 0.3–1.2)
Total Protein: 6.9 g/dL (ref 6.5–8.1)

## 2020-02-08 LAB — MAGNESIUM: Magnesium: 2 mg/dL (ref 1.7–2.4)

## 2020-02-08 NOTE — Progress Notes (Signed)
PROGRESS NOTE    Brendan [name]  EZM:[id number] DOB: Aug 18, 1961 DOA: 01/22/2020 PCP: Cleda Mccreedy, MD     Brief Narrative:  Brendan [name] is 59 y.o.BM PMHx Hx of DVT previously on Xarelto, now on lovenox,   who came into the ED from skilled due to worsening left-sided weakness and aphasia he was noted to have some new hematuria in the setting of obstructive uropathy with chronic nephrostomy tubes CT of the head was done that showed an acute on chronic subdural hematoma with encephalomalacia admitted by [facility] neurosurgery was consulted, his Lovenox was reversed with protamine on admission.  Subjective: 3/7A/O x1 (does not know where, when, why), follows commands.  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 Neurosurgery and neurology recommended [place] for seizure precautions. Speech evaluated the patient and recommended a dysphagia 3 diet. Plan of care has been consulted and the patient remains full code with full scope of treatment. I had a long discussion with the son about goals of care and end-of-life, the son who is the next of kin does not want to proceed with DNR/DNI. He would like to continue aggressive treatment knowing that his medical condition will continue to deteriorate. I have also explained to him the danger of having him on full dose anticoagulation with a hematoma with a probable old PE.  And he has agreed to continue Lovenox prophylactically and not the full dose.  I have explained the risk and benefits of doing this and he is in agreement. Discontinue telemetry.  Acute metabolic  encephalopathy: Likely secondary to #1 with residual lethargy and aphasia consistent with the location of the hematoma, he is slowly improving try to minimize sedatives and hold continue conservative management with medial metabolic rearrangements. Physical therapy has evaluated the patient and recommended skilled nursing facility.  Right DVT status post treatment with enoxaparin due to Xarelto failure: Vascular surgery attempted to place an IVC filter on 01/13/2020 and was unsuccessful. The case was reviewed with Dr. 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 Per neurosurgery is okay to resume anticoagulation however after discussing the case PCCM currently not restarting full anticoagulation weight the benefits of restarting systemic full dose anticoagulation giving the acute on chronic nature of a subdural hematoma.  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  Anemia acute on chronic multifactorial in the setting of chronic disease: Hemoglobin is stable continue to monitor.  Hyperglycemia: Blood glucose is trending up he is not on steroids or medications document his blood glucose global change his sliding scale continue Lantus,Check A1c  Nutrition: Patient with minimal eating, currently has a core track. We will give her 1 additional day if his nutrition continues to be poor, we will have to discuss with the family the need of a PEG tube. On my previous conversations with the  family they appeared to want aggressive therapy.  Last hospice and palliative care discussed with family the need of a PEG  tube.  Advanced care planning/counseling discussion/Goals of care, counseling/discussion Palliative care by specialist: PMT meeting with family.  RN Pressure Injury Documentation:  Estimated body mass index is 31.79 kg/m as calculated from the following:   Height as of this encounter: 6\' 1"  (1.854 m).   Weight as of this encounter: 109.3 kg. Malnutrition Type:  Nutrition Problem: Inadequate oral intake Etiology: decreased appetite   Malnutrition Characteristics:  Signs/Symptoms: meal completion < 50%   Nutrition Interventions:  Interventions: Tube feeding, Prostat      DVT prophylaxis: Subcu heparin Code Status: Full code Family Communication:  Disposition Plan:    Consultants:    Procedures/Significant Events:  01/22/2020 admission 01/26/2020 left nephrostomy tube exchange 01/26/2020 left craniotomy for subdural hematoma with evacuation 01/29/2020 the left upper extremity midline 2/21 CT Head>Some further enlargement of the subdural hematoma on the left.Overall volume increase estimated at about 1/3. Maximal thicknessnow measured at 2.5 cm compared with 2 cm previously. Morehyperdense component. Summary distribution, including along theposterior falx and the tentorium. Increased mass effect withleft-to-right shift of 7 mm today, increased from 4 mm previously.  2/20 KUB>no IVC filter in place  2/25 head CT>>difficult improvement in mass-effect, postoperative changes post left subdural hematoma evacuation. Rightward shift 4 mm (improved). Small volume subdural hemorrhage noted along the tentorium. New small area left superior frontal gyrus hypoattenuation  3/1 CT head Slight decrease in size of residual mixed density extra-axial hemorrhage over the left convexity. Stable to slightly improved midline shift.  No new hemorrhage.  Stable left-sided white matter infarcts. Chronic sinus disease.    I have personally reviewed and interpreted all  radiology studies and my findings are as above.  VENTILATOR SETTINGS:    Cultures 01/23/2020 Covid negative 01/23/2020 urine culture was polymicrobial 01/23/2020 blood cultures was negative 01/23/2020 MRSA was positive  Antimicrobials: Anti-infectives (From admission, onward)   Start     Stop   01/25/20 1050  ceFAZolin (ANCEF) IVPB 2g/100 mL premix     01/26/20 1744   01/23/20 0300  ceFEPIme (MAXIPIME) 2 g in sodium chloride 0.9 % 100 mL IVPB  Status:  Discontinued     01/23/20 0257   01/23/20 0300  ceFEPIme (MAXIPIME) 2 g in sodium chloride 0.9 % 100 mL IVPB  Status:  Discontinued     01/25/20 2025       Devices    LINES / TUBES:      Continuous Infusions: . sodium chloride Stopped (02/02/20 0535)  . sodium chloride    . levETIRAcetam 500 mg (02/05/20 1009)     Objective: Vitals:   02/08/20 0800 02/08/20 1219 02/08/20 1233 02/08/20 1643  BP: 122/87 115/79 132/85 113/79  Pulse: (!) 107 (!) 106 (!) 108 (!) 101  Resp: 16 13 (!) 21   Temp: 98.6 F (37 C) 97.6 F (36.4 C) 98.1 F (36.7 C) 98.9 F (37.2 C)  TempSrc: Oral Oral  Oral  SpO2: 100% 98% 97% 100%  Weight:      Height:        Intake/Output Summary (Last 24 hours) at 02/08/2020 1820 Last data filed at 02/08/2020 1645 Gross per 24 hour  Intake 1030 ml  Output 2625 ml  Net -1595 ml   Filed Weights   01/23/20 1500 01/29/20 1200 02/03/20 0900  Weight: 110.3 kg 109.2 kg 109.3 kg    Examination:  General: A/O x1, (does not  know where, when, why) no acute respiratory distress Eyes: negative scleral hemorrhage, negative anisocoria, negative icterus ENT: Negative Runny nose, negative gingival bleeding, Neck:  Negative scars, masses, torticollis, lymphadenopathy, JVD Lungs: Clear to auscultation bilaterally without wheezes or crackles Cardiovascular: Regular rate and rhythm without murmur gallop or rub normal S1 and S2 Abdomen: negative abdominal pain, nondistended, positive soft, bowel sounds, no  rebound, no ascites, no appreciable mass Extremities: No significant cyanosis, clubbing, or edema bilateral lower extremities Skin: Negative rashes, lesions, ulcers Psychiatric: Unable to assess secondary to patient's altered mental status  Central nervous system:  Cranial nerves II through XII intact, tongue/uvula midline, all extremities muscle strength 5/5 on left side, RIGHT hemiparesis, sensation intact throughout,negative dysarthria, negative expressive aphasia, negative receptive aphasia.  .     Data Reviewed: Care during the described time interval was provided by me .  I have reviewed this patient's available data, including medical history, events of note, physical examination, and all test results as part of my evaluation.  CBC: Recent Labs  Lab 02/03/20 0442 02/04/20 0637 02/05/20 0540 02/06/20 0632 02/08/20 0507  WBC 7.2 6.4 5.9 5.5 5.6  NEUTROABS  --   --   --   --  3.4  HGB 8.8* 9.1* 8.9* 8.6* 9.6*  HCT 29.9* 31.1* 29.7* 28.9* 32.5*  MCV 95.2 94.0 93.7 92.6 93.9  PLT 257 221 242 240 381   Basic Metabolic Panel: Recent Labs  Lab 02/03/20 0442 02/04/20 0637 02/05/20 0540 02/06/20 0632 02/08/20 0507  NA 137 134* 135 134* 136  K 5.9* 5.8* 5.1 5.2* 4.2  CL 107 102 104 104 99  CO2 21* 23 22 23 24   GLUCOSE 281* 325* 323* 244* 168*  BUN 35* 41* 45* 41* 52*  CREATININE 1.92* 1.99* 2.11* 2.15* 2.19*  CALCIUM 8.9 9.1 8.8* 8.9 8.8*  MG  --   --   --   --  2.0  PHOS  --   --   --   --  4.1   GFR: Estimated Creatinine Clearance: 46.5 mL/min (A) (by C-G formula based on SCr of 2.19 mg/dL (H)). Liver Function Tests: Recent Labs  Lab 02/08/20 0507  AST 52*  ALT 94*  ALKPHOS 91  BILITOT 0.6  PROT 6.9  ALBUMIN 2.7*   No results for input(s): LIPASE, AMYLASE in the last 168 hours. No results for input(s): AMMONIA in the last 168 hours. Coagulation Profile: No results for input(s): INR, PROTIME in the last 168 hours. Cardiac Enzymes: No results for input(s):  CKTOTAL, CKMB, CKMBINDEX, TROPONINI in the last 168 hours. BNP (last 3 results) No results for input(s): PROBNP in the last 8760 hours. HbA1C: No results for input(s): HGBA1C in the last 72 hours. CBG: Recent Labs  Lab 02/08/20 0046 02/08/20 0358 02/08/20 0738 02/08/20 1236 02/08/20 1642  GLUCAP 170* 158* 177* 221* 166*   Lipid Profile: No results for input(s): CHOL, HDL, LDLCALC, TRIG, CHOLHDL, LDLDIRECT in the last 72 hours. Thyroid Function Tests: No results for input(s): TSH, T4TOTAL, FREET4, T3FREE, THYROIDAB in the last 72 hours. Anemia Panel: No results for input(s): VITAMINB12, FOLATE, FERRITIN, TIBC, IRON, RETICCTPCT in the last 72 hours. Sepsis Labs: No results for input(s): PROCALCITON, LATICACIDVEN in the last 168 hours.  No results found for this or any previous visit (from the past 240 hour(s)).       Radiology Studies: No results found.      Scheduled Meds: . chlorhexidine  15 mL Mouth Rinse BID  . Chlorhexidine Gluconate  Cloth  6 each Topical Daily  . docusate  50 mg Oral Daily  . famotidine  20 mg Oral QHS  . feeding supplement (NEPRO CARB STEADY)  1,000 mL Oral Q24H  . feeding supplement (NEPRO CARB STEADY)  237 mL Oral BID BM  . feeding supplement (PRO-STAT SUGAR FREE 64)  30 mL Oral BID  . heparin injection (subcutaneous)  5,000 Units Subcutaneous Q8H  . insulin aspart  0-9 Units Subcutaneous Q4H  . insulin detemir  28 Units Subcutaneous BID  . levETIRAcetam  500 mg Oral Q12H  . mouth rinse  15 mL Mouth Rinse BID  . mycophenolate  500 mg Oral BID  . sodium chloride flush  10-40 mL Intracatheter Q12H   Continuous Infusions: . sodium chloride Stopped (02/02/20 0535)  . sodium chloride    . levETIRAcetam 500 mg (02/05/20 1009)     LOS: 16 days    Time spent:40 min    Janye Maynor, Geraldo Docker, MD [facility] Pager [phone]  If 7PM-7AM, please contact night-coverage [email or web address] Password [email or web address] 02/08/2020, 6:20 PM

## 2020-02-08 NOTE — Progress Notes (Signed)
Neurosurgery   Pt seen and examined.  Awake, alert.  Ox2.  R hemiplegia  - awaiting placement

## 2020-02-08 NOTE — Progress Notes (Signed)
Patient has asked for pain medicine a couple of times tonight.  And heart rate is high

## 2020-02-09 DIAGNOSIS — Z515 Encounter for palliative care: Secondary | ICD-10-CM

## 2020-02-09 DIAGNOSIS — Z7189 Other specified counseling: Secondary | ICD-10-CM

## 2020-02-09 LAB — CBC WITH DIFFERENTIAL/PLATELET
Abs Immature Granulocytes: 0.03 10*3/uL (ref 0.00–0.07)
Basophils Absolute: 0 10*3/uL (ref 0.0–0.1)
Basophils Relative: 1 %
Eosinophils Absolute: 0.7 10*3/uL — ABNORMAL HIGH (ref 0.0–0.5)
Eosinophils Relative: 11 %
HCT: 32.4 % — ABNORMAL LOW (ref 39.0–52.0)
Hemoglobin: 10.2 g/dL — ABNORMAL LOW (ref 13.0–17.0)
Immature Granulocytes: 1 %
Lymphocytes Relative: 15 %
Lymphs Abs: 0.9 10*3/uL (ref 0.7–4.0)
MCH: 28.3 pg (ref 26.0–34.0)
MCHC: 31.5 g/dL (ref 30.0–36.0)
MCV: 89.8 fL (ref 80.0–100.0)
Monocytes Absolute: 0.5 10*3/uL (ref 0.1–1.0)
Monocytes Relative: 9 %
Neutro Abs: 3.9 10*3/uL (ref 1.7–7.7)
Neutrophils Relative %: 63 %
Platelets: UNDETERMINED 10*3/uL (ref 150–400)
RBC: 3.61 MIL/uL — ABNORMAL LOW (ref 4.22–5.81)
RDW: 16.5 % — ABNORMAL HIGH (ref 11.5–15.5)
WBC: 6.1 10*3/uL (ref 4.0–10.5)
nRBC: 0.3 % — ABNORMAL HIGH (ref 0.0–0.2)

## 2020-02-09 LAB — COMPREHENSIVE METABOLIC PANEL
ALT: 69 U/L — ABNORMAL HIGH (ref 0–44)
AST: 53 U/L — ABNORMAL HIGH (ref 15–41)
Albumin: 2.6 g/dL — ABNORMAL LOW (ref 3.5–5.0)
Alkaline Phosphatase: 80 U/L (ref 38–126)
Anion gap: 14 (ref 5–15)
BUN: 50 mg/dL — ABNORMAL HIGH (ref 6–20)
CO2: 21 mmol/L — ABNORMAL LOW (ref 22–32)
Calcium: 8.8 mg/dL — ABNORMAL LOW (ref 8.9–10.3)
Chloride: 100 mmol/L (ref 98–111)
Creatinine, Ser: 2.28 mg/dL — ABNORMAL HIGH (ref 0.61–1.24)
GFR calc Af Amer: 35 mL/min — ABNORMAL LOW (ref >60)
GFR calc non Af Amer: 30 mL/min — ABNORMAL LOW (ref >60)
Glucose, Bld: 180 mg/dL — ABNORMAL HIGH (ref 70–99)
Potassium: 4.7 mmol/L (ref 3.5–5.1)
Sodium: 135 mmol/L (ref 135–145)
Total Bilirubin: 1.2 mg/dL (ref 0.3–1.2)
Total Protein: 6.8 g/dL (ref 6.5–8.1)

## 2020-02-09 LAB — GLUCOSE, CAPILLARY
Glucose-Capillary: 120 mg/dL — ABNORMAL HIGH (ref 70–99)
Glucose-Capillary: 130 mg/dL — ABNORMAL HIGH (ref 70–99)
Glucose-Capillary: 155 mg/dL — ABNORMAL HIGH (ref 70–99)
Glucose-Capillary: 158 mg/dL — ABNORMAL HIGH (ref 70–99)
Glucose-Capillary: 161 mg/dL — ABNORMAL HIGH (ref 70–99)
Glucose-Capillary: 188 mg/dL — ABNORMAL HIGH (ref 70–99)
Glucose-Capillary: 191 mg/dL — ABNORMAL HIGH (ref 70–99)

## 2020-02-09 LAB — PHOSPHORUS: Phosphorus: 4.2 mg/dL (ref 2.5–4.6)

## 2020-02-09 LAB — MAGNESIUM: Magnesium: 2 mg/dL (ref 1.7–2.4)

## 2020-02-09 MED ORDER — PHENYLEPHRINE HCL-NACL 10-0.9 MG/250ML-% IV SOLN
INTRAVENOUS | Status: AC
  Filled 2020-02-09: qty 250

## 2020-02-09 NOTE — Progress Notes (Signed)
Patient ID: Brendan [name], male   DOB: [birth year], 59 y.o.   MRN: [id number] BP 123/82 (BP Location: Left Arm)   Pulse 77   Temp 98.1 F (36.7 C)   Resp 18   Ht 6\' 1"  (1.854 m)   Wt 109.3 kg   SpO2 98%   BMI 31.79 kg/m  Alert, responsive to questions,follows commands Wound is clean, dry, no signs of infection At baseline. Awaiting placement.

## 2020-02-09 NOTE — Progress Notes (Signed)
Physical Therapy Treatment Patient Details Name: Brendan [name] MRN: [id number] DOB: [birth year] Today's Date: 02/09/2020    History of Present Illness 59 year old nursing home resident with DM, L CVA 2009, and CAD who is chronically anticoagulated with Lovenox due to the presence of a right lower extremity DVT despite the fact that he has a IVC filter in place.  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  He came to medical attention at his [place] home because of aphasia and weakness.  A CT scan of the head has shown acute on chronic subdural hematoma., pt s/p evacuation of L SDH 01/26/20    PT Comments    Pt does not want to mobilize, refusing getting OOB to chair or trying to stand. Eventually coaxed by PT and OT to sit up EOB to try to eat a little lunch. Pt tried a bit of meatloaf and cake and did not care for either and would not eat more. Pt tolerated sitting x20 mins with supervision. RLE swelling and tenderness to touch noted. Decreased frequency to 2x/wk due to pt's lack of participation. PT will continue to follow.    Follow Up Recommendations  SNF;Supervision/Assistance - 24 hour     Equipment Recommendations  None recommended by PT    Recommendations for Other Services       Precautions / Restrictions Precautions Precautions: Fall Precaution Comments: R hemiplegia, cortrak, nephrostomy drain Restrictions Weight Bearing Restrictions: No Other Position/Activity Restrictions: cortrack, nephrostomy tube    Mobility  Bed Mobility Overal bed mobility: Needs Assistance Bed Mobility: Supine to Sit;Sit to Supine     Supine to sit: Min assist;HOB elevated;+2 for safety/equipment Sit to supine: +2 for physical assistance;Min assist   General bed mobility comments: once pt agreed to sit up EOB, he was able to rise from [facility] elevated position with min A to RLE. Pt initiated return to bed after he had  eaten a small amount. Min A for LE's into bed  Transfers                 General transfer comment: pt refused  Ambulation/Gait             General Gait Details: not tested   Stairs             Wheelchair Mobility    Modified Rankin (Stroke Patients Only) Modified Rankin (Stroke Patients Only) Pre-Morbid Rankin Score: Moderately severe disability Modified Rankin: Severe disability     Balance Overall balance assessment: Needs assistance Sitting-balance support: Single extremity supported;No upper extremity supported Sitting balance-Leahy Scale: Fair Sitting balance - Comments: Supervision, pt able to wt shift in sitting to scoot hips back in bed       Standing balance comment: pt refused                             Cognition Arousal/Alertness: Awake/alert Behavior During Therapy: Flat affect Overall Cognitive Status: (NT formally, unable to determine) Area of Impairment: Following commands;Safety/judgement;Problem solving                       Following Commands: Follows one step commands inconsistently;Follows one step commands with increased time Safety/Judgement: Decreased awareness of safety;Decreased awareness of deficits Awareness: Intellectual Problem Solving: Slow processing;Requires verbal cues;Requires tactile cues General Comments: appears significantly depressed, does not want to participate. Processing is slow but sometimes  does not follow commands because he does not want to      Exercises      General Comments General comments (skin integrity, edema, etc.): VSS, pt did not want lunch but once food brought to him, he ate a couple of bites. Reports that nothing has flavor to him but that he would take an ensure      Pertinent Vitals/Pain Pain Assessment: Faces Faces Pain Scale: Hurts a little bit Pain Location: RLE swelling Pain Descriptors / Indicators: Grimacing Pain Intervention(s): Monitored during  session    Home Living                      Prior Function            PT Goals (current goals can now be found in the care plan section) Acute Rehab PT Goals Patient Stated Goal: unable to state PT Goal Formulation: Patient unable to participate in goal setting Time For Goal Achievement: 02/13/20 Potential to Achieve Goals: Fair Progress towards PT goals: Not progressing toward goals - comment(not wanting to mobilize)    Frequency    Min 2X/week      PT Plan Current plan remains appropriate    Co-evaluation PT/OT/SLP Co-Evaluation/Treatment: Yes Reason for Co-Treatment: Complexity of the patient's impairments (multi-system involvement);Necessary to address cognition/behavior during functional activity;For patient/therapist safety PT goals addressed during session: Mobility/safety with mobility;Balance        AM-PAC PT "6 Clicks" Mobility   Outcome Measure  Help needed turning from your back to your side while in a flat bed without using bedrails?: A Little Help needed moving from lying on your back to sitting on the side of a flat bed without using bedrails?: A Little Help needed moving to and from a bed to a chair (including a wheelchair)?: Total Help needed standing up from a chair using your arms (e.g., wheelchair or bedside chair)?: Total Help needed to walk in hospital room?: Total Help needed climbing 3-5 steps with a railing? : Total 6 Click Score: 10    End of Session   Activity Tolerance: Other (comment)(limited by depressed mood) Patient left: in bed;with call bell/phone within reach;with bed alarm set Nurse Communication: Mobility status PT Visit Diagnosis: Other abnormalities of gait and mobility (R26.89);Muscle weakness (generalized) (M62.81);Hemiplegia and hemiparesis Hemiplegia - Right/Left: Right Hemiplegia - dominant/non-dominant: Dominant Hemiplegia - caused by: Nontraumatic intracerebral hemorrhage     Time: 1239-1307 PT Time  Calculation (min) (ACUTE ONLY): 28 min  Charges:  $Therapeutic Activity: 8-22 mins                     Leighton Roach, [place]  Pager [phone] Office [place] 02/09/2020, 2:08 PM

## 2020-02-09 NOTE — Progress Notes (Signed)
Occupational Therapy Treatment Patient Details Name: Brendan [name] MRN: [id number] DOB: 1961/08/22 Today's Date: 02/09/2020    History of present illness 59 year old nursing home resident with DM, L CVA 2009, and CAD who is chronically anticoagulated with Lovenox due to the presence of a right lower extremity DVT despite the fact that he has a IVC filter in place.  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  He came to medical attention at his [place] home because of aphasia and weakness.  A CT scan of the head has shown acute on chronic subdural hematoma., pt s/p evacuation of L SDH 01/26/20   OT comments  Pt presenting with decreased volition and requiring increased encouragement. Pt requiring Min A +2 for bed mobility. Sitting at EOB, pt engaging in self feeding task with Min A and only taking a few bites. Pt agreeable to apply lotion to bilateral thighs and then RUE. Continue to recommend dc to SNF and will continue to follow acutely as admitted.    Follow Up Recommendations  SNF;Supervision/Assistance - 24 hour    Equipment Recommendations  None recommended by OT    Recommendations for Other Services PT consult    Precautions / Restrictions Precautions Precautions: Fall Precaution Comments: R hemiplegia, cortrak, nephrostomy drain Restrictions Weight Bearing Restrictions: No Other Position/Activity Restrictions: cortrack, nephrostomy tube       Mobility Bed Mobility Overal bed mobility: Needs Assistance Bed Mobility: Supine to Sit;Sit to Supine     Supine to sit: Min assist;HOB elevated;+2 for safety/equipment;+2 for physical assistance Sit to supine: +2 for physical assistance;Min assist   General bed mobility comments: once pt agreed to sit up EOB, he was able to rise from [facility] elevated position with min A to RLE. Pt initiated return to bed after he had eaten a small amount. Min A for LE's into  bed  Transfers Overall transfer level: Needs assistance               General transfer comment: pt refused    Balance Overall balance assessment: Needs assistance Sitting-balance support: Single extremity supported;No upper extremity supported Sitting balance-Leahy Scale: Fair Sitting balance - Comments: Supervision, pt able to wt shift in sitting to scoot hips back in bed       Standing balance comment: pt refused                            ADL either performed or assessed with clinical judgement   ADL Overall ADL's : Needs assistance/impaired Eating/Feeding: Minimal assistance;Sitting Eating/Feeding Details (indicate cue type and reason): Min A for self feeding     Upper Body Bathing: Moderate assistance;Sitting Upper Body Bathing Details (indicate cue type and reason): Having pt apply lotion to RUE with LUE  Lower Body Bathing: Maximal assistance;Bed level Lower Body Bathing Details (indicate cue type and reason): Having pt apply lotion to bilateral thighs                       General ADL Comments: Pt sitting at EOB with Min A for self feeding     Vision       Perception     Praxis      Cognition Arousal/Alertness: Awake/alert Behavior During Therapy: Flat affect Overall Cognitive Status: (NT formally, unable to determine) Area of Impairment: Following commands;Safety/judgement;Problem solving  Following Commands: Follows one step commands inconsistently;Follows one step commands with increased time Safety/Judgement: Decreased awareness of safety;Decreased awareness of deficits Awareness: Intellectual Problem Solving: Slow processing;Requires verbal cues;Requires tactile cues General Comments: appears significantly depressed, does not want to participate. Processing is slow but sometimes does not follow commands because he does not want to        Exercises     Shoulder Instructions       General  Comments VSS    Pertinent Vitals/ Pain       Pain Assessment: Faces Faces Pain Scale: Hurts a little bit Pain Location: RLE swelling Pain Descriptors / Indicators: Grimacing Pain Intervention(s): Monitored during session;Limited activity within patient's tolerance;Repositioned  Home Living                                          Prior Functioning/Environment              Frequency  Min 2X/week        Progress Toward Goals  OT Goals(current goals can now be found in the care plan section)  Progress towards OT goals: Not progressing toward goals - comment(Self limiting)  Acute Rehab OT Goals Patient Stated Goal: unable to state OT Goal Formulation: With patient Time For Goal Achievement: 02/08/20 Potential to Achieve Goals: Good ADL Goals Pt Will Perform Grooming: with min assist;sitting Pt Will Perform Upper Body Dressing: with min assist;sitting Pt Will Transfer to Toilet: with min assist;bedside commode;stand pivot transfer Pt Will Perform Toileting - Clothing Manipulation and hygiene: with mod assist;sit to/from stand;sitting/lateral leans Additional ADL Goal #1: Pt will perform bed mobility with Min Guard A in preparation for ADLs  Plan Discharge plan remains appropriate    Co-evaluation    PT/OT/SLP Co-Evaluation/Treatment: Yes Reason for Co-Treatment: For patient/therapist safety;To address functional/ADL transfers   OT goals addressed during session: ADL's and self-care      AM-PAC OT "6 Clicks" Daily Activity     Outcome Measure   Help from another person eating meals?: A Lot Help from another person taking care of personal grooming?: A Lot Help from another person toileting, which includes using toliet, bedpan, or urinal?: A Lot Help from another person bathing (including washing, rinsing, drying)?: A Lot Help from another person to put on and taking off regular upper body clothing?: A Lot Help from another person to put on and  taking off regular lower body clothing?: A Lot 6 Click Score: 12    End of Session    OT Visit Diagnosis: Unsteadiness on feet (R26.81);Other abnormalities of gait and mobility (R26.89);Muscle weakness (generalized) (M62.81);Other symptoms and signs involving cognitive function;Hemiplegia and hemiparesis Hemiplegia - Right/Left: Right   Activity Tolerance Other (comment)(pt self limiting today)   Patient Left in bed;with call bell/phone within reach;with bed alarm set   Nurse Communication Mobility status        Time: 4034-7425 OT Time Calculation (min): 24 min  Charges: OT General Charges $OT Visit: 1 Visit OT Treatments $Self Care/Home Management : 8-22 mins  [place], OTR/L Acute Rehab Pager: [phone] Office: [place] 02/09/2020, 6:07 PM

## 2020-02-09 NOTE — Progress Notes (Addendum)
Daily Progress Note   [name]       Date: 02/09/2020 DOB: 24-May-1961  Age: 59 y.o. MRN#: [id number] Attending Physician: Allie Bossier, MD Primary Care Physician: Cleda Mccreedy, MD Admit Date: 01/22/2020  Reason for Consultation/Follow-up: Establishing goals of care  Subjective: Evaluated patient at bedside. He opened his eyes slightly, but otherwise did not respond to me.  I visited him again and video conferenced using Facetime on iPad with his mother and sister. Patient was bright and alert, smiling and laughing with his family. His conversation was limited to one word responses to his family's questions, but they were appropriate.  I discussed [place] with patient's sister and mother- they are primarily concerned with trying to get to [facility] so they can see Day in person before making any big goals of care decisions, including feeding tube discussion. Currently their plan is to arrive this coming weekend.    Review of Systems  Unable to perform ROS: Mental acuity    Length of Stay: 17  Current Medications: Scheduled Meds:  . chlorhexidine  15 mL Mouth Rinse BID  . Chlorhexidine Gluconate Cloth  6 each Topical Daily  . docusate  50 mg Oral Daily  . famotidine  20 mg Oral QHS  . feeding supplement (NEPRO CARB STEADY)  1,000 mL Oral Q24H  . feeding supplement (NEPRO CARB STEADY)  237 mL Oral BID BM  . feeding supplement (PRO-STAT SUGAR FREE 64)  30 mL Oral BID  . heparin injection (subcutaneous)  5,000 Units Subcutaneous Q8H  . insulin aspart  0-9 Units Subcutaneous Q4H  . insulin detemir  28 Units Subcutaneous BID  . levETIRAcetam  500 mg Oral Q12H  . mouth rinse  15 mL Mouth Rinse BID  . mycophenolate  500 mg Oral BID  . sodium chloride flush  10-40 mL  Intracatheter Q12H    Continuous Infusions: . sodium chloride Stopped (02/02/20 0535)  . sodium chloride    . levETIRAcetam 500 mg (02/05/20 1009)    PRN Meds: Place/Maintain arterial line **AND** sodium chloride, acetaminophen (TYLENOL) oral liquid 160 mg/5 mL, hydrALAZINE, labetalol, lidocaine (PF), oxyCODONE, polyethylene glycol, sodium chloride flush  Physical Exam Vitals and nursing note reviewed.  Constitutional:      General: He is not in acute distress. HENT:  Nose:     Comments: NG tube in place Cardiovascular:     Pulses: Normal pulses.     Comments: RLE edema Neurological:     Comments: R side hemiplegia             Vital Signs: BP 114/83 (BP Location: Right Arm)   Pulse (!) 103   Temp 98.6 F (37 C) (Oral)   Resp 16   Ht 6\' 1"  (1.854 m)   Wt 109.3 kg   SpO2 97%   BMI 31.79 kg/m  SpO2: SpO2: 97 % O2 Device: O2 Device: Room Air O2 Flow Rate:    Intake/output summary:   Intake/Output Summary (Last 24 hours) at 02/09/2020 1615 Last data filed at 02/09/2020 1300 Gross per 24 hour  Intake 320 ml  Output 1725 ml  Net -1405 ml   LBM: Last BM Date: 02/08/20 Baseline Weight: Weight: 112 kg Most recent weight: Weight: 109.3 kg       Palliative Assessment/Data: PPS: 20%      Patient Active Problem List   Diagnosis Date Noted  . Advanced care planning/counseling discussion   . Goals of care, counseling/discussion   . Palliative care by specialist   . Subdural hematoma, acute ([place]) 01/26/2020  . SDH (subdural hematoma) ([place]) 01/23/2020  . DVT (deep venous thrombosis) ([place]) 12/19/2019  . Right leg DVT ([place]) 12/18/2019  . Symptomatic anemia 03/22/2018  . History of DVT in adulthood 03/22/2018  . Gross hematuria   . Nephrostomy tube displaced ([place]) 07/19/2017  . Pyelonephritis 02/16/2017  . 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 Coronary atherosclerosis of native coronary artery   . Essential hypertension   . Stroke ([place])   . Diabetes mellitus  without complication ([place])   . Chronic kidney disease, stage IV (severe) ([place])   . GERD (gastroesophageal reflux disease)   . Glaucoma   . Hemiparesis affecting right side as late effect of cerebrovascular accident ([place])   . Enlarged prostate with urinary obstruction   . Constipation     Palliative Care Assessment & Plan   Patient Profile: 59 y.o.malewith past medical history of retroperitoneal fibrosis with chronic percutaneous nephrostomy, DVT on Lovenox, previous CVA, CKD IIIwho was admitted on2/18/2021with acute on chronic subdural hematoma and underwent evacuation on 2/21.  Assessment/Recommendations/Plan   Continue full code, full scope care  Plan made for video call again tomorrow  Goals of Care and Additional Recommendations:  Limitations on Scope of Treatment: Full Scope Treatment  Code Status:  Full code  Prognosis:   Unable to determine  Discharge Planning:  To Be Determined  Care plan was discussed with patient's family and nurse.   Thank you for allowing the Palliative Medicine Team to assist in the care of this patient.   Time In: 1515 Time Out: 1620 Total Time 65 minutes Prolonged Time Billed yes      Greater than 50%  of this time was spent counseling and coordinating care related to the above assessment and plan.  Mariana Kaufman, AGNP-C Palliative Medicine   Please contact Palliative Medicine Team phone at [phone] for questions and concerns.

## 2020-02-09 NOTE — Progress Notes (Signed)
PROGRESS NOTE    Brendan [name]  DSK:[id number] DOB: 07/02/1961 DOA: 01/22/2020 PCP: Cleda Mccreedy, MD     Brief Narrative:  Brendan [name] is 60 y.o.BM PMHx Hx of DVT previously on Xarelto, now on lovenox,   who came into the ED from skilled due to worsening left-sided weakness and aphasia he was noted to have some new hematuria in the setting of obstructive uropathy with chronic nephrostomy tubes CT of the head was done that showed an acute on chronic subdural hematoma with encephalomalacia admitted by [facility] neurosurgery was consulted, his Lovenox was reversed with protamine on admission.  Subjective: 3/8 A/O x1 (does not know when, where, why).  Follows commands.   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 -Neurosurgery and neurology recommended [facility] for seizure precautions. -Speech evaluated the patient and recommended a dysphagia 3 diet. -long discussion with the son was conducted about goals of care and end-of-life, the son who is the next of kin does not want to proceed with DNR/DNI. He would like to continue aggressive treatment knowing that his medical condition will continue to deteriorate. I have also explained to him the danger of having him on full dose anticoagulation with a hematoma with a probable old PE.  And he has agreed to continue Lovenox prophylactically and not the full dose.  I have explained the risk and benefits of doing this and he is in agreement. -Discontinue telemetry.  Acute metabolic encephalopathy: -Likely secondary to #1 with residual lethargy and aphasia consistent with the location of the hematoma, he  is slowly improving  -Minimize sedatives and hold continue conservative management with medial metabolic rearrangements. -Physical therapy has evaluated the patient and recommended skilled nursing facility.  Acute Right DVT s/p treatment with Enoxaparin due to Xarelto failure: -Vascular surgery attempted to place an IVC filter on 01/13/2020 and was unsuccessful. -The case was reviewed with Dr. 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 -Per neurosurgery is okay to resume anticoagulation however after discussing the case PCCM currently not restarting full anticoagulation weight the benefits of restarting systemic full dose anticoagulation giving the acute on chronic nature of a subdural hematoma.  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  Anemia acute on chronic multifactorial in the setting of chronic disease: Recent Labs  Lab 02/05/20 0540 02/06/20 6203 02/08/20 0507 02/09/20 0415 02/10/20 0500  HGB 8.9* 8.6* 9.6* 10.2* 9.4*  -Stable  Hyperglycemia: -2/20 hemoglobin A1c = 5.0; not consistent with prediabetes/diabetes  Nutrition: -Patient with minimal eating, currently has a core track. -We will give her 1 additional day if his nutrition continues to be poor, we will have to discuss with the family the need of a PEG tube. -On my previous conversations with the family they appeared to want aggressive therapy.  Last hospice and palliative care discussed with family the need of a PEG  tube.  Advanced care planning/counseling discussion/Goals of care, counseling/discussion Palliative care by  specialist: PMT meeting with family.  RN Pressure Injury Documentation:  Estimated body mass index is 31.79 kg/m as calculated from the following:   Height as of this encounter: 6\' 1"  (1.854 m).   Weight as of this encounter: 109.3 kg. Malnutrition Type:  Nutrition Problem: Inadequate oral intake Etiology: decreased appetite   Malnutrition Characteristics:  Signs/Symptoms: meal completion < 50%   Nutrition Interventions:  Interventions: Tube feeding, Prostat      DVT prophylaxis: Subcu heparin Code Status: Full code Family Communication:  Disposition Plan:    Consultants:    Procedures/Significant Events:  01/22/2020 admission 01/26/2020 left nephrostomy tube exchange 01/26/2020 left craniotomy for subdural hematoma with evacuation 01/29/2020 the left upper extremity midline 2/21 CT Head>Some further enlargement of the subdural hematoma on the left.Overall volume increase estimated at about 1/3. Maximal thicknessnow measured at 2.5 cm compared with 2 cm previously. Morehyperdense component. Summary distribution, including along theposterior falx and the tentorium. Increased mass effect withleft-to-right shift of 7 mm today, increased from 4 mm previously.  2/20 KUB>no IVC filter in place  2/25 head CT>>difficult improvement in mass-effect, postoperative changes post left subdural hematoma evacuation. Rightward shift 4 mm (improved). Small volume subdural hemorrhage noted along the tentorium. New small area left superior frontal gyrus hypoattenuation  3/1 CT head Slight decrease in size of residual mixed density extra-axial hemorrhage over the left convexity. Stable to slightly improved midline shift.  No new hemorrhage.  Stable left-sided white matter infarcts. Chronic sinus disease.    I have personally reviewed and interpreted all radiology studies and my findings are as above.  VENTILATOR SETTINGS:    Cultures 01/23/2020 Covid  negative 01/23/2020 urine culture was polymicrobial 01/23/2020 blood cultures was negative 01/23/2020 MRSA was positive  Antimicrobials: Anti-infectives (From admission, onward)   Start     Stop   01/25/20 1050  ceFAZolin (ANCEF) IVPB 2g/100 mL premix     01/26/20 1744   01/23/20 0300  ceFEPIme (MAXIPIME) 2 g in sodium chloride 0.9 % 100 mL IVPB  Status:  Discontinued     01/23/20 0257   01/23/20 0300  ceFEPIme (MAXIPIME) 2 g in sodium chloride 0.9 % 100 mL IVPB  Status:  Discontinued     01/25/20 1696       Devices    LINES / TUBES:      Continuous Infusions: . sodium chloride Stopped (02/02/20 0535)  . sodium chloride    . levETIRAcetam 500 mg (02/05/20 1009)     Objective: Vitals:   02/09/20 0400 02/09/20 0411 02/09/20 0738 02/09/20 1128  BP: 109/79  109/80 114/83  Pulse: (!) 107 (!) 108 (!) 107 (!) 103  Resp: 17 19 15 16   Temp: 99 F (37.2 C)  98.6 F (37 C) 98.6 F (37 C)  TempSrc: Oral  Oral Oral  SpO2: 99% 99% 99% 97%  Weight:      Height:        Intake/Output Summary (Last 24 hours) at 02/09/2020 1535 Last data filed at 02/09/2020 1300 Gross per 24 hour  Intake 320 ml  Output 1725 ml  Net -1405 ml   Filed Weights   01/23/20 1500 01/29/20 1200 02/03/20 0900  Weight: 110.3 kg 109.2 kg 109.3 kg   Physical Exam:  General: A/O x1 (does not know where, when, why) no acute respiratory distress Eyes: negative scleral hemorrhage, negative anisocoria, negative icterus ENT: Negative Runny nose, negative gingival bleeding, Neck:  Negative scars, masses, torticollis, lymphadenopathy, JVD Lungs: Clear to auscultation bilaterally without wheezes or crackles Cardiovascular: Regular rate and rhythm without murmur gallop or rub normal S1 and S2 Abdomen: negative abdominal pain, nondistended, positive soft, bowel sounds, no rebound, no ascites, no appreciable mass Extremities: No significant cyanosis, clubbing, or edema bilateral lower extremities Skin: Negative  rashes, lesions, ulcers Psychiatric: Unable to fully evaluate secondary to altered mental status Central nervous system:  Cranial nerves II through XII intact, tongue/uvula midline, all extremities on LEFT side muscle strength 5/5, hemiparesis right side, sensation intact throughout, negative dysarthria, negative expressive aphasia, negative receptive aphasia.      Data Reviewed: Care during the described time interval was provided by me .  I have reviewed this patient's available data, including medical history, events of note, physical examination, and all test results as part of my evaluation.  CBC: Recent Labs  Lab 02/04/20 0637 02/05/20 0540 02/06/20 0632 02/08/20 0507 02/09/20 0415  WBC 6.4 5.9 5.5 5.6 6.1  NEUTROABS  --   --   --  3.4 3.9  HGB 9.1* 8.9* 8.6* 9.6* 10.2*  HCT 31.1* 29.7* 28.9* 32.5* 32.4*  MCV 94.0 93.7 92.6 93.9 89.8  PLT 221 242 240 212 PLATELET CLUMPS NOTED ON SMEAR, UNABLE TO ESTIMATE   Basic Metabolic Panel: Recent Labs  Lab 02/04/20 0637 02/05/20 0540 02/06/20 0632 02/08/20 0507 02/09/20 0415  NA 134* 135 134* 136 135  K 5.8* 5.1 5.2* 4.2 4.7  CL 102 104 104 99 100  CO2 23 22 23 24  21*  GLUCOSE 325* 323* 244* 168* 180*  BUN 41* 45* 41* 52* 50*  CREATININE 1.99* 2.11* 2.15* 2.19* 2.28*  CALCIUM 9.1 8.8* 8.9 8.8* 8.8*  MG  --   --   --  2.0 2.0  PHOS  --   --   --  4.1 4.2   GFR: Estimated Creatinine Clearance: 44.7 mL/min (A) (by C-G formula based on SCr of 2.28 mg/dL (H)). Liver Function Tests: Recent Labs  Lab 02/08/20 0507 02/09/20 0415  AST 52* 53*  ALT 94* 69*  ALKPHOS 91 80  BILITOT 0.6 1.2  PROT 6.9 6.8  ALBUMIN 2.7* 2.6*   No results for input(s): LIPASE, AMYLASE in the last 168 hours. No results for input(s): AMMONIA in the last 168 hours. Coagulation Profile: No results for input(s): INR, PROTIME in the last 168 hours. Cardiac Enzymes: No results for input(s): CKTOTAL, CKMB, CKMBINDEX, TROPONINI in the last 168  hours. BNP (last 3 results) No results for input(s): PROBNP in the last 8760 hours. HbA1C: No results for input(s): HGBA1C in the last 72 hours. CBG: Recent Labs  Lab 02/08/20 2029 02/09/20 0028 02/09/20 0449 02/09/20 0737 02/09/20 1126  GLUCAP 173* 161* 188* 155* 158*   Lipid Profile: No results for input(s): CHOL, HDL, LDLCALC, TRIG, CHOLHDL, LDLDIRECT in the last 72 hours. Thyroid Function Tests: No results for input(s): TSH, T4TOTAL, FREET4, T3FREE, THYROIDAB in the last 72 hours. Anemia Panel: No results for input(s): VITAMINB12, FOLATE, FERRITIN, TIBC, IRON, RETICCTPCT in the last 72 hours. Sepsis Labs: No results for input(s): PROCALCITON, LATICACIDVEN in the last 168 hours.  No results found for this or any previous visit (from the past 240 hour(s)).       Radiology Studies: No results found.      Scheduled Meds: . chlorhexidine  15 mL Mouth Rinse BID  . Chlorhexidine Gluconate Cloth  6 each Topical Daily  . docusate  50 mg Oral Daily  .  famotidine  20 mg Oral QHS  . feeding supplement (NEPRO CARB STEADY)  1,000 mL Oral Q24H  . feeding supplement (NEPRO CARB STEADY)  237 mL Oral BID BM  . feeding supplement (PRO-STAT SUGAR FREE 64)  30 mL Oral BID  . heparin injection (subcutaneous)  5,000 Units Subcutaneous Q8H  . insulin aspart  0-9 Units Subcutaneous Q4H  . insulin detemir  28 Units Subcutaneous BID  . levETIRAcetam  500 mg Oral Q12H  . mouth rinse  15 mL Mouth Rinse BID  . mycophenolate  500 mg Oral BID  . sodium chloride flush  10-40 mL Intracatheter Q12H   Continuous Infusions: . sodium chloride Stopped (02/02/20 0535)  . sodium chloride    . levETIRAcetam 500 mg (02/05/20 1009)     LOS: 17 days    Time spent:40 min    Carly Applegate, Geraldo Docker, MD [facility] Pager [phone]  If 7PM-7AM, please contact night-coverage [email or web address] Password [facility] 02/09/2020, 3:35 PM

## 2020-02-10 DIAGNOSIS — R630 Anorexia: Secondary | ICD-10-CM | POA: Insufficient documentation

## 2020-02-10 LAB — CBC WITH DIFFERENTIAL/PLATELET
Abs Immature Granulocytes: 0.02 10*3/uL (ref 0.00–0.07)
Basophils Absolute: 0 10*3/uL (ref 0.0–0.1)
Basophils Relative: 1 %
Eosinophils Absolute: 0.8 10*3/uL — ABNORMAL HIGH (ref 0.0–0.5)
Eosinophils Relative: 14 %
HCT: 31.6 % — ABNORMAL LOW (ref 39.0–52.0)
Hemoglobin: 9.4 g/dL — ABNORMAL LOW (ref 13.0–17.0)
Immature Granulocytes: 0 %
Lymphocytes Relative: 16 %
Lymphs Abs: 0.9 10*3/uL (ref 0.7–4.0)
MCH: 27.6 pg (ref 26.0–34.0)
MCHC: 29.7 g/dL — ABNORMAL LOW (ref 30.0–36.0)
MCV: 92.9 fL (ref 80.0–100.0)
Monocytes Absolute: 0.5 10*3/uL (ref 0.1–1.0)
Monocytes Relative: 9 %
Neutro Abs: 3.2 10*3/uL (ref 1.7–7.7)
Neutrophils Relative %: 60 %
Platelets: 243 10*3/uL (ref 150–400)
RBC: 3.4 MIL/uL — ABNORMAL LOW (ref 4.22–5.81)
RDW: 16.2 % — ABNORMAL HIGH (ref 11.5–15.5)
WBC: 5.4 10*3/uL (ref 4.0–10.5)
nRBC: 0 % (ref 0.0–0.2)

## 2020-02-10 LAB — COMPREHENSIVE METABOLIC PANEL
ALT: 56 U/L — ABNORMAL HIGH (ref 0–44)
AST: 24 U/L (ref 15–41)
Albumin: 2.6 g/dL — ABNORMAL LOW (ref 3.5–5.0)
Alkaline Phosphatase: 76 U/L (ref 38–126)
Anion gap: 13 (ref 5–15)
BUN: 50 mg/dL — ABNORMAL HIGH (ref 6–20)
CO2: 23 mmol/L (ref 22–32)
Calcium: 8.9 mg/dL (ref 8.9–10.3)
Chloride: 99 mmol/L (ref 98–111)
Creatinine, Ser: 2.33 mg/dL — ABNORMAL HIGH (ref 0.61–1.24)
GFR calc Af Amer: 34 mL/min — ABNORMAL LOW (ref >60)
GFR calc non Af Amer: 29 mL/min — ABNORMAL LOW (ref >60)
Glucose, Bld: 151 mg/dL — ABNORMAL HIGH (ref 70–99)
Potassium: 4 mmol/L (ref 3.5–5.1)
Sodium: 135 mmol/L (ref 135–145)
Total Bilirubin: 0.6 mg/dL (ref 0.3–1.2)
Total Protein: 6.8 g/dL (ref 6.5–8.1)

## 2020-02-10 LAB — GLUCOSE, CAPILLARY
Glucose-Capillary: 151 mg/dL — ABNORMAL HIGH (ref 70–99)
Glucose-Capillary: 167 mg/dL — ABNORMAL HIGH (ref 70–99)
Glucose-Capillary: 164 mg/dL — ABNORMAL HIGH (ref 70–99)
Glucose-Capillary: 113 mg/dL — ABNORMAL HIGH (ref 70–99)
Glucose-Capillary: 147 mg/dL — ABNORMAL HIGH (ref 70–99)

## 2020-02-10 LAB — MAGNESIUM: Magnesium: 1.9 mg/dL (ref 1.7–2.4)

## 2020-02-10 LAB — PHOSPHORUS: Phosphorus: 3.8 mg/dL (ref 2.5–4.6)

## 2020-02-10 MED ORDER — DEXAMETHASONE 0.5 MG PO TABS
1.0000 mg | ORAL_TABLET | Freq: Two times a day (BID) | ORAL | Status: DC
  Administered 2020-02-11 – 2020-02-13 (×6): 1 mg via ORAL
  Filled 2020-02-10 (×6): qty 2

## 2020-02-10 MED ORDER — DEXAMETHASONE 0.5 MG PO TABS: 1.0000 mg | ORAL_TABLET | Freq: Two times a day (BID) | ORAL | Status: DC

## 2020-02-10 NOTE — Progress Notes (Signed)
  Speech Language Pathology Treatment: Dysphagia;Cognitive-Linquistic  Patient Details Name: Brendan [name] MRN: [id number] DOB: [birth year] Today's Date: 02/10/2020 Time: 4536-4680 SLP Time Calculation (min) (ACUTE ONLY): 19 min  Assessment / Plan / Recommendation Clinical Impression  Pt was seen for dysphagia and communication tx. He continues to decline all solids offered despite education on importance of good nutrition as well as significance for dysphagia tx/diet progression. He did drink his whole container of Nepro without overt s/s of aspiration despite fast rate. When given yes/no questions to facilitate communication, pt acknowledged that he does not want to eat because he does not like the food here. Adjusted his diet order to room service with assist so that maybe he can have some extra assistance in selecting from the menu. Given his communication difficulties, staff may need to support him as he makes selections.   Pt initially wasn't responding with more than a grunt to SLP questions, until topics of interest to him were identified (talking about his family, football). He responded primarily in one word answers with a few short phrases mixed in despite Max cues and opportunities to elicit verbal expression. When asked if it was hard to think of the words he wanted to say, he said yes. When asked if he wanted to do speech therapy to try to improve this, he did not respond. Will continue to follow for communication therapy, trying to elicit more verbal expression.    HPI HPI: This is a 59 year old nursing home resident with DM, L CVA 2009, and CAD who is chronically anticoagulated with Lovenox due to the presence of a right lower extremity DVT despite the fact that he has a IVC filter in place.  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  He came to medical attention at his [place] home because of aphasia and weakness.   A CT scan of the head has shown acute on chronic subdural hematoma, now s/p evacuation 2/22.      SLP Plan  Continue with current plan of care       Recommendations  Diet recommendations: Dysphagia 3 (mechanical soft);Thin liquid Liquids provided via: Cup;Straw Medication Administration: Whole meds with liquid Supervision: Staff to assist with self feeding Compensations: Slow rate;Small sips/bites Postural Changes and/or Swallow Maneuvers: Seated upright 90 degrees                Oral Care Recommendations: Oral care BID Follow up Recommendations: Skilled Nursing facility SLP Visit Diagnosis: Dysphagia, oral phase (R13.11);Aphasia (R47.01) Plan: Continue with current plan of care       GO                 Osie Bond., M.A. [facility] Pager [phone] Office [phone]  02/10/2020, 11:52 AM

## 2020-02-10 NOTE — Progress Notes (Signed)
Patient ID: Brendan [name], male   DOB: Dec 09, 1960, 59 y.o.   MRN: [id number]  BP 120/78   Pulse 95   Temp 98.4 F (36.9 C)   Resp 17   Ht 6\' 1"  (5.993 m)   Wt 112.7 kg   SpO2 98%   BMI 32.78 kg/m  Alert, following commands At his baseline Awaiting placement Wound is clean, dry, without signs of infection

## 2020-02-10 NOTE — Progress Notes (Addendum)
Daily Progress Note   [name]       Date: 02/10/2020 DOB: 1961-09-12  Age: 59 y.o. MRN#: [id number] Attending Physician: Allie Bossier, MD Primary Care Physician: Cleda Mccreedy, MD Admit Date: 01/22/2020  Reason for Consultation/Follow-up: Establishing goals of care  Subjective: Noted patient continues to decline food. Per speech therapy note he expressed disliking hospital food.  Video visit facilitated with patient's mom and sister again today- patient responded to his family with smiling and laughing.  After call he was able to tell me they live in [place].  Discussed possible need for PEG with sister- she states family would consent to PEG- they continue to desire all aggressive measures.   ROS  Length of Stay: 18  Current Medications: Scheduled Meds:  . chlorhexidine  15 mL Mouth Rinse BID  . Chlorhexidine Gluconate Cloth  6 each Topical Daily  . docusate  50 mg Oral Daily  . famotidine  20 mg Oral QHS  . feeding supplement (NEPRO CARB STEADY)  1,000 mL Oral Q24H  . feeding supplement (NEPRO CARB STEADY)  237 mL Oral BID BM  . feeding supplement (PRO-STAT SUGAR FREE 64)  30 mL Oral BID  . heparin injection (subcutaneous)  5,000 Units Subcutaneous Q8H  . insulin aspart  0-9 Units Subcutaneous Q4H  . insulin detemir  28 Units Subcutaneous BID  . levETIRAcetam  500 mg Oral Q12H  . mouth rinse  15 mL Mouth Rinse BID  . mycophenolate  500 mg Oral BID  . sodium chloride flush  10-40 mL Intracatheter Q12H    Continuous Infusions: . sodium chloride Stopped (02/02/20 0535)  . sodium chloride    . levETIRAcetam 500 mg (02/05/20 1009)    PRN Meds: Place/Maintain arterial line **AND** sodium chloride, acetaminophen (TYLENOL) oral liquid 160 mg/5 mL,  hydrALAZINE, labetalol, lidocaine (PF), oxyCODONE, polyethylene glycol, sodium chloride flush  Physical Exam Vitals and nursing note reviewed.  Constitutional:      Appearance: Normal appearance.  Cardiovascular:     Rate and Rhythm: Normal rate.  Pulmonary:     Effort: Pulmonary effort is normal.  Neurological:     Mental Status: He is alert.  Psychiatric:     Comments: More interactive today             Vital Signs:  BP 119/89 (BP Location: Right Arm)   Pulse (!) 104   Temp 98.2 F (36.8 C) (Oral)   Resp 17   Ht 6\' 1"  (1.854 m)   Wt 112.7 kg   SpO2 98%   BMI 32.78 kg/m  SpO2: SpO2: 98 % O2 Device: O2 Device: Room Air O2 Flow Rate:    Intake/output summary:   Intake/Output Summary (Last 24 hours) at 02/10/2020 1618 Last data filed at 02/10/2020 1555 Gross per 24 hour  Intake 990 ml  Output 2125 ml  Net -1135 ml   LBM: Last BM Date: 02/08/20 Baseline Weight: Weight: 112 kg Most recent weight: Weight: 112.7 kg       Palliative Assessment/Data: PPS: 20%    Flowsheet Rows     Most Recent Value  Intake Tab  Referral Department  Hospitalist  Unit at Time of Referral  ICU  Palliative Care Primary Diagnosis  Neurology  Date Notified  02/02/20  Palliative Care Type  New Palliative care  Reason for referral  Clarify Goals of Care  Date of Admission  01/22/20  Date first seen by Palliative Care  02/03/20  # of days Palliative referral response time  1 Day(s)  # of days IP prior to Palliative referral  11  Clinical Assessment  Psychosocial & Spiritual Assessment  Palliative Care Outcomes      Patient Active Problem List   Diagnosis Date Noted  . Advanced care planning/counseling discussion   . Goals of care, counseling/discussion   . Palliative care by specialist   . Subdural hematoma, acute ([place]) 01/26/2020  . SDH (subdural hematoma) ([place]) 01/23/2020  . DVT (deep venous thrombosis) ([place]) 12/19/2019  . Right leg DVT ([place]) 12/18/2019  . Symptomatic anemia  03/22/2018  . History of DVT in adulthood 03/22/2018  . Gross hematuria   . Nephrostomy tube displaced ([place]) 07/19/2017  . Pyelonephritis 02/16/2017  . 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 Coronary atherosclerosis of native coronary artery   . Essential hypertension   . Stroke ([place])   . Diabetes mellitus without complication ([place])   . Chronic kidney disease, stage IV (severe) ([place])   . GERD (gastroesophageal reflux disease)   . Glaucoma   . Hemiparesis affecting right side as late effect of cerebrovascular accident ([place])   . Enlarged prostate with urinary obstruction   . Constipation     Palliative Care Assessment & Plan   Patient Profile: 59 y.o.malewith past medical history of retroperitoneal fibrosis with chronic percutaneous nephrostomy, DVT on Lovenox, previous CVA, CKD IIIwho was admitted on2/18/2021with acute on chronic subdural hematoma and underwent evacuation on 2/21.  Assessment/Recommendations/Plan   Poor po intake- patient appears to have functional capacity for intake- however, has no appetite  Family agrees to PEG  Patient and family expressed patient enjoys fried chicken- will bring him some tomorrow in effort to increase po intake  Will trial 7 day course of low dose dexamethasone to stimulate appetite- 1mg  po with breakfast and lunch x 7 days  Goals of Care and Additional Recommendations:  Limitations on Scope of Treatment: Full Scope Treatment  Code Status:  Full code  Prognosis:   Unable to determine  Discharge Planning:  [place] for rehab with Palliative care service follow-up  Care plan was discussed with patient's mother and sister.   Thank you for allowing the Palliative Medicine Team to assist in the care of this patient.   Time In: 1530 Time Out: 1630 Total Time 60 minutes Prolonged Time Billed yes  Greater than 50%  of this time was spent counseling and coordinating care related to the above assessment  and plan.  Mariana Kaufman, AGNP-C Palliative Medicine   Please contact Palliative Medicine Team phone at [phone] for questions and concerns.

## 2020-02-11 DIAGNOSIS — Z794 Long term (current) use of insulin: Secondary | ICD-10-CM

## 2020-02-11 DIAGNOSIS — I824Y1 Acute embolism and thrombosis of unspecified deep veins of right proximal lower extremity: Secondary | ICD-10-CM

## 2020-02-11 DIAGNOSIS — E1122 Type 2 diabetes mellitus with diabetic chronic kidney disease: Secondary | ICD-10-CM

## 2020-02-11 LAB — GLUCOSE, CAPILLARY
Glucose-Capillary: 130 mg/dL — ABNORMAL HIGH (ref 70–99)
Glucose-Capillary: 150 mg/dL — ABNORMAL HIGH (ref 70–99)
Glucose-Capillary: 161 mg/dL — ABNORMAL HIGH (ref 70–99)
Glucose-Capillary: 166 mg/dL — ABNORMAL HIGH (ref 70–99)
Glucose-Capillary: 173 mg/dL — ABNORMAL HIGH (ref 70–99)
Glucose-Capillary: 194 mg/dL — ABNORMAL HIGH (ref 70–99)
Glucose-Capillary: 212 mg/dL — ABNORMAL HIGH (ref 70–99)

## 2020-02-11 LAB — CBC WITH DIFFERENTIAL/PLATELET
Abs Immature Granulocytes: 0.03 10*3/uL (ref 0.00–0.07)
Basophils Absolute: 0 10*3/uL (ref 0.0–0.1)
Basophils Relative: 0 %
Eosinophils Absolute: 0.8 10*3/uL — ABNORMAL HIGH (ref 0.0–0.5)
Eosinophils Relative: 14 %
HCT: 30.7 % — ABNORMAL LOW (ref 39.0–52.0)
Hemoglobin: 9.3 g/dL — ABNORMAL LOW (ref 13.0–17.0)
Immature Granulocytes: 1 %
Lymphocytes Relative: 15 %
Lymphs Abs: 0.9 10*3/uL (ref 0.7–4.0)
MCH: 27.7 pg (ref 26.0–34.0)
MCHC: 30.3 g/dL (ref 30.0–36.0)
MCV: 91.4 fL (ref 80.0–100.0)
Monocytes Absolute: 0.6 10*3/uL (ref 0.1–1.0)
Monocytes Relative: 10 %
Neutro Abs: 3.7 10*3/uL (ref 1.7–7.7)
Neutrophils Relative %: 60 %
Platelets: 256 10*3/uL (ref 150–400)
RBC: 3.36 MIL/uL — ABNORMAL LOW (ref 4.22–5.81)
RDW: 16 % — ABNORMAL HIGH (ref 11.5–15.5)
WBC: 6.1 10*3/uL (ref 4.0–10.5)
nRBC: 0 % (ref 0.0–0.2)

## 2020-02-11 LAB — COMPREHENSIVE METABOLIC PANEL
ALT: 46 U/L — ABNORMAL HIGH (ref 0–44)
AST: 23 U/L (ref 15–41)
Albumin: 2.6 g/dL — ABNORMAL LOW (ref 3.5–5.0)
Alkaline Phosphatase: 83 U/L (ref 38–126)
Anion gap: 11 (ref 5–15)
BUN: 52 mg/dL — ABNORMAL HIGH (ref 6–20)
CO2: 24 mmol/L (ref 22–32)
Calcium: 8.9 mg/dL (ref 8.9–10.3)
Chloride: 100 mmol/L (ref 98–111)
Creatinine, Ser: 2.29 mg/dL — ABNORMAL HIGH (ref 0.61–1.24)
GFR calc Af Amer: 35 mL/min — ABNORMAL LOW (ref >60)
GFR calc non Af Amer: 30 mL/min — ABNORMAL LOW (ref >60)
Glucose, Bld: 165 mg/dL — ABNORMAL HIGH (ref 70–99)
Potassium: 4.4 mmol/L (ref 3.5–5.1)
Sodium: 135 mmol/L (ref 135–145)
Total Bilirubin: 0.5 mg/dL (ref 0.3–1.2)
Total Protein: 6.8 g/dL (ref 6.5–8.1)

## 2020-02-11 LAB — PHOSPHORUS: Phosphorus: 3.6 mg/dL (ref 2.5–4.6)

## 2020-02-11 LAB — MAGNESIUM: Magnesium: 2 mg/dL (ref 1.7–2.4)

## 2020-02-11 NOTE — Progress Notes (Addendum)
PROGRESS NOTE    Fields Oros  HER:[id number] DOB: 1961-06-11 DOA: 01/22/2020 PCP: Cleda Mccreedy, MD    Brief Narrative:  Patient was admitted to the hospital with a working diagnosis of large left subdural hematoma.   59 year old male who presented with worsening left-sided weakness and aphasia. He does have significant past medical history for chronic subdural hematoma, obstructive uropathy status post chronic nephrostomy tube.  His initial physical examination blood pressure 114/72, heart rate 110, temperature 99, respiratory rate 17, he open his eyes to voice and track for short time, heart S1-S2 present, bradycardic, positive S4 gallop, lungs clear to auscultation bilaterally, soft abdomen, positive lower extremity edema, positive gross hematuria from left nephrostomy tube.   Head CT with a large mixed density left-sided subdural hematoma up to 19 mm in thickness, superimposed chronic left MCA territory encephalomalacia with only mild rightward midline shift of 4 mm.  Patient received protamine to reverse enoxaparin, underwent craniotomy 01/25/20.  Patient has been placed on Keppra for seizure precautions, speech therapy has recommended dysphagia 3 diet.  Patient continued to have persistent encephalopathy.  Patient has acute right deep vein thrombosis, unable to place IVC filter by vascular surgery or interventional radiology.  Patient has resumed only prophylactic doses of enoxaparin due to risk of bleeding.  Slowly worsening overall condition, very poor oral intake, currently has an NG tube for feedings.  Palliative care team has been consulted.  Assessment & Plan:   Principal Problem:   Subdural hematoma, acute (HCC) Active Problems:   Essential hypertension   Diabetes mellitus without complication (HCC)   Chronic kidney disease, stage IV (severe) (HCC)   Retroperitoneal fibrosis   Right leg DVT (HCC)   SDH (subdural hematoma) ([place])   1. Acute on chronic subdural hematoma,  complicated with encephalopathy and swallow dysfunction. This am patient is awake and alert, responding simple questions. Continue to have significant generalized deconditioning and paresis on the right upper extremity. Patient is tolerating dysphagia 3 diet.  Will continue aspiration precautions, neuro checks per unit protocol, PT, OT and out of bed to chair as tolerated, tid with meals. Will discuss with his family pros and cons of tube feedings. When I asked the patient he prefers to continue by mouth intake. Certainly even with PEG tube patient can aspirate.   Continue keppra.   2. Right DVT. No pain or significant edema at the lower extremities. Continue with prophylactic doses of enoxaparin for now in the setting of recent subdural hematoma.   3. CKD stage IV/ urinary retention/ hyperkalemia/ retroperitoneal fibrosis. His renal function has been stable with serum cr at 2,29 with K at 4,4 and serum bicarbonate at 24. Will continue close monitoring, avoid nephrotoxic medications or hypotension. Continue with mycophenolate.   4. HTN. Conttinue to hold on antihypertensive medications for now.  5. Calorie protein malnutrition. Continue with nutritional support. On steroids for appetite stimulant.   6. T2DM. Will continue glucose control with basal insulin 28 units bid, detemir and insulin sliding scale.    DVT prophylaxis: enoxaparin   Code Status:  full Family Communication: no family at the bedside Disposition Plan/ discharge barriers: patient from home, current barrier for discharge is significant deconditioning, requiring high nursing care. Pending placement at SNF   Nutrition Status: Nutrition Problem: Inadequate oral intake Etiology: decreased appetite Signs/Symptoms: meal completion < 50% Interventions: Tube feeding, Prostat     Skin Documentation:     Consultants:   Palliative  Vascular surgery   IR  s  Subjective: Patient is awake and alert, following simple  commands with feet and left hand, no pain, dyspnea, no nausea or vomiting. Able to respond simple questions.   Objective: Vitals:   02/11/20 0014 02/11/20 0529 02/11/20 0537 02/11/20 0727  BP: 115/80 135/88  128/80  Pulse:   (!) 106 (!) 105  Resp:   19 20  Temp: 98.3 F (36.8 C) 98.4 F (36.9 C)  98.4 F (36.9 C)  TempSrc: Oral Oral  Oral  SpO2: 97%  97% 97%  Weight:      Height:        Intake/Output Summary (Last 24 hours) at 02/11/2020 0947 Last data filed at 02/11/2020 0836 Gross per 24 hour  Intake 650 ml  Output 1950 ml  Net -1300 ml   Filed Weights   01/29/20 1200 02/03/20 0900 02/10/20 0228  Weight: 109.2 kg 109.3 kg 112.7 kg    Examination:   General: Not in pain or dyspnea, deconditioned  Neurology: Awake and alert, decrease strength proximally all four extremities with paresis at the right upper extremity.  E ENT: mild pallor, no icterus, oral mucosa moist. NG tube in place.  Cardiovascular: No JVD. S1-S2 present, rhythmic, no gallops, rubs, or murmurs. No lower extremity edema. Pulmonary: positive breath sounds bilaterally, adequate air movement, no wheezing, rhonchi or rales. Gastrointestinal. Abdomen with no organomegaly, non tender, no rebound or guarding Skin. No rashes Musculoskeletal: no joint deformities     Data Reviewed: I have personally reviewed following labs and imaging studies  CBC: Recent Labs  Lab 02/06/20 0632 02/08/20 0507 02/09/20 0415 02/10/20 0500 02/11/20 0536  WBC 5.5 5.6 6.1 5.4 6.1  NEUTROABS  --  3.4 3.9 3.2 3.7  HGB 8.6* 9.6* 10.2* 9.4* 9.3*  HCT 28.9* 32.5* 32.4* 31.6* 30.7*  MCV 92.6 93.9 89.8 92.9 91.4  PLT 240 212 PLATELET CLUMPS NOTED ON SMEAR, UNABLE TO ESTIMATE 243 350   Basic Metabolic Panel: Recent Labs  Lab 02/06/20 0632 02/08/20 0507 02/09/20 0415 02/10/20 0500 02/11/20 0536  NA 134* 136 135 135 135  K 5.2* 4.2 4.7 4.0 4.4  CL 104 99 100 99 100  CO2 23 24 21* 23 24  GLUCOSE 244* 168* 180* 151* 165*   BUN 41* 52* 50* 50* 52*  CREATININE 2.15* 2.19* 2.28* 2.33* 2.29*  CALCIUM 8.9 8.8* 8.8* 8.9 8.9  MG  --  2.0 2.0 1.9 2.0  PHOS  --  4.1 4.2 3.8 3.6   GFR: Estimated Creatinine Clearance: 45.1 mL/min (A) (by C-G formula based on SCr of 2.29 mg/dL (H)). Liver Function Tests: Recent Labs  Lab 02/08/20 0507 02/09/20 0415 02/10/20 0500 02/11/20 0536  AST 52* 53* 24 23  ALT 94* 69* 56* 46*  ALKPHOS 91 80 76 83  BILITOT 0.6 1.2 0.6 0.5  PROT 6.9 6.8 6.8 6.8  ALBUMIN 2.7* 2.6* 2.6* 2.6*   No results for input(s): LIPASE, AMYLASE in the last 168 hours. No results for input(s): AMMONIA in the last 168 hours. Coagulation Profile: No results for input(s): INR, PROTIME in the last 168 hours. Cardiac Enzymes: No results for input(s): CKTOTAL, CKMB, CKMBINDEX, TROPONINI in the last 168 hours. BNP (last 3 results) No results for input(s): PROBNP in the last 8760 hours. HbA1C: No results for input(s): HGBA1C in the last 72 hours. CBG: Recent Labs  Lab 02/10/20 1554 02/10/20 2026 02/11/20 0017 02/11/20 0530 02/11/20 0724  GLUCAP 113* 147* 161* 150* 166*   Lipid Profile: No results for input(s): CHOL, HDL, LDLCALC,  TRIG, CHOLHDL, LDLDIRECT in the last 72 hours. Thyroid Function Tests: No results for input(s): TSH, T4TOTAL, FREET4, T3FREE, THYROIDAB in the last 72 hours. Anemia Panel: No results for input(s): VITAMINB12, FOLATE, FERRITIN, TIBC, IRON, RETICCTPCT in the last 72 hours.    Radiology Studies: I have reviewed all of the imaging during this hospital visit personally     Scheduled Meds: . chlorhexidine  15 mL Mouth Rinse BID  . Chlorhexidine Gluconate Cloth  6 each Topical Daily  . dexamethasone  1 mg Oral BID WC  . docusate  50 mg Oral Daily  . famotidine  20 mg Oral QHS  . feeding supplement (NEPRO CARB STEADY)  1,000 mL Oral Q24H  . feeding supplement (NEPRO CARB STEADY)  237 mL Oral BID BM  . feeding supplement (PRO-STAT SUGAR FREE 64)  30 mL Oral BID  .  heparin injection (subcutaneous)  5,000 Units Subcutaneous Q8H  . insulin aspart  0-9 Units Subcutaneous Q4H  . insulin detemir  28 Units Subcutaneous BID  . levETIRAcetam  500 mg Oral Q12H  . mouth rinse  15 mL Mouth Rinse BID  . mycophenolate  500 mg Oral BID  . sodium chloride flush  10-40 mL Intracatheter Q12H   Continuous Infusions: . sodium chloride Stopped (02/02/20 0535)  . sodium chloride    . levETIRAcetam 500 mg (02/05/20 1009)     LOS: 19 days        Markeesha Char Gerome Apley, MD

## 2020-02-11 NOTE — Discharge Instructions (Signed)
Craniotomy °Care After °Please read the instructions outlined below and refer to this sheet in the next few weeks. These discharge instructions provide you with general information on caring for yourself after you leave the hospital. Your surgeon may also give you specific instructions. While your treatment has been planned according to the most current medical practices available, unavoidable complications occasionally occur. If you have any problems or questions after discharge, please call your surgeon. °Although there are many types of brain surgery, recovery following craniotomy (surgical opening of the skull) is much the same for each. However, recovery depends on many factors. These include the type and severity of brain injury and the type of surgery. It also depends on any nervous system function problems (neurological deficits) before surgery. If the craniotomy was done for cancer, chemotherapy and radiation could follow. You could be in the hospital from 5 days to a couple weeks. This depends on the type of surgery, findings, and whether there are complications. °HOME CARE INSTRUCTIONS  °· It is not unusual to hear a clicking noise after a craniotomy, the plates and screws used to attach the bone flap can sometimes cause this. It is a normal occurrence if this does happen °· Do not drive for 10 days after the operation °· Your scalp may feel spongy for a while, because of fluid under it. This will gradually get better. Occasionally, the surgeon will not replace the bone that was removed to access the brain. If there is a bony defect, the surgeon will ask you to wear a helmet for protection. This is a discussion you should have with your surgeon prior to leaving the hospital (discharge). °· Numbness may persist in some areas of your scalp. °· Take all medications as directed. Sometimes steroids to control swelling are prescribed. Anticonvulsants to prevent seizures may also be given. Do not use alcohol,  other drugs, or medications unless your surgeon says it is OK. °· Keep the wound dry and clean. The wound may be washed gently with soap and water. Then, you may gently blot or dab it dry, without rubbing. Do not take baths, use swimming pools or hot tubs for 10 days, or as instructed by your caregiver. It is best to wait to see you surgeon at your first postoperative visit, and to get directions at that time. °· Only take over-the-counter or prescription medicines for pain, discomfort, or fever as directed by your caregiver. °· You may continue your normal diet, as directed. °· Walking is OK for exercise. Wait at least 3 months before you return to mild, non-contact sports or as your surgeon suggests. Contact sports should be avoided for at least 1 year, unless your surgeon says it is OK. °· If you are prescribed steroids, take them exactly as prescribed. If you start having a decrease in nervous system functions (neurological deficits) and headaches as the dose of steroids is reduced, tell your surgeon right away. °· When the anticonvulsant prescription is finished you no longer need to take it. °SEEK IMMEDIATE MEDICAL CARE IF:  °· You develop nausea, vomiting, severe headaches, confusion, or you have a seizure. °· You develop chest pain, a stiff neck, or difficulty breathing. °· There is redness, swelling, or increasing pain in the wound or pin insertion sites. °· You have an increase in swelling or bruising around the eyes. °· There is drainage or pus coming from the wound. °· You have an oral temperature above 102° F (38.9° C), not controlled by medicine. °·   You notice a foul smell coming from the wound or dressing. °· The wound breaks open (edges not staying together) after the stitches have been removed. °· You develop dizziness or fainting while standing. °· You develop a rash. °· You develop any reaction or side effects to the medications given. °Document Released: 02/20/2006 Document Revised: 02/12/2012  Document Reviewed: 11/29/2009 °ExitCare® Patient Information ©2013 [employer]. ° °

## 2020-02-11 NOTE — Progress Notes (Signed)
Physical Therapy Treatment Patient Details Name: Brendan [name] MRN: [id number] DOB: 03/17/1961 Today's Date: 02/11/2020    History of Present Illness 59 year old nursing home resident with DM, L CVA 2009, and CAD who is chronically anticoagulated with Lovenox due to the presence of a right lower extremity DVT despite the fact that he has a IVC filter in place.  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  He came to medical attention at his [place] home because of aphasia and weakness.  A CT scan of the head has shown acute on chronic subdural hematoma., pt s/p evacuation of L SDH 01/26/20    PT Comments    With encouragement, pt participated, but he refuses to sit up OOB.  Emphasis on transitions, sitting balance at EOB and sit to stand with work on upright stance.  Pt stood up to 25 secs 1st trial and less each success trial.    Follow Up Recommendations  SNF;Supervision/Assistance - 24 hour     Equipment Recommendations  None recommended by PT    Recommendations for Other Services       Precautions / Restrictions Precautions Precautions: Fall    Mobility  Bed Mobility Overal bed mobility: Needs Assistance Bed Mobility: Rolling;Sidelying to Sit;Sit to Supine Rolling: Mod assist Sidelying to sit: Mod assist   Sit to supine: Mod assist;+2 for physical assistance   General bed mobility comments: pt participated in getting repositioned up toward [facility].  Transfers Overall transfer level: Needs assistance Equipment used: (chair back) Transfers: Sit to/from Stand Sit to Stand: +2 safety/equipment;Mod assist;From elevated surface         General transfer comment: cues for hand placement and assist to come forward and boost.(x3)  Ambulation/Gait             General Gait Details: not tested   Stairs             Wheelchair Mobility    Modified Rankin (Stroke Patients Only) Modified  Rankin (Stroke Patients Only) Modified Rankin: Severe disability     Balance Overall balance assessment: Needs assistance   Sitting balance-Leahy Scale: Fair     Standing balance support: During functional activity;Single extremity supported Standing balance-Leahy Scale: Poor Standing balance comment: 3 trials of standing at EOB, guarding/blocking R LE.  Pt able to attain upright posture all 3 trials with moderate support.                            Cognition Arousal/Alertness: Awake/alert Behavior During Therapy: Flat affect Overall Cognitive Status: Impaired/Different from baseline(NT formally, unable to determine)                         Following Commands: Follows one step commands with increased time     Problem Solving: Slow processing;Requires verbal cues;Requires tactile cues General Comments: still appears depressed.      Exercises General Exercises - Lower Extremity Heel Slides: AROM;AAROM;Both;10 reps(graded resistance in gross extension)    General Comments        Pertinent Vitals/Pain Pain Assessment: Faces Faces Pain Scale: No hurt    Home Living                      Prior Function            PT Goals (current goals can now be found in the  care plan section) Acute Rehab PT Goals Patient Stated Goal: unable to state PT Goal Formulation: Patient unable to participate in goal setting Time For Goal Achievement: 02/13/20 Potential to Achieve Goals: Fair Progress towards PT goals: (progressing when he participates)    Frequency    Min 3X/week      PT Plan Current plan remains appropriate    Co-evaluation              AM-PAC PT "6 Clicks" Mobility   Outcome Measure  Help needed turning from your back to your side while in a flat bed without using bedrails?: A Lot Help needed moving from lying on your back to sitting on the side of a flat bed without using bedrails?: A Lot Help needed moving to and from a  bed to a chair (including a wheelchair)?: Total Help needed standing up from a chair using your arms (e.g., wheelchair or bedside chair)?: A Lot Help needed to walk in hospital room?: Total Help needed climbing 3-5 steps with a railing? : Total 6 Click Score: 9    End of Session   Activity Tolerance: Patient tolerated treatment well Patient left: in bed;with call bell/phone within reach;with bed alarm set Nurse Communication: Mobility status PT Visit Diagnosis: Other abnormalities of gait and mobility (R26.89);Muscle weakness (generalized) (M62.81);Hemiplegia and hemiparesis Hemiplegia - Right/Left: Right Hemiplegia - dominant/non-dominant: Dominant Hemiplegia - caused by: Nontraumatic intracerebral hemorrhage     Time: 1207-1230 PT Time Calculation (min) (ACUTE ONLY): 23 min  Charges:  $Therapeutic Activity: 8-22 mins $Neuromuscular Re-education: 8-22 mins                     02/11/2020  Ginger Carne., PT Acute Rehabilitation Services [phone]  (pager) [phone]  (office)   Brendan [name] 02/11/2020, 4:24 PM

## 2020-02-11 NOTE — Progress Notes (Signed)
Patient ID: Brendan [name], male   DOB: 1961-09-09, 59 y.o.   MRN: [id number] BP 120/82 (BP Location: Right Arm)   Pulse (!) 101   Temp 98 F (36.7 C) (Oral)   Resp 12   Ht 6\' 1"  (1.854 m)   Wt 112.7 kg   SpO2 97%   BMI 32.78 kg/m  Alert and oriented x 3 Moving extremities, at baseline(weakness on the right) Wound is clean. Will sign off and leave directions on discharge.

## 2020-02-11 NOTE — Progress Notes (Signed)
Daily Progress Note   [name]       Date: 02/11/2020 DOB: 05-21-1961  Age: 59 y.o. MRN#: [id number] Attending Physician: Tawni Millers Primary Care Physician: Cleda Mccreedy, MD Admit Date: 01/22/2020  Reason for Consultation/Follow-up: Establishing goals of care  Subjective: Patient in bed- has just completed PT. Still not eating- not interested in fried chicken that I brought him- but wanted to keep it and try it later.  Attempted GOC discussion with him to gauge his ability for decision making- asked him if he would want a permanent feeding tube- he replied no- when I asked him if he would want it if it was the only way to keep him alive- he closed his eyes and did not offer response.  Attempted to discuss code status- has same response with him staring down and closing his eyes.  He did willingly talk about football- told me his favorite team was the [employer].  Review of Systems  Unable to perform ROS: Acuity of condition    Length of Stay: 19  Current Medications: Scheduled Meds:  . chlorhexidine  15 mL Mouth Rinse BID  . Chlorhexidine Gluconate Cloth  6 each Topical Daily  . dexamethasone  1 mg Oral BID WC  . docusate  50 mg Oral Daily  . famotidine  20 mg Oral QHS  . feeding supplement (NEPRO CARB STEADY)  1,000 mL Oral Q24H  . feeding supplement (NEPRO CARB STEADY)  237 mL Oral BID BM  . feeding supplement (PRO-STAT SUGAR FREE 64)  30 mL Oral BID  . heparin injection (subcutaneous)  5,000 Units Subcutaneous Q8H  . insulin aspart  0-9 Units Subcutaneous Q4H  . insulin detemir  28 Units Subcutaneous BID  . levETIRAcetam  500 mg Oral Q12H  . mouth rinse  15 mL Mouth Rinse BID  . mycophenolate  500 mg Oral BID  . sodium chloride flush  10-40 mL  Intracatheter Q12H    Continuous Infusions: . sodium chloride Stopped (02/02/20 0535)  . sodium chloride      PRN Meds: Place/Maintain arterial line **AND** sodium chloride, acetaminophen (TYLENOL) oral liquid 160 mg/5 mL, hydrALAZINE, labetalol, lidocaine (PF), oxyCODONE, polyethylene glycol, sodium chloride flush  Physical Exam          Vital Signs: BP 120/82 (BP Location: Right Arm)  Pulse (!) 101   Temp 98 F (36.7 C) (Oral)   Resp 12   Ht 6\' 1"  (1.854 m)   Wt 112.7 kg   SpO2 97%   BMI 32.78 kg/m  SpO2: SpO2: 97 % O2 Device: O2 Device: Room Air O2 Flow Rate:    Intake/output summary:   Intake/Output Summary (Last 24 hours) at 02/11/2020 1319 Last data filed at 02/11/2020 1100 Gross per 24 hour  Intake 650 ml  Output 2150 ml  Net -1500 ml   LBM: Last BM Date: 02/08/20 Baseline Weight: Weight: 112 kg Most recent weight: Weight: 112.7 kg       Palliative Assessment/Data:    Flowsheet Rows     Most Recent Value  Intake Tab  Referral Department  Hospitalist  Unit at Time of Referral  ICU  Palliative Care Primary Diagnosis  Neurology  Date Notified  02/02/20  Palliative Care Type  New Palliative care  Reason for referral  Clarify Goals of Care  Date of Admission  01/22/20  Date first seen by Palliative Care  02/03/20  # of days Palliative referral response time  1 Day(s)  # of days IP prior to Palliative referral  11  Clinical Assessment  Psychosocial & Spiritual Assessment  Palliative Care Outcomes      Patient Active Problem List   Diagnosis Date Noted  . Anorexia symptom   . Advanced care planning/counseling discussion   . Goals of care, counseling/discussion   . Palliative care by specialist   . Subdural hematoma, acute ([place]) 01/26/2020  . SDH (subdural hematoma) ([place]) 01/23/2020  . DVT (deep venous thrombosis) ([place]) 12/19/2019  . Right leg DVT ([place]) 12/18/2019  . Symptomatic anemia 03/22/2018  . History of DVT in adulthood 03/22/2018  .  Gross hematuria   . Nephrostomy tube displaced ([place]) 07/19/2017  . Pyelonephritis 02/16/2017  . 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 Coronary atherosclerosis of native coronary artery   . Essential hypertension   . Stroke ([place])   . Diabetes mellitus without complication ([place])   . Chronic kidney disease, stage IV (severe) ([place])   . GERD (gastroesophageal reflux disease)   . Glaucoma   . Hemiparesis affecting right side as late effect of cerebrovascular accident ([place])   . Enlarged prostate with urinary obstruction   . Constipation     Palliative Care Assessment & Plan   Patient Profile: 59 y.o.malewith past medical history of retroperitoneal fibrosis with chronic percutaneous nephrostomy, DVT on Lovenox, previous CVA, CKD IIIwho was admitted on2/18/2021with acute on chronic subdural hematoma and underwent evacuation on 2/21.  Assessment/Recommendations/Plan   Poor po intake- continue trial of dexamethasone- family would want PEG  Goals of Care and Additional Recommendations:  Limitations on Scope of Treatment: Full Scope Treatment  Code Status:  Full code  Prognosis:   Unable to determine  Discharge Planning:  [place] for rehab with Palliative care service follow-up  Thank you for allowing the Palliative Medicine Team to assist in the care of this patient.   Time In: 1230 Time Out: 1305 Total Time 35 mins Prolonged Time Billed no      Greater than 50%  of this time was spent counseling and coordinating care related to the above assessment and plan.  Mariana Kaufman, AGNP-C Palliative Medicine   Please contact Palliative Medicine Team phone at [phone] for questions and concerns.

## 2020-02-12 DIAGNOSIS — E119 Type 2 diabetes mellitus without complications: Secondary | ICD-10-CM

## 2020-02-12 DIAGNOSIS — I10 Essential (primary) hypertension: Secondary | ICD-10-CM

## 2020-02-12 LAB — GLUCOSE, CAPILLARY
Glucose-Capillary: 173 mg/dL — ABNORMAL HIGH (ref 70–99)
Glucose-Capillary: 195 mg/dL — ABNORMAL HIGH (ref 70–99)
Glucose-Capillary: 197 mg/dL — ABNORMAL HIGH (ref 70–99)
Glucose-Capillary: 243 mg/dL — ABNORMAL HIGH (ref 70–99)
Glucose-Capillary: 241 mg/dL — ABNORMAL HIGH (ref 70–99)
Glucose-Capillary: 174 mg/dL — ABNORMAL HIGH (ref 70–99)

## 2020-02-12 LAB — COMPREHENSIVE METABOLIC PANEL
ALT: 40 U/L (ref 0–44)
AST: 21 U/L (ref 15–41)
Albumin: 2.7 g/dL — ABNORMAL LOW (ref 3.5–5.0)
Alkaline Phosphatase: 80 U/L (ref 38–126)
Anion gap: 12 (ref 5–15)
BUN: 49 mg/dL — ABNORMAL HIGH (ref 6–20)
CO2: 22 mmol/L (ref 22–32)
Calcium: 9.2 mg/dL (ref 8.9–10.3)
Chloride: 102 mmol/L (ref 98–111)
Creatinine, Ser: 2.16 mg/dL — ABNORMAL HIGH (ref 0.61–1.24)
GFR calc Af Amer: 37 mL/min — ABNORMAL LOW (ref >60)
GFR calc non Af Amer: 32 mL/min — ABNORMAL LOW (ref >60)
Glucose, Bld: 184 mg/dL — ABNORMAL HIGH (ref 70–99)
Potassium: 4.8 mmol/L (ref 3.5–5.1)
Sodium: 136 mmol/L (ref 135–145)
Total Bilirubin: 0.6 mg/dL (ref 0.3–1.2)
Total Protein: 6.9 g/dL (ref 6.5–8.1)

## 2020-02-12 LAB — SARS CORONAVIRUS 2 (TAT 6-24 HRS): SARS Coronavirus 2: NEGATIVE

## 2020-02-12 MED ORDER — NEPRO/CARBSTEADY PO LIQD
237.0000 mL | Freq: Four times a day (QID) | ORAL | Status: DC
  Administered 2020-02-12 – 2020-02-13 (×3): 237 mL via ORAL

## 2020-02-12 MED ORDER — INSULIN DETEMIR 100 UNIT/ML ~~LOC~~ SOLN
28.0000 [IU] | Freq: Every day | SUBCUTANEOUS | Status: DC
  Administered 2020-02-13: 28 [IU] via SUBCUTANEOUS
  Filled 2020-02-12: qty 0.28

## 2020-02-12 MED ORDER — PHENYLEPHRINE HCL-NACL 10-0.9 MG/250ML-% IV SOLN
INTRAVENOUS | Status: AC
  Filled 2020-02-12: qty 250

## 2020-02-12 MED ORDER — PRO-STAT SUGAR FREE PO LIQD
30.0000 mL | Freq: Three times a day (TID) | ORAL | Status: DC
  Administered 2020-02-13: 30 mL via ORAL
  Filled 2020-02-12: qty 30

## 2020-02-12 NOTE — Progress Notes (Addendum)
PROGRESS NOTE    Brendan [name]  LGX:[id number] DOB: [birth year] DOA: 01/22/2020 PCP: Cleda Mccreedy, MD    Brief Narrative:  Patient was admitted to the hospital with a working diagnosis of large left subdural hematoma. Hospitalization complicated with right lower extremity DVT and swallow dysfunction.   59 year old male who presented with worsening left-sided weakness and aphasia. He does have significant past medical history for chronic subdural hematoma, obstructive uropathy status post chronic nephrostomy tube.  His initial physical examination blood pressure 114/72, heart rate 110, temperature 99, respiratory rate 17, he open his eyes to voice and track for short time, heart S1-S2 present, bradycardic, positive S4 gallop, lungs clear to auscultation bilaterally, soft abdomen, positive lower extremity edema, positive gross hematuria from left nephrostomy tube.   Head CT with a large mixed density left-sided subdural hematoma up to 19 mm in thickness, superimposed chronic left MCA territory encephalomalacia with only mild rightward midline shift of 4 mm.  Patient received protamine to reverse enoxaparin, underwent craniotomy 01/25/20.  Patient has been placed on Keppra for seizure precautions, speech therapy has recommended dysphagia 3 diet.  Patient continued to have persistent encephalopathy.  Patient has acute right common femoral and SF junction deep vein thrombosis, unable to place IVC filter by vascular surgery or interventional radiology.  Patient has resumed only prophylactic doses of enoxaparin due to risk of bleeding.  Slowly worsening overall condition, very poor oral intake, currently has an NG tube for feedings.  Palliative care team has been consulted.  Patient tolerating po intake with dysphagia 3 diet and aspiration precautions, he and his sister would like to avoid peg tube. Considering patient's condition Peg tube will put him at risk for aspiration, and likely will not  affect his nutritional prognosis. Patient will continue aggressive speech and swallow therapy.    Assessment & Plan:   Principal Problem:   Subdural hematoma, acute (HCC) Active Problems:   Essential hypertension   Diabetes mellitus without complication (HCC)   Chronic kidney disease, stage IV (severe) (HCC)   Retroperitoneal fibrosis   Right leg DVT (HCC)   SDH (subdural hematoma) ([place])     1. Acute on chronic subdural hematoma, complicated with encephalopathy and swallow dysfunction. His mentation continue to improve, patient is able to communicate appropriately, no confusion or agitation. He continue to have significant generalized deconditioning and paresis on the right upper extremity.   Patient would like to continue dysphagia 3 diet and aggressive speech and swallow therapy, he would like to hold on Peg tube.   Continue aspiration precautions, PT, OT and out of bed to chair as tolerated, tid with meals.   Discussed with Dr. Christella Noa from neurosurgery and will discontinue keppra at this point in time.  2. Right lower extremity DVT. [place] from 02/20 with age indeterminate right common femoral vein and SF junction, findings appear improved from previous US.   Considering risk and benefit, will continue prophylactic anticoagulation for now.   3. CKD stage IV/ urinary retention/ hyperkalemia/ retroperitoneal fibrosis.  Stable renal function with serum cr at 2,16 with K at 4,8 and serum bicarbonate at 22. Patient on mycophenolate. Will continue to hold on furosemide for now.   4. HTN. Blood pressure has remained well controlled, 130/83 today. Will dc PRN blood pressure agents.  5. Moderate Calorie protein malnutrition. On nutritional support. Continue with  steroids trial for appetite stimulantion.   6. T2DM. Fasting glucose is 184, will continue glucose control with basal insulin along with insulin sliding scale. Continue  to encourage po intake. Patient now will be off tube  feedings, will decrease dose of basal insulin.    DVT prophylaxis: enoxaparin   Code Status:  full Family Communication: no family at the bedside Disposition Plan/ discharge barriers: patient from home, current barrier for discharge is significant deconditioning, requiring high nursing care. Pending placement at SNF    Consultants:   Palliative  Vascular surgery   IR   Subjective: Patient is feeling well, no nausea or vomiting, no significant headaches. Continue to have right upper extremities weakness, he agrees to continue by mouth eating and avoid peg tube.   Objective: Vitals:   02/11/20 2007 02/11/20 2345 02/12/20 0341 02/12/20 0733  BP:  131/81 136/82 121/82  Pulse: 100 98 (!) 101 97  Resp: 18 16 16 13   Temp: 98.4 F (36.9 C) 98.6 F (37 C) 98.6 F (37 C) 98 F (36.7 C)  TempSrc: Oral Axillary Oral Axillary  SpO2: 99% 100% 99% 99%  Weight:      Height:        Intake/Output Summary (Last 24 hours) at 02/12/2020 0910 Last data filed at 02/12/2020 6222 Gross per 24 hour  Intake 0 ml  Output 2175 ml  Net -2175 ml   Filed Weights   01/29/20 1200 02/03/20 0900 02/10/20 0228  Weight: 109.2 kg 109.3 kg 112.7 kg    Examination:   General: deconditioned  Neurology: Awake and alert, non focal. Somnolent this am but easy to arouse, continue to have right upper extremity weakness.   E ENT: no pallor, no icterus, oral mucosa moist/ NG tube in place Cardiovascular: No JVD. S1-S2 present, rhythmic, no gallops, rubs, or murmurs. No lower extremity edema. Pulmonary: positive breath sounds bilaterally, adequate air movement, no wheezing, rhonchi or rales. Gastrointestinal. Abdomen protuberant with no organomegaly, non tender, no rebound or guarding Skin. No rashes Musculoskeletal: no joint deformities     Data Reviewed: I have personally reviewed following labs and imaging studies  CBC: Recent Labs  Lab 02/06/20 0632 02/08/20 0507 02/09/20 0415  02/10/20 0500 02/11/20 0536  WBC 5.5 5.6 6.1 5.4 6.1  NEUTROABS  --  3.4 3.9 3.2 3.7  HGB 8.6* 9.6* 10.2* 9.4* 9.3*  HCT 28.9* 32.5* 32.4* 31.6* 30.7*  MCV 92.6 93.9 89.8 92.9 91.4  PLT 240 212 PLATELET CLUMPS NOTED ON SMEAR, UNABLE TO ESTIMATE 243 979   Basic Metabolic Panel: Recent Labs  Lab 02/08/20 0507 02/09/20 0415 02/10/20 0500 02/11/20 0536 02/12/20 0358  NA 136 135 135 135 136  K 4.2 4.7 4.0 4.4 4.8  CL 99 100 99 100 102  CO2 24 21* 23 24 22   GLUCOSE 168* 180* 151* 165* 184*  BUN 52* 50* 50* 52* 49*  CREATININE 2.19* 2.28* 2.33* 2.29* 2.16*  CALCIUM 8.8* 8.8* 8.9 8.9 9.2  MG 2.0 2.0 1.9 2.0  --   PHOS 4.1 4.2 3.8 3.6  --    GFR: Estimated Creatinine Clearance: 47.8 mL/min (A) (by C-G formula based on SCr of 2.16 mg/dL (H)). Liver Function Tests: Recent Labs  Lab 02/08/20 0507 02/09/20 0415 02/10/20 0500 02/11/20 0536 02/12/20 0358  AST 52* 53* 24 23 21   ALT 94* 69* 56* 46* 40  ALKPHOS 91 80 76 83 80  BILITOT 0.6 1.2 0.6 0.5 0.6  PROT 6.9 6.8 6.8 6.8 6.9  ALBUMIN 2.7* 2.6* 2.6* 2.6* 2.7*   No results for input(s): LIPASE, AMYLASE in the last 168 hours. No results for input(s): AMMONIA in the last 168 hours.  Coagulation Profile: No results for input(s): INR, PROTIME in the last 168 hours. Cardiac Enzymes: No results for input(s): CKTOTAL, CKMB, CKMBINDEX, TROPONINI in the last 168 hours. BNP (last 3 results) No results for input(s): PROBNP in the last 8760 hours. HbA1C: No results for input(s): HGBA1C in the last 72 hours. CBG: Recent Labs  Lab 02/11/20 1642 02/11/20 2006 02/11/20 2329 02/12/20 0349 02/12/20 0732  GLUCAP 212* 194* 173* 173* 195*   Lipid Profile: No results for input(s): CHOL, HDL, LDLCALC, TRIG, CHOLHDL, LDLDIRECT in the last 72 hours. Thyroid Function Tests: No results for input(s): TSH, T4TOTAL, FREET4, T3FREE, THYROIDAB in the last 72 hours. Anemia Panel: No results for input(s): VITAMINB12, FOLATE, FERRITIN, TIBC,  IRON, RETICCTPCT in the last 72 hours.    Radiology Studies: I have reviewed all of the imaging during this hospital visit personally     Scheduled Meds: . chlorhexidine  15 mL Mouth Rinse BID  . Chlorhexidine Gluconate Cloth  6 each Topical Daily  . dexamethasone  1 mg Oral BID WC  . docusate  50 mg Oral Daily  . famotidine  20 mg Oral QHS  . feeding supplement (NEPRO CARB STEADY)  1,000 mL Oral Q24H  . feeding supplement (NEPRO CARB STEADY)  237 mL Oral BID BM  . feeding supplement (PRO-STAT SUGAR FREE 64)  30 mL Oral BID  . heparin injection (subcutaneous)  5,000 Units Subcutaneous Q8H  . insulin aspart  0-9 Units Subcutaneous Q4H  . insulin detemir  28 Units Subcutaneous BID  . levETIRAcetam  500 mg Oral Q12H  . mouth rinse  15 mL Mouth Rinse BID  . mycophenolate  500 mg Oral BID  . sodium chloride flush  10-40 mL Intracatheter Q12H   Continuous Infusions: . sodium chloride Stopped (02/02/20 0535)  . sodium chloride       LOS: 20 days        Brinda Focht Gerome Apley, MD

## 2020-02-12 NOTE — Progress Notes (Addendum)
Nutrition Follow-up  DOCUMENTATION CODES:   Not applicable  INTERVENTION:  Provide Nepro Shake po QID, each supplement provides 425 kcal and 19 grams protein  Provide 30 ml Prostat po TID, each supplement provides 100 kcal and 15 grams of protein.   Encourage adequate PO intake.   If pt refuses PO, may need to consider restarting enteral nutrition using Nepro formula at goal rate of 70 ml/hr x 14 hours (5pm - 7am) to provide 1764 kcal, 79 grams of protein, 715 ml free water.  NUTRITION DIAGNOSIS:   Inadequate oral intake related to decreased appetite as evidenced by meal completion < 50%; ongoing  GOAL:   Patient will meet greater than or equal to 90% of their needs; progressing  MONITOR:   PO intake, Supplement acceptance, TF tolerance  REASON FOR ASSESSMENT:   Consult Other (Comment)("review all feeds and reduce potassium")  ASSESSMENT:   Patient with PMH significant for CKD IV, DM, essential HTN, GERD, CVA with residual hemiparesis (R side), HLD, and PVD. Presents this admission with acute on chronic SDH with AMS.  2/19 cortrak placed 2/22 L crani; percutaneous nephrostomy tube exchange  2/23 TF started; Dysphagia 2/Thin liquids started; PO 0-10% 3/1 diet advanced to Dysphagia 3/Thin liquids   Tube feeding orders have been discontinued via MD. Cortrak NGT has been removed per MD orders. Pt desires to continue dysphagia 3 diet and SLP therapy. Pt and family decided to hold on PEG placement. Meal completion continues to be poor at 0-10%. Pt reports no appetite. MD has initiated steroids trial for appetite stimulation. Pt has been encouraged to eat at meals. RN reports meal intake is poor, however pt able to consume fluids and liquids well. Pt has been consuming his nutritional supplements. RD to increase nutritional supplement orders to aid in caloric and protein needs. If pt refuses po intake including not taking his nutritional supplements, will need to consider restarting  tube feeding.   Labs and medications reviewed.   Diet Order:   Diet Order            DIET DYS 3 Room service appropriate? Yes with Assist; Fluid consistency: Thin  Diet effective now              EDUCATION NEEDS:   Not appropriate for education at this time  Skin:  Skin Assessment: Skin Integrity Issues: Skin Integrity Issues:: Incisions Incisions: head Other: MASD - buttocks, groin  Last BM:  3/7  Height:   Ht Readings from Last 1 Encounters:  01/23/20 6\' 1"  (1.854 m)    Weight:   Wt Readings from Last 1 Encounters:  02/10/20 112.7 kg    BMI:  Body mass index is 32.78 kg/m.  Estimated Nutritional Needs:   Kcal:  2200-2400 kcal  Protein:  110-125 grams  Fluid:  >/= 2.2 L/day    Corrin Parker, MS, RD, LDN RD pager number/after hours weekend pager number on Amion.

## 2020-02-12 NOTE — Progress Notes (Signed)
Daily Progress Note   [name]       Date: 02/12/2020 DOB: Jul 16, 1961  Age: 59 y.o. MRN#: [id number] Attending Physician: Tawni Millers Primary Care Physician: Cleda Mccreedy, MD Admit Date: 01/22/2020  Reason for Consultation/Follow-up: Establishing goals of care  Subjective: Patient in bed- not as interactive today. Continues to decline food. Noted plans for discharge back to [facility].  Spoke with his sister Elmo Putt- let her know of my conversation with Arash yesterday where he indicated he would not want a permanent feeding tube placed.  I discussed with Elmo Putt that if patient does not show improvement in po intake when he returns to SNF- then will likely need to revisit goals of care discussion- encouraged her to keep Jacobie's wishes at the center of those discussion.  Offered referral to outpatient Palliative to follow at SNF.   Review of Systems  Unable to perform ROS: Acuity of condition    Length of Stay: 20  Current Medications: Scheduled Meds:  . chlorhexidine  15 mL Mouth Rinse BID  . Chlorhexidine Gluconate Cloth  6 each Topical Daily  . dexamethasone  1 mg Oral BID WC  . docusate  50 mg Oral Daily  . famotidine  20 mg Oral QHS  . feeding supplement (NEPRO CARB STEADY)  237 mL Oral QID  . feeding supplement (PRO-STAT SUGAR FREE 64)  30 mL Oral TID  . heparin injection (subcutaneous)  5,000 Units Subcutaneous Q8H  . insulin aspart  0-9 Units Subcutaneous Q4H  . [START ON 02/13/2020] insulin detemir  28 Units Subcutaneous Daily  . mouth rinse  15 mL Mouth Rinse BID  . mycophenolate  500 mg Oral BID  . sodium chloride flush  10-40 mL Intracatheter Q12H    Continuous Infusions: . sodium chloride Stopped (02/02/20 0535)  . sodium chloride       PRN Meds: Place/Maintain arterial line **AND** sodium chloride, acetaminophen (TYLENOL) oral liquid 160 mg/5 mL, labetalol, lidocaine (PF), oxyCODONE, polyethylene glycol, sodium chloride flush  Physical Exam Vitals and nursing note reviewed.  Neurological:     Mental Status: He is alert.     Comments: aphasic             Vital Signs: BP 130/83 (BP Location: Left Arm)   Pulse 100   Temp 98.4  F (36.9 C) (Oral)   Resp 13   Ht 6\' 1"  (1.854 m)   Wt 112.7 kg   SpO2 99%   BMI 32.78 kg/m  SpO2: SpO2: 99 % O2 Device: O2 Device: Room Air O2 Flow Rate:    Intake/output summary:   Intake/Output Summary (Last 24 hours) at 02/12/2020 1615 Last data filed at 02/12/2020 1259 Gross per 24 hour  Intake 150 ml  Output 1875 ml  Net -1725 ml   LBM: Last BM Date: 02/08/20 Baseline Weight: Weight: 112 kg Most recent weight: Weight: 112.7 kg       Palliative Assessment/Data: PPS: 20%    Flowsheet Rows     Most Recent Value  Intake Tab  Referral Department  Hospitalist  Unit at Time of Referral  ICU  Palliative Care Primary Diagnosis  Neurology  Date Notified  02/02/20  Palliative Care Type  New Palliative care  Reason for referral  Clarify Goals of Care  Date of Admission  01/22/20  Date first seen by Palliative Care  02/03/20  # of days Palliative referral response time  1 Day(s)  # of days IP prior to Palliative referral  11  Clinical Assessment  Psychosocial & Spiritual Assessment  Palliative Care Outcomes      Patient Active Problem List   Diagnosis Date Noted  . Anorexia symptom   . Advanced care planning/counseling discussion   . Goals of care, counseling/discussion   . Palliative care by specialist   . Subdural hematoma, acute ([place]) 01/26/2020  . SDH (subdural hematoma) ([place]) 01/23/2020  . DVT (deep venous thrombosis) ([place]) 12/19/2019  . Right leg DVT ([place]) 12/18/2019  . Symptomatic anemia 03/22/2018  . History of DVT in adulthood 03/22/2018  . Gross  hematuria   . Nephrostomy tube displaced ([place]) 07/19/2017  . Pyelonephritis 02/16/2017  . 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 Coronary atherosclerosis of native coronary artery   . Essential hypertension   . Stroke ([place])   . Diabetes mellitus without complication ([place])   . Chronic kidney disease, stage IV (severe) ([place])   . GERD (gastroesophageal reflux disease)   . Glaucoma   . Hemiparesis affecting right side as late effect of cerebrovascular accident ([place])   . Enlarged prostate with urinary obstruction   . Constipation     Palliative Care Assessment & Plan   Patient Profile: 59 y.o.malewith past medical history of retroperitoneal fibrosis with chronic percutaneous nephrostomy, DVT on Lovenox, previous CVA, CKD IIIwho was admitted on2/18/2021with acute on chronic subdural hematoma and underwent evacuation on 2/21.  Assessment/Recommendations/Plan   S/P evacuation for subdural hematoma- remains significantly aphasic and with poor appetite- recommend continuing dexamethasone for completion of 7 day course- then stop if no improvement  TOC referral for outpatient f/u- patient is likely to decline and have repeat admission due to poor po intake  Goals of Care and Additional Recommendations:  Limitations on Scope of Treatment: Full Scope Treatment  Code Status:  Full code  Prognosis:   Unable to determine  Discharge Planning:  [facility] for rehab with Palliative care service follow-up  Thank you for allowing the Palliative Medicine Team to assist in the care of this patient.   Time In: 1545 Time Out: 1620 Total Time 35 mins Prolonged Time Billed no      Greater than 50%  of this time was spent counseling and coordinating care related to the above assessment and plan.  Mariana Kaufman, AGNP-C Palliative Medicine   Please contact Palliative Medicine Team  phone at [phone] for questions and concerns.

## 2020-02-12 NOTE — TOC Initial Note (Signed)
Transition of Care [facility]) - Initial/Assessment Note    Patient Details  Name: Brendan [name] MRN: [id number] Date of Birth: 1961-04-17  Transition of Care [facility]) CM/SW Contact:    Vinie Sill, [place] Phone Number: 02/12/2020, 12:08 PM  Clinical Narrative:                  CSW spoke with patient's sister,Alicia by phone. CSW introduced self and explained role.  Patient's sister inquired about patient going to another SNF. CSW advised patient is under parole and must return back to [facility]. She will need to discuss transfer with his parole officer and the SNF. She states she understands.  CSW contacted [facility] to confirm disposition- possible discharge tomorrow. Covid test requested.  CSW will continue to follow and assist with discharge planning.  Thurmond Butts, MSW, [place] Clinical Social Worker   Expected Discharge Plan: Skilled Nursing Facility Barriers to Discharge: Continued Medical Work up   Patient Goals and CMS Choice        Expected Discharge Plan and Services Expected Discharge Plan: [place] In-house Referral: Clinical Social Work Discharge Planning Services: CM Consult   Living arrangements for the past 2 months: [place]                                      Prior Living Arrangements/Services Living arrangements for the past 2 months: [place] Lives with:: Facility Resident Patient language and need for interpreter reviewed:: No Do you feel safe going back to the place where you live?: Yes      Need for Family Participation in Patient Care: Yes (Comment) Care giver support system in place?: Yes (comment)   Criminal Activity/Legal Involvement Pertinent to Current Situation/Hospitalization: No - Comment as needed  Activities of Daily Living Home Assistive Devices/Equipment: Wheelchair ADL Screening (condition at time of admission) Patient's cognitive ability adequate to safely complete  daily activities?: No Is the patient deaf or have difficulty hearing?: No Does the patient have difficulty seeing, even when wearing glasses/contacts?: No Does the patient have difficulty concentrating, remembering, or making decisions?: No Patient able to express need for assistance with ADLs?: No Does the patient have difficulty dressing or bathing?: No Independently performs ADLs?: No Communication: Independent Dressing (OT): Dependent Is this a change from baseline?: Pre-admission baseline Grooming: Dependent Is this a change from baseline?: Pre-admission baseline Feeding: Independent Bathing: Dependent Is this a change from baseline?: Pre-admission baseline Toileting: Dependent Is this a change from baseline?: Pre-admission baseline In/Out Bed: Dependent Is this a change from baseline?: Pre-admission baseline Walks in Home: Dependent Does the patient have difficulty walking or climbing stairs?: Yes Weakness of Legs: Both Weakness of Arms/Hands: Both  Permission Sought/Granted Permission sought to share information with : Family Supports Permission granted to share information with : Yes, Verbal Permission Granted  Share Information with NAME: [name]  Permission granted to share info w AGENCY: [place] granted to share info w Relationship: sister  Permission granted to share info w Contact Information: [phone]  Emotional Assessment       Orientation: : Oriented to Self, Oriented to Place Alcohol / Substance Use: Not Applicable Psych Involvement: No (comment)  Admission diagnosis:  SDH (subdural hematoma) ([place]) [S06.5X9A] Current use of long term anticoagulation [Z79.01] Subdural hematoma without coma ([place]) [S06.5X9A] Hematuria [R31.9] Subdural hematoma, acute ([place]) [M54.6T0P] Patient Active Problem List  Diagnosis Date Noted  . Anorexia symptom   . Advanced care planning/counseling discussion   . Goals of care, counseling/discussion    . Palliative care by specialist   . Subdural hematoma, acute ([place]) 01/26/2020  . SDH (subdural hematoma) ([place]) 01/23/2020  . DVT (deep venous thrombosis) ([place]) 12/19/2019  . Right leg DVT ([place]) 12/18/2019  . Symptomatic anemia 03/22/2018  . History of DVT in adulthood 03/22/2018  . Gross hematuria   . Nephrostomy tube displaced ([place]) 07/19/2017  . Pyelonephritis 02/16/2017  . 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 Coronary atherosclerosis of native coronary artery   . Essential hypertension   . Stroke ([employer])   . Diabetes mellitus without complication ([place])   . Chronic kidney disease, stage IV (severe) ([place])   . GERD (gastroesophageal reflux disease)   . Glaucoma   . 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 Enlarged prostate with urinary obstruction   . Constipation    PCP:  Cleda Mccreedy, MD Pharmacy:  No Pharmacies Listed    Social Determinants of Health (SDOH) Interventions    Readmission Risk Interventions No flowsheet data found.

## 2020-02-13 LAB — GLUCOSE, CAPILLARY
Glucose-Capillary: 125 mg/dL — ABNORMAL HIGH (ref 70–99)
Glucose-Capillary: 84 mg/dL (ref 70–99)
Glucose-Capillary: 227 mg/dL — ABNORMAL HIGH (ref 70–99)

## 2020-02-13 MED ORDER — NEPRO/CARBSTEADY PO LIQD: 237.0000 mL | Freq: Four times a day (QID) | ORAL | 0 refills | Status: AC

## 2020-02-13 MED ORDER — POLYETHYLENE GLYCOL 3350 17 G PO PACK: 17.0000 g | PACK | Freq: Every day | ORAL | 0 refills | Status: AC | PRN

## 2020-02-13 MED ORDER — PRO-STAT SUGAR FREE PO LIQD: 30.0000 mL | Freq: Three times a day (TID) | ORAL | 0 refills | Status: AC

## 2020-02-13 MED ORDER — FAMOTIDINE 20 MG PO TABS: 20.0000 mg | ORAL_TABLET | Freq: Every day | ORAL | 0 refills | Status: AC

## 2020-02-13 MED ORDER — HEPARIN SODIUM (PORCINE) 5000 UNIT/ML IJ SOLN: 5000.0000 [IU] | Freq: Three times a day (TID) | INTRAMUSCULAR | 0 refills | Status: AC

## 2020-02-13 NOTE — TOC Transition Note (Signed)
Transition of Care [facility]) - CM/SW Discharge Note   Patient Details  Name: Brendan [name] MRN: [id number] Date of Birth: 11-20-1961  Transition of Care [facility]) CM/SW Contact:  Vinie Sill, [place] Phone Number: 02/13/2020, 1:58 PM   Clinical Narrative:     Patient will DC to: [place] Date: 02/13/20 Family Notified: Engineer, manufacturing Transport By: Corey Harold  RN, patient, and facility notified of DC. Discharge Summary sent to facility. RN given number for [phone]. Ambulance transport requested for patient.   Clinical Social Worker signing off.  Thurmond Butts, MSW, [place] Clinical Social Worker    Final next level of care: Skilled Nursing Facility Barriers to Discharge: No Barriers Identified   Patient Goals and CMS Choice        Discharge Placement              Patient chooses bed at: [facility]) Patient to be transferred to facility by: [place] Name of family member notified: Elmo Putt ,sister - left voice message Patient and family notified of of transfer: 02/13/20  Discharge Plan and Services In-house Referral: Clinical Social Work Discharge Planning Services: CM Consult                                 Social Determinants of Health (SDOH) Interventions     Readmission Risk Interventions No flowsheet data found.

## 2020-02-13 NOTE — Progress Notes (Signed)
Report given to nurse Jenny Reichmann) at [facility]. Gave her my direct number in case she has any further questions. Katherina Right RN

## 2020-02-13 NOTE — NC FL2 (Signed)
[place] LEVEL OF CARE SCREENING TOOL     IDENTIFICATION  [name] Birthdate: 1961/07/17 Sex: male Admission Date (Current Location): 01/22/2020  [facility] and [place] Number:  [employer] and Address:  [facility] [place] [place] [address] [place], [place] [address]      Provider Number: [id number]  Attending Physician Name and Address:  Tawni Millers,*  Relative Name and Phone Number:  [name] [phone]    Current Level of Care: Hospital Recommended Level of Care: [place] Prior Approval Number:    Date Approved/Denied:   PASRR Number: [id number]  Discharge Plan: SNF    Current Diagnoses: Patient Active Problem List   Diagnosis Date Noted  . Anorexia symptom   . Advanced care planning/counseling discussion   . Goals of care, counseling/discussion   . Palliative care by specialist   . Subdural hematoma, acute ([place]) 01/26/2020  . SDH (subdural hematoma) ([place]) 01/23/2020  . DVT (deep venous thrombosis) ([place]) 12/19/2019  . Right leg DVT ([place]) 12/18/2019  . Symptomatic anemia 03/22/2018  . History of DVT in adulthood 03/22/2018  . Gross hematuria   . Nephrostomy tube displaced ([place]) 07/19/2017  . Pyelonephritis 02/16/2017  . 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 Coronary atherosclerosis of native coronary artery   . Essential hypertension   . Stroke ([place])   . Diabetes mellitus without complication ([place])   . Chronic kidney disease, stage IV (severe) ([place])   . GERD (gastroesophageal reflux disease)   . Glaucoma   . Hemiparesis affecting right side as late effect of cerebrovascular accident ([place])   . Enlarged prostate with urinary obstruction   . Constipation     Orientation RESPIRATION BLADDER Height & Weight     Self  Normal Incontinent Weight: 248 lb 7.3 oz (112.7 kg) Height:  6\' 1"  (185.4 cm)  BEHAVIORAL SYMPTOMS/MOOD NEUROLOGICAL BOWEL NUTRITION STATUS   Incontinent Diet(see discharge summary)  AMBULATORY STATUS COMMUNICATION OF NEEDS Skin   Extensive Assist Non-Verbally Normal, Surgical wounds(surgical wound on head)                       Personal Care Assistance Level of Assistance  Bathing, Feeding, Dressing Bathing Assistance: Maximum assistance Feeding assistance: Limited assistance Dressing Assistance: Maximum assistance     Functional Limitations Info  Speech     Speech Info: Impaired(expressive aphasia)    SPECIAL CARE FACTORS FREQUENCY  PT (By licensed PT), OT (By licensed OT), Speech therapy     PT Frequency: 5x a week OT Frequency: 5x a week     Speech Therapy Frequency: 5x a week      Contractures Contractures Info: Not present    Additional Factors Info  Code Status, Insulin Sliding Scale Code Status Info: Full Allergies Info: TB serum, reaction unknown   Insulin Sliding Scale Info: insulin aspart (novoLOG) injection 0-9 Units       Current Medications (02/13/2020):  This is the current hospital active medication list Current Facility-Administered Medications  Medication Dose Route Frequency Provider Last Rate Last Admin  . 0.9 %  sodium chloride infusion   Intravenous Continuous Ashok Pall, MD   Stopped at 02/02/20 0535  . 0.9 %  sodium chloride infusion   Intra-arterial PRN Ashok Pall, MD      . acetaminophen (TYLENOL) 160 MG/5ML solution 325 mg  325 mg Oral Q4H PRN Ashok Pall, MD   325 mg at 02/13/20 0928  . Chlorhexidine Gluconate Cloth  2 % PADS 6 each  6 each Topical Daily Ashok Pall, MD   6 each at 02/09/20 1013  . dexamethasone (DECADRON) tablet 1 mg  1 mg Oral BID WC Earlie Counts, NP   1 mg at 02/13/20 1144  . docusate (COLACE) 50 MG/5ML liquid 50 mg  50 mg Oral Daily Charlynne Cousins, MD   50 mg at 02/13/20 0928  . famotidine (PEPCID) tablet 20 mg  20 mg Oral QHS Charlynne Cousins, MD   20 mg at 02/12/20 2301  . feeding supplement (NEPRO CARB STEADY) liquid 237 mL  237  mL Oral QID Tawni Millers, MD   237 mL at 02/13/20 0934  . feeding supplement (PRO-STAT SUGAR FREE 64) liquid 30 mL  30 mL Oral TID Tawni Millers, MD   30 mL at 02/13/20 0928  . heparin injection 5,000 Units  5,000 Units Subcutaneous Q8H Jennelle Human B, NP   5,000 Units at 02/13/20 0501  . insulin aspart (novoLOG) injection 0-9 Units  0-9 Units Subcutaneous Q4H Charlynne Cousins, MD   3 Units at 02/13/20 1145  . insulin detemir (LEVEMIR) injection 28 Units  28 Units Subcutaneous Daily Tawni Millers, MD   28 Units at 02/13/20 [phone]  . labetalol (NORMODYNE) injection 5-20 mg  5-20 mg Intravenous Q30 min PRN Ashok Pall, MD   20 mg at 01/27/20 1822  . lidocaine (PF) (XYLOCAINE) 1 % injection   Infiltration PRN Corrie Mckusick, DO   10 mL at 01/26/20 1204  . MEDLINE mouth rinse  15 mL Mouth Rinse BID Ashok Pall, MD   15 mL at 02/13/20 1001  . mycophenolate (CELLCEPT) capsule 500 mg  500 mg Oral BID Charlynne Cousins, MD   500 mg at 02/13/20 8469  . oxyCODONE (Oxy IR/ROXICODONE) immediate release tablet 5 mg  5 mg Oral Q8H PRN Charlynne Cousins, MD   5 mg at 02/13/20 0249  . polyethylene glycol (MIRALAX / GLYCOLAX) packet 17 g  17 g Oral Daily PRN Ashok Pall, MD      . sodium chloride flush (NS) 0.9 % injection 10-40 mL  10-40 mL Intracatheter Q12H Byrum, Rose Fillers, MD   10 mL at 02/13/20 1001  . sodium chloride flush (NS) 0.9 % injection 10-40 mL  10-40 mL Intracatheter PRN Collene Gobble, MD   10 mL at 02/07/20 2003     Discharge Medications: Please see discharge summary for a list of discharge medications.  Relevant Imaging Results:  Relevant Lab Results:   Additional Information SSN [id number]    palliative to follow at [place], [place]

## 2020-02-13 NOTE — Discharge Summary (Addendum)
Physician Discharge Summary  Brendan [name] OEV:[id number] DOB: 1961-03-13 DOA: 01/22/2020  PCP: Cleda Mccreedy, MD  Admit date: 01/22/2020 Discharge date: 02/13/2020  Admitted From: SNF  Disposition:  SNF   Recommendations for Outpatient Follow-up and new medication changes:  1. Follow up with Dr. Eula Fried in 7 days.  2. Continue to encourage po intake and continue nutritional supplements. 3. Close follow up of renal function and electrolytes. 4. Monitor for recurrent hematuria.   5. Continue capillary glucose monitoring.   Home Health: na   Equipment/Devices: na    Discharge Condition: stable  CODE STATUS: full  Diet recommendation: heart healthy and diabetic prudent.   Speech therapy recommendations:   Diet recommendations: Regular;Thin liquid Liquids provided via: Cup;Straw Medication Administration: Whole meds with liquid Supervision: Staff to assist with self feeding Compensations: Slow rate;Small sips/bites Postural Changes and/or Swallow Maneuvers: Seated upright 90 degrees    Brief/Interim Summary: Patient was admitted to the hospital with a working diagnosis of large left subdural hematoma.Hospitalization complicated with right lower extremity DVT and swallow dysfunction.   59 year old male who presented with worsening left-sided weakness and aphasia. [place] have significant past medical history for chronic subdural hematoma,obstructive uropathy status post chronic nephrostomy tube.On his initial physical examination blood pressure 114/72, heart rate 110, temperature 99, respiratory rate 17,he opened his eyes to voice and track for short time, heart S1-S2 present, bradycardic, positive S4 gallop, lungs clear to auscultation bilaterally, soft abdomen, positive lower extremity edema, positive gross hematuria from left nephrostomy tube.  Sodium 134, potassium 4.9, chloride 99, bicarb 22, glucose 171, BUN 64, creatinine 3.38, white count 9.7, hemoglobin 6.4, hematocrit  22.2, platelets 376.  SARS COVID-19 was negative.  His urinalysis had more than 50 red cells, 0-5 white cells.  Head CT with a large mixed density left-sided subdural hematoma up to 19 mm in thickness,superimposedchronic left MCA territory encephalomalacia with only mild rightward midline shift of 4 mm.  His chest radiograph was negative for infiltrates.  EKG had 124 bpm, normal axis, normal intervals, sinus rhythm, no significant ST segment or T wave changes.  Patient received protamine to reverse enoxaparin,underwent craniotomy2/21/21.Patient has been placed on Keppra for seizure precautions,speech therapy has recommended dysphagia 3 diet.Patient continued to have persistent encephalopathy.  Patient has found to have a right common femoral and SF junction deep vein thrombosis, unable to place IVC filter by vascular surgery or interventional radiology. Patient has resumed onlyprophylactic doses of heparin due to risk of bleeding.  Slowly worsening overall condition, very poor oral intake, required NG tube for feedings. Palliative care team has been consulted.  Prolonged hospitalization, patient eventually tolerating po intake with dysphagia 3 diet and aspiration precautions, he and his sister would like to avoid peg tube. Considering patient's condition Peg tube will put him at risk for aspiration, and likely will not affect his nutritional prognosis. Patient will continue aggressive speech and swallow therapy.   On 0312 his diet has been advanced to regular.   1.  Acute on chronic subdural hematoma on the left side, complicated by metabolic encephalopathy and swallow dysfunction.  Patient had a prolonged hospitalization.  He received supportive medical therapy, and close neurologic monitoring.  Patient had a follow-up CT head with demonstrated progression of his acute on chronic left convexity subdural hematoma, with mild increase in mass-effect.  Neurology concluded that his  hematoma was growing and unlikely to resolve by its own.   Patient underwent left frontotemporoparietal craniotomy for subdural hematoma evacuation on 2/22.   Patient had  a prolonged hospitalization, slow recovery. He developed metabolic encephalopathy with a swallow dysfunction.  He had an NG tube placed and received tube feeds.  Slowly he has been improving, his diet has been modified to prevent aspiration.  On March 12 he was allowed to have regular diet.  No seizure activity and Keppra was discontinued.  2.  Right lower extremity deep vein thrombosis, right common femoral vein and SF junction.  He had a recent hospitalization 12/18/2019 at [facility], he was diagnosed with right lower extremity deep vein thrombosis, his treatment was complicated by gross hematuria.  Rivaroxaban was discontinued and patient was discharged on enoxaparin.  At the skilled nursing facility patient continued to have hematuria.  On February 9 vascular surgery Dr. Trula Slade was unable to place an inferior vena cava filter as the infrarenal IVC was occluded.  During this hospitalization the case was discussed with Dr. Earleen Newport, from interventional radiology, patient not candidate for IR procedure.  Repeat ultrasonography of the lower extremity on February 20 showed finding consistent with age indeterminate deep vein thrombosis involving the right common femoral vein and SF junction, findings appear improved from venous examination.  He has been placed on a prophylactic subcutaneous heparin with good toleration.  3.  Acute on chronic kidney disease stage IV, urinary retention, hyperkalemia, retroperitoneal fibrosis, left percutaneous nephrostomy.  Patient received supportive medical therapy, his kidney function improved, with a discharge creatinine of 2.16, potassium 4.8, serum bicarbonate 22.  Follow-up kidney function as an outpatient.  His percutaneous nephrostomy tube was exchanged 2/22.  Patient will continue  mycophenolate.  Hold on furosemide, continue oral sodium bicarb and cholecalciferol.   4.  Hypertension.  Patient's blood pressure minimal control.  5.  Moderate calorie protein malnutrition.  Patient received nutritional supplements, short course of steroids as appetite stimulant.  6.  Type 2 diabetes mellitus, hemoglobin A1c 5.0. dyslipidemia  Glucose remained well controlled while hospitalized, patient received insulin sliding scale along with basal insulin during his hospitalization. Continue diet control and glucose monitoring.   Continue with simvastatin and ezetimibe.   7. Acute on chronic anemia with acute blood loss due to hematuria.  Patient received packed red blood cell transfusion with good toleration.  Hematuria resolved, his discharge hemoglobin is 9.3, hematocrit 30.7.  Discharge Diagnoses:  Principal Problem:   Subdural hematoma, acute (HCC) Active Problems:   Essential hypertension   Diabetes mellitus without complication (HCC)   Chronic kidney disease, stage IV (severe) (HCC)   Retroperitoneal fibrosis   Right leg DVT (HCC)   SDH (subdural hematoma) ([place])    Discharge Instructions   Allergies as of 02/13/2020      Reactions   Other    TB serum-reaction unknown. Provided via [facility] records      Medication List    STOP taking these medications   aspirin EC 81 MG tablet   enoxaparin 120 MG/0.8ML injection Commonly known as: LOVENOX   furosemide 20 MG tablet Commonly known as: LASIX   oxyCODONE-acetaminophen 5-325 MG tablet Commonly known as: PERCOCET/ROXICET     TAKE these medications   acetaminophen 325 MG tablet Commonly known as: TYLENOL Take 650 mg by mouth every 8 (eight) hours as needed for moderate pain or headache.   Caltrate 600 1500 (600 Ca) MG Tabs tablet Generic drug: calcium carbonate Take 1 tablet by mouth 2 (two) times daily.   cetaphil lotion Apply 1 application topically 2 (two) times daily. Apply to arms, legs, and feet    Cholecalciferol 25  MCG (1000 UT) tablet Take 2,000 Units by mouth daily.   ezetimibe 10 MG tablet Commonly known as: ZETIA Take 10 mg by mouth every evening.   famotidine 20 MG tablet Commonly known as: PEPCID Take 1 tablet (20 mg total) by mouth at bedtime.   feeding supplement (NEPRO CARB STEADY) Liqd Take 237 mLs by mouth 4 (four) times daily.   feeding supplement (PRO-STAT SUGAR FREE 64) Liqd Take 30 mLs by mouth 3 (three) times daily.   guaiFENesin 600 MG 12 hr tablet Commonly known as: MUCINEX Take 600 mg by mouth 2 (two) times daily.   heparin 5000 UNIT/ML injection Inject 1 mL (5,000 Units total) into the skin every 8 (eight) hours.   loratadine 10 MG tablet Commonly known as: CLARITIN Take 10 mg by mouth daily.   mycophenolate 500 MG tablet Commonly known as: CELLCEPT Take 500 mg by mouth 2 (two) times daily.   polyethylene glycol 17 g packet Commonly known as: MIRALAX / GLYCOLAX Take 17 g by mouth daily as needed for mild constipation.   senna 8.6 MG Tabs tablet Commonly known as: SENOKOT Take 8.6 mg by mouth daily.   simvastatin 40 MG tablet Commonly known as: ZOCOR Take 40 mg by mouth at bedtime.   sodium bicarbonate 650 MG tablet Take 650 mg by mouth 3 (three) times daily.   tamsulosin 0.4 MG Caps capsule Commonly known as: FLOMAX Take 0.4 mg by mouth at bedtime.      Follow-up Information    Ashok Pall, MD Follow up in 3 week(s).   Specialty: Neurosurgery Why: call to make an appointment for 3weeks Contact information: [address] [place] [address] [phone]          Allergies  Allergen Reactions  . Other     TB serum-reaction unknown. Provided via [facility] records     Consultations:  Palliative care team  Neurosurgery  Interventional radiology   Procedures/Studies: DG Abd 1 View  Result Date: 01/24/2020 CLINICAL DATA:  Presence of IVC filter EXAM: ABDOMEN - 1 VIEW COMPARISON:  None.  FINDINGS: No IVC filter in typical location. Feeding tube with tip is at the proximal duodenum. Catheter over the left flank. IMPRESSION: Negative for IVC filter. Electronically Signed   By: Monte Fantasia M.D.   On: 01/24/2020 08:48   CT HEAD WO CONTRAST  Result Date: 02/02/2020 CLINICAL DATA:  Subdural hematoma. EXAM: CT HEAD WITHOUT CONTRAST TECHNIQUE: Contiguous axial images were obtained from the base of the skull through the vertex without intravenous contrast. COMPARISON:  CT head without contrast 01/29/2020 FINDINGS: Brain: Left frontal craniotomy is again noted. Residual mixed density extra-axial hemorrhage over the left convexity is slightly smaller than on the prior study. Maximal coronal measurement is now 12 mm compared with 13 mm. There is still mass effect. No new hemorrhage is present. Minimal pneumocephalus remains. Blood along the tentorium is stable to slightly decreased. Left-sided white matter infarcts are stable. Midline shift measures 4.5 mm, stable to slightly improved. Ventricles are within normal limits. The brainstem and cerebellum are normal. Vascular: Minimal atherosclerotic changes are present within the cavernous internal carotid arteries. No hyperdense vessel is present. Skull: Frontal craniotomy is again noted. Sinuses/Orbits: Chronic bilateral maxillary sinus disease is present with inspissated secretions bilaterally. Chronic wall thickening is noted. Mild mucosal thickening is present throughout the right ethmoid air cells. No fluid levels are present. The mastoid air cells are clear. The globes and orbits are within normal limits. IMPRESSION: 1.  Slight decrease in size of residual mixed density extra-axial hemorrhage over the left convexity. 2. Stable to slightly improved midline shift. 3. No new hemorrhage. 4. Stable left-sided white matter infarcts. 5. Chronic sinus disease. Electronically Signed   By: San Morelle M.D.   On: 02/02/2020 05:44   CT HEAD WO  CONTRAST  Result Date: 01/29/2020 CLINICAL DATA:  Subdural hemorrhage post evacuation EXAM: CT HEAD WITHOUT CONTRAST TECHNIQUE: Contiguous axial images were obtained from the base of the skull through the vertex without intravenous contrast. COMPARISON:  01/25/2020 FINDINGS: Brain: There are interval postoperative changes of left frontoparietal craniotomy for evacuation of subdural hematoma. There is extra-axial air, hemorrhage, and fluid measuring up to 1.4 cm in thickness. There is decreased mass effect including rightward midline shift which now measures 4 mm at the level of the foramen of [place]ro. Small volume subdural hemorrhage is again noted along the tentorium. There is a new area of parenchymal hypoattenuation left superior frontal gyrus (series 3, image 25). Chronic left frontal infarction again noted. There is no hydrocephalus. Vascular: There is atherosclerotic calcification at the skull base. Skull: Postoperative changes.  Otherwise unremarkable. Sinuses/Orbits: Paranasal sinus inflammatory changes with chronic bilateral maxillary sinusitis. Other: Postoperative changes in the subcutaneous tissues overlying the craniotomy. Mastoid air cells are clear IMPRESSION: Interval postoperative changes of left subdural hematoma evacuation. Resulting improvement in mass effect. New small area of left superior frontal gyrus hypoattenuation without definite loss of gray-white differentiation. Attention on follow-up is recommended. Electronically Signed   By: Macy Mis M.D.   On: 01/29/2020 19:32   CT HEAD WO CONTRAST  Result Date: 01/25/2020 CLINICAL DATA:  Follow-up subdural hematoma. EXAM: CT HEAD WITHOUT CONTRAST TECHNIQUE: Contiguous axial images were obtained from the base of the skull through the vertex without intravenous contrast. COMPARISON:  01/22/2020 FINDINGS: Brain: Some further enlargement of the subdural hematoma along the lateral convexity on the left. The hematoma is somewhat read  distributed, but the overall volume is estimated to be increased on the order of 1/3. Maximal thickness measured in the coronal plane now measures 2.5 cm compared with about 2 cm previously. More hyperdense component is present. Some subdural blood has also migrated along the posterior falx and tentorium. Persistent mass effect with left-to-right shift of 7 mm, increased from 4 mm previously. Old infarction in the left basal ganglia and radiating white matter tracts as seen previously. No evidence of acute infarction. No hydrocephalus or ventricular trapping. Vascular: There is atherosclerotic calcification of the major vessels at the base of the brain. Skull: Negative Sinuses/Orbits: Chronic inflammatory changes of the maxillary sinuses. Orbits negative. Other: None IMPRESSION: Some further enlargement of the subdural hematoma on the left. Overall volume increase estimated at about 1/3. Maximal thickness now measured at 2.5 cm compared with 2 cm previously. More hyperdense component. Summary distribution, including along the posterior falx and the tentorium. Increased mass effect with left-to-right shift of 7 mm today, increased from 4 mm previously. Electronically Signed   By: Nelson Chimes M.D.   On: 01/25/2020 03:40   DG CHEST PORT 1 VIEW  Result Date: 01/24/2020 CLINICAL DATA:  Fever EXAM: PORTABLE CHEST 1 VIEW COMPARISON:  12/18/2019 FINDINGS: Soft feeding tube enters the abdomen. Heart size remains normal. There is mild atelectasis at both lung bases. No lobar collapse. Upper lobes remain clear. No effusions. IMPRESSION: Mild bibasilar atelectasis. Electronically Signed   By: Nelson Chimes M.D.   On: 01/24/2020 04:51   IR NEPHROSTOMY EXCHANGE LEFT  Result Date:  01/26/2020 INDICATION: 59 year old male presents for routine exchange of left percutaneous nephrostomy EXAM: IR EXCHANGE NEPHROSTOMY LEFT COMPARISON:  None. MEDICATIONS: None ANESTHESIA/SEDATION: None CONTRAST:  34mL OMNIPAQUE IOHEXOL 300 MG/ML  SOLN - administered into the collecting system(s) FLUOROSCOPY TIME:  Fluoroscopy Time: 0 minutes 24 seconds (2 mGy). COMPLICATIONS: None PROCEDURE: Informed written consent was obtained from the patient after a thorough discussion of the procedural risks, benefits and alternatives. All questions were addressed. Maximal Sterile Barrier Technique was utilized including caps, mask, sterile gowns, sterile gloves, sterile drape, hand hygiene and skin antiseptic. A timeout was performed prior to the initiation of the procedure. Left: 1% lidocaine was used for local anesthesia. Small amount of contrast was infused confirming location in the collecting system. Modified Seldinger technique was then used to exchange for a new 10 [place] percutaneous nephrostomy. Catheter was formed in the collecting system and contrast confirmed location. Catheter was sutured in location and attached to gravity drainage. Patient tolerated the procedure well and remained hemodynamically stable throughout. No complications were encountered and no significant blood loss. IMPRESSION: Status post routine exchange of left PCN. Signed, Dulcy Fanny. Dellia Nims, RPVI [facility] Radiology Electronically Signed   By: Corrie Mckusick D.O.   On: 01/26/2020 12:26   CT HEAD CODE STROKE WO CONTRAST`  Result Date: 01/22/2020 CLINICAL DATA:  Code stroke. 59 year old male with difficulty speaking and altered mental status. EXAM: CT HEAD WITHOUT CONTRAST TECHNIQUE: Contiguous axial images were obtained from the base of the skull through the vertex without intravenous contrast. COMPARISON:  [facility] head CT 07/17/2016. FINDINGS: Brain: Lobulated mixed density broad-based left side subdural hematoma, new since 2017. This measures up to 19 mm in thickness and spans most of the left hemisphere. However, there is only mild rightward midline shift of 4 mm, with patchy chronic left MCA territory  encephalomalacia superimposed. Basilar cisterns remain patent. No ventriculomegaly. Gray-white matter differentiation in the right hemisphere and posterior fossa appears to remain normal. Vascular: No suspicious intracranial vascular hyperdensity. Skull: Calvarium intact. Sinuses/Orbits: Chronic sinusitis with some progression in the ethmoids since 2017. Tympanic cavities and mastoids remain clear. Other: No acute orbit or scalp soft tissue finding. ASPECTS [facility] Stroke Program Early CT Score) Total score (0-10 with 10 being normal): Not applicable, acute hemorrhage. IMPRESSION: 1. Large mixed density Left Side Subdural Hematoma, up to 19 mm in thickness. 2. Superimposed chronic left MCA territory encephalomalacia, with only mild rightward midline shift of 4 mm at this time. Critical Value/emergent results were called by telephone at the time of interpretation on 01/22/2020 at 9:58 pm to Dr. Madalyn Rob , who verbally acknowledged these results. Electronically Signed   By: Genevie Ann M.D.   On: 01/22/2020 22:00   VAS [place] LOWER EXTREMITY VENOUS (DVT)  Result Date: 01/24/2020  Lower Venous DVTStudy Indications: Swelling.  Risk Factors: Obstructive uropathy with chronic nephrostomy tube. Has IVC filter. Comparison Study: Prior study from 01/05/20 is available for comparison Performing Technologist: Sharion Dove RVS  Examination Guidelines: A complete evaluation includes B-mode imaging, spectral Doppler, color Doppler, and power Doppler as needed of all accessible portions of each vessel. Bilateral testing is considered an integral part of a complete examination. Limited examinations for reoccurring indications may be performed as noted. The reflux portion of the exam is performed with the patient in reverse Trendelenburg.  +---------+---------------+---------+-----------+----------+--------------+ RIGHT    CompressibilityPhasicitySpontaneityPropertiesThrombus Aging  +---------+---------------+---------+-----------+----------+--------------+ CFV      Partial        Yes  No                                  +---------+---------------+---------+-----------+----------+--------------+ SFJ      Partial                                                     +---------+---------------+---------+-----------+----------+--------------+ FV Prox  Full           Yes      Yes                                 +---------+---------------+---------+-----------+----------+--------------+ FV Mid   Full                                                        +---------+---------------+---------+-----------+----------+--------------+ FV DistalFull                                                        +---------+---------------+---------+-----------+----------+--------------+ PFV      Full                                                        +---------+---------------+---------+-----------+----------+--------------+ POP      Full                                                        +---------+---------------+---------+-----------+----------+--------------+ PTV      Full                                                        +---------+---------------+---------+-----------+----------+--------------+ PERO     Full                                                        +---------+---------------+---------+-----------+----------+--------------+ EIV      Full           Yes      Yes                                 +---------+---------------+---------+-----------+----------+--------------+   +---------+---------------+---------+-----------+----------+--------------+ LEFT     CompressibilityPhasicitySpontaneityPropertiesThrombus Aging +---------+---------------+---------+-----------+----------+--------------+ CFV      Full  Yes      Yes                                  +---------+---------------+---------+-----------+----------+--------------+ SFJ      Full                                                        +---------+---------------+---------+-----------+----------+--------------+ FV Prox  Full                                                        +---------+---------------+---------+-----------+----------+--------------+ FV Mid   Full                                                        +---------+---------------+---------+-----------+----------+--------------+ FV DistalFull                                                        +---------+---------------+---------+-----------+----------+--------------+ PFV      Full                                                        +---------+---------------+---------+-----------+----------+--------------+ POP      Full           Yes      Yes                                 +---------+---------------+---------+-----------+----------+--------------+ PTV      Full                                                        +---------+---------------+---------+-----------+----------+--------------+ PERO     Full                                                        +---------+---------------+---------+-----------+----------+--------------+     Summary: RIGHT: - Findings consistent with age indeterminate deep vein thrombosis involving the right common femoral vein, and SF junction. - Findings appear improved from previous examination. - Prior study from 01/05/20 showed age indeterminate DVT in the right proximal external iliac vein and the common femoral vein.  LEFT: - There is no evidence of deep vein thrombosis in the lower extremity.  *See table(s) above for  measurements and observations. Electronically signed by Deitra Mayo MD on 01/24/2020 at 4:57:17 PM.    Final    [place] EKG SITE RITE  Result Date: 01/29/2020 If [facility] image not attached, placement could not be confirmed  due to current cardiac rhythm.  [place] EKG SITE RITE  Result Date: 01/29/2020 If [facility] image not attached, placement could not be confirmed due to current cardiac rhythm.     Procedures:  Patient underwent left frontotemporoparietal craniotomy for subdural hematoma evacuation on 2/22.  His percutaneous nephrostomy tube was exchanged 2/22.  Subjective: Patient is feeling well, no nausea or vomiting, on chest pain. Easy to arouse and answers simple questions.   Discharge Exam: Vitals:   02/13/20 0725 02/13/20 1122  BP: 128/83 137/85  Pulse: 93 91  Resp: 13 13  Temp: 98.5 F (36.9 C) 98.5 F (36.9 C)  SpO2: 100% 99%   Vitals:   02/12/20 2315 02/13/20 0453 02/13/20 0725 02/13/20 1122  BP: 136/90 133/84 128/83 137/85  Pulse: 88 91 93 91  Resp:   13 13  Temp: 98.8 F (37.1 C) 98.3 F (36.8 C) 98.5 F (36.9 C) 98.5 F (36.9 C)  TempSrc: Axillary Oral Oral Oral  SpO2: 100% 100% 100% 99%  Weight:      Height:        General: Not in pain or dyspnea Neurology: Patient somnolent but easy to arouse. Right upper extremity paresis, stable.  E ENT: no pallor, no icterus, oral mucosa moist Cardiovascular: No JVD. S1-S2 present, rhythmic, no gallops, rubs, or murmurs. No lower extremity edema. Pulmonary: positive breath sounds bilaterally, adequate air movement, no wheezing, rhonchi or rales. Gastrointestinal. Abdomen with no organomegaly, non tender, no rebound or guarding Skin. No rashes Musculoskeletal: no joint deformities   The results of significant diagnostics from this hospitalization (including imaging, microbiology, ancillary and laboratory) are listed below for reference.     Microbiology: Recent Results (from the past 240 hour(s))  SARS CORONAVIRUS 2 (TAT 6-24 HRS) Nasopharyngeal Nasopharyngeal Swab     Status: None   Collection Time: 02/12/20 11:43 AM   Specimen: Nasopharyngeal Swab  Result Value Ref Range Status   SARS Coronavirus 2 NEGATIVE NEGATIVE Final     Comment: (NOTE) SARS-CoV-2 target nucleic acids are NOT DETECTED. The SARS-CoV-2 RNA is generally detectable in upper and lower respiratory specimens during the acute phase of infection. Negative results do not preclude SARS-CoV-2 infection, do not rule out co-infections with other pathogens, and should not be used as the sole basis for treatment or other patient management decisions. Negative results must be combined with clinical observations, patient history, and epidemiological information. The expected result is Negative. Fact Sheet for Patients: SugarRoll.be Fact Sheet for Healthcare Providers: https://www.woods-mathews.com/ This test is not yet approved or cleared by the [place] FDA and  has been authorized for detection and/or diagnosis of SARS-CoV-2 by FDA under an Emergency Use Authorization (EUA). This EUA will remain  in effect (meaning this test can be used) for the duration of the COVID-19 declaration under Section 56 4(b)(1) of the Act, 21 U.S.C. section 360bbb-3(b)(1), unless the authorization is terminated or revoked sooner. Performed at [facility], [place] [address], [place], [place] [address]      Labs: BNP (last 3 results) No results for input(s): BNP in the last 8760 hours. Basic Metabolic Panel: Recent Labs  Lab 02/08/20 0507 02/09/20 0415 02/10/20 0500 02/11/20 0536 02/12/20 0358  NA 136 135 135 135 136  K 4.2  4.7 4.0 4.4 4.8  CL 99 100 99 100 102  CO2 24 21* 23 24 22   GLUCOSE 168* 180* 151* 165* 184*  BUN 52* 50* 50* 52* 49*  CREATININE 2.19* 2.28* 2.33* 2.29* 2.16*  CALCIUM 8.8* 8.8* 8.9 8.9 9.2  MG 2.0 2.0 1.9 2.0  --   PHOS 4.1 4.2 3.8 3.6  --    Liver Function Tests: Recent Labs  Lab 02/08/20 0507 02/09/20 0415 02/10/20 0500 02/11/20 0536 02/12/20 0358  AST 52* 53* 24 23 21   ALT 94* 69* 56* 46* 40  ALKPHOS 91 80 76 83 80  BILITOT 0.6 1.2 0.6 0.5 0.6  PROT 6.9 6.8 6.8 6.8 6.9   ALBUMIN 2.7* 2.6* 2.6* 2.6* 2.7*   No results for input(s): LIPASE, AMYLASE in the last 168 hours. No results for input(s): AMMONIA in the last 168 hours. CBC: Recent Labs  Lab 02/08/20 0507 02/09/20 0415 02/10/20 0500 02/11/20 0536  WBC 5.6 6.1 5.4 6.1  NEUTROABS 3.4 3.9 3.2 3.7  HGB 9.6* 10.2* 9.4* 9.3*  HCT 32.5* 32.4* 31.6* 30.7*  MCV 93.9 89.8 92.9 91.4  PLT 212 PLATELET CLUMPS NOTED ON SMEAR, UNABLE TO ESTIMATE 243 256   Cardiac Enzymes: No results for input(s): CKTOTAL, CKMB, CKMBINDEX, TROPONINI in the last 168 hours. BNP: Invalid input(s): POCBNP CBG: Recent Labs  Lab 02/12/20 2020 02/12/20 2307 02/13/20 0452 02/13/20 0724 02/13/20 1120  GLUCAP 241* 174* 125* 84 227*   D-Dimer No results for input(s): DDIMER in the last 72 hours. Hgb A1c No results for input(s): HGBA1C in the last 72 hours. Lipid Profile No results for input(s): CHOL, HDL, LDLCALC, TRIG, CHOLHDL, LDLDIRECT in the last 72 hours. Thyroid function studies No results for input(s): TSH, T4TOTAL, T3FREE, THYROIDAB in the last 72 hours.  Invalid input(s): FREET3 Anemia work up No results for input(s): VITAMINB12, FOLATE, FERRITIN, TIBC, IRON, RETICCTPCT in the last 72 hours. Urinalysis    Component Value Date/Time   COLORURINE RED (A) 01/23/2020 0243   APPEARANCEUR CLOUDY (A) 01/23/2020 0243   LABSPEC  01/23/2020 0243    TEST NOT REPORTED DUE TO COLOR INTERFERENCE OF URINE PIGMENT   PHURINE  01/23/2020 0243    TEST NOT REPORTED DUE TO COLOR INTERFERENCE OF URINE PIGMENT   GLUCOSEU (A) 01/23/2020 0243    TEST NOT REPORTED DUE TO COLOR INTERFERENCE OF URINE PIGMENT   HGBUR (A) 01/23/2020 0243    TEST NOT REPORTED DUE TO COLOR INTERFERENCE OF URINE PIGMENT   BILIRUBINUR (A) 01/23/2020 0243    TEST NOT REPORTED DUE TO COLOR INTERFERENCE OF URINE PIGMENT   KETONESUR (A) 01/23/2020 0243    TEST NOT REPORTED DUE TO COLOR INTERFERENCE OF URINE PIGMENT   PROTEINUR (A) 01/23/2020 0243    TEST  NOT REPORTED DUE TO COLOR INTERFERENCE OF URINE PIGMENT   NITRITE (A) 01/23/2020 0243    TEST NOT REPORTED DUE TO COLOR INTERFERENCE OF URINE PIGMENT   LEUKOCYTESUR (A) 01/23/2020 0243    TEST NOT REPORTED DUE TO COLOR INTERFERENCE OF URINE PIGMENT   Sepsis Labs Invalid input(s): PROCALCITONIN,  WBC,  LACTICIDVEN Microbiology Recent Results (from the past 240 hour(s))  SARS CORONAVIRUS 2 (TAT 6-24 HRS) Nasopharyngeal Nasopharyngeal Swab     Status: None   Collection Time: 02/12/20 11:43 AM   Specimen: Nasopharyngeal Swab  Result Value Ref Range Status   SARS Coronavirus 2 NEGATIVE NEGATIVE Final    Comment: (NOTE) SARS-CoV-2 target nucleic acids are NOT DETECTED. The SARS-CoV-2 RNA is generally detectable  in upper and lower respiratory specimens during the acute phase of infection. Negative results do not preclude SARS-CoV-2 infection, do not rule out co-infections with other pathogens, and should not be used as the sole basis for treatment or other patient management decisions. Negative results must be combined with clinical observations, patient history, and epidemiological information. The expected result is Negative. Fact Sheet for Patients: SugarRoll.be Fact Sheet for Healthcare Providers: https://www.woods-mathews.com/ This test is not yet approved or cleared by the [place] FDA and  has been authorized for detection and/or diagnosis of SARS-CoV-2 by FDA under an Emergency Use Authorization (EUA). This EUA will remain  in effect (meaning this test can be used) for the duration of the COVID-19 declaration under Section 56 4(b)(1) of the Act, 21 U.S.C. section 360bbb-3(b)(1), unless the authorization is terminated or revoked sooner. Performed at [facility], [place] [address] [place], [place] [address]      Time coordinating discharge: 45 minutes  SIGNED:   Tawni Millers, MD  [facility] 02/13/2020,  1:02 PM

## 2020-02-13 NOTE — Progress Notes (Signed)
PT[facility] transported pt to [facility].

## 2020-02-13 NOTE — Progress Notes (Signed)
  Speech Language Pathology Treatment: Dysphagia;Cognitive-Linquistic  Patient Details Name: Brendan [name] MRN: [id number] DOB: 1961-10-14 Today's Date: 02/13/2020 Time: 9983-3825 SLP Time Calculation (min) (ACUTE ONLY): 15 min  Assessment / Plan / Recommendation Clinical Impression  Pt was seen for skilled observation of breakfast meal. His oral preparation is functional given extra time for mastication. There is no oral residue and there are no overt s/s of aspiration. He still needs a lot of encouragement to eat very little. I do not think he needs further dysphagia intervention at this point, but will adjust diet to regular textures to allow for fuller range of the menu to try to optimize intake. From a communication standpoint, pt is saying very little today even in comparison to previous visits. He does respond at times, at other times he looks at me then looks away without verbalizing. SLP provided Max cues and opportunities to increase verbal output. Min cues were provided to initiate use of callbell for pt to ask nurse for pain meds (headache). Will continue to follow for communication.    HPI HPI: This is a 59 year old nursing home resident with DM, L CVA 2009, and CAD who is chronically anticoagulated with Lovenox due to the presence of a right lower extremity DVT despite the fact that he has a IVC filter in place.  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  He came to medical attention at his [facility] because of aphasia and weakness.  A CT scan of the head has shown acute on chronic subdural hematoma, now s/p evacuation 2/22.      SLP Plan  Continue with current plan of care       Recommendations  Diet recommendations: Regular;Thin liquid Liquids provided via: Cup;Straw Medication Administration: Whole meds with liquid Supervision: Staff to assist with self feeding Compensations: Slow rate;Small  sips/bites Postural Changes and/or Swallow Maneuvers: Seated upright 90 degrees                Oral Care Recommendations: Oral care BID Follow up Recommendations: Skilled Nursing facility SLP Visit Diagnosis: Dysphagia, oral phase (R13.11);Aphasia (R47.01) Plan: Continue with current plan of care       GO                 Osie Bond., M.A. [facility] Pager [phone] Office [phone]  02/13/2020, 10:29 AM

## 2020-04-16 ENCOUNTER — Other Ambulatory Visit: Payer: Self-pay | Admitting: Neurosurgery

## 2020-04-20 ENCOUNTER — Encounter (HOSPITAL_COMMUNITY): Payer: Self-pay | Admitting: Neurosurgery

## 2020-04-20 NOTE — Progress Notes (Signed)
After several unsuccessful attempts to reach pt nurse, pre-op instructions were left on DON's voice mailbox.

## 2020-04-20 NOTE — Progress Notes (Signed)
Anesthesia Chart Review: Brendan [name]   Case: [id number] Date/Time: 04/21/20 1322   Procedure: Wound revision, Scalp (N/A ) - 3C   Anesthesia type: General   Pre-op diagnosis: Open wound of scalp without complication   Location: [facility] OR ROOM 21 / [facility]   Surgeons: Ashok Pall, MD      DISCUSSION: Patient is a 59 year old male scheduled for the above procedure. He is s/p craniotomy for evacuation of SDH on 01/26/20. He is a [facility] at [facility] and [facility]   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 Coronary atherosclerosis is listed in history (from ~ 2017/2018), but otherwise not additional details known. Notes indicate that he was incarcerated prior to going to [facility].    - Admission 01/22/20-02/13/20 for left SDH and RLE DVT. Presented from SNF with worsening left sided weakness and aphasia. Head CT showed large left SDH. He was on Lovenox for DVT (Xarelto had been stopped due to gross hematuria; vascular surgery unable to place IVC filter on 01/13/20 due to IVC occlusion), so he was given protamine. Follow-up head CT showed further enlargement of SDH, so he underwent craniotomy on 01/26/20. He was placed on Keppra for seizures precautions and received ST for swallowing dysfunction and eventually allow dysphagia 3 diet. (He and sister wanted to avoid PEG.).  He received PRBC for acute on chronic anemia/hematuria, which improved by discharge.  S/p attempted IVC filter 01/13/20 (due to DVT, unable to take anticoagulants due to gross hematuria); however inability to place a inferior vena cava filter as the infrarenal IVC was occluded. Currently on Heparin injections for  DVT history. This afternoon (04/20/20), Dr. Lacy Duverney advised holding Heparin injections for the rest of today and on the day of surgery.    He tolerated craniotomy in February 2021.  He is a same-day work-up, so anesthesia team to evaluate on the day of surgery.   VS:  BP Readings from Last 3 Encounters:  02/13/20 137/85  01/14/20 112/80  01/13/20 120/79   Pulse Readings from Last 3 Encounters:  02/13/20 91  01/14/20 92  01/13/20 82    PROVIDERS: Cleda Mccreedy, MD is listed as PCP Ulice Bold, MBBS is nephrologist ([place], see [facility] Everywhere)   LABS: He is for labs on the day of surgery. As of 02/12/20, Cr 2.16, H/H 9.3/30.7 which were consistent with trends. (Cr in the 2 range since at least 02/2017)   IMAGES: CT Head 02/02/20 (follow-up SDH, s/p craniotomy 01/26/20): IMPRESSION: 1. Slight decrease in size of residual mixed density extra-axial hemorrhage over the left convexity. 2. Stable to slightly improved midline shift. 3. No new hemorrhage. 4. Stable left-sided white matter infarcts. 5. Chronic sinus disease.   EKG: 01/22/20 (in setting of SDH): Sinus tachycardia at 120 bp, Abnormal R-wave progression, early transition No significant change since last tracing Confirmed by Blanchie Dessert [phone]) on 01/23/2020 6:14:39 PM   CV: "Cardiac catheterization" on 01/13/20 is actually a attempted IVC filter placement.  IVC Venography/IVUS/LE venography 01/13/20: Impression:  Inability to place a inferior vena cava filter as the infrarenal IVC was occluded.   BLE venous US 01/24/20: Summary:  RIGHT:  - Findings consistent with age indeterminate deep vein thrombosis  involving the right  common femoral vein, and SF junction.  - Findings appear improved from previous examination.  - Prior study from 01/05/20 showed age indeterminate DVT in the right  proximal external iliac vein and the common femoral vein.  LEFT:  - There is no  evidence of deep vein thrombosis in the lower extremity.   RLE venous US 01/05/20: Summary:  Right: Findings consistent with age indeterminate deep vein thrombosis  involving the right common femoral vein and distal external iliac vein.    Past Medical History:  Diagnosis Date  . Acidosis   . Acute unilateral obstructive uropathy   . Allergic rhinitis   . Chronic kidney disease, stage IV (severe) ([place])   . Constipation   . Coronary atherosclerosis of native coronary artery   . Diabetes mellitus without complication ([place])   . DVT (deep venous thrombosis) ([place])   . Enlarged prostate with urinary obstruction   . Essential hypertension, malignant   . GERD (gastroesophageal reflux disease)   . Glaucoma   . Hemiparesis affecting right side as late effect of cerebrovascular accident ([place])   . Hemiplegia ([place])   . Hydronephrosis   . Hyperlipidemia   . Hypertension   . Hypokalemia   . Muscle weakness (generalized)   . Open wound of scalp without complication   . PVD (peripheral vascular disease) ([place])   . Retroperitoneal fibrosis   . Stricture or kinking of ureter   . Stroke [facility])    right sided hemiparesis  . Vitamin D deficiency     Past Surgical History:  Procedure Laterality Date  . CRANIOTOMY Left 01/26/2020   Procedure: CRANIOTOMY HEMATOMA EVACUATION SUBDURAL;  Surgeon: Ashok Pall, MD;  Location: [place];  Service: Neurosurgery;  Laterality: Left;  . INTRAVASCULAR ULTRASOUND/IVUS N/A 01/13/2020   Procedure: Intravascular Ultrasound/IVUS;  Surgeon: Serafina Mitchell, MD;  Location: [place] [facility];  Service: Cardiovascular;  Laterality: N/A;  IJ, SVC, IVC  . IR GENERIC HISTORICAL  11/09/2016   IR NEPHROSTOMY EXCHANGE LEFT 11/09/2016 Corrie Mckusick, DO [facility]-INTERV RAD  . IR GENERIC HISTORICAL  01/09/2017   IR NEPHROSTOMY EXCHANGE LEFT 01/09/2017 [name], MD [facility]-INTERV RAD  . IR NEPHROSTOGRAM LEFT THRU EXISTING ACCESS  11/12/2019  . IR NEPHROSTOMY EXCHANGE LEFT  03/06/2017  . IR  NEPHROSTOMY EXCHANGE LEFT  04/13/2017  . IR NEPHROSTOMY EXCHANGE LEFT  06/08/2017  . IR NEPHROSTOMY EXCHANGE LEFT  07/20/2017  . IR NEPHROSTOMY EXCHANGE LEFT  08/03/2017  . IR NEPHROSTOMY EXCHANGE LEFT  09/28/2017  . IR NEPHROSTOMY EXCHANGE LEFT  11/29/2017  . IR NEPHROSTOMY EXCHANGE LEFT  01/31/2018  . IR NEPHROSTOMY EXCHANGE LEFT  04/04/2018  . IR NEPHROSTOMY EXCHANGE LEFT  06/27/2018  . IR NEPHROSTOMY EXCHANGE LEFT  09/19/2018  . IR NEPHROSTOMY EXCHANGE LEFT  12/10/2018  . IR NEPHROSTOMY EXCHANGE LEFT  06/26/2019  . IR NEPHROSTOMY EXCHANGE LEFT  10/16/2019  . IR NEPHROSTOMY EXCHANGE LEFT  01/26/2020  . IVC VENOGRAPHY N/A 01/13/2020   Procedure: IVC Venography;  Surgeon: Serafina Mitchell, MD;  Location: [place] [facility];  Service: Cardiovascular;  Laterality: N/A;  . LOWER EXTREMITY VENOGRAPHY Left 01/13/2020   Procedure: LOWER EXTREMITY VENOGRAPHY;  Surgeon: Serafina Mitchell, MD;  Location: [place] [facility];  Service: Cardiovascular;  Laterality: Left;  . NEPHROSTOMY TUBE PLACEMENT ([place] HX)      MEDICATIONS: No current facility-administered medications for this encounter.   Marland Kitchen acetaminophen (TYLENOL) 325 MG tablet  . Amino Acids-Protein Hydrolys (FEEDING SUPPLEMENT, PRO-STAT SUGAR FREE 64,) LIQD  .  calcium carbonate (CALTRATE 600) 1500 (600 Ca) MG TABS tablet  . cetaphil (CETAPHIL) lotion  . Cholecalciferol 1000 units tablet  . ezetimibe (ZETIA) 10 MG tablet  . famotidine (PEPCID) 20 MG tablet  . guaiFENesin (MUCINEX) 600 MG 12 hr tablet  . heparin 5000 UNIT/ML injection  . loratadine (CLARITIN) 10 MG tablet  . mycophenolate (CELLCEPT) 500 MG tablet  . polyethylene glycol (MIRALAX / GLYCOLAX) 17 g packet  . senna (SENOKOT) 8.6 MG TABS tablet  . simvastatin (ZOCOR) 40 MG tablet  . sodium bicarbonate 650 MG tablet  . tamsulosin (FLOMAX) 0.4 MG CAPS capsule    Myra Gianotti, PA-C Surgical Short Stay/Anesthesiology [facility] Phone [phone] [facility] Phone [phone] 04/20/2020 3:24  PM

## 2020-04-20 NOTE — Pre-Procedure Instructions (Signed)
Brendan [name]  04/20/2020     No Pharmacies Listed   Your procedure is scheduled on Wednesday, Apr 21, 2020  Report to [facility] Admitting at 10:30 A.M.  Call this number if you have problems the morning of surgery:  [phone]   Remember: Brush your teeth the morning of surgery with your regular toothpaste.   Do not eat or drink after midnight .    Take these medicines the morning of surgery with A SIP OF WATER:   loratadine (CLARITIN)  mycophenolate (CELLCEPT)   sodium bicarbonate  If needed: acetaminophen (TYLENOL) for moderate pain or headache.  Stop taking Aspirin (unless otherwise advised by surgeon), vitamins, fish oil and herbal medications. Do not take any NSAIDs ie: Ibuprofen, Advil, Naproxen (Aleve), Motrin, BC and Goody Powder; stop now.      How to Manage Your Diabetes Before and After Surgery  Why is it important to control my blood sugar before and after surgery? . Improving blood sugar levels before and after surgery helps healing and can limit problems. . A way of improving blood sugar control is eating a healthy diet by: o  Eating less sugar and carbohydrates o  Increasing activity/exercise o  Talking with your doctor about reaching your blood sugar goals . High blood sugars (greater than 180 mg/dL) can raise your risk of infections and slow your recovery, so you will need to focus on controlling your diabetes during the weeks before surgery. . Make sure that the doctor who takes care of your diabetes knows about your planned surgery including the date and location.  How do I manage my blood sugar before surgery? . Check your blood sugar at least 4 times a day, starting 2 days before surgery, to make sure that the level is not too high or low. o Check your blood sugar the morning of your surgery when you wake up and every 2 hours until you get to the Short Stay unit. . If your blood sugar is less than 70 mg/dL, you will need to treat for low  blood sugar: o Treat a low blood sugar (less than 70 mg/dL) with  cup of clear juice (cranberry or apple), 4 glucose tablets, OR glucose gel. Recheck blood sugar in 15 minutes after treatment (to make sure it is greater than 70 mg/dL). If your blood sugar is not greater than 70 mg/dL on recheck, call [phone] o  for further instructions. . Report your blood sugar to the short stay nurse when you get to Short Stay.  . If you are admitted to the hospital after surgery: o Your blood sugar will be checked by the staff and you will probably be given insulin after surgery (instead of oral diabetes medicines) to make sure you have good blood sugar levels. o The goal for blood sugar control after surgery is 80-180 mg/dL.   WHAT DO I DO ABOUT MY DIABETES MEDICATION? N/A  Reviewed and Endorsed by [facility] Patient Education Committee, August 2015  Do not wear jewelry  Do not wear lotions, powders, or perfumes, or deodorant.  Do not shave 48 hours prior to surgery.  Men may shave face and neck.  Do not bring valuables to the hospital.  [facility] is not responsible for any belongings or valuables.  Contacts, dentures or bridgework may not be worn into surgery.  Leave your suitcase in the car.  After surgery it may be brought to your room.  For patients admitted to the  hospital, discharge time will be determined by your treatment team.  Patients discharged the day of surgery will not be allowed to drive home.   Special instructions: Shower with antibacterial soap to prepare for surgery.  Please read over the following fact sheets that you were given.

## 2020-04-20 NOTE — Progress Notes (Signed)
Genevie Ann, LPN, form [employer], called  to confirm receipt of fax. Nurse verbalized understanding of all pre-op instructions.

## 2020-04-21 ENCOUNTER — Ambulatory Visit (HOSPITAL_COMMUNITY): Payer: Medicaid Other | Admitting: Vascular Surgery

## 2020-04-21 ENCOUNTER — Encounter (HOSPITAL_COMMUNITY)
Admission: RE | Disposition: A | Discharge status: Skilled Nursing Facility | Payer: Self-pay | Source: Home / Self Care | Attending: Neurosurgery

## 2020-04-21 ENCOUNTER — Ambulatory Visit (HOSPITAL_COMMUNITY): Payer: Medicaid Other

## 2020-04-21 ENCOUNTER — Encounter (HOSPITAL_COMMUNITY): Payer: Self-pay | Admitting: Neurosurgery

## 2020-04-21 ENCOUNTER — Other Ambulatory Visit: Payer: Self-pay

## 2020-04-21 ENCOUNTER — Ambulatory Visit (HOSPITAL_COMMUNITY)
Admission: RE | Admit: 2020-04-21 | Discharge: 2020-04-21 | Disposition: A | Discharge status: Skilled Nursing Facility | Payer: Medicaid Other | Attending: Neurosurgery | Admitting: Neurosurgery

## 2020-04-21 DIAGNOSIS — E1122 Type 2 diabetes mellitus with diabetic chronic kidney disease: Secondary | ICD-10-CM | POA: Diagnosis not present

## 2020-04-21 DIAGNOSIS — E785 Hyperlipidemia, unspecified: Secondary | ICD-10-CM | POA: Insufficient documentation

## 2020-04-21 DIAGNOSIS — Z86718 Personal history of other venous thrombosis and embolism: Secondary | ICD-10-CM | POA: Insufficient documentation

## 2020-04-21 DIAGNOSIS — I69351 Hemiplegia and hemiparesis following cerebral infarction affecting right dominant side: Secondary | ICD-10-CM | POA: Diagnosis not present

## 2020-04-21 DIAGNOSIS — N400 Enlarged prostate without lower urinary tract symptoms: Secondary | ICD-10-CM | POA: Diagnosis not present

## 2020-04-21 DIAGNOSIS — E1151 Type 2 diabetes mellitus with diabetic peripheral angiopathy without gangrene: Secondary | ICD-10-CM | POA: Diagnosis not present

## 2020-04-21 DIAGNOSIS — N184 Chronic kidney disease, stage 4 (severe): Secondary | ICD-10-CM | POA: Insufficient documentation

## 2020-04-21 DIAGNOSIS — T8189XA Other complications of procedures, not elsewhere classified, initial encounter: Secondary | ICD-10-CM | POA: Diagnosis present

## 2020-04-21 DIAGNOSIS — Z79899 Other long term (current) drug therapy: Secondary | ICD-10-CM | POA: Diagnosis not present

## 2020-04-21 DIAGNOSIS — I878 Other specified disorders of veins: Secondary | ICD-10-CM

## 2020-04-21 DIAGNOSIS — Z20822 Contact with and (suspected) exposure to covid-19: Secondary | ICD-10-CM | POA: Insufficient documentation

## 2020-04-21 DIAGNOSIS — I129 Hypertensive chronic kidney disease with stage 1 through stage 4 chronic kidney disease, or unspecified chronic kidney disease: Secondary | ICD-10-CM | POA: Insufficient documentation

## 2020-04-21 DIAGNOSIS — K219 Gastro-esophageal reflux disease without esophagitis: Secondary | ICD-10-CM | POA: Diagnosis not present

## 2020-04-21 DIAGNOSIS — Y838 Other surgical procedures as the cause of abnormal reaction of the patient, or of later complication, without mention of misadventure at the time of the procedure: Secondary | ICD-10-CM | POA: Insufficient documentation

## 2020-04-21 DIAGNOSIS — I2510 Atherosclerotic heart disease of native coronary artery without angina pectoris: Secondary | ICD-10-CM | POA: Insufficient documentation

## 2020-04-21 HISTORY — DX: Unspecified open wound of scalp, initial encounter: S01.00XA

## 2020-04-21 HISTORY — PX: WOUND EXPLORATION: SHX6188

## 2020-04-21 LAB — CBC
HCT: 36 % — ABNORMAL LOW (ref 39.0–52.0)
Hemoglobin: 10.6 g/dL — ABNORMAL LOW (ref 13.0–17.0)
MCH: 24.4 pg — ABNORMAL LOW (ref 26.0–34.0)
MCHC: 29.4 g/dL — ABNORMAL LOW (ref 30.0–36.0)
MCV: 82.8 fL (ref 80.0–100.0)
Platelets: 219 10*3/uL (ref 150–400)
RBC: 4.35 MIL/uL (ref 4.22–5.81)
RDW: 16.8 % — ABNORMAL HIGH (ref 11.5–15.5)
WBC: 6.3 10*3/uL (ref 4.0–10.5)
nRBC: 0 % (ref 0.0–0.2)

## 2020-04-21 LAB — POCT I-STAT, CHEM 8
BUN: 44 mg/dL — ABNORMAL HIGH (ref 6–20)
Calcium, Ion: 1.33 mmol/L (ref 1.15–1.40)
Chloride: 106 mmol/L (ref 98–111)
Creatinine, Ser: 3.2 mg/dL — ABNORMAL HIGH (ref 0.61–1.24)
Glucose, Bld: 94 mg/dL (ref 70–99)
HCT: 33 % — ABNORMAL LOW (ref 39.0–52.0)
Hemoglobin: 11.2 g/dL — ABNORMAL LOW (ref 13.0–17.0)
Potassium: 4.7 mmol/L (ref 3.5–5.1)
Sodium: 142 mmol/L (ref 135–145)
TCO2: 28 mmol/L (ref 22–32)

## 2020-04-21 LAB — GLUCOSE, CAPILLARY
Glucose-Capillary: 82 mg/dL (ref 70–99)
Glucose-Capillary: 93 mg/dL (ref 70–99)

## 2020-04-21 LAB — BASIC METABOLIC PANEL
Anion gap: 10 (ref 5–15)
BUN: 52 mg/dL — ABNORMAL HIGH (ref 6–20)
CO2: 24 mmol/L (ref 22–32)
Calcium: 9.2 mg/dL (ref 8.9–10.3)
Chloride: 105 mmol/L (ref 98–111)
Creatinine, Ser: 2.93 mg/dL — ABNORMAL HIGH (ref 0.61–1.24)
GFR calc Af Amer: 26 mL/min — ABNORMAL LOW (ref >60)
GFR calc non Af Amer: 22 mL/min — ABNORMAL LOW (ref >60)
Glucose, Bld: 96 mg/dL (ref 70–99)
Potassium: 4.5 mmol/L (ref 3.5–5.1)
Sodium: 139 mmol/L (ref 135–145)

## 2020-04-21 LAB — SARS CORONAVIRUS 2 BY RT PCR (HOSPITAL ORDER, PERFORMED IN ~~LOC~~ HOSPITAL LAB): SARS Coronavirus 2: NEGATIVE

## 2020-04-21 LAB — SURGICAL PCR SCREEN
MRSA, PCR: POSITIVE — AB
Staphylococcus aureus: POSITIVE — AB

## 2020-04-21 SURGERY — WOUND EXPLORATION
Anesthesia: General | Site: Head

## 2020-04-21 MED ORDER — PHENYLEPHRINE HCL-NACL 10-0.9 MG/250ML-% IV SOLN
INTRAVENOUS | Status: DC | PRN
Start: 2020-04-21 — End: 2020-04-21
  Administered 2020-04-21: 50 ug/min via INTRAVENOUS

## 2020-04-21 MED ORDER — FENTANYL CITRATE (PF) 100 MCG/2ML IJ SOLN
INTRAMUSCULAR | Status: DC | PRN
  Administered 2020-04-21: 25 ug via INTRAVENOUS
  Administered 2020-04-21 (×2): 50 ug via INTRAVENOUS
  Administered 2020-04-21: 25 ug via INTRAVENOUS
  Administered 2020-04-21: 50 ug via INTRAVENOUS

## 2020-04-21 MED ORDER — BACITRACIN ZINC 500 UNIT/GM EX OINT
TOPICAL_OINTMENT | CUTANEOUS | Status: DC | PRN
  Administered 2020-04-21: 1 via TOPICAL

## 2020-04-21 MED ORDER — CEFAZOLIN SODIUM-DEXTROSE 2-4 GM/100ML-% IV SOLN
2.0000 g | INTRAVENOUS | Status: AC
  Administered 2020-04-21: 2 g via INTRAVENOUS
  Filled 2020-04-21: qty 100

## 2020-04-21 MED ORDER — LIDOCAINE 2% (20 MG/ML) 5 ML SYRINGE
INTRAMUSCULAR | Status: AC
  Filled 2020-04-21: qty 5

## 2020-04-21 MED ORDER — 0.9 % SODIUM CHLORIDE (POUR BTL) OPTIME
TOPICAL | Status: DC | PRN
  Administered 2020-04-21: 1000 mL

## 2020-04-21 MED ORDER — PROPOFOL 10 MG/ML IV BOLUS
INTRAVENOUS | Status: DC | PRN
  Administered 2020-04-21: 150 mg via INTRAVENOUS

## 2020-04-21 MED ORDER — OXYCODONE HCL 5 MG PO TABS
ORAL_TABLET | ORAL | Status: AC
  Administered 2020-04-21: 5 mg via ORAL
  Filled 2020-04-21: qty 1

## 2020-04-21 MED ORDER — VANCOMYCIN HCL 1000 MG IV SOLR
INTRAVENOUS | Status: AC
  Filled 2020-04-21: qty 1000

## 2020-04-21 MED ORDER — IBUPROFEN 800 MG PO TABS: 800.0000 mg | ORAL_TABLET | Freq: Three times a day (TID) | ORAL | 0 refills | Status: DC | PRN

## 2020-04-21 MED ORDER — CHLORHEXIDINE GLUCONATE CLOTH 2 % EX PADS: 6.0000 | MEDICATED_PAD | Freq: Once | CUTANEOUS | Status: DC

## 2020-04-21 MED ORDER — ONDANSETRON HCL 4 MG/2ML IJ SOLN: 4.0000 mg | Freq: Once | INTRAMUSCULAR | Status: DC | PRN

## 2020-04-21 MED ORDER — OXYCODONE HCL 5 MG PO TABS: 5.0000 mg | ORAL_TABLET | Freq: Once | ORAL | Status: AC

## 2020-04-21 MED ORDER — SODIUM CHLORIDE 0.9 % IV SOLN: INTRAVENOUS | Status: DC | PRN

## 2020-04-21 MED ORDER — MIDAZOLAM HCL 2 MG/2ML IJ SOLN
INTRAMUSCULAR | Status: AC
  Filled 2020-04-21: qty 2

## 2020-04-21 MED ORDER — FENTANYL CITRATE (PF) 100 MCG/2ML IJ SOLN: 25.0000 ug | INTRAMUSCULAR | Status: DC | PRN

## 2020-04-21 MED ORDER — PROPOFOL 10 MG/ML IV BOLUS
INTRAVENOUS | Status: AC
  Filled 2020-04-21: qty 20

## 2020-04-21 MED ORDER — ROCURONIUM BROMIDE 10 MG/ML (PF) SYRINGE
PREFILLED_SYRINGE | INTRAVENOUS | Status: AC
  Filled 2020-04-21: qty 10

## 2020-04-21 MED ORDER — LIDOCAINE 2% (20 MG/ML) 5 ML SYRINGE
INTRAMUSCULAR | Status: DC | PRN
  Administered 2020-04-21: 60 mg via INTRAVENOUS

## 2020-04-21 MED ORDER — FENTANYL CITRATE (PF) 100 MCG/2ML IJ SOLN
INTRAMUSCULAR | Status: AC
  Administered 2020-04-21: 50 ug via INTRAVENOUS
  Filled 2020-04-21: qty 2

## 2020-04-21 MED ORDER — ONDANSETRON HCL 4 MG/2ML IJ SOLN
INTRAMUSCULAR | Status: DC | PRN
  Administered 2020-04-21: 4 mg via INTRAVENOUS

## 2020-04-21 MED ORDER — FENTANYL CITRATE (PF) 250 MCG/5ML IJ SOLN
INTRAMUSCULAR | Status: AC
  Filled 2020-04-21: qty 5

## 2020-04-21 MED ORDER — PHENYLEPHRINE HCL (PRESSORS) 10 MG/ML IV SOLN
INTRAVENOUS | Status: DC | PRN
Start: 2020-04-21 — End: 2020-04-21
  Administered 2020-04-21: 120 ug via INTRAVENOUS

## 2020-04-21 MED ORDER — DEXAMETHASONE SODIUM PHOSPHATE 4 MG/ML IJ SOLN
INTRAMUSCULAR | Status: DC | PRN
  Administered 2020-04-21: 4 mg via INTRAVENOUS

## 2020-04-21 MED ORDER — BACITRACIN ZINC 500 UNIT/GM EX OINT
TOPICAL_OINTMENT | CUTANEOUS | Status: AC
  Filled 2020-04-21: qty 28.35

## 2020-04-21 SURGICAL SUPPLY — 47 items
BAG DECANTER FOR FLEXI CONT (MISCELLANEOUS) ×1 IMPLANT
BENZOIN TINCTURE PRP APPL 2/3 (GAUZE/BANDAGES/DRESSINGS) ×1 IMPLANT
BLADE CLIPPER SURG (BLADE) IMPLANT
CANISTER SUCT 3000ML PPV (MISCELLANEOUS) ×3 IMPLANT
CARTRIDGE OIL MAESTRO DRILL (MISCELLANEOUS) ×1 IMPLANT
CLOSURE WOUND 1/2 X4 (GAUZE/BANDAGES/DRESSINGS)
COVER WAND RF STERILE (DRAPES) ×1 IMPLANT
DIFFUSER DRILL AIR PNEUMATIC (MISCELLANEOUS) ×1 IMPLANT
DRAPE NEUROLOGICAL W/INCISE (DRAPES) ×2 IMPLANT
DRAPE SURG 17X23 STRL (DRAPES) ×4 IMPLANT
DRSG TELFA 3X8 NADH (GAUZE/BANDAGES/DRESSINGS) ×3 IMPLANT
ELECT REM PT RETURN 9FT ADLT (ELECTROSURGICAL) ×3
ELECTRODE REM PT RTRN 9FT ADLT (ELECTROSURGICAL) ×1 IMPLANT
GAUZE 4X4 16PLY RFD (DISPOSABLE) IMPLANT
GAUZE SPONGE 4X4 12PLY STRL (GAUZE/BANDAGES/DRESSINGS) ×1 IMPLANT
GLOVE BIO SURGEON STRL SZ 6.5 (GLOVE) ×1 IMPLANT
GLOVE BIO SURGEONS STRL SZ 6.5 (GLOVE) ×1
GLOVE BIOGEL PI IND STRL 6.5 (GLOVE) IMPLANT
GLOVE BIOGEL PI INDICATOR 6.5 (GLOVE) ×2
GLOVE ECLIPSE 6.5 STRL STRAW (GLOVE) ×3 IMPLANT
GLOVE EXAM NITRILE XL STR (GLOVE) IMPLANT
GOWN STRL REUS W/ TWL LRG LVL3 (GOWN DISPOSABLE) IMPLANT
GOWN STRL REUS W/ TWL XL LVL3 (GOWN DISPOSABLE) IMPLANT
GOWN STRL REUS W/TWL LRG LVL3 (GOWN DISPOSABLE) ×4
GOWN STRL REUS W/TWL XL LVL3 (GOWN DISPOSABLE)
KIT BASIN OR (CUSTOM PROCEDURE TRAY) ×3 IMPLANT
KIT TURNOVER KIT B (KITS) ×3 IMPLANT
NEEDLE HYPO 22GX1.5 SAFETY (NEEDLE) ×2 IMPLANT
NS IRRIG 1000ML POUR BTL (IV SOLUTION) ×3 IMPLANT
OIL CARTRIDGE MAESTRO DRILL (MISCELLANEOUS)
PACK LAMINECTOMY NEURO (CUSTOM PROCEDURE TRAY) ×3 IMPLANT
PAD ARMBOARD 7.5X6 YLW CONV (MISCELLANEOUS) ×9 IMPLANT
PAD DRESSING TELFA 3X8 NADH (GAUZE/BANDAGES/DRESSINGS) IMPLANT
STAPLER VISISTAT 35W (STAPLE) ×2 IMPLANT
STRIP CLOSURE SKIN 1/2X4 (GAUZE/BANDAGES/DRESSINGS) ×1 IMPLANT
SUT ETHILON 3 0 FSL (SUTURE) ×2 IMPLANT
SUT PROLENE 0 CT 1 30 (SUTURE) ×2 IMPLANT
SUT VIC AB 1 CT1 18XBRD ANBCTR (SUTURE) ×1 IMPLANT
SUT VIC AB 1 CT1 8-18 (SUTURE)
SUT VIC AB 2-0 CP2 18 (SUTURE) ×5 IMPLANT
SWAB COLLECTION DEVICE MRSA (MISCELLANEOUS) IMPLANT
SWAB CULTURE ESWAB REG 1ML (MISCELLANEOUS) IMPLANT
TOWEL GREEN STERILE (TOWEL DISPOSABLE) ×3 IMPLANT
TOWEL GREEN STERILE FF (TOWEL DISPOSABLE) ×3 IMPLANT
TUBE CONNECTING 12'X1/4 (SUCTIONS) ×1
TUBE CONNECTING 12X1/4 (SUCTIONS) ×1 IMPLANT
WATER STERILE IRR 1000ML POUR (IV SOLUTION) ×3 IMPLANT

## 2020-04-21 NOTE — Progress Notes (Signed)
Right IJ double Lumen catheter D/c'd. Patient place in a supine position; sutures removed; cath removed while holding breath; pressure applied for ten minutes; vasoline guaze placed. No complications noted.

## 2020-04-21 NOTE — Op Note (Signed)
04/21/2020  2:37 PM  PATIENT:  Brendan [name]  59 y.o. male  PRE-OPERATIVE DIAGNOSIS:  Open wound of scalp without complication  POST-OPERATIVE DIAGNOSIS:  Open wound of scalp without complication  PROCEDURE:  Procedure(s): Wound revision, Scalp  SURGEON: Surgeon(s): Ashok Pall, MD  ASSISTANTS:none  ANESTHESIA:   general  EBL:  Total I/O In: 100 [IV Piggyback:100] Out: -   BLOOD ADMINISTERED:none  CELL SAVER GIVEN:none  COUNT:correct  DRAINS: none   SPECIMEN:  Source of Specimen:  scalp  DICTATION: Brendan [name] was taken to the operating room, intubated, and placed under a general anesthetic with an LMA without difficulty. He  was positioned supine with his head on a gel doughnut. I shaved his head, and his scalpwas prepped and draped in a sterile manner. I used a snap to open the wound where it had not healed. I made an elliptical incision around the opening. I removed the scar. I irrigated and inspected the galea and skull. No indications of a wound infection or skull infection. I closed the incision with a prolene. I placed bacitracin ointment on the wound, and telfa to cover it. The LMA was removed and he was at baseline.   PLAN OF CARE: Discharge to home after PACU  PATIENT DISPOSITION:  PACU - hemodynamically stable.   Delay start of Pharmacological VTE agent (>24hrs) due to surgical blood loss or risk of bleeding:  no

## 2020-04-21 NOTE — Transfer of Care (Signed)
Immediate Anesthesia Transfer of Care Note  Patient: Brendan [name]  Procedure(s) Performed: Wound revision, Scalp (N/A Head)  Patient Location: PACU  Anesthesia Type:General  Level of Consciousness: awake, alert  and oriented  Airway & Oxygen Therapy: Patient Spontanous Breathing  Post-op Assessment: Report given to RN and Post -op Vital signs reviewed and stable  Post vital signs: Reviewed and stable  Last Vitals:  Vitals Value Taken Time  BP 137/98 04/21/20 1433  Temp    Pulse 93 04/21/20 1436  Resp 10 04/21/20 1436  SpO2 100 % 04/21/20 1436  Vitals shown include unvalidated device data.  Last Pain:  Vitals:   04/21/20 1138  TempSrc:   PainSc: 0-No pain         Complications: No apparent anesthesia complications

## 2020-04-21 NOTE — Anesthesia Procedure Notes (Signed)
Central Venous Catheter Insertion Performed by: Roberts Gaudy, MD, anesthesiologist Start/End5/19/2021 1:10 PM, 04/21/2020 1:15 PM Patient location: Pre-op. Preanesthetic checklist: patient identified, IV checked, site marked, risks and benefits discussed, surgical consent, monitors and equipment checked, pre-op evaluation, timeout performed and anesthesia consent Lidocaine 1% used for infiltration and patient sedated Hand hygiene performed  and maximum sterile barriers used  Catheter size: 8 Fr Total catheter length 16. Central line was placed.Double lumen Procedure performed using ultrasound guided technique. Ultrasound Notes:image(s) printed for medical record Attempts: 1 Following insertion, dressing applied and line sutured. Post procedure assessment: blood return through all ports  Patient tolerated the procedure well with no immediate complications.

## 2020-04-21 NOTE — Anesthesia Procedure Notes (Signed)
Procedure Name: LMA Insertion Date/Time: 04/21/2020 1:48 PM Performed by: Amadeo Garnet, CRNA Pre-anesthesia Checklist: Patient identified, Emergency Drugs available, Suction available and Patient being monitored Patient Re-evaluated:Patient Re-evaluated prior to induction Oxygen Delivery Method: Circle system utilized Preoxygenation: Pre-oxygenation with 100% oxygen Induction Type: IV induction Ventilation: Mask ventilation without difficulty LMA: LMA inserted LMA Size: 5.0 Placement Confirmation: positive ETCO2 and breath sounds checked- equal and bilateral Tube secured with: Tape Dental Injury: Teeth and Oropharynx as per pre-operative assessment

## 2020-04-21 NOTE — H&P (Signed)
There were no vitals taken for this visit. Brendan [name] is admitted for a non healing scalp wound. He has been treated conservatively and the wound has not healed. There is no evidence of infection. I have recommended that he be admitted for a local wound revision.  Allergies  Allergen Reactions  . Other     TB serum-reaction unknown. Provided via [facility] records    Past Medical History:  Diagnosis Date  . Acidosis   . Acute unilateral obstructive uropathy   . Allergic rhinitis   . Chronic kidney disease, stage IV (severe) ([place])   . Constipation   . Coronary atherosclerosis of native coronary artery   . Diabetes mellitus without complication ([place])   . DVT (deep venous thrombosis) ([place])   . Enlarged prostate with urinary obstruction   . Essential hypertension, malignant   . GERD (gastroesophageal reflux disease)   . Glaucoma   . Hemiparesis affecting right side as late effect of cerebrovascular accident ([place])   . Hemiplegia ([place])   . Hydronephrosis   . Hyperlipidemia   . Hypertension   . Hypokalemia   . Muscle weakness (generalized)   . Open wound of scalp without complication   . PVD (peripheral vascular disease) ([place])   . Retroperitoneal fibrosis   . Stricture or kinking of ureter   . Stroke [facility])    right sided hemiparesis  . Vitamin D deficiency    Past Surgical History:  Procedure Laterality Date  . CRANIOTOMY Left 01/26/2020   Procedure: CRANIOTOMY HEMATOMA EVACUATION SUBDURAL;  Surgeon: Ashok Pall, MD;  Location: [place];  Service: Neurosurgery;  Laterality: Left;  . INTRAVASCULAR ULTRASOUND/IVUS N/A 01/13/2020   Procedure: Intravascular Ultrasound/IVUS;  Surgeon: Serafina Mitchell, MD;  Location: [place] [facility];  Service: Cardiovascular;  Laterality: N/A;  IJ, SVC, IVC  . IR GENERIC HISTORICAL  11/09/2016   IR NEPHROSTOMY EXCHANGE LEFT 11/09/2016 Corrie Mckusick, DO [facility]-INTERV RAD  . IR GENERIC HISTORICAL  01/09/2017   IR NEPHROSTOMY EXCHANGE LEFT 01/09/2017 [name], MD  [facility]-INTERV RAD  . IR NEPHROSTOGRAM LEFT THRU EXISTING ACCESS  11/12/2019  . IR NEPHROSTOMY EXCHANGE LEFT  03/06/2017  . IR NEPHROSTOMY EXCHANGE LEFT  04/13/2017  . IR NEPHROSTOMY EXCHANGE LEFT  06/08/2017  . IR NEPHROSTOMY EXCHANGE LEFT  07/20/2017  . IR NEPHROSTOMY EXCHANGE LEFT  08/03/2017  . IR NEPHROSTOMY EXCHANGE LEFT  09/28/2017  . IR NEPHROSTOMY EXCHANGE LEFT  11/29/2017  . IR NEPHROSTOMY EXCHANGE LEFT  01/31/2018  . IR NEPHROSTOMY EXCHANGE LEFT  04/04/2018  . IR NEPHROSTOMY EXCHANGE LEFT  06/27/2018  . IR NEPHROSTOMY EXCHANGE LEFT  09/19/2018  . IR NEPHROSTOMY EXCHANGE LEFT  12/10/2018  . IR NEPHROSTOMY EXCHANGE LEFT  06/26/2019  . IR NEPHROSTOMY EXCHANGE LEFT  10/16/2019  . IR NEPHROSTOMY EXCHANGE LEFT  01/26/2020  . IVC VENOGRAPHY N/A 01/13/2020   Procedure: IVC Venography;  Surgeon: Serafina Mitchell, MD;  Location: [place] [facility];  Service: Cardiovascular;  Laterality: N/A;  . LOWER EXTREMITY VENOGRAPHY Left 01/13/2020   Procedure: LOWER EXTREMITY VENOGRAPHY;  Surgeon: Serafina Mitchell, MD;  Location: [place] [facility];  Service: Cardiovascular;  Laterality: Left;  . NEPHROSTOMY TUBE PLACEMENT ([place] HX)     Family History  Family history unknown: Yes   Social History   Socioeconomic History  . Marital status: Single    Spouse name: Not on file  . Number of children: Not on file  . Years of education: Not on file  . Highest education level:  Not on file  Occupational History  . Not on file  Tobacco Use  . Smoking status: Never Smoker  . Smokeless tobacco: Never Used  Substance and Sexual Activity  . Alcohol use: Not Currently  . Drug use: Not Currently  . Sexual activity: Not Currently  Other Topics Concern  . Not on file  Social History Narrative  . Not on file   Social Determinants of Health   Financial Resource Strain:   . Difficulty of Paying Living Expenses:   Food Insecurity:   . Worried About Charity fundraiser in the Last Year:   . Arboriculturist in the Last  Year:   Transportation Needs:   . Film/video editor (Medical):   Marland Kitchen Lack of Transportation (Non-Medical):   Physical Activity:   . Days of Exercise per Week:   . Minutes of Exercise per Session:   Stress:   . Feeling of Stress :   Social Connections:   . Frequency of Communication with Friends and Family:   . Frequency of Social Gatherings with Friends and Family:   . Attends Religious Services:   . Active Member of Clubs or Organizations:   . Attends Archivist Meetings:   Marland Kitchen Marital Status:   Intimate Partner Violence:   . Fear of Current or Ex-Partner:   . Emotionally Abused:   Marland Kitchen Physically Abused:   . Sexually Abused:    Physical Exam Constitutional:      Appearance: Normal appearance. He is obese.  HENT:     Head:     Comments: Non healing scalp wound    Right Ear: Tympanic membrane, ear canal and external ear normal.     Left Ear: Tympanic membrane, ear canal and external ear normal.     Nose: Nose normal.     Mouth/Throat:     Mouth: Mucous membranes are dry.     Pharynx: Oropharynx is clear.  Eyes:     Extraocular Movements: Extraocular movements intact.     Conjunctiva/sclera: Conjunctivae normal.     Pupils: Pupils are equal, round, and reactive to light.  Cardiovascular:     Rate and Rhythm: Normal rate and regular rhythm.  Pulmonary:     Effort: Pulmonary effort is normal.     Breath sounds: Normal breath sounds.  Abdominal:     General: Abdomen is flat.     Palpations: Abdomen is soft.  Musculoskeletal:     Cervical back: Normal range of motion.  Skin:    General: Skin is warm and dry.  Neurological:     Mental Status: He is alert and oriented to person, place, and time.     Motor: Weakness present.     Coordination: Coordination abnormal.     Gait: Gait abnormal.     Deep Tendon Reflexes: Reflexes normal.  Psychiatric:        Behavior: Behavior normal.        Thought Content: Thought content normal.        Judgment: Judgment  normal.   admit for wound revision. Risks including but not limited too infection, bleeding, need for further surgery, continued wound problems were discussed. He understands and wishes to proceed.

## 2020-04-22 ENCOUNTER — Encounter: Payer: Self-pay | Admitting: *Deleted

## 2020-04-22 NOTE — Anesthesia Preprocedure Evaluation (Addendum)
Anesthesia Evaluation  Patient identified by MRN, date of birth, ID band Patient awake    Reviewed: Allergy & Precautions, NPO status , Patient's Chart, lab work & pertinent test results  Airway Mallampati: II  TM Distance: >3 FB Neck ROM: Full    Dental  (+) Poor Dentition   Pulmonary    breath sounds clear to auscultation       Cardiovascular hypertension,  Rhythm:Regular Rate:Normal     Neuro/Psych    GI/Hepatic   Endo/Other  diabetes  Renal/GU      Musculoskeletal   Abdominal   Peds  Hematology   Anesthesia Other Findings Patient lethargic, but follows commands  Reproductive/Obstetrics                             Anesthesia Physical Anesthesia Plan  ASA: III  Anesthesia Plan: General   Post-op Pain Management:    Induction: Intravenous  PONV Risk Score and Plan: Ondansetron and Dexamethasone  Airway Management Planned: Oral ETT  Additional Equipment: CVP  Intra-op Plan:   Post-operative Plan: Extubation in OR  Informed Consent: I have reviewed the patients History and Physical, chart, labs and discussed the procedure including the risks, benefits and alternatives for the proposed anesthesia with the patient or authorized representative who has indicated his/her understanding and acceptance.     Dental advisory given  Plan Discussed with: CRNA and Anesthesiologist  Anesthesia Plan Comments:         Anesthesia Quick Evaluation

## 2020-04-22 NOTE — Anesthesia Postprocedure Evaluation (Signed)
Anesthesia Post Note  Patient: Brendan [name]  Procedure(s) Performed: Wound revision, Scalp (N/A Head)     Patient location during evaluation: PACU Anesthesia Type: General Level of consciousness: awake and alert Pain management: pain level controlled Vital Signs Assessment: post-procedure vital signs reviewed and stable Respiratory status: spontaneous breathing, nonlabored ventilation, respiratory function stable and patient connected to nasal cannula oxygen Cardiovascular status: blood pressure returned to baseline and stable Postop Assessment: no apparent nausea or vomiting Anesthetic complications: no    Last Vitals:  Vitals:   04/21/20 1525 04/21/20 1550  BP: (!) 145/87 (!) 135/94  Pulse: 86 86  Resp: 11 13  Temp:  36.6 C  SpO2: 97% 99%    Last Pain:  Vitals:   04/21/20 1550  TempSrc:   PainSc: 4                  Thaila Bottoms COKER

## 2020-05-27 ENCOUNTER — Other Ambulatory Visit (HOSPITAL_COMMUNITY): Payer: Self-pay | Admitting: Interventional Radiology

## 2020-05-27 ENCOUNTER — Ambulatory Visit (HOSPITAL_COMMUNITY)
Admission: RE | Admit: 2020-05-27 | Discharge: 2020-05-27 | Disposition: A | Discharge status: Home / Self Care | Payer: Medicaid Other | Source: Ambulatory Visit | Attending: Interventional Radiology | Admitting: Interventional Radiology

## 2020-05-27 ENCOUNTER — Other Ambulatory Visit: Payer: Self-pay

## 2020-05-27 DIAGNOSIS — N1330 Unspecified hydronephrosis: Secondary | ICD-10-CM

## 2020-05-27 DIAGNOSIS — Z436 Encounter for attention to other artificial openings of urinary tract: Secondary | ICD-10-CM | POA: Insufficient documentation

## 2020-05-27 HISTORY — PX: IR NEPHROSTOMY EXCHANGE LEFT: IMG6069

## 2020-05-27 MED ORDER — LIDOCAINE HCL 1 % IJ SOLN
INTRAMUSCULAR | Status: AC
  Filled 2020-05-27: qty 20

## 2020-05-27 MED ORDER — IOHEXOL 300 MG/ML  SOLN
50.0000 mL | Freq: Once | INTRAMUSCULAR | Status: AC | PRN
  Administered 2020-05-27: 8 mL

## 2020-05-27 NOTE — Procedures (Signed)
Interventional Radiology Procedure Note  Procedure: Exchange of left PCN drain.  New 77F drain.  Complications: None Recommendations:  - To gravity - Routine care  Signed,  Dulcy Fanny. Earleen Newport, DO

## 2020-08-19 ENCOUNTER — Other Ambulatory Visit (HOSPITAL_COMMUNITY): Payer: Self-pay | Admitting: Interventional Radiology

## 2020-08-19 ENCOUNTER — Other Ambulatory Visit: Payer: Self-pay

## 2020-08-19 ENCOUNTER — Ambulatory Visit (HOSPITAL_COMMUNITY)
Admission: RE | Admit: 2020-08-19 | Discharge: 2020-08-19 | Disposition: A | Discharge status: Home / Self Care | Payer: Medicaid Other | Source: Ambulatory Visit | Attending: Interventional Radiology | Admitting: Interventional Radiology

## 2020-08-19 DIAGNOSIS — Z436 Encounter for attention to other artificial openings of urinary tract: Secondary | ICD-10-CM | POA: Diagnosis not present

## 2020-08-19 DIAGNOSIS — N1330 Unspecified hydronephrosis: Secondary | ICD-10-CM | POA: Diagnosis not present

## 2020-08-19 HISTORY — PX: IR NEPHROSTOMY EXCHANGE LEFT: IMG6069

## 2020-08-19 MED ORDER — IOHEXOL 300 MG/ML  SOLN
50.0000 mL | Freq: Once | INTRAMUSCULAR | Status: AC | PRN
  Administered 2020-08-19: 8 mL

## 2020-08-19 MED ORDER — LIDOCAINE HCL 1 % IJ SOLN
INTRAMUSCULAR | Status: AC
  Filled 2020-08-19: qty 20

## 2020-10-29 ENCOUNTER — Inpatient Hospital Stay (HOSPITAL_COMMUNITY)
Admission: EM | Admit: 2020-10-29 | Discharge: 2020-11-16 | DRG: 698 | Disposition: A | Discharge status: Skilled Nursing Facility | Payer: Medicaid Other | Source: Skilled Nursing Facility | Attending: Internal Medicine | Admitting: Internal Medicine

## 2020-10-29 ENCOUNTER — Other Ambulatory Visit: Payer: Self-pay

## 2020-10-29 ENCOUNTER — Emergency Department (HOSPITAL_COMMUNITY): Payer: Medicaid Other

## 2020-10-29 ENCOUNTER — Encounter (HOSPITAL_COMMUNITY): Payer: Self-pay | Admitting: Emergency Medicine

## 2020-10-29 DIAGNOSIS — Z9109 Other allergy status, other than to drugs and biological substances: Secondary | ICD-10-CM

## 2020-10-29 DIAGNOSIS — R34 Anuria and oliguria: Secondary | ICD-10-CM | POA: Diagnosis not present

## 2020-10-29 DIAGNOSIS — E8770 Fluid overload, unspecified: Secondary | ICD-10-CM | POA: Diagnosis not present

## 2020-10-29 DIAGNOSIS — D6489 Other specified anemias: Secondary | ICD-10-CM | POA: Diagnosis not present

## 2020-10-29 DIAGNOSIS — Z79899 Other long term (current) drug therapy: Secondary | ICD-10-CM

## 2020-10-29 DIAGNOSIS — Y732 Prosthetic and other implants, materials and accessory gastroenterology and urology devices associated with adverse incidents: Secondary | ICD-10-CM | POA: Diagnosis present

## 2020-10-29 DIAGNOSIS — N170 Acute kidney failure with tubular necrosis: Secondary | ICD-10-CM | POA: Diagnosis present

## 2020-10-29 DIAGNOSIS — Z6833 Body mass index (BMI) 33.0-33.9, adult: Secondary | ICD-10-CM

## 2020-10-29 DIAGNOSIS — E876 Hypokalemia: Secondary | ICD-10-CM | POA: Diagnosis not present

## 2020-10-29 DIAGNOSIS — A4181 Sepsis due to Enterococcus: Secondary | ICD-10-CM | POA: Diagnosis present

## 2020-10-29 DIAGNOSIS — R6521 Severe sepsis with septic shock: Secondary | ICD-10-CM | POA: Diagnosis present

## 2020-10-29 DIAGNOSIS — N131 Hydronephrosis with ureteral stricture, not elsewhere classified: Secondary | ICD-10-CM | POA: Diagnosis present

## 2020-10-29 DIAGNOSIS — A419 Sepsis, unspecified organism: Secondary | ICD-10-CM

## 2020-10-29 DIAGNOSIS — I2510 Atherosclerotic heart disease of native coronary artery without angina pectoris: Secondary | ICD-10-CM | POA: Diagnosis present

## 2020-10-29 DIAGNOSIS — J309 Allergic rhinitis, unspecified: Secondary | ICD-10-CM | POA: Diagnosis present

## 2020-10-29 DIAGNOSIS — Z20822 Contact with and (suspected) exposure to covid-19: Secondary | ICD-10-CM | POA: Diagnosis present

## 2020-10-29 DIAGNOSIS — H409 Unspecified glaucoma: Secondary | ICD-10-CM | POA: Diagnosis present

## 2020-10-29 DIAGNOSIS — B952 Enterococcus as the cause of diseases classified elsewhere: Secondary | ICD-10-CM

## 2020-10-29 DIAGNOSIS — E1122 Type 2 diabetes mellitus with diabetic chronic kidney disease: Secondary | ICD-10-CM | POA: Diagnosis present

## 2020-10-29 DIAGNOSIS — E872 Acidosis: Secondary | ICD-10-CM | POA: Diagnosis present

## 2020-10-29 DIAGNOSIS — E871 Hypo-osmolality and hyponatremia: Secondary | ICD-10-CM | POA: Diagnosis not present

## 2020-10-29 DIAGNOSIS — Z7401 Bed confinement status: Secondary | ICD-10-CM

## 2020-10-29 DIAGNOSIS — E1151 Type 2 diabetes mellitus with diabetic peripheral angiopathy without gangrene: Secondary | ICD-10-CM | POA: Diagnosis present

## 2020-10-29 DIAGNOSIS — Z86718 Personal history of other venous thrombosis and embolism: Secondary | ICD-10-CM

## 2020-10-29 DIAGNOSIS — D631 Anemia in chronic kidney disease: Secondary | ICD-10-CM | POA: Diagnosis present

## 2020-10-29 DIAGNOSIS — K219 Gastro-esophageal reflux disease without esophagitis: Secondary | ICD-10-CM | POA: Diagnosis present

## 2020-10-29 DIAGNOSIS — T83022A Displacement of nephrostomy catheter, initial encounter: Principal | ICD-10-CM | POA: Diagnosis present

## 2020-10-29 DIAGNOSIS — N135 Crossing vessel and stricture of ureter without hydronephrosis: Secondary | ICD-10-CM

## 2020-10-29 DIAGNOSIS — E785 Hyperlipidemia, unspecified: Secondary | ICD-10-CM | POA: Diagnosis present

## 2020-10-29 DIAGNOSIS — S37012A Minor contusion of left kidney, initial encounter: Secondary | ICD-10-CM | POA: Diagnosis present

## 2020-10-29 DIAGNOSIS — N184 Chronic kidney disease, stage 4 (severe): Secondary | ICD-10-CM | POA: Diagnosis present

## 2020-10-29 DIAGNOSIS — I69351 Hemiplegia and hemiparesis following cerebral infarction affecting right dominant side: Secondary | ICD-10-CM

## 2020-10-29 DIAGNOSIS — J9600 Acute respiratory failure, unspecified whether with hypoxia or hypercapnia: Secondary | ICD-10-CM

## 2020-10-29 DIAGNOSIS — E669 Obesity, unspecified: Secondary | ICD-10-CM | POA: Diagnosis present

## 2020-10-29 DIAGNOSIS — R7881 Bacteremia: Secondary | ICD-10-CM

## 2020-10-29 DIAGNOSIS — E875 Hyperkalemia: Secondary | ICD-10-CM | POA: Diagnosis present

## 2020-10-29 DIAGNOSIS — I129 Hypertensive chronic kidney disease with stage 1 through stage 4 chronic kidney disease, or unspecified chronic kidney disease: Secondary | ICD-10-CM | POA: Diagnosis present

## 2020-10-29 DIAGNOSIS — K661 Hemoperitoneum: Secondary | ICD-10-CM | POA: Diagnosis present

## 2020-10-29 DIAGNOSIS — Z452 Encounter for adjustment and management of vascular access device: Secondary | ICD-10-CM

## 2020-10-29 DIAGNOSIS — G9341 Metabolic encephalopathy: Secondary | ICD-10-CM | POA: Diagnosis present

## 2020-10-29 DIAGNOSIS — D696 Thrombocytopenia, unspecified: Secondary | ICD-10-CM | POA: Diagnosis present

## 2020-10-29 DIAGNOSIS — J9601 Acute respiratory failure with hypoxia: Secondary | ICD-10-CM | POA: Diagnosis present

## 2020-10-29 DIAGNOSIS — R569 Unspecified convulsions: Secondary | ICD-10-CM | POA: Diagnosis not present

## 2020-10-29 DIAGNOSIS — D62 Acute posthemorrhagic anemia: Secondary | ICD-10-CM | POA: Diagnosis not present

## 2020-10-29 DIAGNOSIS — N179 Acute kidney failure, unspecified: Secondary | ICD-10-CM

## 2020-10-29 DIAGNOSIS — Z8619 Personal history of other infectious and parasitic diseases: Secondary | ICD-10-CM

## 2020-10-29 DIAGNOSIS — E1165 Type 2 diabetes mellitus with hyperglycemia: Secondary | ICD-10-CM | POA: Diagnosis present

## 2020-10-29 DIAGNOSIS — Z539 Procedure and treatment not carried out, unspecified reason: Secondary | ICD-10-CM | POA: Diagnosis not present

## 2020-10-29 DIAGNOSIS — N261 Atrophy of kidney (terminal): Secondary | ICD-10-CM | POA: Diagnosis present

## 2020-10-29 DIAGNOSIS — N400 Enlarged prostate without lower urinary tract symptoms: Secondary | ICD-10-CM | POA: Diagnosis present

## 2020-10-29 DIAGNOSIS — D72819 Decreased white blood cell count, unspecified: Secondary | ICD-10-CM

## 2020-10-29 DIAGNOSIS — N3001 Acute cystitis with hematuria: Secondary | ICD-10-CM | POA: Diagnosis present

## 2020-10-29 DIAGNOSIS — E860 Dehydration: Secondary | ICD-10-CM | POA: Diagnosis present

## 2020-10-29 DIAGNOSIS — I714 Abdominal aortic aneurysm, without rupture: Secondary | ICD-10-CM | POA: Diagnosis present

## 2020-10-29 DIAGNOSIS — N1330 Unspecified hydronephrosis: Secondary | ICD-10-CM

## 2020-10-29 MED ORDER — SODIUM CHLORIDE 0.9 % IV SOLN
2.0000 g | Freq: Once | INTRAVENOUS | Status: AC
  Administered 2020-10-30: 2 g via INTRAVENOUS
  Filled 2020-10-29: qty 2

## 2020-10-29 MED ORDER — LACTATED RINGERS IV BOLUS (SEPSIS)
1000.0000 mL | Freq: Once | INTRAVENOUS | Status: AC
  Administered 2020-10-30: 1000 mL via INTRAVENOUS

## 2020-10-29 MED ORDER — LACTATED RINGERS IV SOLN: INTRAVENOUS | Status: AC

## 2020-10-29 MED ORDER — LACTATED RINGERS IV BOLUS (SEPSIS)
500.0000 mL | Freq: Once | INTRAVENOUS | Status: AC
  Administered 2020-10-30: 500 mL via INTRAVENOUS

## 2020-10-29 NOTE — ED Triage Notes (Addendum)
Pt from [facility] with N/V, fever, and blood in nephrostomy tube that started today.

## 2020-10-30 ENCOUNTER — Inpatient Hospital Stay (HOSPITAL_COMMUNITY): Payer: Medicaid Other

## 2020-10-30 ENCOUNTER — Encounter (HOSPITAL_COMMUNITY): Payer: Self-pay | Admitting: Internal Medicine

## 2020-10-30 ENCOUNTER — Emergency Department (HOSPITAL_COMMUNITY): Payer: Medicaid Other

## 2020-10-30 DIAGNOSIS — N3001 Acute cystitis with hematuria: Secondary | ICD-10-CM | POA: Diagnosis present

## 2020-10-30 DIAGNOSIS — E1165 Type 2 diabetes mellitus with hyperglycemia: Secondary | ICD-10-CM | POA: Diagnosis not present

## 2020-10-30 DIAGNOSIS — E1151 Type 2 diabetes mellitus with diabetic peripheral angiopathy without gangrene: Secondary | ICD-10-CM | POA: Diagnosis not present

## 2020-10-30 DIAGNOSIS — N179 Acute kidney failure, unspecified: Secondary | ICD-10-CM

## 2020-10-30 DIAGNOSIS — J309 Allergic rhinitis, unspecified: Secondary | ICD-10-CM | POA: Diagnosis not present

## 2020-10-30 DIAGNOSIS — R6521 Severe sepsis with septic shock: Secondary | ICD-10-CM | POA: Diagnosis present

## 2020-10-30 DIAGNOSIS — T85528A Displacement of other gastrointestinal prosthetic devices, implants and grafts, initial encounter: Secondary | ICD-10-CM | POA: Diagnosis not present

## 2020-10-30 DIAGNOSIS — D62 Acute posthemorrhagic anemia: Secondary | ICD-10-CM | POA: Diagnosis not present

## 2020-10-30 DIAGNOSIS — J9601 Acute respiratory failure with hypoxia: Secondary | ICD-10-CM | POA: Diagnosis present

## 2020-10-30 DIAGNOSIS — S37012A Minor contusion of left kidney, initial encounter: Secondary | ICD-10-CM | POA: Diagnosis present

## 2020-10-30 DIAGNOSIS — T83022A Displacement of nephrostomy catheter, initial encounter: Secondary | ICD-10-CM | POA: Diagnosis present

## 2020-10-30 DIAGNOSIS — I38 Endocarditis, valve unspecified: Secondary | ICD-10-CM | POA: Diagnosis not present

## 2020-10-30 DIAGNOSIS — Z20822 Contact with and (suspected) exposure to covid-19: Secondary | ICD-10-CM | POA: Diagnosis present

## 2020-10-30 DIAGNOSIS — D631 Anemia in chronic kidney disease: Secondary | ICD-10-CM | POA: Diagnosis not present

## 2020-10-30 DIAGNOSIS — B952 Enterococcus as the cause of diseases classified elsewhere: Secondary | ICD-10-CM | POA: Diagnosis not present

## 2020-10-30 DIAGNOSIS — R7881 Bacteremia: Secondary | ICD-10-CM | POA: Diagnosis not present

## 2020-10-30 DIAGNOSIS — K661 Hemoperitoneum: Secondary | ICD-10-CM | POA: Diagnosis present

## 2020-10-30 DIAGNOSIS — R4182 Altered mental status, unspecified: Secondary | ICD-10-CM | POA: Diagnosis not present

## 2020-10-30 DIAGNOSIS — E860 Dehydration: Secondary | ICD-10-CM | POA: Diagnosis present

## 2020-10-30 DIAGNOSIS — E872 Acidosis: Secondary | ICD-10-CM | POA: Diagnosis not present

## 2020-10-30 DIAGNOSIS — N131 Hydronephrosis with ureteral stricture, not elsewhere classified: Secondary | ICD-10-CM | POA: Diagnosis not present

## 2020-10-30 DIAGNOSIS — Y732 Prosthetic and other implants, materials and accessory gastroenterology and urology devices associated with adverse incidents: Secondary | ICD-10-CM | POA: Diagnosis not present

## 2020-10-30 DIAGNOSIS — G9341 Metabolic encephalopathy: Secondary | ICD-10-CM | POA: Diagnosis present

## 2020-10-30 DIAGNOSIS — N170 Acute kidney failure with tubular necrosis: Secondary | ICD-10-CM | POA: Diagnosis present

## 2020-10-30 DIAGNOSIS — N184 Chronic kidney disease, stage 4 (severe): Secondary | ICD-10-CM | POA: Diagnosis present

## 2020-10-30 DIAGNOSIS — E1122 Type 2 diabetes mellitus with diabetic chronic kidney disease: Secondary | ICD-10-CM | POA: Diagnosis not present

## 2020-10-30 DIAGNOSIS — I2510 Atherosclerotic heart disease of native coronary artery without angina pectoris: Secondary | ICD-10-CM | POA: Diagnosis not present

## 2020-10-30 DIAGNOSIS — I69351 Hemiplegia and hemiparesis following cerebral infarction affecting right dominant side: Secondary | ICD-10-CM | POA: Diagnosis not present

## 2020-10-30 DIAGNOSIS — A419 Sepsis, unspecified organism: Secondary | ICD-10-CM | POA: Diagnosis present

## 2020-10-30 DIAGNOSIS — I330 Acute and subacute infective endocarditis: Secondary | ICD-10-CM | POA: Diagnosis not present

## 2020-10-30 DIAGNOSIS — I129 Hypertensive chronic kidney disease with stage 1 through stage 4 chronic kidney disease, or unspecified chronic kidney disease: Secondary | ICD-10-CM | POA: Diagnosis not present

## 2020-10-30 DIAGNOSIS — A4181 Sepsis due to Enterococcus: Secondary | ICD-10-CM | POA: Diagnosis present

## 2020-10-30 DIAGNOSIS — N400 Enlarged prostate without lower urinary tract symptoms: Secondary | ICD-10-CM | POA: Diagnosis not present

## 2020-10-30 DIAGNOSIS — I10 Essential (primary) hypertension: Secondary | ICD-10-CM | POA: Diagnosis not present

## 2020-10-30 DIAGNOSIS — E871 Hypo-osmolality and hyponatremia: Secondary | ICD-10-CM | POA: Diagnosis not present

## 2020-10-30 HISTORY — PX: IR NEPHROSTOMY PLACEMENT LEFT: IMG6063

## 2020-10-30 LAB — URINALYSIS, ROUTINE W REFLEX MICROSCOPIC
Bilirubin Urine: NEGATIVE
Glucose, UA: NEGATIVE mg/dL
Ketones, ur: NEGATIVE mg/dL
Nitrite: NEGATIVE
Protein, ur: >=300 mg/dL — AB
RBC / HPF: >50 RBC/hpf — ABNORMAL HIGH (ref 0–5)
Specific Gravity, Urine: 1.023 (ref 1.005–1.030)
pH: 7 (ref 5.0–8.0)

## 2020-10-30 LAB — BLOOD GAS, ARTERIAL
Acid-base deficit: 7.6 mmol/L — ABNORMAL HIGH (ref 0.0–2.0)
Bicarbonate: 18.6 mmol/L — ABNORMAL LOW (ref 20.0–28.0)
FIO2: 100
O2 Saturation: 99.7 %
Patient temperature: 40
pCO2 arterial: 32.2 mmHg (ref 32.0–48.0)
pH, Arterial: 7.346 — ABNORMAL LOW (ref 7.350–7.450)
pO2, Arterial: 420 mmHg — ABNORMAL HIGH (ref 83.0–108.0)

## 2020-10-30 LAB — COMPREHENSIVE METABOLIC PANEL
ALT: 28 U/L (ref 0–44)
ALT: 32 U/L (ref 0–44)
AST: 28 U/L (ref 15–41)
AST: 55 U/L — ABNORMAL HIGH (ref 15–41)
Albumin: 2.1 g/dL — ABNORMAL LOW (ref 3.5–5.0)
Albumin: 3.5 g/dL (ref 3.5–5.0)
Alkaline Phosphatase: 49 U/L (ref 38–126)
Alkaline Phosphatase: 91 U/L (ref 38–126)
Anion gap: 11 (ref 5–15)
Anion gap: 18 — ABNORMAL HIGH (ref 5–15)
BUN: 31 mg/dL — ABNORMAL HIGH (ref 8–23)
BUN: 31 mg/dL — ABNORMAL HIGH (ref 8–23)
CO2: 16 mmol/L — ABNORMAL LOW (ref 22–32)
CO2: 23 mmol/L (ref 22–32)
Calcium: 7.8 mg/dL — ABNORMAL LOW (ref 8.9–10.3)
Calcium: 8.9 mg/dL (ref 8.9–10.3)
Chloride: 105 mmol/L (ref 98–111)
Chloride: 106 mmol/L (ref 98–111)
Creatinine, Ser: 3.65 mg/dL — ABNORMAL HIGH (ref 0.61–1.24)
Creatinine, Ser: 4.57 mg/dL — ABNORMAL HIGH (ref 0.61–1.24)
GFR, Estimated: 14 mL/min — ABNORMAL LOW (ref >60)
GFR, Estimated: 18 mL/min — ABNORMAL LOW (ref >60)
Glucose, Bld: 122 mg/dL — ABNORMAL HIGH (ref 70–99)
Glucose, Bld: 165 mg/dL — ABNORMAL HIGH (ref 70–99)
Potassium: 4.3 mmol/L (ref 3.5–5.1)
Potassium: 4.8 mmol/L (ref 3.5–5.1)
Sodium: 139 mmol/L (ref 135–145)
Sodium: 140 mmol/L (ref 135–145)
Total Bilirubin: 0.3 mg/dL (ref 0.3–1.2)
Total Bilirubin: 0.8 mg/dL (ref 0.3–1.2)
Total Protein: 4.7 g/dL — ABNORMAL LOW (ref 6.5–8.1)
Total Protein: 7.6 g/dL (ref 6.5–8.1)

## 2020-10-30 LAB — CBC WITH DIFFERENTIAL/PLATELET
Abs Immature Granulocytes: 0.01 10*3/uL (ref 0.00–0.07)
Basophils Absolute: 0 10*3/uL (ref 0.0–0.1)
Basophils Relative: 1 %
Eosinophils Absolute: 0 10*3/uL (ref 0.0–0.5)
Eosinophils Relative: 1 %
HCT: 43.9 % (ref 39.0–52.0)
Hemoglobin: 12.9 g/dL — ABNORMAL LOW (ref 13.0–17.0)
Immature Granulocytes: 1 %
Lymphocytes Relative: 16 %
Lymphs Abs: 0.3 10*3/uL — ABNORMAL LOW (ref 0.7–4.0)
MCH: 26 pg (ref 26.0–34.0)
MCHC: 29.4 g/dL — ABNORMAL LOW (ref 30.0–36.0)
MCV: 88.5 fL (ref 80.0–100.0)
Monocytes Absolute: 0 10*3/uL — ABNORMAL LOW (ref 0.1–1.0)
Monocytes Relative: 1 %
Neutro Abs: 1.4 10*3/uL — ABNORMAL LOW (ref 1.7–7.7)
Neutrophils Relative %: 80 %
Platelets: 173 10*3/uL (ref 150–400)
RBC: 4.96 MIL/uL (ref 4.22–5.81)
RDW: 17.3 % — ABNORMAL HIGH (ref 11.5–15.5)
WBC: 1.7 10*3/uL — ABNORMAL LOW (ref 4.0–10.5)
nRBC: 1.7 % — ABNORMAL HIGH (ref 0.0–0.2)

## 2020-10-30 LAB — BASIC METABOLIC PANEL
Anion gap: 18 — ABNORMAL HIGH (ref 5–15)
BUN: 35 mg/dL — ABNORMAL HIGH (ref 8–23)
CO2: 14 mmol/L — ABNORMAL LOW (ref 22–32)
Calcium: 7.6 mg/dL — ABNORMAL LOW (ref 8.9–10.3)
Chloride: 104 mmol/L (ref 98–111)
Creatinine, Ser: 4.98 mg/dL — ABNORMAL HIGH (ref 0.61–1.24)
GFR, Estimated: 12 mL/min — ABNORMAL LOW (ref >60)
Glucose, Bld: 205 mg/dL — ABNORMAL HIGH (ref 70–99)
Potassium: 6.5 mmol/L (ref 3.5–5.1)
Sodium: 136 mmol/L (ref 135–145)

## 2020-10-30 LAB — POCT I-STAT 7, (LYTES, BLD GAS, ICA,H+H)
Acid-base deficit: 8 mmol/L — ABNORMAL HIGH (ref 0.0–2.0)
Bicarbonate: 16.2 mmol/L — ABNORMAL LOW (ref 20.0–28.0)
Calcium, Ion: 1.09 mmol/L — ABNORMAL LOW (ref 1.15–1.40)
HCT: 22 % — ABNORMAL LOW (ref 39.0–52.0)
Hemoglobin: 7.5 g/dL — ABNORMAL LOW (ref 13.0–17.0)
O2 Saturation: 99 %
Patient temperature: 99.1
Potassium: 5 mmol/L (ref 3.5–5.1)
Sodium: 141 mmol/L (ref 135–145)
TCO2: 17 mmol/L — ABNORMAL LOW (ref 22–32)
pCO2 arterial: 27.4 mmHg — ABNORMAL LOW (ref 32.0–48.0)
pH, Arterial: 7.38 (ref 7.350–7.450)
pO2, Arterial: 139 mmHg — ABNORMAL HIGH (ref 83.0–108.0)

## 2020-10-30 LAB — LACTIC ACID, PLASMA
Lactic Acid, Venous: 4.4 mmol/L (ref 0.5–1.9)
Lactic Acid, Venous: 7.8 mmol/L (ref 0.5–1.9)
Lactic Acid, Venous: >11 mmol/L (ref 0.5–1.9)

## 2020-10-30 LAB — CBC
HCT: 24.5 % — ABNORMAL LOW (ref 39.0–52.0)
HCT: 27 % — ABNORMAL LOW (ref 39.0–52.0)
Hemoglobin: 7.3 g/dL — ABNORMAL LOW (ref 13.0–17.0)
Hemoglobin: 8.4 g/dL — ABNORMAL LOW (ref 13.0–17.0)
MCH: 26.2 pg (ref 26.0–34.0)
MCH: 26.4 pg (ref 26.0–34.0)
MCHC: 29.8 g/dL — ABNORMAL LOW (ref 30.0–36.0)
MCHC: 31.1 g/dL (ref 30.0–36.0)
MCV: 87.8 fL (ref 80.0–100.0)
MCV: 84.9 fL (ref 80.0–100.0)
Platelets: 158 10*3/uL (ref 150–400)
Platelets: 118 10*3/uL — ABNORMAL LOW (ref 150–400)
RBC: 2.79 MIL/uL — ABNORMAL LOW (ref 4.22–5.81)
RBC: 3.18 MIL/uL — ABNORMAL LOW (ref 4.22–5.81)
RDW: 17.2 % — ABNORMAL HIGH (ref 11.5–15.5)
RDW: 17.2 % — ABNORMAL HIGH (ref 11.5–15.5)
WBC: 21.1 10*3/uL — ABNORMAL HIGH (ref 4.0–10.5)
WBC: 29.8 10*3/uL — ABNORMAL HIGH (ref 4.0–10.5)
nRBC: 1.8 % — ABNORMAL HIGH (ref 0.0–0.2)
nRBC: 0.3 % — ABNORMAL HIGH (ref 0.0–0.2)

## 2020-10-30 LAB — APTT
aPTT: 32 seconds (ref 24–36)
aPTT: 39 seconds — ABNORMAL HIGH (ref 24–36)

## 2020-10-30 LAB — RESP PANEL BY RT-PCR (FLU A&B, COVID) ARPGX2
Influenza A by PCR: NEGATIVE
Influenza B by PCR: NEGATIVE
SARS Coronavirus 2 by RT PCR: NEGATIVE

## 2020-10-30 LAB — PROTIME-INR
INR: 1.1 (ref 0.8–1.2)
INR: 1.6 — ABNORMAL HIGH (ref 0.8–1.2)
Prothrombin Time: 14.1 seconds (ref 11.4–15.2)
Prothrombin Time: 18.4 seconds — ABNORMAL HIGH (ref 11.4–15.2)

## 2020-10-30 LAB — GLUCOSE, CAPILLARY
Glucose-Capillary: 176 mg/dL — ABNORMAL HIGH (ref 70–99)
Glucose-Capillary: 328 mg/dL — ABNORMAL HIGH (ref 70–99)

## 2020-10-30 LAB — TRIGLYCERIDES: Triglycerides: 247 mg/dL — ABNORMAL HIGH (ref <150)

## 2020-10-30 LAB — CORTISOL: Cortisol, Plasma: 72.3 ug/dL

## 2020-10-30 LAB — LIPASE, BLOOD: Lipase: 26 U/L (ref 11–51)

## 2020-10-30 LAB — PROCALCITONIN: Procalcitonin: >150 ng/mL

## 2020-10-30 LAB — PREPARE RBC (CROSSMATCH)

## 2020-10-30 LAB — MRSA PCR SCREENING: MRSA by PCR: NEGATIVE

## 2020-10-30 MED ORDER — LIDOCAINE HCL 1 % IJ SOLN
INTRAMUSCULAR | Status: AC
  Filled 2020-10-30: qty 20

## 2020-10-30 MED ORDER — ORAL CARE MOUTH RINSE
15.0000 mL | OROMUCOSAL | Status: DC
  Administered 2020-10-30 – 2020-11-04 (×50): 15 mL via OROMUCOSAL

## 2020-10-30 MED ORDER — SODIUM CHLORIDE 0.9 % IV SOLN: 250.0000 mL | INTRAVENOUS | Status: DC

## 2020-10-30 MED ORDER — MIDAZOLAM HCL 2 MG/2ML IJ SOLN
INTRAMUSCULAR | Status: DC | PRN
  Administered 2020-10-30: 0.5 mg via INTRAVENOUS

## 2020-10-30 MED ORDER — IOHEXOL 300 MG/ML  SOLN
80.0000 mL | Freq: Once | INTRAMUSCULAR | Status: AC | PRN
  Administered 2020-10-30: 80 mL via INTRAVENOUS

## 2020-10-30 MED ORDER — FENTANYL BOLUS VIA INFUSION
50.0000 ug | INTRAVENOUS | Status: DC | PRN
  Administered 2020-11-01: 50 ug via INTRAVENOUS
  Filled 2020-10-30: qty 50

## 2020-10-30 MED ORDER — VANCOMYCIN VARIABLE DOSE PER UNSTABLE RENAL FUNCTION (PHARMACIST DOSING): Status: DC

## 2020-10-30 MED ORDER — LORAZEPAM 2 MG/ML IJ SOLN
0.5000 mg | Freq: Once | INTRAMUSCULAR | Status: AC
  Administered 2020-10-30: 0.5 mg via INTRAVENOUS
  Filled 2020-10-30: qty 1

## 2020-10-30 MED ORDER — PRISMASOL BGK 0/2.5 32-2.5 MEQ/L EC SOLN
Status: DC
  Filled 2020-10-30 (×20): qty 5000

## 2020-10-30 MED ORDER — SODIUM ZIRCONIUM CYCLOSILICATE 5 G PO PACK
10.0000 g | PACK | Freq: Three times a day (TID) | ORAL | Status: DC
  Administered 2020-10-30: 10 g
  Filled 2020-10-30: qty 2

## 2020-10-30 MED ORDER — CIPROFLOXACIN IN D5W 400 MG/200ML IV SOLN
INTRAVENOUS | Status: AC
  Administered 2020-10-30: 2 mg
  Filled 2020-10-30: qty 200

## 2020-10-30 MED ORDER — CHLORHEXIDINE GLUCONATE 0.12% ORAL RINSE (MEDLINE KIT): 15.0000 mL | Freq: Two times a day (BID) | OROMUCOSAL | Status: DC

## 2020-10-30 MED ORDER — IOHEXOL 300 MG/ML  SOLN
50.0000 mL | Freq: Once | INTRAMUSCULAR | Status: AC | PRN
  Administered 2020-10-30: 35 mL

## 2020-10-30 MED ORDER — HEPARIN SODIUM (PORCINE) 1000 UNIT/ML DIALYSIS
1000.0000 [IU] | INTRAMUSCULAR | Status: DC | PRN
  Administered 2020-10-30: 3800 [IU] via INTRAVENOUS_CENTRAL
  Administered 2020-11-02: 2000 [IU] via INTRAVENOUS_CENTRAL
  Administered 2020-11-03: 3400 [IU] via INTRAVENOUS_CENTRAL
  Administered 2020-11-05: 3000 [IU] via INTRAVENOUS_CENTRAL
  Filled 2020-10-30: qty 6
  Filled 2020-10-30: qty 4
  Filled 2020-10-30: qty 5
  Filled 2020-10-30 (×5): qty 6
  Filled 2020-10-30: qty 2
  Filled 2020-10-30: qty 3

## 2020-10-30 MED ORDER — DOCUSATE SODIUM 50 MG/5ML PO LIQD
100.0000 mg | Freq: Two times a day (BID) | ORAL | Status: DC
  Administered 2020-10-30 – 2020-11-06 (×11): 100 mg
  Filled 2020-10-30 (×13): qty 10

## 2020-10-30 MED ORDER — PRISMASOL BGK 0/2.5 32-2.5 MEQ/L EC SOLN
Status: DC
  Filled 2020-10-30 (×5): qty 5000

## 2020-10-30 MED ORDER — SODIUM CHLORIDE 0.9 % IV SOLN
1.0000 g | Freq: Once | INTRAVENOUS | Status: AC
  Administered 2020-10-30: 1 g via INTRAVENOUS
  Filled 2020-10-30: qty 10

## 2020-10-30 MED ORDER — SODIUM CHLORIDE 0.9 % IV SOLN
1.0000 g | Freq: Two times a day (BID) | INTRAVENOUS | Status: DC
  Administered 2020-10-30: 1 g via INTRAVENOUS
  Filled 2020-10-30 (×2): qty 1

## 2020-10-30 MED ORDER — PHENYLEPHRINE 40 MCG/ML (10ML) SYRINGE FOR IV PUSH (FOR BLOOD PRESSURE SUPPORT)
PREFILLED_SYRINGE | INTRAVENOUS | Status: AC
  Filled 2020-10-30: qty 10

## 2020-10-30 MED ORDER — HYDROCORTISONE NA SUCCINATE PF 100 MG IJ SOLR
50.0000 mg | Freq: Four times a day (QID) | INTRAMUSCULAR | Status: DC
  Administered 2020-10-30 – 2020-11-01 (×8): 50 mg via INTRAVENOUS
  Filled 2020-10-30 (×8): qty 2

## 2020-10-30 MED ORDER — POLYETHYLENE GLYCOL 3350 17 G PO PACK
17.0000 g | PACK | Freq: Every day | ORAL | Status: DC
  Administered 2020-10-31 – 2020-11-05 (×3): 17 g
  Filled 2020-10-30 (×4): qty 1

## 2020-10-30 MED ORDER — CIPROFLOXACIN IN D5W 200 MG/100ML IV SOLN
INTRAVENOUS | Status: DC | PRN
  Administered 2020-10-30: 400 mg via INTRAVENOUS

## 2020-10-30 MED ORDER — DEXTROSE 10 % IV SOLN: Freq: Once | INTRAVENOUS | Status: AC

## 2020-10-30 MED ORDER — VASOPRESSIN 20 UNIT/ML IV SOLN
INTRAVENOUS | Status: DC
  Filled 2020-10-30 (×6): qty 100

## 2020-10-30 MED ORDER — SODIUM CHLORIDE 0.9 % IV SOLN
250.0000 mL | INTRAVENOUS | Status: DC
  Administered 2020-10-30 – 2020-11-04 (×5): 250 mL via INTRAVENOUS

## 2020-10-30 MED ORDER — IOHEXOL 300 MG/ML  SOLN: 100.0000 mL | Freq: Once | INTRAMUSCULAR | Status: DC | PRN

## 2020-10-30 MED ORDER — LACTATED RINGERS IV BOLUS
2000.0000 mL | Freq: Once | INTRAVENOUS | Status: AC
  Administered 2020-10-30: 2000 mL via INTRAVENOUS

## 2020-10-30 MED ORDER — VANCOMYCIN HCL 10 G IV SOLR
2500.0000 mg | Freq: Once | INTRAVENOUS | Status: AC
  Administered 2020-10-30: 2500 mg via INTRAVENOUS
  Filled 2020-10-30: qty 2500

## 2020-10-30 MED ORDER — NOREPINEPHRINE 4 MG/250ML-% IV SOLN
2.0000 ug/min | INTRAVENOUS | Status: DC
  Administered 2020-10-30: 35 ug/min via INTRAVENOUS
  Administered 2020-10-30: 2 ug/min via INTRAVENOUS
  Administered 2020-10-30: 35 ug/min via INTRAVENOUS
  Filled 2020-10-30 (×3): qty 250

## 2020-10-30 MED ORDER — MIDAZOLAM HCL 2 MG/2ML IJ SOLN
2.0000 mg | INTRAMUSCULAR | Status: AC | PRN
  Administered 2020-10-30 – 2020-10-31 (×3): 2 mg via INTRAVENOUS
  Filled 2020-10-30 (×3): qty 2

## 2020-10-30 MED ORDER — METOPROLOL TARTRATE 5 MG/5ML IV SOLN
INTRAVENOUS | Status: AC
  Administered 2020-10-30: 3 mg
  Filled 2020-10-30: qty 5

## 2020-10-30 MED ORDER — ALBUMIN HUMAN 25 % IV SOLN
25.0000 g | Freq: Four times a day (QID) | INTRAVENOUS | Status: AC
  Administered 2020-10-30 – 2020-10-31 (×4): 25 g via INTRAVENOUS
  Filled 2020-10-30 (×4): qty 100

## 2020-10-30 MED ORDER — PROPOFOL 1000 MG/100ML IV EMUL
5.0000 ug/kg/min | INTRAVENOUS | Status: DC
  Administered 2020-10-30: 15 ug/kg/min via INTRAVENOUS
  Filled 2020-10-30 (×2): qty 100

## 2020-10-30 MED ORDER — FENTANYL CITRATE (PF) 100 MCG/2ML IJ SOLN
50.0000 ug | Freq: Once | INTRAMUSCULAR | Status: AC
  Administered 2020-10-30: 50 ug via INTRAVENOUS

## 2020-10-30 MED ORDER — ETOMIDATE 2 MG/ML IV SOLN
30.0000 mg | Freq: Once | INTRAVENOUS | Status: AC
  Administered 2020-10-30: 30 mg via INTRAVENOUS

## 2020-10-30 MED ORDER — SODIUM CHLORIDE 0.9 % IV SOLN
1.2500 ng/kg/min | INTRAVENOUS | Status: DC
  Administered 2020-10-30: 5 ng/kg/min via INTRAVENOUS
  Filled 2020-10-30: qty 1

## 2020-10-30 MED ORDER — CHLORHEXIDINE GLUCONATE 4 % EX LIQD
CUTANEOUS | Status: AC
  Filled 2020-10-30: qty 15

## 2020-10-30 MED ORDER — PRISMASOL BGK 0/2.5 32-2.5 MEQ/L EC SOLN
Status: DC
  Filled 2020-10-30 (×3): qty 5000

## 2020-10-30 MED ORDER — ACETAMINOPHEN 650 MG RE SUPP
650.0000 mg | Freq: Once | RECTAL | Status: AC
  Administered 2020-10-30: 650 mg via RECTAL
  Filled 2020-10-30: qty 1

## 2020-10-30 MED ORDER — MIDAZOLAM HCL 2 MG/2ML IJ SOLN
2.0000 mg | INTRAMUSCULAR | Status: DC | PRN
  Administered 2020-10-31: 2 mg via INTRAVENOUS

## 2020-10-30 MED ORDER — MIDAZOLAM HCL 2 MG/2ML IJ SOLN
INTRAMUSCULAR | Status: AC
  Filled 2020-10-30: qty 2

## 2020-10-30 MED ORDER — ORAL CARE MOUTH RINSE
15.0000 mL | OROMUCOSAL | Status: DC
  Administered 2020-10-30: 15 mL via OROMUCOSAL

## 2020-10-30 MED ORDER — VASOPRESSIN 20 UNITS/100 ML INFUSION FOR SHOCK
0.0000 [IU]/min | INTRAVENOUS | Status: DC
  Filled 2020-10-30: qty 100

## 2020-10-30 MED ORDER — FENTANYL 2500MCG IN NS 250ML (10MCG/ML) PREMIX INFUSION
50.0000 ug/h | INTRAVENOUS | Status: DC
  Administered 2020-10-30: 200 ug/h via INTRAVENOUS
  Administered 2020-10-30: 100 ug/h via INTRAVENOUS
  Administered 2020-10-31: 200 ug/h via INTRAVENOUS
  Administered 2020-11-01: 50 ug/h via INTRAVENOUS
  Administered 2020-11-01: 200 ug/h via INTRAVENOUS
  Filled 2020-10-30 (×5): qty 250

## 2020-10-30 MED ORDER — DEXTROSE 50 % IV SOLN
1.0000 | Freq: Once | INTRAVENOUS | Status: AC
  Administered 2020-10-30: 50 mL via INTRAVENOUS
  Filled 2020-10-30: qty 50

## 2020-10-30 MED ORDER — NOREPINEPHRINE 16 MG/250ML-% IV SOLN
2.0000 ug/min | INTRAVENOUS | Status: DC
  Administered 2020-10-30 (×2): 35 ug/min via INTRAVENOUS
  Administered 2020-11-01: 12 ug/min via INTRAVENOUS
  Filled 2020-10-30 (×4): qty 250

## 2020-10-30 MED ORDER — SODIUM BICARBONATE 8.4 % IV SOLN
50.0000 meq | Freq: Once | INTRAVENOUS | Status: AC
  Administered 2020-10-30: 50 meq via INTRAVENOUS
  Filled 2020-10-30: qty 50

## 2020-10-30 MED ORDER — PHENYLEPHRINE CONCENTRATED 100MG/250ML (0.4 MG/ML) INFUSION SIMPLE
0.0000 ug/min | INTRAVENOUS | Status: DC
  Administered 2020-10-30: 275 ug/min via INTRAVENOUS
  Administered 2020-10-30: 100 ug/min via INTRAVENOUS
  Administered 2020-10-31: 150 ug/min via INTRAVENOUS
  Administered 2020-10-31: 100 ug/min via INTRAVENOUS
  Filled 2020-10-30 (×6): qty 250

## 2020-10-30 MED ORDER — VASOPRESSIN 20 UNITS/100 ML INFUSION FOR SHOCK
0.0000 [IU]/min | INTRAVENOUS | Status: DC
  Administered 2020-10-30 (×2): 0.04 [IU]/min via INTRAVENOUS
  Filled 2020-10-30 (×3): qty 100

## 2020-10-30 MED ORDER — VASOPRESSIN 20 UNIT/ML IV SOLN
INTRAVENOUS | Status: AC
  Filled 2020-10-30: qty 1

## 2020-10-30 MED ORDER — SODIUM CHLORIDE 0.9 % IV SOLN
1.0000 g | Freq: Three times a day (TID) | INTRAVENOUS | Status: DC
  Administered 2020-10-31: 1 g via INTRAVENOUS
  Filled 2020-10-30 (×3): qty 1

## 2020-10-30 MED ORDER — VASOPRESSIN 20 UNITS/100 ML INFUSION FOR SHOCK: 0.0000 [IU]/min | INTRAVENOUS | Status: DC

## 2020-10-30 MED ORDER — SODIUM BICARBONATE 8.4 % IV SOLN
INTRAVENOUS | Status: AC
  Administered 2020-10-30: 50 meq
  Filled 2020-10-30: qty 100

## 2020-10-30 MED ORDER — PROTAMINE SULFATE 10 MG/ML IV SOLN
50.0000 mg | Freq: Once | INTRAVENOUS | Status: AC
  Administered 2020-10-30: 50 mg via INTRAVENOUS
  Filled 2020-10-30: qty 5

## 2020-10-30 MED ORDER — INSULIN ASPART 100 UNIT/ML IV SOLN
5.0000 [IU] | Freq: Once | INTRAVENOUS | Status: AC
  Administered 2020-10-30: 5 [IU] via INTRAVENOUS

## 2020-10-30 MED ORDER — SODIUM BICARBONATE 8.4 % IV SOLN
INTRAVENOUS | Status: DC
  Filled 2020-10-30 (×4): qty 850

## 2020-10-30 MED ORDER — SUCCINYLCHOLINE CHLORIDE 20 MG/ML IJ SOLN
200.0000 mg | Freq: Once | INTRAMUSCULAR | Status: AC
  Administered 2020-10-30: 200 mg via INTRAVENOUS

## 2020-10-30 MED ORDER — SODIUM CHLORIDE 0.9% IV SOLUTION: Freq: Once | INTRAVENOUS | Status: AC

## 2020-10-30 MED ORDER — PHENYLEPHRINE HCL-NACL 10-0.9 MG/250ML-% IV SOLN: 0.0000 ug/min | INTRAVENOUS | Status: DC

## 2020-10-30 MED ORDER — LIDOCAINE HCL 1 % IJ SOLN
INTRAMUSCULAR | Status: DC | PRN
  Administered 2020-10-30: 5 mL

## 2020-10-30 MED ORDER — SODIUM CHLORIDE 0.9 % FOR CRRT: 500.0000 mL | INTRAVENOUS_CENTRAL | Status: DC | PRN

## 2020-10-30 MED ORDER — PHENYLEPHRINE HCL-NACL 10-0.9 MG/250ML-% IV SOLN
INTRAVENOUS | Status: AC
  Administered 2020-10-30: 125 ug/min via INTRAVENOUS
  Filled 2020-10-30: qty 250

## 2020-10-30 MED ORDER — FAMOTIDINE IN NACL 20-0.9 MG/50ML-% IV SOLN
20.0000 mg | INTRAVENOUS | Status: DC
  Administered 2020-10-30 – 2020-10-31 (×2): 20 mg via INTRAVENOUS
  Filled 2020-10-30 (×2): qty 50

## 2020-10-30 MED ORDER — ACETAMINOPHEN 325 MG PO TABS
650.0000 mg | ORAL_TABLET | Freq: Once | ORAL | Status: DC
  Filled 2020-10-30: qty 2

## 2020-10-30 NOTE — ED Notes (Signed)
Date and time results received: 10/30/20 0219   Test: Lactic Acid  Critical Value: 7.8  Name of Provider Notified: Wickline  Orders Received? Or Actions Taken?: NA

## 2020-10-30 NOTE — Progress Notes (Signed)
Pharmacy Antibiotic Note  Tobie Hellen is a 59 y.o. male admitted on 10/29/2020 with sepsis with possible urinary source and remote h/o ESBL E.coli.  Pharmacy has been consulted for Merrem dosing.  Plan: Merrem 1g IV Q12H.  Height: 6\' 1"  (185.4 cm) Weight: 109 kg (240 lb 4.8 oz) IBW/kg (Calculated) : 79.9  Temp (24hrs), Avg:101.8 F (38.8 C), Min:99.5 F (37.5 C), Max:104 F (40 C)  Recent Labs  Lab 10/30/20 0009 10/30/20 0153  WBC 1.7*  --   CREATININE 3.65*  --   LATICACIDVEN 4.4* 7.8*    Estimated Creatinine Clearance: 27.5 mL/min (A) (by C-G formula based on SCr of 3.65 mg/dL (H)).    Allergies  Allergen Reactions   Other     TB serum-reaction unknown. Provided via [facility] records     Thank you for allowing pharmacy to be a part of this patients care.  Wynona Neat, PharmD, BCPS  10/30/2020 4:29 AM

## 2020-10-30 NOTE — Sedation Documentation (Signed)
Pt taken to CT with RN, RT on monitor and vent for LEFT PCN placement.

## 2020-10-30 NOTE — ED Notes (Signed)
Report given to [facility]. 

## 2020-10-30 NOTE — Progress Notes (Signed)
Appreciate Dr. Malachi Carl help. Will get CT A/P in AM. Repeat labs now. Will probably need CRRT in AM. Continue aggressive measures, very guarded prognosis has been relayed to family.  Erskine Emery MD PCCM

## 2020-10-30 NOTE — ED Provider Notes (Signed)
D/w sister [name] via phone She was updated on plan I informed her of his critical illness  She states she is the main family point of contact   Ripley Fraise, MD 10/30/20 [phone]

## 2020-10-30 NOTE — ED Notes (Signed)
Date and time results received: 10/30/20 0054  Test: Lactic Acid Critical Value: 4.4  Name of Provider Notified: Wickline  Orders Received? Or Actions Taken?: N/A

## 2020-10-30 NOTE — Procedures (Signed)
Central Venous Hemodialysis Catheter Insertion Procedure Note  Brendan [name]  [id number]  09-18-1961  Date:10/30/20  Time:10:50 PM   Provider Performing:Brooke Moshe Cipro   Procedure: Insertion of Non-tunneled Central Venous Hemodialysis Catheter(36556)with US guidance (97471)    Indication(s) Hemodialysis  Consent Risks of the procedure as well as the alternatives and risks of each were explained to the patient and/or caregiver.  Consent for the procedure was obtained and is signed in the bedside chart  Anesthesia Topical only with 1% lidocaine   Timeout Verified patient identification, verified procedure, site/side was marked, verified correct patient position, special equipment/implants available, medications/allergies/relevant history reviewed, required imaging and test results available.  Sterile Technique Maximal sterile technique including full sterile barrier drape, hand hygiene, sterile gown, sterile gloves, mask, hair covering, sterile ultrasound probe cover (if used).  Procedure Description Area of catheter insertion was cleaned with betadine given chlorhexidine allergy and draped in sterile fashion.   With real-time ultrasound guidance a 20 cm curved dual lumen HD catheter was placed into the left internal jugular vein.  Nonpulsatile blood flow and easy flushing noted in all ports.  The catheter was sutured in place and sterile dressing applied.  Heparin to be instilled by bedside RN  Complications/Tolerance None; patient tolerated the procedure well. Chest X-ray is ordered to verify placement for internal jugular or subclavian cannulation.   EBL Minimal  Specimen(s) None    Brendan [name], ACNP  Pulmonary & Critical Care 10/30/2020, 10:51 PM

## 2020-10-30 NOTE — ED Provider Notes (Signed)
[facility] EMERGENCY DEPARTMENT Provider Note   CSN: [id number] Arrival date & time: 10/29/20  2314     History Chief Complaint  Patient presents with  . Emesis   Level 5 caveat due to acuity of condition Brendan [name] is a 59 y.o. male.  The history is provided by the patient and the EMS personnel.  Emesis Severity:  Severe Timing:  Intermittent Progression:  Worsening Chronicity:  New Relieved by:  Nothing Worsened by:  Nothing Associated symptoms: fever   Patient with extensive history including previous subdural hematoma, chronic kidney disease, left PERC nephrostomy tube in place, diabetes presents from nursing facility.  It is reported patient had nausea vomiting, fever and blood in his nephrostomy tube. No other details are known on arrival.  Patient is only able to give limited history     Past Medical History:  Diagnosis Date  . Acidosis   . Acute unilateral obstructive uropathy   . Allergic rhinitis   . Chronic kidney disease, stage IV (severe) ([place])   . Constipation   . Coronary atherosclerosis of native coronary artery   . Diabetes mellitus without complication ([place])   . DVT (deep venous thrombosis) ([place])   . Enlarged prostate with urinary obstruction   . Essential hypertension, malignant   . GERD (gastroesophageal reflux disease)   . Glaucoma   . Hemiparesis affecting right side as late effect of cerebrovascular accident ([place])   . Hemiplegia ([place])   . Hydronephrosis   . Hyperlipidemia   . Hypertension   . Hypokalemia   . Muscle weakness (generalized)   . Open wound of scalp without complication   . PVD (peripheral vascular disease) ([place])   . Retroperitoneal fibrosis   . Stricture or kinking of ureter   . Stroke [facility])    right sided hemiparesis  . Vitamin D deficiency     Patient Active Problem List   Diagnosis Date Noted  . Anorexia symptom   . Advanced care planning/counseling discussion   . Goals of care, counseling/discussion   .  Palliative care by specialist   . Subdural hematoma, acute ([place]) 01/26/2020  . SDH (subdural hematoma) ([place]) 01/23/2020  . DVT (deep venous thrombosis) ([place]) 12/19/2019  . Right leg DVT ([place]) 12/18/2019  . Symptomatic anemia 03/22/2018  . History of DVT in adulthood 03/22/2018  . Gross hematuria   . Nephrostomy tube displaced ([place]) 07/19/2017  . Pyelonephritis 02/16/2017  . 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 Coronary atherosclerosis of native coronary artery   . Essential hypertension   . Stroke ([place])   . Diabetes mellitus without complication ([place])   . Chronic kidney disease, stage IV (severe) ([place])   . GERD (gastroesophageal reflux disease)   . Glaucoma   . Hemiparesis affecting right side as late effect of cerebrovascular accident ([place])   . Enlarged prostate with urinary obstruction   . Constipation     Past Surgical History:  Procedure Laterality Date  . CRANIOTOMY Left 01/26/2020   Procedure: CRANIOTOMY HEMATOMA EVACUATION SUBDURAL;  Surgeon: Ashok Pall, MD;  Location: [place];  Service: Neurosurgery;  Laterality: Left;  . INTRAVASCULAR ULTRASOUND/IVUS N/A 01/13/2020   Procedure: Intravascular Ultrasound/IVUS;  Surgeon: Serafina Mitchell, MD;  Location: [place] CV LAB;  Service: Cardiovascular;  Laterality: N/A;  IJ, SVC, IVC  . IR GENERIC HISTORICAL  11/09/2016   IR NEPHROSTOMY EXCHANGE LEFT 11/09/2016 Corrie Mckusick, DO [facility]-INTERV RAD  . IR GENERIC HISTORICAL  01/09/2017   IR NEPHROSTOMY EXCHANGE LEFT 01/09/2017 [name], MD [facility]-INTERV  RAD  . IR NEPHROSTOGRAM LEFT THRU EXISTING ACCESS  11/12/2019  . IR NEPHROSTOMY EXCHANGE LEFT  03/06/2017  . IR NEPHROSTOMY EXCHANGE LEFT  04/13/2017  . IR NEPHROSTOMY EXCHANGE LEFT  06/08/2017  . IR NEPHROSTOMY EXCHANGE LEFT  07/20/2017  . IR NEPHROSTOMY EXCHANGE LEFT  08/03/2017  . IR NEPHROSTOMY EXCHANGE LEFT  09/28/2017  . IR NEPHROSTOMY EXCHANGE LEFT  11/29/2017  . IR NEPHROSTOMY EXCHANGE LEFT  01/31/2018  . IR NEPHROSTOMY EXCHANGE LEFT   04/04/2018  . IR NEPHROSTOMY EXCHANGE LEFT  06/27/2018  . IR NEPHROSTOMY EXCHANGE LEFT  09/19/2018  . IR NEPHROSTOMY EXCHANGE LEFT  12/10/2018  . IR NEPHROSTOMY EXCHANGE LEFT  06/26/2019  . IR NEPHROSTOMY EXCHANGE LEFT  10/16/2019  . IR NEPHROSTOMY EXCHANGE LEFT  01/26/2020  . IR NEPHROSTOMY EXCHANGE LEFT  05/27/2020  . IR NEPHROSTOMY EXCHANGE LEFT  08/19/2020  . IVC VENOGRAPHY N/A 01/13/2020   Procedure: IVC Venography;  Surgeon: Serafina Mitchell, MD;  Location: [place] CV LAB;  Service: Cardiovascular;  Laterality: N/A;  . LOWER EXTREMITY VENOGRAPHY Left 01/13/2020   Procedure: LOWER EXTREMITY VENOGRAPHY;  Surgeon: Serafina Mitchell, MD;  Location: [place] CV LAB;  Service: Cardiovascular;  Laterality: Left;  . NEPHROSTOMY TUBE PLACEMENT ([place] HX)    . WOUND EXPLORATION N/A 04/21/2020   Procedure: Wound revision, Scalp;  Surgeon: Ashok Pall, MD;  Location: [place];  Service: Neurosurgery;  Laterality: N/A;       Family History  Family history unknown: Yes    Social History   Tobacco Use  . Smoking status: Never Smoker  . Smokeless tobacco: Never Used  Vaping Use  . Vaping Use: Never used  Substance Use Topics  . Alcohol use: Not Currently  . Drug use: Not Currently    Home Medications Prior to Admission medications   Medication Sig Start Date End Date Taking? Authorizing Provider  acetaminophen (TYLENOL) 325 MG tablet Take 650 mg by mouth every 8 (eight) hours as needed for moderate pain or headache.    [provider]  Amino Acids-Protein Hydrolys (FEEDING SUPPLEMENT, PRO-STAT SUGAR FREE 64,) LIQD Take 30 mLs by mouth 3 (three) times daily. 02/13/20   Arrien, Jimmy Picket, MD  calcium carbonate (CALTRATE 600) 1500 (600 Ca) MG TABS tablet Take 1 tablet by mouth 2 (two) times daily.     [provider]  cetaphil (CETAPHIL) lotion Apply 1 application topically 2 (two) times daily. Apply to arms, legs, and feet    [provider]  Cholecalciferol 1000  units tablet Take 2,000 Units by mouth daily.     [provider]  ezetimibe (ZETIA) 10 MG tablet Take 10 mg by mouth every evening.     [provider]  famotidine (PEPCID) 20 MG tablet Take 1 tablet (20 mg total) by mouth at bedtime. 02/13/20 04/19/20  Arrien, Jimmy Picket, MD  guaiFENesin (MUCINEX) 600 MG 12 hr tablet Take 600 mg by mouth 2 (two) times daily.    [provider]  heparin 5000 UNIT/ML injection Inject 1 mL (5,000 Units total) into the skin every 8 (eight) hours. 02/13/20 04/19/20  Arrien, Jimmy Picket, MD  ibuprofen (ADVIL) 800 MG tablet Take 1 tablet (800 mg total) by mouth every 8 (eight) hours as needed. 04/21/20   Ashok Pall, MD  loratadine (CLARITIN) 10 MG tablet Take 10 mg by mouth daily.    [provider]  mycophenolate (CELLCEPT) 500 MG tablet Take 500 mg by mouth 2 (two) times daily.  [provider]  polyethylene glycol (MIRALAX / GLYCOLAX) 17 g packet Take 17 g by mouth daily as needed for mild constipation. 02/13/20   Arrien, Jimmy Picket, MD  senna (SENOKOT) 8.6 MG TABS tablet Take 8.6 mg by mouth daily.     [provider]  simvastatin (ZOCOR) 40 MG tablet Take 40 mg by mouth at bedtime.    [provider]  sodium bicarbonate 650 MG tablet Take 650 mg by mouth 3 (three) times daily.    [provider]  tamsulosin (FLOMAX) 0.4 MG CAPS capsule Take 0.4 mg by mouth at bedtime.    [provider]    Allergies    Other  Review of Systems   Review of Systems  Unable to perform ROS: Acuity of condition  Constitutional: Positive for fever.  Gastrointestinal: Positive for vomiting.    Physical Exam Updated Vital Signs BP (!) 139/119   Pulse (!) 146   Temp 99.5 F (37.5 C) (Oral)   Resp (!) 39   Ht 1.854 m (6\' 1" )   Wt 109 kg   SpO2 100%   BMI 31.70 kg/m   Physical Exam CONSTITUTIONAL: Ill-appearing HEAD: Normocephalic/atraumatic EYES: EOMI/PERRL ENMT: Mucous  membranes dry, poor dentition NECK: supple no meningeal signs CV: S1/S2 noted, tachycardic LUNGS: Tachypnea, but clear lung sounds bilaterally ABDOMEN: soft, nontender, no rebound or guarding, bowel sounds noted throughout abdomen GU: Diaper in place Nephrostomy tube noted on left NEURO: Pt is awake/alert/appropriate, moves all extremitiesx4.   EXTREMITIES: pulses normal/equal, full ROM SKIN: warm, color normal PSYCH: Unable to assess  ED Results / Procedures / Treatments   Labs (all labs ordered are listed, but only abnormal results are displayed) Labs Reviewed  COMPREHENSIVE METABOLIC PANEL - Abnormal; Notable for the following components:      Result Value   Glucose, Bld 165 (*)    BUN 31 (*)    Creatinine, Ser 3.65 (*)    GFR, Estimated 18 (*)    All other components within normal limits  URINALYSIS, ROUTINE W REFLEX MICROSCOPIC - Abnormal; Notable for the following components:   APPearance TURBID (*)    Hgb urine dipstick MODERATE (*)    Protein, ur >=300 (*)    Leukocytes,Ua LARGE (*)    RBC / HPF >50 (*)    Bacteria, UA RARE (*)    All other components within normal limits  LACTIC ACID, PLASMA - Abnormal; Notable for the following components:   Lactic Acid, Venous 4.4 (*)    All other components within normal limits  LACTIC ACID, PLASMA - Abnormal; Notable for the following components:   Lactic Acid, Venous 7.8 (*)    All other components within normal limits  CBC WITH DIFFERENTIAL/PLATELET - Abnormal; Notable for the following components:   WBC 1.7 (*)    Hemoglobin 12.9 (*)    MCHC 29.4 (*)    RDW 17.3 (*)    nRBC 1.7 (*)    Neutro Abs 1.4 (*)    Lymphs Abs 0.3 (*)    Monocytes Absolute 0.0 (*)    All other components within normal limits  BLOOD GAS, ARTERIAL - Abnormal; Notable for the following components:   pH, Arterial 7.346 (*)    pO2, Arterial 420 (*)    Bicarbonate 18.6 (*)    Acid-base deficit 7.6 (*)    All other components within normal limits    RESP PANEL BY RT-PCR (FLU A&B, COVID) ARPGX2  CULTURE, BLOOD (ROUTINE X 2)  CULTURE, BLOOD (  ROUTINE X 2)  URINE CULTURE  LIPASE, BLOOD  PROTIME-INR  APTT  LACTIC ACID, PLASMA    EKG EKG Interpretation  Date/Time:  Friday October 29 2020 23:33:28 EST Ventricular Rate:  103 PR Interval:    QRS Duration: 80 QT Interval:  301 QTC Calculation: 394 R Axis:   50 Text Interpretation: Sinus tachycardia RSR' in V1 or V2, probably normal variant Nonspecific T abnormalities, inferior leads Confirmed by Ripley Fraise [phone]) on 10/29/2020 11:49:13 PM   EKG Interpretation  Date/Time:  Saturday October 30 2020 01:30:23 EST Ventricular Rate:  160 PR Interval:    QRS Duration: 76 QT Interval:  295 QTC Calculation: 482 R Axis:   58 Text Interpretation: rhythm indeterminate Abnormal ekg Confirmed by Ripley Fraise [phone]) on 10/30/2020 1:58:11 AM        Radiology CT ABDOMEN PELVIS WO CONTRAST  Result Date: 10/30/2020 CLINICAL DATA:  Acute abdominal pain. Nausea and vomiting. Fever. Blood in the nephrostomy tube. EXAM: CT ABDOMEN AND PELVIS WITHOUT CONTRAST TECHNIQUE: Multidetector CT imaging of the abdomen and pelvis was performed following the standard protocol without IV contrast. COMPARISON:  03/22/2018 FINDINGS: Lower chest: Lung bases are clear. Hepatobiliary: No focal liver abnormality is seen. No gallstones, gallbladder wall thickening, or biliary dilatation. Pancreas: Unremarkable. No pancreatic ductal dilatation or surrounding inflammatory changes. Spleen: Normal in size without focal abnormality. Adrenals/Urinary Tract: A percutaneous nephrostomy tube is present. The pigtail demonstrates a shallow location within the kidney suggesting that it has withdrawn from the renal pelvis. The left kidney is diffusely heterogeneous in appearance with enlargement, heterogeneous hyperattenuation consistent with parenchymal hemorrhage, and small amounts of gas. There is extensive  stranding/hematoma surrounding the left kidney, extending to the anterior and posterior pararenal fascia and into the retroperitoneum and adjacent mesentery. This may indicate vascular injury resulting from displacement of the nephrostomy tube. Alternately, trauma or spontaneous hemorrhage could be the cause of hematoma. The right kidney is atrophic. Bladder is decompressed. Stomach/Bowel: Stomach is grossly unremarkable. Small bowel and colon are diffusely decompressed. No focal abnormalities identified. Vascular/Lymphatic: There is a peripherally calcified abdominal aortic aneurysm measuring 3.6 cm in diameter, unchanged since prior study. Periaortic retroperitoneal stranding and soft tissue prominence with somewhat nodular contours. This may represent retroperitoneal fibrosis, confluent lymphadenopathy, or aortitis. Appearances are similar to the prior study. Reproductive: Prostate is unremarkable. Other: No free air in the abdomen. Abdominal wall musculature appears intact. Infiltration in the anterior subcutaneous fat could represent contusions, varices, or cellulitis. Musculoskeletal: No destructive bone lesions. Degenerative changes in the hips. 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 IMPRESSION: 1. Left percutaneous nephrostomy tube is present. The pigtail demonstrates a shallow location within the kidney suggesting that it has withdrawn from the renal pelvis. 2. Prominent left renal hematoma with extensive stranding and hematoma in the pararenal spaces and apparently extending into the mesentery. 3. Periaortic retroperitoneal stranding and soft tissue prominence suggesting retroperitoneal fibrosis, confluent lymphadenopathy, or aortitis. Appearances are similar to the prior study. 4. 3.6 cm peripherally calcified abdominal aortic aneurysm, unchanged since prior study. 5. 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 6.  Infiltration in the anterior subcutaneous fat could represent contusions, varices, or cellulitis. Aortic Atherosclerosis (ICD10-I70.0). These results were called by telephone at the time of interpretation on 10/30/2020 at 3:11 am to provider Ripley Fraise , who verbally acknowledged these results. Electronically Signed   By: Lucienne Capers M.D.   On: 10/30/2020 03:16   CT Head  Wo Contrast  Result Date: 10/30/2020 CLINICAL DATA:  Nausea and vomiting, fever, and blood in the nephrostomy tube starting today. EXAM: CT HEAD WITHOUT CONTRAST TECHNIQUE: Contiguous axial images were obtained from the base of the skull through the vertex without intravenous contrast. COMPARISON:  02/02/2020 FINDINGS: Brain: Subacute subdural hematoma in the left frontal region. Maximal depth measures about 1.5 cm. Mildly increased maximal thickness since the previous study, but overall, the hematoma is decreased in size since prior study. Subdural gas is also decreased. Encephalomalacia in the left frontal lobe. Improving left-to-right midline shift. Changes are all consistent with postoperative change. No ventricular dilatation. No developing acute intracranial changes. Vascular: Mild intracranial arterial vascular calcifications. Skull: Postoperative left frontotemporal parietal craniotomy with plate and screw fixation of the bone flap. No acute depressed skull fractures. Sinuses/Orbits: Expansile filling of the maxillary antra with extension into the right nasopharynx. Bone remodeling and internal calcification. No change since previous study. Changes consistent with chronic sinusitis, possibly with polyposis. Calcifications suggest atypical infection, possibly fungal superinfection. Other: None. IMPRESSION: 1. Postoperative left frontotemporal parietal craniotomy. Postoperative changes with underlying subdural hematoma, subdural gas, and encephalomalacia. Postoperative changes are decreasing since prior study. 2. Expansile  opacification of the maxillary antra with internal calcifications suggesting chronic inflammatory process, possibly fungal superinfection. No change. Electronically Signed   By: Lucienne Capers M.D.   On: 10/30/2020 03:23   DG Chest Port 1 View  Result Date: 10/30/2020 CLINICAL DATA:  Intubation. EXAM: PORTABLE CHEST 1 VIEW COMPARISON:  10/30/2020 FINDINGS: Endotracheal and enteric tubes are unchanged in position. Shallow inspiration. Heart size and pulmonary vascularity are normal. Lungs are clear. No change. IMPRESSION: No active disease. Electronically Signed   By: Lucienne Capers M.D.   On: 10/30/2020 05:09   DG Chest Port 1 View  Result Date: 10/30/2020 CLINICAL DATA:  Fever. EXAM: PORTABLE CHEST 1 VIEW COMPARISON:  01/24/2020 FINDINGS: The lungs are essentially clear. There is no pneumothorax or large pleural effusion. The trachea is off midline which is felt to be secondary to some degree of patient rotation. The heart size is stable. There is no acute osseous abnormality. IMPRESSION: No active disease. Electronically Signed   By: Constance Holster M.D.   On: 10/30/2020 00:37    Procedures .Critical Care Performed by: Ripley Fraise, MD Authorized by: Ripley Fraise, MD   Critical care provider statement:    Critical care time (minutes):  120   Critical care start time:  10/30/2020 1:00 AM   Critical care end time:  10/30/2020 3:00 AM   Critical care time was exclusive of:  Separately billable procedures and treating other patients   Critical care was necessary to treat or prevent imminent or life-threatening deterioration of the following conditions:  Sepsis, shock, renal failure, respiratory failure and metabolic crisis   Critical care was time spent personally by me on the following activities:  Development of treatment plan with patient or surrogate, discussions with consultants, evaluation of patient's response to treatment, examination of patient, obtaining history from  patient or surrogate, re-evaluation of patient's condition, pulse oximetry, ordering and review of radiographic studies, ordering and review of laboratory studies, ordering and performing treatments and interventions, review of old charts and ventilator management   I assumed direction of critical care for this patient from another provider in my specialty: no   Procedure Name: Intubation Date/Time: 10/30/2020 4:35 AM Performed by: Ripley Fraise, MD Pre-anesthesia Checklist: Emergency Drugs available, Suction available, Patient identified and Patient being monitored Oxygen Delivery Method: Nasal  cannula Preoxygenation: Pre-oxygenation with 100% oxygen Induction Type: Rapid sequence Ventilation: Mask ventilation without difficulty Laryngoscope Size: Glidescope Grade View: Grade I Tube size: 7.5 mm Number of attempts: 1 Airway Equipment and Method: Video-laryngoscopy Placement Confirmation: ETT inserted through vocal cords under direct vision,  Breath sounds checked- equal and bilateral and CO2 detector Secured at: 23 cm Tube secured with: ETT holder        Medications Ordered in ED Medications  lactated ringers infusion ( Intravenous New Bag/Given 10/30/20 0204)  0.9 %  sodium chloride infusion (has no administration in time range)  norepinephrine (LEVOPHED) 4mg  in 225mL premix infusion (2 mcg/min Intravenous New Bag/Given 10/30/20 0353)  propofol (DIPRIVAN) 1000 MG/100ML infusion (has no administration in time range)  lactated ringers bolus 1,000 mL (0 mLs Intravenous Stopped 10/30/20 0143)    And  lactated ringers bolus 1,000 mL (0 mLs Intravenous Stopped 10/30/20 0222)    And  lactated ringers bolus 1,000 mL (0 mLs Intravenous Stopped 10/30/20 0204)    And  lactated ringers bolus 500 mL (0 mLs Intravenous Stopped 10/30/20 0205)  ceFEPIme (MAXIPIME) 2 g in sodium chloride 0.9 % 100 mL IVPB (0 g Intravenous Stopped 10/30/20 0143)  acetaminophen (TYLENOL) suppository 650 mg  (650 mg Rectal Given 10/30/20 0046)  LORazepam (ATIVAN) injection 0.5 mg (0.5 mg Intravenous Given 10/30/20 0134)  metoprolol tartrate (LOPRESSOR) 5 MG/5ML injection (3 mg  Given 10/30/20 0325)  protamine injection 50 mg (50 mg Intravenous Given 10/30/20 0354)    ED Course  I have reviewed the triage vital signs and the nursing notes.  Pertinent labs & imaging results that were available during my care of the patient were reviewed by me and considered in my medical decision making (see chart for details).    MDM Rules/Calculators/A&P                          12:40 AM Patient presents from nursing home and is ill on arrival.  Code sepsis has been called for presumed infection. IV fluids and antibiotics been ordered.  Patient may require CT imaging abdomen pelvis due to change in nephrostomy tube output.  We will follow closely 1:59 AM Patient became acutely ill.  He is now tachycardic to the 160s and febrile to 104.  He is awake alert but is tachypneic.  His x-ray has been reviewed and is negative for pneumonia. Unclear cause of his fever We will proceed with CT imaging of his abdomen pelvis, and CT head since he has a history of subdural hematoma. Unclear cause of abrupt tachycardia and hypertension, no signs of any urinary retention on bladder scan.  He is having bloody output from the nephrostomy tube Continue resuscitation. 2:18 AM Patient has continued tachycardic and tachypnea.  His mental status is unchanged. He does have a previous history of DVT, therefore PE is a possibility, but unable to undergo CT angio chest due to chronic kidney disease.  However unclear cause of fever at this time as well as an elevated lactate.  Intra-abdominal infection is a possibility at this time because of sepsis.  CT abdomen pelvis is pending at this time 2:34 AM Discussed the case with his nursing home.  They report the fever and vomiting is new today.  It is also reported that he is on heparin  subcutaneous every 8 hours for DVT Also spoke to his mother via phone.  She reports that his Sister Elmo Putt is the guardian.  However she  is unavailable by phone. 3:39 AM Discussed with radiology, patient has a large left renal hematoma. I discussed this with urology Dr. Alyson Ingles.  He requests patient will need to be admitted to intensive care, and will need to have any anticoagulation reversed.  He will need to have his percutaneous nephrostomy tube exchanged by IR  I confirmed with nursing home that his last dose of heparin subcutaneous 5000 units was at 2200 on November 26. Per pharmacy, patient will require 1 dose of protamine 50 mg.  This has been ordered  Patient had persistent tachycardia above 170, was unable to determine if this was a sinus rhythm versus SVT versus atrial flutter.  3 mg of metoprolol was given, and heart rate lowered to the 140s that revealed sinus tachycardia.   EKG Interpretation  Date/Time:  Saturday October 30 2020 03:25:04 EST Ventricular Rate:  147 PR Interval:    QRS Duration: 74 QT Interval:  237 QTC Calculation: 371 R Axis:   52 Text Interpretation: Sinus tachycardia Nonspecific T abnormalities, lateral leads Confirmed by Ripley Fraise [phone]) on 10/30/2020 3:39:15 AM      3:47 AM Discussed case with critical care Dr. Apolinar Junes They will accept patient to ICU at [facility] 4:19 AM Heart rate has stabilized in the 140s, blood pressure is in the 90s currently on Levophed Patient's respiratory rate remains very high and he reports he is feeling tired.  At this point I feel intubation is warranted to protect his airway and to avoid respiratory arrest   Sepsis - Repeat Assessment  Sepsis hemodynamic reassessment completed at: 4:29 AM  5:13 AM Patient tolerated intubation well. Patient will now be transferred to [facility].  He will need to undergo evaluation by interventional radiology for new percutaneous nephrostomy tube Nursing was  unable to pass foley catheter Patient is being treated for presumed urosepsis. No significant metabolic acidosis on ABG after intubation. Still unclear cause of tachypnea, pulmonary embolism is still a consideration due to h/o DVT but due to renal failure unable to obtain CT chest, and due to renal hematoma, patient had to have his heparin reversed Made another attempt to call his sister via phone, but she did not answer. Final Clinical Impression(s) / ED Diagnoses Final diagnoses:  Hematoma of left kidney, initial encounter  AKI (acute kidney injury) ([place])  Dehydration  Leukopenia, unspecified type  Acute respiratory failure, unspecified whether with hypoxia or hypercapnia ([place])  Acute cystitis with hematuria    Rx / DC Orders ED Discharge Orders    None       Ripley Fraise, MD 10/30/20 [phone]

## 2020-10-30 NOTE — Consult Note (Signed)
Chief Complaint: Patient was seen in consultation today for obstructive uropathy, sepsis, left PCN malposition  Referring Physician(s): Dr. Erskine Emery  Supervising Physician: Dr. Ruthann Cancer  Patient Status: [facility] - In-pt  History of Present Illness: Brendan [name] is a 59 y.o. 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  He has had a left nephrostomy tube since 2017, most recently exchanged 08/19/20.  Appears the right-sided tube was removed in 2019 as it was no longer needed. His next scheduled routine exchanged was planned for 11/08/20.  He presented to [facility] with nausea, vomiting, blood in his nephrostomy tube.  Found to have sepsis; tachycardia, hypotension.    CT Abdomen Pelvis 11/27 showed: IMPRESSION: 1. Left percutaneous nephrostomy tube is present. The pigtail demonstrates a shallow location within the kidney suggesting that it has withdrawn from the renal pelvis. 2. Prominent left renal hematoma with extensive stranding and hematoma in the pararenal spaces and apparently extending into the mesentery. 3. Periaortic retroperitoneal stranding and soft tissue prominence suggesting retroperitoneal fibrosis, confluent lymphadenopathy, or aortitis. Appearances are similar to the prior study. 4. 3.6 cm peripherally calcified abdominal aortic aneurysm, unchanged since prior study. 5. 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 6. Infiltration in the anterior subcutaneous fat could represent contusions, varices, or cellulitis.  IR consulted for replacement/resposition of the left nephrostomy tube.   Patient assessed at bedside. He is intubated.  Opens eyes to stimulus. Appears critically ill.   Past Medical History:  Diagnosis Date  . Acidosis   . Acute unilateral obstructive uropathy   . Allergic  rhinitis   . Chronic kidney disease, stage IV (severe) ([place])   . Constipation   . Coronary atherosclerosis of native coronary artery   . Diabetes mellitus without complication ([place])   . DVT (deep venous thrombosis) ([place])   . Enlarged prostate with urinary obstruction   . Essential hypertension, malignant   . GERD (gastroesophageal reflux disease)   . Glaucoma   . Hemiparesis affecting right side as late effect of cerebrovascular accident ([place])   . Hemiplegia ([place])   . Hydronephrosis   . Hyperlipidemia   . Hypertension   . Hypokalemia   . Muscle weakness (generalized)   . Open wound of scalp without complication   . PVD (peripheral vascular disease) ([place])   . Retroperitoneal fibrosis   . Stricture or kinking of ureter   . Stroke [facility])    right sided hemiparesis  . Vitamin D deficiency     Past Surgical History:  Procedure Laterality Date  . CRANIOTOMY Left 01/26/2020   Procedure: CRANIOTOMY HEMATOMA EVACUATION SUBDURAL;  Surgeon: Ashok Pall, MD;  Location: [place];  Service: Neurosurgery;  Laterality: Left;  . INTRAVASCULAR ULTRASOUND/IVUS N/A 01/13/2020   Procedure: Intravascular Ultrasound/IVUS;  Surgeon: Serafina Mitchell, MD;  Location: [place] [facility];  Service: Cardiovascular;  Laterality: N/A;  IJ, SVC, IVC  . IR GENERIC HISTORICAL  11/09/2016   IR NEPHROSTOMY EXCHANGE LEFT 11/09/2016 Corrie Mckusick, DO [facility]-INTERV RAD  . IR GENERIC HISTORICAL  01/09/2017   IR NEPHROSTOMY EXCHANGE LEFT 01/09/2017 [name], MD [facility]-INTERV RAD  . IR NEPHROSTOGRAM LEFT THRU EXISTING ACCESS  11/12/2019  . IR NEPHROSTOMY EXCHANGE LEFT  03/06/2017  . IR NEPHROSTOMY EXCHANGE LEFT  04/13/2017  . IR NEPHROSTOMY EXCHANGE LEFT  06/08/2017  . IR NEPHROSTOMY EXCHANGE LEFT  07/20/2017  . IR NEPHROSTOMY EXCHANGE LEFT  08/03/2017  . IR NEPHROSTOMY EXCHANGE LEFT  09/28/2017  . IR NEPHROSTOMY EXCHANGE LEFT  11/29/2017  . IR NEPHROSTOMY EXCHANGE LEFT  01/31/2018  . IR NEPHROSTOMY EXCHANGE LEFT  04/04/2018  . IR  NEPHROSTOMY EXCHANGE LEFT  06/27/2018  . IR NEPHROSTOMY EXCHANGE LEFT  09/19/2018  . IR NEPHROSTOMY EXCHANGE LEFT  12/10/2018  . IR NEPHROSTOMY EXCHANGE LEFT  06/26/2019  . IR NEPHROSTOMY EXCHANGE LEFT  10/16/2019  . IR NEPHROSTOMY EXCHANGE LEFT  01/26/2020  . IR NEPHROSTOMY EXCHANGE LEFT  05/27/2020  . IR NEPHROSTOMY EXCHANGE LEFT  08/19/2020  . IVC VENOGRAPHY N/A 01/13/2020   Procedure: IVC Venography;  Surgeon: Serafina Mitchell, MD;  Location: [place] [facility];  Service: Cardiovascular;  Laterality: N/A;  . LOWER EXTREMITY VENOGRAPHY Left 01/13/2020   Procedure: LOWER EXTREMITY VENOGRAPHY;  Surgeon: Serafina Mitchell, MD;  Location: [place] [facility];  Service: Cardiovascular;  Laterality: Left;  . NEPHROSTOMY TUBE PLACEMENT ([place] HX)    . WOUND EXPLORATION N/A 04/21/2020   Procedure: Wound revision, Scalp;  Surgeon: Ashok Pall, MD;  Location: [place];  Service: Neurosurgery;  Laterality: N/A;    Allergies: Other  Medications: Prior to Admission medications   Medication Sig Start Date End Date Taking? Authorizing Provider  acetaminophen (TYLENOL) 325 MG tablet Take 650 mg by mouth every 8 (eight) hours as needed for moderate pain or headache.   Yes [provider]  Amino Acids-Protein Hydrolys (FEEDING SUPPLEMENT, PRO-STAT SUGAR FREE 64,) LIQD Take 30 mLs by mouth 3 (three) times daily. 02/13/20  Yes Arrien, Jimmy Picket, MD  calcium carbonate (CALTRATE 600) 1500 (600 Ca) MG TABS tablet Take 1 tablet by mouth 2 (two) times daily.    Yes [provider]  cetaphil (CETAPHIL) lotion Apply 1 application topically every 12 (twelve) hours as needed for dry skin. Apply to arms, legs, and feet   Yes [provider]  Cholecalciferol 1000 units tablet Take 2,000 Units by mouth daily.    Yes [provider]  ezetimibe (ZETIA) 10 MG tablet Take 10 mg by mouth every evening.    Yes [provider]  famotidine (PEPCID) 20 MG tablet Take 1 tablet (20 mg total)  by mouth at bedtime. 02/13/20 10/30/21 Yes Arrien, Jimmy Picket, MD  heparin 5000 UNIT/ML injection Inject 1 mL (5,000 Units total) into the skin every 8 (eight) hours. Patient taking differently: Inject 5,000 Units into the skin every 8 (eight) hours. At 0600, 1400, and 2200 02/13/20 10/30/21 Yes Arrien, Jimmy Picket, MD  HYDROcodone-acetaminophen (NORCO/VICODIN) 5-325 MG tablet Take 1 tablet by mouth every 6 (six) hours as needed for pain. 07/27/20  Yes [provider]  mycophenolate (CELLCEPT) 500 MG tablet Take 500 mg by mouth 2 (two) times daily.   Yes [provider]  Omega-3 Fatty Acids (FISH OIL) 1000 MG CAPS Take 1,000 mg by mouth in the morning and at bedtime.   Yes [provider]  polyethylene glycol (MIRALAX / GLYCOLAX) 17 g packet Take 17 g by mouth daily as needed for mild constipation. Patient taking differently: Take 17 g by mouth daily as needed for mild constipation. Mix in 4-8oz of liquid 02/13/20  Yes Arrien, Jimmy Picket, MD  senna (SENOKOT) 8.6 MG TABS tablet Take 8.6 mg by mouth daily.    Yes [provider]  simvastatin (ZOCOR) 80 MG tablet Take 80 mg by mouth at bedtime.   Yes [provider]  sodium bicarbonate 650 MG tablet Take 650 mg by mouth  3 (three) times daily.   Yes [provider]  tamsulosin (FLOMAX) 0.4 MG CAPS capsule Take 0.4 mg by mouth at bedtime.   Yes [provider]  ibuprofen (ADVIL) 800 MG tablet Take 1 tablet (800 mg total) by mouth every 8 (eight) hours as needed. Patient not taking: Reported on 10/30/2020 04/21/20   Ashok Pall, MD     Family History  Family history unknown: Yes    Social History   Socioeconomic History  . Marital status: Single    Spouse name: Not on file  . Number of children: Not on file  . Years of education: Not on file  . Highest education level: Not on file  Occupational History  . Not on file  Tobacco Use  . Smoking status: Never Smoker  .  Smokeless tobacco: Never Used  Vaping Use  . Vaping Use: Never used  Substance and Sexual Activity  . Alcohol use: Not Currently  . Drug use: Not Currently  . Sexual activity: Not Currently  Other Topics Concern  . Not on file  Social History Narrative  . Not on file   Social Determinants of Health   Financial Resource Strain:   . Difficulty of Paying Living Expenses: Not on file  Food Insecurity:   . Worried About Charity fundraiser in the Last Year: Not on file  . Ran Out of Food in the Last Year: Not on file  Transportation Needs:   . Lack of Transportation (Medical): Not on file  . Lack of Transportation (Non-Medical): Not on file  Physical Activity:   . Days of Exercise per Week: Not on file  . Minutes of Exercise per Session: Not on file  Stress:   . Feeling of Stress : Not on file  Social Connections:   . Frequency of Communication with Friends and Family: Not on file  . Frequency of Social Gatherings with Friends and Family: Not on file  . Attends Religious Services: Not on file  . Active Member of Clubs or Organizations: Not on file  . Attends Archivist Meetings: Not on file  . Marital Status: Not on file     Review of Systems: A 12 point ROS discussed and pertinent positives are indicated in the HPI above.  All other systems are negative.  Review of Systems  Unable to perform ROS: Acuity of condition    Vital Signs: BP (!) 160/146   Pulse (!) 155   Temp 98.1 F (36.7 C) (Axillary)   Resp (!) 29   Ht 6\' 1"  (1.854 m)   Wt 250 lb (113.4 kg)   SpO2 100%   BMI 32.98 kg/m   Physical Exam Vitals and nursing note reviewed.  Constitutional:      General: He is not in acute distress.    Appearance: Normal appearance. He is ill-appearing.  Cardiovascular:     Rate and Rhythm: Tachycardia present.  Pulmonary:     Comments: intubated Abdominal:     Comments: L PCN in place, dislodged  Skin:    General: Skin is warm.  Neurological:      Mental Status: He is alert.     Comments: sedated      MD Evaluation Airway: Other (comments) Airway comments: intubated Heart: WNL Abdomen: WNL Chest/ Lungs: WNL ASA  Classification: 3 Mallampati/Airway Score: Three   Imaging: CT ABDOMEN PELVIS WO CONTRAST  Result Date: 10/30/2020 CLINICAL DATA:  Acute abdominal pain. Nausea and vomiting. Fever. Blood in the nephrostomy  tube. EXAM: CT ABDOMEN AND PELVIS WITHOUT CONTRAST TECHNIQUE: Multidetector CT imaging of the abdomen and pelvis was performed following the standard protocol without IV contrast. COMPARISON:  03/22/2018 FINDINGS: Lower chest: Lung bases are clear. Hepatobiliary: No focal liver abnormality is seen. No gallstones, gallbladder wall thickening, or biliary dilatation. Pancreas: Unremarkable. No pancreatic ductal dilatation or surrounding inflammatory changes. Spleen: Normal in size without focal abnormality. Adrenals/Urinary Tract: A percutaneous nephrostomy tube is present. The pigtail demonstrates a shallow location within the kidney suggesting that it has withdrawn from the renal pelvis. The left kidney is diffusely heterogeneous in appearance with enlargement, heterogeneous hyperattenuation consistent with parenchymal hemorrhage, and small amounts of gas. There is extensive stranding/hematoma surrounding the left kidney, extending to the anterior and posterior pararenal fascia and into the retroperitoneum and adjacent mesentery. This may indicate vascular injury resulting from displacement of the nephrostomy tube. Alternately, trauma or spontaneous hemorrhage could be the cause of hematoma. The right kidney is atrophic. Bladder is decompressed. Stomach/Bowel: Stomach is grossly unremarkable. Small bowel and colon are diffusely decompressed. No focal abnormalities identified. Vascular/Lymphatic: There is a peripherally calcified abdominal aortic aneurysm measuring 3.6 cm in diameter, unchanged since prior study. Periaortic  retroperitoneal stranding and soft tissue prominence with somewhat nodular contours. This may represent retroperitoneal fibrosis, confluent lymphadenopathy, or aortitis. Appearances are similar to the prior study. Reproductive: Prostate is unremarkable. Other: No free air in the abdomen. Abdominal wall musculature appears intact. Infiltration in the anterior subcutaneous fat could represent contusions, varices, or cellulitis. Musculoskeletal: No destructive bone lesions. Degenerative changes in the hips. 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 IMPRESSION: 1. Left percutaneous nephrostomy tube is present. The pigtail demonstrates a shallow location within the kidney suggesting that it has withdrawn from the renal pelvis. 2. Prominent left renal hematoma with extensive stranding and hematoma in the pararenal spaces and apparently extending into the mesentery. 3. Periaortic retroperitoneal stranding and soft tissue prominence suggesting retroperitoneal fibrosis, confluent lymphadenopathy, or aortitis. Appearances are similar to the prior study. 4. 3.6 cm peripherally calcified abdominal aortic aneurysm, unchanged since prior study. 5. 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 6. Infiltration in the anterior subcutaneous fat could represent contusions, varices, or cellulitis. Aortic Atherosclerosis (ICD10-I70.0). These results were called by telephone at the time of interpretation on 10/30/2020 at 3:11 am to provider Ripley Fraise , who verbally acknowledged these results. Electronically Signed   By: Lucienne Capers M.D.   On: 10/30/2020 03:16   CT Head Wo Contrast  Result Date: 10/30/2020 CLINICAL DATA:  Nausea and vomiting, fever, and blood in the nephrostomy tube starting today. EXAM: CT HEAD WITHOUT CONTRAST TECHNIQUE: Contiguous axial images were obtained from the base of the skull through the vertex without  intravenous contrast. COMPARISON:  02/02/2020 FINDINGS: Brain: Subacute subdural hematoma in the left frontal region. Maximal depth measures about 1.5 cm. Mildly increased maximal thickness since the previous study, but overall, the hematoma is decreased in size since prior study. Subdural gas is also decreased. Encephalomalacia in the left frontal lobe. Improving left-to-right midline shift. Changes are all consistent with postoperative change. No ventricular dilatation. No developing acute intracranial changes. Vascular: Mild intracranial arterial vascular calcifications. Skull: Postoperative left frontotemporal parietal craniotomy with plate and screw fixation of the bone flap. No acute depressed skull fractures. Sinuses/Orbits: Expansile filling of the maxillary antra with extension into the right nasopharynx. Bone remodeling and internal calcification. No change since previous study. Changes consistent with chronic  sinusitis, possibly with polyposis. Calcifications suggest atypical infection, possibly fungal superinfection. Other: None. IMPRESSION: 1. Postoperative left frontotemporal parietal craniotomy. Postoperative changes with underlying subdural hematoma, subdural gas, and encephalomalacia. Postoperative changes are decreasing since prior study. 2. Expansile opacification of the maxillary antra with internal calcifications suggesting chronic inflammatory process, possibly fungal superinfection. No change. Electronically Signed   By: Lucienne Capers M.D.   On: 10/30/2020 03:23   DG CHEST PORT 1 VIEW  Result Date: 10/30/2020 CLINICAL DATA:  Hypoxia.  Central catheter placement EXAM: PORTABLE CHEST 1 VIEW COMPARISON:  October 30, 2020 study obtained earlier in the day FINDINGS: Central catheter tip is in the superior vena cava. Endotracheal tube tip is 4.4 cm above the carina. Nasogastric tube tip and side port are below the diaphragm. No pneumothorax. Lungs are clear. Heart is upper normal in size  with pulmonary vascularity normal. No adenopathy. No bone lesions. IMPRESSION: Tube and catheter positions as described without pneumothorax. Lungs clear. Stable cardiac silhouette. Electronically Signed   By: Lowella Grip III M.D.   On: 10/30/2020 08:12   DG Chest Port 1 View  Result Date: 10/30/2020 CLINICAL DATA:  Intubation. EXAM: PORTABLE CHEST 1 VIEW COMPARISON:  10/30/2020 FINDINGS: Endotracheal and enteric tubes are unchanged in position. Shallow inspiration. Heart size and pulmonary vascularity are normal. Lungs are clear. No change. IMPRESSION: No active disease. Electronically Signed   By: Lucienne Capers M.D.   On: 10/30/2020 05:09   DG Chest Port 1 View  Result Date: 10/30/2020 CLINICAL DATA:  Fever. EXAM: PORTABLE CHEST 1 VIEW COMPARISON:  01/24/2020 FINDINGS: The lungs are essentially clear. There is no pneumothorax or large pleural effusion. The trachea is off midline which is felt to be secondary to some degree of patient rotation. The heart size is stable. There is no acute osseous abnormality. IMPRESSION: No active disease. Electronically Signed   By: Constance Holster M.D.   On: 10/30/2020 00:37    Labs:  CBC: Recent Labs    02/11/20 0536 02/11/20 0536 04/21/20 1300 04/21/20 1300 04/21/20 1325 10/30/20 0009 10/30/20 1038 10/30/20 1110  WBC 6.1  --  6.3  --   --  1.7* 21.1*  --   HGB 9.3*   < > 10.6*   < > 11.2* 12.9* 7.3* 7.5*  HCT 30.7*   < > 36.0*   < > 33.0* 43.9 24.5* 22.0*  PLT 256  --  219  --   --  173 158  --    < > = values in this interval not displayed.    COAGS: Recent Labs    01/22/20 2213 01/23/20 0235 10/30/20 0009 10/30/20 1038  INR 1.2 1.2 1.1 1.6*  APTT 48* 40* 32 39*    BMP: Recent Labs    02/10/20 0500 02/10/20 0500 02/11/20 0536 02/11/20 0536 02/12/20 0358 02/12/20 0358 04/21/20 1300 04/21/20 1300 04/21/20 1325 10/30/20 0009 10/30/20 1038 10/30/20 1110  NA 135   < > 135   < > 136   < > 139   < > 142 139 140 141   K 4.0   < > 4.4   < > 4.8   < > 4.5   < > 4.7 4.3 4.8 5.0  CL 99   < > 100   < > 102   < > 105  --  106 105 106  --   CO2 23   < > 24   < > 22  --  24  --   --  23 16*  --   GLUCOSE 151*   < > 165*   < > 184*   < > 96  --  94 165* 122*  --   BUN 50*   < > 52*   < > 49*   < > 52*  --  44* 31* 31*  --   CALCIUM 8.9   < > 8.9   < > 9.2  --  9.2  --   --  8.9 7.8*  --   CREATININE 2.33*   < > 2.29*   < > 2.16*   < > 2.93*  --  3.20* 3.65* 4.57*  --   GFRNONAA 29*   < > 30*   < > 32*  --  22*  --   --  18* 14*  --   GFRAA 34*  --  35*  --  37*  --  26*  --   --   --   --   --    < > = values in this interval not displayed.    LIVER FUNCTION TESTS: Recent Labs    02/11/20 0536 02/12/20 0358 10/30/20 0009 10/30/20 1038  BILITOT 0.5 0.6 0.3 0.8  AST 23 21 28  55*  ALT 46* 40 28 32  ALKPHOS 83 80 91 49  PROT 6.8 6.9 7.6 4.7*  ALBUMIN 2.6* 2.7* 3.5 2.1*    TUMOR MARKERS: No results for input(s): AFPTM, CEA, CA199, CHROMGRNA in the last 8760 hours.  Assessment and Plan: Septic shock Obstructive uropathy Dislodged left percutaneous nephrostomy tube Patient admitted with overwhelming septic shock.  Unstable. Being optimized with medical intervention.  IR consulted for replacement of left nephrostomy tube as likely  source of sepsis.  Spoke with sister, Elmo Putt, over the phone.  She reports that family was in town yesterday visiting with patient before he became ill.  She has been updated by critical care team that patient is critically ill.  Described our plans to proceed with nephrostomy tube exchange if patient stable and able to tolerate repositioning to either prone or decubitus position while on vent.  She verbalizes understanding and is agreeable.   Risks and benefits of left PCN placement was discussed with the patient including, but not limited to, infection, bleeding, significant bleeding causing loss or decrease in renal function or damage to adjacent structures.   All of the  patient's questions were answered, patient is agreeable to proceed.  Consent signed and in chart.  Thank you for this interesting consult.  I greatly enjoyed meeting Woodrow Drab and look forward to participating in their care.  A copy of this report was sent to the requesting provider on this date.  Electronically Signed: Docia Barrier, PA 10/30/2020, 12:07 PM   I spent a total of 40 Minutes    in face to face in clinical consultation, greater than 50% of which was counseling/coordinating care for obstructive uropathy, left nephrostomy tube malposition.

## 2020-10-30 NOTE — Progress Notes (Signed)
Patient transported to IR/CT & back to room on vent with no problems.

## 2020-10-30 NOTE — Progress Notes (Signed)
Patient arrived via [facility], on stretcher. Provider at bedside. No acute distress noted.

## 2020-10-30 NOTE — H&P (Signed)
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 He presented to OSH ED with nausea, vomiting, fever and blood in his nephrostomy tube that began the day of admission. Imaging in the ED found a renal hematoma.  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 He presented to OSH ED with nausea, vomiting, fever and blood in his nephrostomy tube that began the day of admission.   In the ED, code sepsis was called due to concern for infection, fluids and cefepime were given. CT abdomen pelvis revealed shallow location of left percutaneous nephrostomy tube suggesting that it has withdrawn from the renal pelvis and a prominent left renal hematoma. He was tachycardic to the 160s and febrile to 104. CXR was unrevealing.   Per ED documentation, urology recommended admission to intensive care and reversal of anticoagulation. 1 dose of protamine 50mg  was administered to reverse the nursing home reported anticoagulation (per ED documentation, his last dose of heparin subcutaneous 5000 units was at 2200 on November 26th).   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 IVC filter not able to be placed due to extensive DVT -Subdural hematoma, s/p craniotomy 01/26/2020  Significant Hospital Events   Presented 10/30/20 to OSH ED, found to have renal hematoma and concern for sepsis  Consults:  Urology  Procedures:  NA  Significant Diagnostic Tests:   10/30/20 CT Abdomen Pelvis WO contrast: 1. Left percutaneous nephrostomy tube present, shallow location within the kidney suggesting that it has withdrawn from the renal pelvis 2. Prominent left renal hematoma with extensive stranding and hematoma in the pararenal spaces and apparently extending into the mesentery 3. Periaortic retroperitoneal stranding and soft tissue prominence suggesting retroperitoneal fibrosis, confluent lymphadenopathy, or aortitis. Appearances are similar to the prior study  10/30/20 Labs Lactic acid: 4.4 -> 7.8 WBC: 1.7 INR: 1.1 COVID Negative Blood cultures: drawn x2, results pending  Micro Data:   10/30/20 Blood culture- pending Urine culture- pending  Antimicrobials:   10/30/20- Cefepime for 1 dose 10/30/20- Meropenem  Interim history/subjective:  Intubated, sedated History in HPI per chart and family  Objective   Blood pressure 108/76, pulse (!) 169, temperature (!) 104 F (40 C), temperature source Rectal, resp. rate (!) 49, height 6\' 1"  (1.854 m), weight 109 kg, SpO2 100 %.        Intake/Output Summary (Last 24 hours) at 10/30/2020 0345 Last data filed at 10/30/2020 0222 Gross per 24 hour  Intake 5334.58 ml  Output 275 ml  Net 5059.58 ml   Filed Weights   10/29/20 2320  Weight: 109 kg    Examination: General: Intubated, sedated  HENT: Mucus membranes moist Lungs:  Clear to auscultation bilaterally, without wheezes, moving air well Cardiovascular: RRR, no mrg Abdomen: Soft, mildly  distended Back: bloody drainage from left percutaneous nephrostomy tube Extremities: Lower extremities without edema Neuro: Alert, maintains appropriate conversation    Resolved Hospital Problem list   NA  Assessment & Plan:  Brendan [name] is a 59 yo man with pmhx significant for 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 He presented to OSH ED with nausea, vomiting, fever and blood in his nephrostomy tube that began the day of admission. Imaging in the ED found a renal hematoma.  #ID: Sepsis, concern for urinary source:  -ESBL E. 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 Will hold off fungal coverage unless patient clinically worsens -Hold home cellcept (on cellcept for retroperitoneal fibrosis) in the setting of acute sepsis  #Renal: Displaced Nephrostomy Tube and Renal Hematoma: -Follow up Urology recs -Will need replacement of percutaneous nephrostomy tube with IR -Follow Q6H H&H, currently above baseline  AKI and Chronic Kidney Disease Stage IV: -Cr up to 3.65 from 3.2 in May -Avoid nephrotoxic agents  Retroperitonal Fibrosis -Hold home cellcept during acute sepsis  #Neuro: Prior subdural hematoma s/p craniotomy: -Post operative changes decreasing on this admission's head imaging compared to prior -Continue to monitor  History of Stroke: Residual right sided deficits -Hold home lovenox for now  #Pulmonary: Tachypnea and increased work of breathing: -Pending intubation per OSH ED -CXR clear, no evidence of pneumonia -Could consider chest CTA to evaluate for PE  (patient with history of DVT), however patient has poor kidney function, not ideal for giving contrast and anticoagulation currently contraindicated due to renal hematoma, so will hold off for now.  #Cardiac: Tachycardia: Likely secondary to sepsis -12 lead ECG -Telemetrey monitoring -Sepsis treatment as above  #GI: -Will hold off tube feeds for now given acuity of illness, plan to start as soon as able  #Heme: Anemia: Chronic from CKD, hemoglobin 12.9 on presentation which is better than baseline (likely due to hemoconcentration in the setting of sepsis prior to fluid resusitation). Will follow hemoglobin given presence of renal hematoma to ensure no acute drop -Type and cross -H&H Q6 hours  History of RLE DVT: -Will hold home anticoagulation for now in setting of renal hematoma.   #Endocrine: -Will monitor sugars    Best practice (evaluated daily)   Diet: NPO Pain/Anxiety/Delirium protocol (if indicated): NA VAP protocol (if indicated): NA DVT prophylaxis: Hold in setting of renal hematoma GI prophylaxis: NA Glucose control: Will follow sugars Mobility: PT/OT when able last date of multidisciplinary goals of care discussion: NA, just admitted Family and staff present: NA Summary of discussion: NA Follow up goals of care discussion due: Today Code Status: Full Disposition: ICU status  Labs   CBC: Recent Labs  Lab 10/30/20 0009  WBC 1.7*  NEUTROABS 1.4*  HGB 12.9*  HCT 43.9  MCV 88.5  PLT 400    Basic Metabolic Panel: Recent Labs  Lab 10/30/20 0009  NA 139  K 4.3  CL 105  CO2 23  GLUCOSE 165*  BUN 31*  CREATININE 3.65*  CALCIUM 8.9   GFR: Estimated Creatinine Clearance: 27.5 mL/min (  A) (by C-G formula based on SCr of 3.65 mg/dL (H)). Recent Labs  Lab 10/30/20 0009 10/30/20 0153  WBC 1.7*  --   LATICACIDVEN 4.4* 7.8*    Liver Function Tests: Recent Labs  Lab 10/30/20 0009  AST 28  ALT 28  ALKPHOS 91  BILITOT 0.3  PROT 7.6  ALBUMIN  3.5   Recent Labs  Lab 10/30/20 0009  LIPASE 26   No results for input(s): AMMONIA in the last 168 hours.  ABG    Component Value Date/Time   TCO2 28 04/21/2020 1325     Coagulation Profile: Recent Labs  Lab 10/30/20 0009  INR 1.1    Cardiac Enzymes: No results for input(s): CKTOTAL, CKMB, CKMBINDEX, TROPONINI in the last 168 hours.  HbA1C: Hgb A1c MFr Bld  Date/Time Value Ref Range Status  01/24/2020 05:17 AM 5.0 4.8 - 5.6 % Final    Comment:    (NOTE) Pre diabetes:          5.7%-6.4% Diabetes:              >6.4% Glycemic control for   <7.0% adults with diabetes   01/23/2020 04:32 PM 5.2 4.8 - 5.6 % Final    Comment:    (NOTE) Pre diabetes:          5.7%-6.4% Diabetes:              >6.4% Glycemic control for   <7.0% adults with diabetes     CBG: No results for input(s): GLUCAP in the last 168 hours.  Review of Systems:   Unable to obtain, patient intubated and sedated  Past Medical History  He,  has a past medical history of Acidosis, Acute unilateral obstructive uropathy, Allergic rhinitis, Chronic kidney disease, stage IV (severe) ([place]), Constipation, Coronary atherosclerosis of native coronary artery, Diabetes mellitus without complication ([place]), DVT (deep venous thrombosis) ([place]), Enlarged prostate with urinary obstruction, Essential hypertension, malignant, GERD (gastroesophageal reflux disease), Glaucoma, Hemiparesis affecting right side as late effect of cerebrovascular accident ([place]), Hemiplegia ([place]), Hydronephrosis, Hyperlipidemia, Hypertension, Hypokalemia, Muscle weakness (generalized), Open wound of scalp without complication, PVD (peripheral vascular disease) ([place]), Retroperitoneal fibrosis, Stricture or kinking of ureter, Stroke ([place]), and Vitamin D deficiency.   Surgical History    Past Surgical History:  Procedure Laterality Date  . CRANIOTOMY Left 01/26/2020   Procedure: CRANIOTOMY HEMATOMA EVACUATION SUBDURAL;  Surgeon: Ashok Pall, MD;   Location: [place];  Service: Neurosurgery;  Laterality: Left;  . INTRAVASCULAR ULTRASOUND/IVUS N/A 01/13/2020   Procedure: Intravascular Ultrasound/IVUS;  Surgeon: Serafina Mitchell, MD;  Location: [place] [facility];  Service: Cardiovascular;  Laterality: N/A;  IJ, SVC, IVC  . IR GENERIC HISTORICAL  11/09/2016   IR NEPHROSTOMY EXCHANGE LEFT 11/09/2016 Corrie Mckusick, DO [facility]-INTER[facility] RAD  . IR GENERIC HISTORICAL  01/09/2017   IR NEPHROSTOMY EXCHANGE LEFT 01/09/2017 [name], MD [facility]-INTERV RAD  . IR NEPHROSTOGRAM LEFT THRU EXISTING ACCESS  11/12/2019  . IR NEPHROSTOMY EXCHANGE LEFT  03/06/2017  . IR NEPHROSTOMY EXCHANGE LEFT  04/13/2017  . IR NEPHROSTOMY EXCHANGE LEFT  06/08/2017  . IR NEPHROSTOMY EXCHANGE LEFT  07/20/2017  . IR NEPHROSTOMY EXCHANGE LEFT  08/03/2017  . IR NEPHROSTOMY EXCHANGE LEFT  09/28/2017  . IR NEPHROSTOMY EXCHANGE LEFT  11/29/2017  . IR NEPHROSTOMY EXCHANGE LEFT  01/31/2018  . IR NEPHROSTOMY EXCHANGE LEFT  04/04/2018  . IR NEPHROSTOMY EXCHANGE LEFT  06/27/2018  . IR NEPHROSTOMY EXCHANGE LEFT  09/19/2018  . IR NEPHROSTOMY EXCHANGE LEFT  12/10/2018  .  IR NEPHROSTOMY EXCHANGE LEFT  06/26/2019  . IR NEPHROSTOMY EXCHANGE LEFT  10/16/2019  . IR NEPHROSTOMY EXCHANGE LEFT  01/26/2020  . IR NEPHROSTOMY EXCHANGE LEFT  05/27/2020  . IR NEPHROSTOMY EXCHANGE LEFT  08/19/2020  . IVC VENOGRAPHY N/A 01/13/2020   Procedure: IVC Venography;  Surgeon: Serafina Mitchell, MD;  Location: [place] [facility];  Service: Cardiovascular;  Laterality: N/A;  . LOWER EXTREMITY VENOGRAPHY Left 01/13/2020   Procedure: LOWER EXTREMITY VENOGRAPHY;  Surgeon: Serafina Mitchell, MD;  Location: [facility];  Service: Cardiovascular;  Laterality: Left;  . NEPHROSTOMY TUBE PLACEMENT ([place] HX)    . WOUND EXPLORATION N/A 04/21/2020   Procedure: Wound revision, Scalp;  Surgeon: Ashok Pall, MD;  Location: [place] [facility];  Service: Neurosurgery;  Laterality: N/A;     Social History   reports that he has never smoked. He has never used  smokeless tobacco. He reports previous alcohol use. He reports previous drug use.   Family History   His Family history is unknown by patient.   Allergies Allergies  Allergen Reactions  . Other     TB serum-reaction unknown. Provided via [facility] records      Home Medications  Prior to Admission medications   Medication Sig Start Date End Date Taking? Authorizing Provider  acetaminophen (TYLENOL) 325 MG tablet Take 650 mg by mouth every 8 (eight) hours as needed for moderate pain or headache.    [provider]  Amino Acids-Protein Hydrolys (FEEDING SUPPLEMENT, PRO-STAT SUGAR FREE 64,) LIQD Take 30 mLs by mouth 3 (three) times daily. 02/13/20   Arrien, Jimmy Picket, MD  calcium carbonate (CALTRATE 600) 1500 (600 Ca) MG TABS tablet Take 1 tablet by mouth 2 (two) times daily.     [provider]  cetaphil (CETAPHIL) lotion Apply 1 application topically 2 (two) times daily. Apply to arms, legs, and feet    [provider]  Cholecalciferol 1000 units tablet Take 2,000 Units by mouth daily.     [provider]  ezetimibe (ZETIA) 10 MG tablet Take 10 mg by mouth every evening.     [provider]  famotidine (PEPCID) 20 MG tablet Take 1 tablet (20 mg total) by mouth at bedtime. 02/13/20 04/19/20  Arrien, Jimmy Picket, MD  guaiFENesin (MUCINEX) 600 MG 12 hr tablet Take 600 mg by mouth 2 (two) times daily.    [provider]  heparin 5000 UNIT/ML injection Inject 1 mL (5,000 Units total) into the skin every 8 (eight) hours. 02/13/20 04/19/20  Arrien, Jimmy Picket, MD  ibuprofen (ADVIL) 800 MG tablet Take 1 tablet (800 mg total) by mouth every 8 (eight) hours as needed. 04/21/20   Ashok Pall, MD  loratadine (CLARITIN) 10 MG tablet Take 10 mg by mouth daily.    [provider]  mycophenolate (CELLCEPT) 500 MG tablet Take 500 mg by mouth 2 (two) times daily.    [provider]  polyethylene glycol (MIRALAX / GLYCOLAX) 17 g  packet Take 17 g by mouth daily as needed for mild constipation. 02/13/20   Arrien, Jimmy Picket, MD  senna (SENOKOT) 8.6 MG TABS tablet Take 8.6 mg by mouth daily.     [provider]  simvastatin (ZOCOR) 40 MG tablet Take 40 mg by mouth at bedtime.    [provider]  sodium bicarbonate 650 MG tablet Take 650 mg by mouth 3 (three) times daily.    [provider]  tamsulosin (FLOMAX) 0.4 MG CAPS capsule Take 0.4 mg  by mouth at bedtime.    [provider]     Critical care time: 45 minutes

## 2020-10-30 NOTE — Progress Notes (Signed)
Received call from Dellis Filbert, RN that patient came in Septic and MD wanted to intubate.  We intubated patient and ABG drew right after to see where patient was.  MD made aware of result.  Patient to be transported to [place].  Report called to Belva Chimes, Charge RRT and [facility] here getting patient ready for transport.

## 2020-10-30 NOTE — Procedures (Addendum)
Arterial Catheter Insertion Procedure Note  Brendan [name]  [id number]  15-Mar-1961  Date:10/30/20  Time:8:55 AM    Provider Performing: Marty Heck    Procedure: Insertion of Arterial Line [phone]) with US guidance (43539)   Indication(s) Blood pressure monitoring and/or need for frequent ABGs  Consent Risks of the procedure as well as the alternatives and risks of each were explained to the patient and/or caregiver.  Consent for the procedure was obtained and is signed in the bedside chart  Anesthesia None   Time Out Verified patient identification, verified procedure, site/side was marked, verified correct patient position, special equipment/implants available, medications/allergies/relevant history reviewed, required imaging and test results available.   Sterile Technique Maximal sterile technique including full sterile barrier drape, hand hygiene, sterile gown, sterile gloves, mask, hair covering, sterile ultrasound probe cover (if used).   Procedure Description Area of catheter insertion was cleaned with chlorhexidine and draped in sterile fashion. With real-time ultrasound guidance an arterial catheter was placed into the left radial artery.  Appropriate arterial tracings confirmed on monitor.     Complications/Tolerance None; patient tolerated the procedure well.   EBL Minimal   Specimen(s) None  Brendan [name] A, DO 10/30/2020, 8:56 AM Pager: [phone]

## 2020-10-30 NOTE — Progress Notes (Addendum)
Nearing maximal support. Abx broadened, stress steroids, additional fluid including bicarb given Called sister (POA) and updated regarding grave prognosis. IR to try to get nephrostomy tube exchange if we can get him in stable spot.  Additional 56 minutes cc time spent coordinating care. Erskine Emery MD PCCM

## 2020-10-30 NOTE — Progress Notes (Signed)
Hyperkalemia to 6.5, poor urine output  AKI in setting of CKD, being medically managed with bicarb, insulin + D50, and lokelma CRRT had been planned for morning, however will initiate overnight given worsening renal function Called family who consented to line and dialysis (explained this would likely be long term) Called nephrology for initiation of CRRT

## 2020-10-30 NOTE — Procedures (Signed)
Central Venous Catheter Insertion Procedure Note  Cleon Signorelli  [id number]  Dec 20, 1960  Date:10/30/20  Time:8:53 AM   Provider Performing:Clydell Alberts A Yussuf Sawyers   Procedure: Insertion of Non-tunneled Central Venous [phone]) with US guidance (44010)   Indication(s) Medication administration  Consent Risks of the procedure as well as the alternatives and risks of each were explained to the patient and/or caregiver.  Consent for the procedure was obtained and is signed in the bedside chart  Anesthesia Topical only with 1% lidocaine   Timeout Verified patient identification, verified procedure, site/side was marked, verified correct patient position, special equipment/implants available, medications/allergies/relevant history reviewed, required imaging and test results available.  Sterile Technique Maximal sterile technique including full sterile barrier drape, hand hygiene, sterile gown, sterile gloves, mask, hair covering, sterile ultrasound probe cover (if used).  Procedure Description Area of catheter insertion was cleaned with chlorhexidine and draped in sterile fashion.  With real-time ultrasound guidance a central venous catheter was placed into the right internal jugular vein. Nonpulsatile blood flow and easy flushing noted in all ports.  The catheter was sutured in place and sterile dressing applied.  Complications/Tolerance None; patient tolerated the procedure well. Chest X-ray is ordered to verify placement for internal jugular or subclavian cannulation.   Chest x-ray is not ordered for femoral cannulation.  EBL Minimal  Specimen(s) None  Linzey Ramser A, DO 10/30/2020, 8:54 AM Pager: [phone]

## 2020-10-30 NOTE — Progress Notes (Signed)
eLink Physician-Brief Progress Note [name] DOB: [birth year] MRN: [id number]   Date of Service  10/30/2020  HPI/Events of Note  K 6.5. HCO3 now 14.  eICU Interventions  Give bicarb amp, increase bicarb drip to 175cc/hr, give 5u insulin IV + 1 amp of D50, start lokelma 10mg  Q8H per tube.     Intervention Category Intermediate Interventions: Electrolyte abnormality - evaluation and management  [name] [name] 10/30/2020, 9:02 PM

## 2020-10-30 NOTE — Procedures (Signed)
Interventional Radiology Procedure Note  Procedure:  1) Fluoroscopic guided nephrostomy tube check 2) Attempted fluoroscopic guided nephrostomy track recanalization 3) Attempted CT guided percutaneous nephrostomy tube placement 4) Limited CT IVP  Findings: Please refer to procedural dictation for full description.  Indwelling nephrostomy in perirenal space.  Unable to recanalize established nephrostomy track with catheter and wire.  Unable to visualize kidney sonographically.  Patient transferred to CT for attempted percutaneous nephrostomy replacement.  Multiple 22 ga Chiba needle passes with inability to gain access to collecting system.  CT IVP performed (after discussion with Dr. Tamala Julian, ICU permitting use of IV contrast in light of renal failure) with very minimal renal parenchymal uptake and zero excretion into collecting system.  Given small pericapsular hematoma, drain placement would be of no benefit.  The procedure was aborted at this time.  Complications: None immediate  Estimated Blood Loss: <5 mL  Recommendations: Repeat CT abdomen/pelvis in 12-24 hours to assess size of perirenal hematoma and presence of contrast in collecting system/any evidence of obstructive uropathy.  This may provide target for potential nephrostomy if needed.    IR will continue to follow.   Ruthann Cancer, MD Pager: [phone]

## 2020-10-30 NOTE — ED Notes (Addendum)
First set of blood cultures were drawn before antibiotics were given but difficulty obtaining second set due to difficult stick. Antibiotic was given before second set of cultures were drawn.

## 2020-10-30 NOTE — Progress Notes (Signed)
Pharmacy Antibiotic Note  Brendan [name] is a 59 y.o. male admitted on 10/29/2020 with sepsis with possible urinary source and remote h/o ESBL E.coli.  Pharmacy has been consulted for Vancomycin in addition to Merrem dosing. MRSA PCR negative. Recent nephrostomy tube exchange in September.   Plan: Add Vancomycin 2500 mg IV x1 now Will monitor renal function to schedule further doses Continue Merrem 1g IV Q12H.  Height: 6\' 1"  (185.4 cm) Weight: 113.4 kg (250 lb) IBW/kg (Calculated) : 79.9  Temp (24hrs), Avg:101.5 F (38.6 C), Min:99.5 F (37.5 C), Max:104 F (40 C)  Recent Labs  Lab 10/30/20 0009 10/30/20 0153  WBC 1.7*  --   CREATININE 3.65*  --   LATICACIDVEN 4.4* 7.8*    Estimated Creatinine Clearance: 28 mL/min (A) (by C-G formula based on SCr of 3.65 mg/dL (H)).    Allergies  Allergen Reactions  . Other     TB serum-reaction unknown. Provided via [facility] records     Thank you for allowing pharmacy to be a part of this patient's care.  Sloan Leiter, PharmD, BCPS, BCCCP Clinical Pharmacist Please refer to [facility] for [place] numbers 10/30/2020 11:14 AM

## 2020-10-30 NOTE — Progress Notes (Signed)
Returned from [employer] and Ct where pt was off unit in attempt to establish nephrostomy tube to no avail. Was off unit for over 3.5 hours. Pt line rezeroed and BP HR stable. K. Radio producer.

## 2020-10-31 ENCOUNTER — Inpatient Hospital Stay (HOSPITAL_COMMUNITY): Payer: Medicaid Other

## 2020-10-31 DIAGNOSIS — N3001 Acute cystitis with hematuria: Secondary | ICD-10-CM

## 2020-10-31 DIAGNOSIS — S37012A Minor contusion of left kidney, initial encounter: Secondary | ICD-10-CM | POA: Diagnosis not present

## 2020-10-31 DIAGNOSIS — N179 Acute kidney failure, unspecified: Secondary | ICD-10-CM | POA: Diagnosis not present

## 2020-10-31 DIAGNOSIS — A419 Sepsis, unspecified organism: Secondary | ICD-10-CM | POA: Diagnosis not present

## 2020-10-31 DIAGNOSIS — I38 Endocarditis, valve unspecified: Secondary | ICD-10-CM | POA: Diagnosis not present

## 2020-10-31 DIAGNOSIS — A4181 Sepsis due to Enterococcus: Secondary | ICD-10-CM

## 2020-10-31 DIAGNOSIS — R6521 Severe sepsis with septic shock: Secondary | ICD-10-CM | POA: Diagnosis not present

## 2020-10-31 LAB — POCT I-STAT 7, (LYTES, BLD GAS, ICA,H+H)
Acid-base deficit: 8 mmol/L — ABNORMAL HIGH (ref 0.0–2.0)
Bicarbonate: 16 mmol/L — ABNORMAL LOW (ref 20.0–28.0)
Calcium, Ion: 1.03 mmol/L — ABNORMAL LOW (ref 1.15–1.40)
HCT: 23 % — ABNORMAL LOW (ref 39.0–52.0)
Hemoglobin: 7.8 g/dL — ABNORMAL LOW (ref 13.0–17.0)
O2 Saturation: 98 %
Patient temperature: 98.7
Potassium: 6.6 mmol/L (ref 3.5–5.1)
Sodium: 136 mmol/L (ref 135–145)
TCO2: 17 mmol/L — ABNORMAL LOW (ref 22–32)
pCO2 arterial: 25.6 mmHg — ABNORMAL LOW (ref 32.0–48.0)
pH, Arterial: 7.403 (ref 7.350–7.450)
pO2, Arterial: 111 mmHg — ABNORMAL HIGH (ref 83.0–108.0)

## 2020-10-31 LAB — CBC WITH DIFFERENTIAL/PLATELET
Abs Immature Granulocytes: 0.5 10*3/uL — ABNORMAL HIGH (ref 0.00–0.07)
Basophils Absolute: 0 10*3/uL (ref 0.0–0.1)
Basophils Relative: 0 %
Eosinophils Absolute: 0 10*3/uL (ref 0.0–0.5)
Eosinophils Relative: 0 %
HCT: 24.1 % — ABNORMAL LOW (ref 39.0–52.0)
Hemoglobin: 7.5 g/dL — ABNORMAL LOW (ref 13.0–17.0)
Immature Granulocytes: 2 %
Lymphocytes Relative: 4 %
Lymphs Abs: 0.9 10*3/uL (ref 0.7–4.0)
MCH: 26.4 pg (ref 26.0–34.0)
MCHC: 31.1 g/dL (ref 30.0–36.0)
MCV: 84.9 fL (ref 80.0–100.0)
Monocytes Absolute: 0.7 10*3/uL (ref 0.1–1.0)
Monocytes Relative: 3 %
Neutro Abs: 19.8 10*3/uL — ABNORMAL HIGH (ref 1.7–7.7)
Neutrophils Relative %: 91 %
Platelets: 88 10*3/uL — ABNORMAL LOW (ref 150–400)
RBC: 2.84 MIL/uL — ABNORMAL LOW (ref 4.22–5.81)
RDW: 17.3 % — ABNORMAL HIGH (ref 11.5–15.5)
WBC: 21.9 10*3/uL — ABNORMAL HIGH (ref 4.0–10.5)
nRBC: 0.1 % (ref 0.0–0.2)

## 2020-10-31 LAB — BLOOD CULTURE ID PANEL (REFLEXED) - BCID2

## 2020-10-31 LAB — POCT I-STAT EG7
Acid-Base Excess: 0 mmol/L (ref 0.0–2.0)
Bicarbonate: 24.4 mmol/L (ref 20.0–28.0)
Calcium, Ion: 0.99 mmol/L — ABNORMAL LOW (ref 1.15–1.40)
HCT: 22 % — ABNORMAL LOW (ref 39.0–52.0)
Hemoglobin: 7.5 g/dL — ABNORMAL LOW (ref 13.0–17.0)
O2 Saturation: 50 %
Patient temperature: 97.4
Potassium: 5.4 mmol/L — ABNORMAL HIGH (ref 3.5–5.1)
Sodium: 137 mmol/L (ref 135–145)
TCO2: 25 mmol/L (ref 22–32)
pCO2, Ven: 33.8 mmHg — ABNORMAL LOW (ref 44.0–60.0)
pH, Ven: 7.464 — ABNORMAL HIGH (ref 7.250–7.430)
pO2, Ven: 24 mmHg — CL (ref 32.0–45.0)

## 2020-10-31 LAB — COMPREHENSIVE METABOLIC PANEL
ALT: 44 U/L (ref 0–44)
AST: 82 U/L — ABNORMAL HIGH (ref 15–41)
Albumin: 2.9 g/dL — ABNORMAL LOW (ref 3.5–5.0)
Alkaline Phosphatase: 36 U/L — ABNORMAL LOW (ref 38–126)
Anion gap: 18 — ABNORMAL HIGH (ref 5–15)
BUN: 31 mg/dL — ABNORMAL HIGH (ref 8–23)
CO2: 18 mmol/L — ABNORMAL LOW (ref 22–32)
Calcium: 7.4 mg/dL — ABNORMAL LOW (ref 8.9–10.3)
Chloride: 100 mmol/L (ref 98–111)
Creatinine, Ser: 4.36 mg/dL — ABNORMAL HIGH (ref 0.61–1.24)
GFR, Estimated: 15 mL/min — ABNORMAL LOW (ref >60)
Glucose, Bld: 260 mg/dL — ABNORMAL HIGH (ref 70–99)
Potassium: 5.9 mmol/L — ABNORMAL HIGH (ref 3.5–5.1)
Sodium: 136 mmol/L (ref 135–145)
Total Bilirubin: 1.4 mg/dL — ABNORMAL HIGH (ref 0.3–1.2)
Total Protein: 5.5 g/dL — ABNORMAL LOW (ref 6.5–8.1)

## 2020-10-31 LAB — ECHOCARDIOGRAM COMPLETE
Area-P 1/2: 3.17 cm2
Height: 73 in
S' Lateral: 2.5 cm
Weight: 4426.84 oz

## 2020-10-31 LAB — DIC (DISSEMINATED INTRAVASCULAR COAGULATION)PANEL
D-Dimer, Quant: >20 ug/mL-FEU — ABNORMAL HIGH (ref 0.00–0.50)
Fibrinogen: 434 mg/dL (ref 210–475)
INR: 1.9 — ABNORMAL HIGH (ref 0.8–1.2)
Platelets: 73 10*3/uL — ABNORMAL LOW (ref 150–400)
Prothrombin Time: 21 seconds — ABNORMAL HIGH (ref 11.4–15.2)
Smear Review: NONE SEEN
aPTT: 46 seconds — ABNORMAL HIGH (ref 24–36)

## 2020-10-31 LAB — HEMOGLOBIN AND HEMATOCRIT, BLOOD
HCT: 22.9 % — ABNORMAL LOW (ref 39.0–52.0)
HCT: 21.9 % — ABNORMAL LOW (ref 39.0–52.0)
HCT: 21.8 % — ABNORMAL LOW (ref 39.0–52.0)
Hemoglobin: 7.4 g/dL — ABNORMAL LOW (ref 13.0–17.0)
Hemoglobin: 7.1 g/dL — ABNORMAL LOW (ref 13.0–17.0)
Hemoglobin: 7.2 g/dL — ABNORMAL LOW (ref 13.0–17.0)

## 2020-10-31 LAB — RENAL FUNCTION PANEL
Albumin: 3.2 g/dL — ABNORMAL LOW (ref 3.5–5.0)
Anion gap: 15 (ref 5–15)
BUN: 21 mg/dL (ref 8–23)
CO2: 23 mmol/L (ref 22–32)
Calcium: 7.6 mg/dL — ABNORMAL LOW (ref 8.9–10.3)
Chloride: 98 mmol/L (ref 98–111)
Creatinine, Ser: 2.98 mg/dL — ABNORMAL HIGH (ref 0.61–1.24)
GFR, Estimated: 23 mL/min — ABNORMAL LOW (ref >60)
Glucose, Bld: 156 mg/dL — ABNORMAL HIGH (ref 70–99)
Phosphorus: 2 mg/dL — ABNORMAL LOW (ref 2.5–4.6)
Potassium: 3.8 mmol/L (ref 3.5–5.1)
Sodium: 136 mmol/L (ref 135–145)

## 2020-10-31 LAB — GLUCOSE, CAPILLARY
Glucose-Capillary: 233 mg/dL — ABNORMAL HIGH (ref 70–99)
Glucose-Capillary: 214 mg/dL — ABNORMAL HIGH (ref 70–99)
Glucose-Capillary: 169 mg/dL — ABNORMAL HIGH (ref 70–99)
Glucose-Capillary: 150 mg/dL — ABNORMAL HIGH (ref 70–99)
Glucose-Capillary: 127 mg/dL — ABNORMAL HIGH (ref 70–99)
Glucose-Capillary: 182 mg/dL — ABNORMAL HIGH (ref 70–99)

## 2020-10-31 LAB — VANCOMYCIN, TROUGH: Vancomycin Tr: 22 ug/mL (ref 15–20)

## 2020-10-31 LAB — PHOSPHORUS: Phosphorus: 3 mg/dL (ref 2.5–4.6)

## 2020-10-31 LAB — MAGNESIUM: Magnesium: 1.4 mg/dL — ABNORMAL LOW (ref 1.7–2.4)

## 2020-10-31 LAB — LACTIC ACID, PLASMA: Lactic Acid, Venous: 7.4 mmol/L (ref 0.5–1.9)

## 2020-10-31 MED ORDER — INSULIN ASPART 100 UNIT/ML ~~LOC~~ SOLN
0.0000 [IU] | SUBCUTANEOUS | Status: DC
  Administered 2020-10-31: 1 [IU] via SUBCUTANEOUS
  Administered 2020-10-31: 2 [IU] via SUBCUTANEOUS
  Administered 2020-10-31: 1 [IU] via SUBCUTANEOUS
  Administered 2020-11-01 (×2): 2 [IU] via SUBCUTANEOUS
  Administered 2020-11-01 (×3): 1 [IU] via SUBCUTANEOUS
  Administered 2020-11-02 (×2): 3 [IU] via SUBCUTANEOUS

## 2020-10-31 MED ORDER — PIPERACILLIN-TAZOBACTAM 3.375 G IVPB 30 MIN
3.3750 g | Freq: Four times a day (QID) | INTRAVENOUS | Status: DC
  Administered 2020-10-31 – 2020-11-01 (×4): 3.375 g via INTRAVENOUS
  Filled 2020-10-31 (×5): qty 50

## 2020-10-31 MED ORDER — PANTOPRAZOLE SODIUM 40 MG IV SOLR
40.0000 mg | Freq: Every day | INTRAVENOUS | Status: DC
  Administered 2020-10-31: 40 mg via INTRAVENOUS
  Filled 2020-10-31: qty 40

## 2020-10-31 MED ORDER — PROSOURCE TF PO LIQD
45.0000 mL | Freq: Two times a day (BID) | ORAL | Status: DC
  Administered 2020-10-31 – 2020-11-01 (×3): 45 mL
  Filled 2020-10-31 (×3): qty 45

## 2020-10-31 MED ORDER — MAGNESIUM SULFATE 4 GM/100ML IV SOLN
4.0000 g | Freq: Once | INTRAVENOUS | Status: AC
  Administered 2020-10-31: 4 g via INTRAVENOUS
  Filled 2020-10-31: qty 100

## 2020-10-31 MED ORDER — MIDAZOLAM 50MG/50ML (1MG/ML) PREMIX INFUSION
0.5000 mg/h | INTRAVENOUS | Status: DC
  Administered 2020-10-31: 0.5 mg/h via INTRAVENOUS
  Administered 2020-10-31 (×3): 8 mg/h via INTRAVENOUS
  Administered 2020-11-01: 4 mg/h via INTRAVENOUS
  Administered 2020-11-01: 8 mg/h via INTRAVENOUS
  Filled 2020-10-31 (×6): qty 50

## 2020-10-31 MED ORDER — SODIUM PHOSPHATES 45 MMOLE/15ML IV SOLN
30.0000 mmol | Freq: Once | INTRAVENOUS | Status: AC
  Administered 2020-10-31: 30 mmol via INTRAVENOUS
  Filled 2020-10-31: qty 10

## 2020-10-31 MED ORDER — SODIUM ZIRCONIUM CYCLOSILICATE 5 G PO PACK
10.0000 g | PACK | Freq: Three times a day (TID) | ORAL | Status: DC
  Administered 2020-10-31: 10 g via ORAL
  Filled 2020-10-31: qty 2

## 2020-10-31 MED ORDER — SODIUM ZIRCONIUM CYCLOSILICATE 5 G PO PACK
10.0000 g | PACK | Freq: Three times a day (TID) | ORAL | Status: DC
  Administered 2020-10-31: 10 g
  Filled 2020-10-31: qty 2

## 2020-10-31 MED ORDER — INSULIN DETEMIR 100 UNIT/ML ~~LOC~~ SOLN
10.0000 [IU] | Freq: Two times a day (BID) | SUBCUTANEOUS | Status: DC
  Administered 2020-10-31 – 2020-11-04 (×7): 10 [IU] via SUBCUTANEOUS
  Filled 2020-10-31 (×10): qty 0.1

## 2020-10-31 MED ORDER — STERILE WATER FOR INJECTION IV SOLN
INTRAVENOUS | Status: DC
  Filled 2020-10-31 (×6): qty 850

## 2020-10-31 MED ORDER — VANCOMYCIN HCL 1250 MG/250ML IV SOLN
1250.0000 mg | INTRAVENOUS | Status: DC
  Filled 2020-10-31: qty 250

## 2020-10-31 MED ORDER — VITAL HIGH PROTEIN PO LIQD
1000.0000 mL | ORAL | Status: DC
  Administered 2020-10-31: 1000 mL
  Filled 2020-10-31: qty 1000

## 2020-10-31 MED ORDER — SODIUM CHLORIDE 0.9 % IV SOLN
2000.0000 mg | Freq: Once | INTRAVENOUS | Status: AC
  Administered 2020-10-31: 2000 mg via INTRAVENOUS
  Filled 2020-10-31: qty 20

## 2020-10-31 MED ORDER — POTASSIUM PHOSPHATES 15 MMOLE/5ML IV SOLN: 30.0000 mmol | Freq: Once | INTRAVENOUS | Status: DC

## 2020-10-31 NOTE — Consult Note (Signed)
Nephrology Consult   Requesting provider: [facility] requesting consult: Critical care Reason for consult: Acute renal failure   Assessment/Recommendations: Brendan [name] is a/an 59 y.o. 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  Obstruction and hematoma likely also contributing -Continue CRRT, seen on the machine.  See procedure note for details -Obstruction management as below -Maintain MAP>65 for optimal renal perfusion.  -Avoid nephrotoxic medications including NSAIDs and Vanc/Zosyn combo -Given CKD his prognosis regarding renal recovery is guarded but possible  Septic shock/E faecalis bacteremia: Likely urinary source.  History of MDR organisms.  Antibiotics per primary team and infectious disease.  Continue pressor support to aid in renal perfusion  Retroperitoneal fibrosis/acute obstruction: Chronic issue of fibrosis including the aorta and ureters requiring nephrostomy tubes.  Current nephrostomy tube likely not functional with difficult procedure yesterday.  Urology and IR following.  Appreciate help  Hyperkalemia: Secondary to renal failure.  Improved with CRRT.  Continue  Anemia renal hematoma: Multifactorial but blood loss contributing on anticoagulation.  Management per urology and primary team.  Transfuse as needed  Hypomagnesemia: IV magnesium repletion per primary team   Recommendations conveyed to primary service.    [facility] 10/31/2020 8:59 AM   _____________________________________________________________________________________ CC: Acute  Renal failure  History of Present Illness: Brendan [name] is a/an 59 y.o. 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  Patient was intubated and sedated so history was obtained per chart review.  Patient presented on 10/30/2020 with nausea, vomiting, fevers, blood in his nephrostomy tube.  On arrival to the emergency department he appeared septic with tachycardia, fever, intermittent hypotension.  He received fluids as well as antibiotics.  CT abdomen pelvis showed possible displacement of his nephrostomy tube as well as a left renal hematoma.  Urology was consulted who recommended reversal of his anticoagulation and IR was consulted for nephrostomy tube replacement.  Unfortunately the procedure was difficult and his nephrostomy tube was not able to be replaced.  The patient was intubated and started on pressors due to significant hypotension.  Urine output has been minimal since arrival.  The patient had significant hyperkalemia with EKG changes last night and was placed on CRRT emergently.  His potassium is improved today.  He remains acutely ill.  He has possibly had a seizure and neurology is consulted.  His blood cultures came back positive for E faecalis.   Medications:  Current Facility-Administered Medications  Medication Dose Route Frequency Provider Last Rate Last Admin  . 0.9 %  sodium chloride infusion  250 mL Intravenous Continuous Jorje Guild, MD   Stopped at 10/30/20 1109  . 0.9 %  sodium chloride infusion  250 mL Intravenous Continuous Ripley Fraise, MD      . docusate (COLACE) 50 MG/5ML liquid 100 mg  100 mg Per Tube BID Candee Furbish, MD   100 mg at 10/30/20 2146  . famotidine (PEPCID) IVPB 20 mg premix  20 mg Intravenous Q24H Candee Furbish, MD   Stopped at 10/30/20  1136  . feeding supplement (PROSource TF) liquid 45 mL  45 mL Per Tube BID Candee Furbish, MD      . feeding supplement (VITAL HIGH PROTEIN) liquid 1,000 mL  1,000 mL Per Tube Q24H Candee Furbish, MD      . fentaNYL (SUBLIMAZE) bolus via infusion 50 mcg  50 mcg Intravenous Q15 min PRN Candee Furbish, MD      . fentaNYL 2521mcg in NS 264mL (13mcg/ml) infusion-PREMIX  50-200 mcg/hr Intravenous Continuous Candee Furbish, MD 20 mL/hr at 10/31/20 0800 200 mcg/hr at 10/31/20 0800  . heparin injection 1,000-6,000 Units  1,000-6,000 Units CRRT PRN Reesa Chew, MD   3,800 Units at 10/30/20 2252  . hydrocortisone sodium succinate (SOLU-CORTEF) 100 MG injection 50 mg  50 mg Intravenous Q6H Candee Furbish, MD   50 mg at 10/31/20 0323  . insulin aspart (novoLOG) injection 0-6 Units  0-6 Units Subcutaneous Q4H Candee Furbish, MD   2 Units at 10/31/20 [phone]  . insulin detemir (LEVEMIR) injection 10 Units  10 Units Subcutaneous BID Candee Furbish, MD      . iohexol (OMNIPAQUE) 300 MG/ML solution 100 mL  100 mL Per Tube Once PRN Suttle, Rosanne Ashing, MD      . lidocaine (XYLOCAINE) 1 % (with pres) injection   Infiltration PRN Suttle, Rosanne Ashing, MD   5 mL at 10/30/20 1340  . magnesium sulfate IVPB 4 g 100 mL  4 g Intravenous Once Candee Furbish, MD 50 mL/hr at 10/31/20 0753 4 g at 10/31/20 0753  . MEDLINE mouth rinse  15 mL Mouth Rinse 10 times per day Candee Furbish, MD   15 mL at 10/31/20 0558  . meropenem (MERREM) 1 g in sodium chloride 0.9 % 100 mL IVPB  1 g Intravenous Q8H Laren Everts, RPH   Stopped at 10/31/20 0602  . midazolam (VERSED) 50 mg/50 mL (1 mg/mL) premix infusion  0.5-10 mg/hr Intravenous Continuous Stretch, [name], MD 8 mL/hr at 10/31/20 0852 8 mg/hr at 10/31/20 0852  . midazolam (VERSED) injection 2 mg  2 mg Intravenous Q2H PRN Candee Furbish, MD      . midazolam (VERSED) injection   Intravenous PRN Suzette Battiest, MD   0.5 mg at 10/30/20 1506  . norepinephrine (LEVOPHED) 16 mg in  228mL premix infusion  2-35 mcg/min Intravenous Titrated Candee Furbish, MD 7.5 mL/hr at 10/31/20 0800 8 mcg/min at 10/31/20 0800  . pantoprazole (PROTONIX) injection 40 mg  40 mg Intravenous QHS Candee Furbish, MD      . phenylephrine CONCENTRATED 100mg  in sodium chloride 0.9% 229mL (0.4mg /mL) infusion  0-400 mcg/min Intravenous Titrated Candee Furbish, MD 22.5 mL/hr at 10/31/20 0821 150 mcg/min at 10/31/20 0821  . polyethylene glycol (MIRALAX / GLYCOLAX) packet 17 g  17 g Per Tube Daily Candee Furbish, MD      . prismasol BGK 2/2.5 dialysis solution   CRRT Continuous Reesa Chew, MD 2,000 mL/hr at 10/31/20 0658 New Bag at 10/31/20 7858  . prismasol BGK 2/2.5 replacement solution   CRRT Continuous Reesa Chew, MD 500 mL/hr at 10/30/20 2355 New Bag at 10/30/20 2355  . prismasol BGK 2/2.5 replacement solution   CRRT Continuous Reesa Chew, MD 300 mL/hr at 10/30/20 2354 New Bag at 10/30/20 2354  . sodium bicarbonate 150 mEq in sterile water 1,000 mL infusion   Intravenous Continuous Candee Furbish, MD      .  sodium chloride 0.9 % primer fluid for CRRT  500 mL CRRT PRN Reesa Chew, MD      . sodium zirconium cyclosilicate (LOKELMA) packet 10 g  10 g Oral TID Candee Furbish, MD   10 g at 10/31/20 0834  . vancomycin (VANCOREADY) IVPB 1250 mg/250 mL  1,250 mg Intravenous Q24H Priscella Mann, RPH      . vasopressin (PITRESSIN) 20 Units in sodium chloride 0.9 % 100 mL infusion-*FOR SHOCK*  0-0.04 Units/min Intravenous Continuous Candee Furbish, MD   Stopped at 10/31/20 [phone]     ALLERGIES Chlorhexidine and Other  MEDICAL HISTORY Past Medical History:  Diagnosis Date  . Acidosis   . Acute unilateral obstructive uropathy   . Allergic rhinitis   . Chronic kidney disease, stage IV (severe) ([place])   . Constipation   . Coronary atherosclerosis of native coronary artery   . Diabetes mellitus without complication ([place])   . DVT (deep venous thrombosis) ([place])   . Enlarged  prostate with urinary obstruction   . Essential hypertension, malignant   . GERD (gastroesophageal reflux disease)   . Glaucoma   . Hemiparesis affecting right side as late effect of cerebrovascular accident ([place])   . Hemiplegia ([place])   . Hydronephrosis   . Hyperlipidemia   . Hypertension   . Hypokalemia   . Muscle weakness (generalized)   . Open wound of scalp without complication   . PVD (peripheral vascular disease) ([place])   . Retroperitoneal fibrosis   . Stricture or kinking of ureter   . Stroke [facility])    right sided hemiparesis  . Vitamin D deficiency      SOCIAL HISTORY Social History   Socioeconomic History  . Marital status: Single    Spouse name: Not on file  . Number of children: Not on file  . Years of education: Not on file  . Highest education level: Not on file  Occupational History  . Not on file  Tobacco Use  . Smoking status: Never Smoker  . Smokeless tobacco: Never Used  Vaping Use  . Vaping Use: Never used  Substance and Sexual Activity  . Alcohol use: Not Currently  . Drug use: Not Currently  . Sexual activity: Not Currently  Other Topics Concern  . Not on file  Social History Narrative  . Not on file   Social Determinants of Health   Financial Resource Strain:   . Difficulty of Paying Living Expenses: Not on file  Food Insecurity:   . Worried About Charity fundraiser in the Last Year: Not on file  . Ran Out of Food in the Last Year: Not on file  Transportation Needs:   . Lack of Transportation (Medical): Not on file  . Lack of Transportation (Non-Medical): Not on file  Physical Activity:   . Days of Exercise per Week: Not on file  . Minutes of Exercise per Session: Not on file  Stress:   . Feeling of Stress : Not on file  Social Connections:   . Frequency of Communication with Friends and Family: Not on file  . Frequency of Social Gatherings with Friends and Family: Not on file  . Attends Religious Services: Not on file  . Active  Member of Clubs or Organizations: Not on file  . Attends Archivist Meetings: Not on file  . Marital Status: Not on file  Intimate Partner Violence:   . Fear of Current or Ex-Partner: Not on file  . Emotionally  Abused: Not on file  . Physically Abused: Not on file  . Sexually Abused: Not on file     FAMILY HISTORY Family History  Family history unknown: Yes      Review of Systems: Unable to obtain review of systems to the patient's sedation  Physical Exam: Vitals:   10/31/20 0723 10/31/20 0745  BP:    Pulse:    Resp:    Temp: (!) 97.4 F (36.3 C)   SpO2:  100%   Total I/O In: 251.2 [I.V.:225.2; IV Piggyback:26] Out: 234 [Other:234]  Intake/Output Summary (Last 24 hours) at 10/31/2020 0859 Last data filed at 10/31/2020 0800 Gross per 24 hour  Intake 7510.02 ml  Output 1527 ml  Net 5983.02 ml   General: Ill-appearing, lying in bed, sedated HEENT: No nasal discharge, normocephalic atraumatic, ET tube in place CV: No audible murmur, trace bilateral lower extremity edema Lungs: Coarse bilateral breath sounds, bilateral chest rise, ventilated Abd: soft, non-tender, non-distended Skin: no visible lesions or rashes Psych: Unable to assess mood and affect due to sedation Musculoskeletal: no obvious deformities Neuro: Sedated, comfortable, does not respond to questioning  Test Results Reviewed Lab Results  Component Value Date   NA 137 10/31/2020   K 5.4 (H) 10/31/2020   CL 100 10/31/2020   CO2 18 (L) 10/31/2020   BUN 31 (H) 10/31/2020   CREATININE 4.36 (H) 10/31/2020   CALCIUM 7.4 (L) 10/31/2020   ALBUMIN 2.9 (L) 10/31/2020   PHOS 3.0 10/31/2020     I have reviewed all relevant outside healthcare records related to the patient's current hospitalization

## 2020-10-31 NOTE — Progress Notes (Signed)
*  PRELIMINARY RESULTS* Echocardiogram 2D Echocardiogram has been performed.  Samuel Germany 10/31/2020, 1:21 PM

## 2020-10-31 NOTE — Progress Notes (Signed)
[facility] Progress Note [name] DOB: December 05, 1960 MRN: [id number]   Date of Service  10/31/2020  HPI/Events of Note  Called to evaluate the patient for suspected seizure. Patient not responding to voice/command (he was doing so earlier) and he has rightward and upward gaze deviation on exam. Subtle shaking of upper body per RN though I can't appreciate on camera. Normal tone in UEs/LEs per RN.   eICU Interventions  Given 2mg  versed IV push with improvement of gaze deviation. Will give another 2mg  IV push now and start versed infusion.  May benefit from EEG / Neuro c/s in AM, though I suspect the seizures are being triggered in context of his severe metabolic disturbances for which he has been started on CRRT this evening.      Intervention Category Major Interventions: Seizures - evaluation and management  [name] Keeana Pieratt 10/31/2020, 1:13 AM

## 2020-10-31 NOTE — Consult Note (Signed)
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 docusate  100 mg Per Tube BID  . feeding supplement (PROSource TF)  45 mL Per Tube BID  . feeding supplement (VITAL HIGH PROTEIN)  1,000 mL Per Tube Q24H  . hydrocortisone sod succinate (SOLU-CORTEF) inj  50 mg Intravenous Q6H  . insulin aspart  0-6 Units Subcutaneous Q4H  . mouth rinse  15 mL Mouth Rinse 10 times per day  . pantoprazole (PROTONIX) IV  40 mg Intravenous QHS  . polyethylene glycol  17 g Per Tube Daily  . sodium zirconium cyclosilicate  10 g Oral TID    Continuous Infusions: . sodium chloride Stopped (10/30/20 1109)  . sodium chloride    . angiotensin II (GIAPREZA) infusion Stopped (10/31/20 0248)  . famotidine (PEPCID) IV Stopped (10/30/20 1136)  . fentaNYL infusion INTRAVENOUS 200 mcg/hr (10/31/20 0800)  . magnesium sulfate bolus IVPB 4 g (10/31/20 0753)  . meropenem (MERREM) IV Stopped (10/31/20 0602)  . midazolam 8 mg/hr (10/31/20 0800)  . norepinephrine (LEVOPHED) Adult infusion 8 mcg/min (10/31/20 0800)  . phenylephrine (NEO-SYNEPHRINE) Adult infusion  150 mcg/min (10/31/20 0800)  . prismasol BGK 2/2.5 dialysis solution 2,000 mL/hr at 10/31/20 0658  . prismasol BGK 2/2.5 replacement solution 500 mL/hr at 10/30/20 2355  . prismasol BGK 2/2.5 replacement solution 300 mL/hr at 10/30/20 2354  . sodium bicarbonate (isotonic) 150 mEq in D5W 1000 mL infusion 175 mL/hr at 10/31/20 0800  . vancomycin    . vasopressin Stopped (10/31/20 0647)    PRN Meds: fentaNYL, heparin, iohexol, lidocaine, midazolam, midazolam, sodium chloride  HPI:    Brendan [name] is a 59 y.o. 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  He had left nephrostomy tube since 2017 and was most recently exchanged 08/19/20.  Per IR notes, right sided tube was removed in 2019 as it was no longer needed.  Routine exchange next scheduled for 11/08/20.  Presented to ED on 11/27 with n/v and blood in tube.  Found to be in septic shock with blood cultures positive for E faecalis.  Currently on vancomycin and meropenem and remains critically ill in shock.   CT abd/pelvis 11/27 revealed that left tube had been withdrawn from renal pelvis and there was a prominent left renal hematoma with stranding and hematoma in the pararenal spaces.  IR attempted to reposition/replace nephrostomy tube without success and recommended repeat CT 12-24 hrs later to assess size of hematoma and presence of contrast in collecting system/any evidence of obstructive uropathy.  Overnight, he developed  worsening electrolyte imbalance and was started on CRRT and a CVC HD catheter was inserted.  Repeat CT his morning showed mild increase in left kidney subscapular hematoma (10.7 x 7.1 cm) with signs of progressive hemoperitoneum.   Past Medical History:  Diagnosis Date  . Acidosis   . Acute unilateral obstructive uropathy   . Allergic rhinitis   . Chronic kidney disease, stage IV (severe) ([place])     . Constipation   . Coronary atherosclerosis of native coronary artery   . Diabetes mellitus without complication ([place])   . DVT (deep venous thrombosis) ([place])   . Enlarged prostate with urinary obstruction   . Essential hypertension, malignant   . GERD (gastroesophageal reflux disease)   . Glaucoma   . Hemiparesis affecting right side as late effect of cerebrovascular accident ([place])   . Hemiplegia ([place])   . Hydronephrosis   . Hyperlipidemia   . Hypertension   . Hypokalemia   . Muscle weakness (generalized)   . Open wound of scalp without complication   . PVD (peripheral vascular disease) ([place])   . Retroperitoneal fibrosis   . Stricture or kinking of ureter   . Stroke [facility])    right sided hemiparesis  . Vitamin D deficiency     Social History   Tobacco Use  . Smoking status: Never Smoker  . Smokeless tobacco: Never Used  Vaping Use  . Vaping Use: Never used  Substance Use Topics  . Alcohol use: Not Currently  . Drug use: Not Currently    Family History  Family history unknown: Yes    Allergies  Allergen Reactions  . Chlorhexidine   . Other     TB serum-reaction unknown. Provided via [facility] records     Review of Systems  Unable to perform ROS: Intubated     OBJECTIVE:   Blood pressure (!) 77/38, pulse 77, temperature (!) 97.4 F (36.3 C), temperature source Axillary, resp. rate (!) 26, height 6\' 1"  (1.854 m), weight 125.5 kg, SpO2 100 %. Body mass index is 36.5 kg/m.  Physical Exam  General: Patient is currently intubated and sedated on the ventilator. Head: Normocephalic and atraumatic.  ET tube in place Neck: Supple, right and left central lines Cardiovascular: RRR Pulmonary/Chest: Breathing assisted on vent; coarse breath sounds anteriorly. Abdominal: Soft, non-tender, non-distended Musculoskeletal: No joint deformities Extremities: No swelling or edema. No cyanosis or clubbing. Neurological: Sedated, unresponsive. Skin: Warm, dry and intact. Dry  skin with bilateral evidence of venous stasis  Lines: Right IJ Non-tunneled CVC Left IJ HD Cath   Lab Results & Microbiology Lab Results  Component Value Date   WBC 21.9 (H) 10/31/2020   HGB 7.5 (L) 10/31/2020   HCT 22.0 (L) 10/31/2020   MCV 84.9 10/31/2020   PLT 73 (L) 10/31/2020    Lab Results  Component Value Date   NA 137 10/31/2020   K 5.4 (H) 10/31/2020   CO2 18 (L) 10/31/2020   GLUCOSE 260 (H) 10/31/2020   BUN 31 (H) 10/31/2020   CREATININE 4.36 (H) 10/31/2020   CALCIUM 7.4 (L) 10/31/2020   GFRNONAA 15 (L) 10/31/2020   GFRAA 26 (L) 04/21/2020    Lab Results  Component Value Date   ALT 44 10/31/2020   AST 82 (H) 10/31/2020   ALKPHOS 36 (L) 10/31/2020   BILITOT 1.4 (H) 10/31/2020    C-Reactive Protein  No results found for: CRP  Erythrocyte Sedimentation Rate  No results found for: ESRSEDRATE    I have reviewed the micro  and lab results in [place].  Imaging CT ABDOMEN PELVIS WO CONTRAST  Result Date: 10/30/2020 CLINICAL DATA:  Acute abdominal pain. Nausea and vomiting. Fever. Blood in the nephrostomy tube. EXAM: CT ABDOMEN AND PELVIS WITHOUT CONTRAST TECHNIQUE: Multidetector CT imaging of the abdomen and pelvis was performed following the standard protocol without IV contrast. COMPARISON:  03/22/2018 FINDINGS: Lower chest: Lung bases are clear. Hepatobiliary: No focal liver abnormality is seen. No gallstones, gallbladder wall thickening, or biliary dilatation. Pancreas: Unremarkable. No pancreatic ductal dilatation or surrounding inflammatory changes. Spleen: Normal in size without focal abnormality. Adrenals/Urinary Tract: A percutaneous nephrostomy tube is present. The pigtail demonstrates a shallow location within the kidney suggesting that it has withdrawn from the renal pelvis. The left kidney is diffusely heterogeneous in appearance with enlargement, heterogeneous hyperattenuation consistent with parenchymal hemorrhage, and small amounts of gas. There is  extensive stranding/hematoma surrounding the left kidney, extending to the anterior and posterior pararenal fascia and into the retroperitoneum and adjacent mesentery. This may indicate vascular injury resulting from displacement of the nephrostomy tube. Alternately, trauma or spontaneous hemorrhage could be the cause of hematoma. The right kidney is atrophic. Bladder is decompressed. Stomach/Bowel: Stomach is grossly unremarkable. Small bowel and colon are diffusely decompressed. No focal abnormalities identified. Vascular/Lymphatic: There is a peripherally calcified abdominal aortic aneurysm measuring 3.6 cm in diameter, unchanged since prior study. Periaortic retroperitoneal stranding and soft tissue prominence with somewhat nodular contours. This may represent retroperitoneal fibrosis, confluent lymphadenopathy, or aortitis. Appearances are similar to the prior study. Reproductive: Prostate is unremarkable. Other: No free air in the abdomen. Abdominal wall musculature appears intact. Infiltration in the anterior subcutaneous fat could represent contusions, varices, or cellulitis. Musculoskeletal: No destructive bone lesions. Degenerative changes in the hips. 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 IMPRESSION: 1. Left percutaneous nephrostomy tube is present. The pigtail demonstrates a shallow location within the kidney suggesting that it has withdrawn from the renal pelvis. 2. Prominent left renal hematoma with extensive stranding and hematoma in the pararenal spaces and apparently extending into the mesentery. 3. Periaortic retroperitoneal stranding and soft tissue prominence suggesting retroperitoneal fibrosis, confluent lymphadenopathy, or aortitis. Appearances are similar to the prior study. 4. 3.6 cm peripherally calcified abdominal aortic aneurysm, unchanged since prior study. 5. 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 6. Infiltration in the anterior subcutaneous fat could represent contusions, varices, or cellulitis. Aortic Atherosclerosis (ICD10-I70.0). These results were called by telephone at the time of interpretation on 10/30/2020 at 3:11 am to provider Ripley Fraise , who verbally acknowledged these results. Electronically Signed   By: Lucienne Capers M.D.   On: 10/30/2020 03:16   CT Head Wo Contrast  Result Date: 10/30/2020 CLINICAL DATA:  Nausea and vomiting, fever, and blood in the nephrostomy tube starting today. EXAM: CT HEAD WITHOUT CONTRAST TECHNIQUE: Contiguous axial images were obtained from the base of the skull through the vertex without intravenous contrast. COMPARISON:  02/02/2020 FINDINGS: Brain: Subacute subdural hematoma in the left frontal region. Maximal depth measures about 1.5 cm. Mildly increased maximal thickness since the previous study, but overall, the hematoma is decreased in size since prior study. Subdural gas is also decreased. Encephalomalacia in the left frontal lobe. Improving left-to-right midline shift. Changes are all consistent with postoperative change. No ventricular dilatation. No developing acute intracranial changes. Vascular: Mild intracranial arterial vascular calcifications. Skull: Postoperative left frontotemporal parietal craniotomy with plate and screw fixation of the bone flap. No acute  depressed skull fractures. Sinuses/Orbits: Expansile filling of the maxillary antra with extension into the right nasopharynx. Bone remodeling and internal calcification. No change since previous study. Changes consistent with chronic sinusitis, possibly with polyposis. Calcifications suggest atypical infection, possibly fungal superinfection. Other: None. IMPRESSION: 1. Postoperative left frontotemporal parietal craniotomy. Postoperative changes with underlying subdural hematoma, subdural gas, and encephalomalacia. Postoperative changes are decreasing since prior study. 2.  Expansile opacification of the maxillary antra with internal calcifications suggesting chronic inflammatory process, possibly fungal superinfection. No change. Electronically Signed   By: Lucienne Capers M.D.   On: 10/30/2020 03:23   DG CHEST PORT 1 VIEW  Result Date: 10/30/2020 CLINICAL DATA:  Central venous catheter placement EXAM: PORTABLE CHEST 1 VIEW COMPARISON:  10/30/2020 FINDINGS: Endotracheal and enteric tubes as well as right central venous catheter are unchanged in position. A new left central venous catheter has been placed with tip over the mid SVC region, oriented towards the side wall of the vessel. No pneumothorax. Shallow inspiration with atelectasis in the lung bases. Mild cardiac enlargement. No pleural effusions. IMPRESSION: New left central venous catheter with tip over the mid SVC region, oriented towards the side wall of the vessel. No pneumothorax. Otherwise stable appearance of the chest. Electronically Signed   By: Lucienne Capers M.D.   On: 10/30/2020 22:58   DG CHEST PORT 1 VIEW  Result Date: 10/30/2020 CLINICAL DATA:  Hypoxia.  Central catheter placement EXAM: PORTABLE CHEST 1 VIEW COMPARISON:  October 30, 2020 study obtained earlier in the day FINDINGS: Central catheter tip is in the superior vena cava. Endotracheal tube tip is 4.4 cm above the carina. Nasogastric tube tip and side port are below the diaphragm. No pneumothorax. Lungs are clear. Heart is upper normal in size with pulmonary vascularity normal. No adenopathy. No bone lesions. IMPRESSION: Tube and catheter positions as described without pneumothorax. Lungs clear. Stable cardiac silhouette. Electronically Signed   By: Lowella Grip III M.D.   On: 10/30/2020 08:12   DG Chest Port 1 View  Result Date: 10/30/2020 CLINICAL DATA:  Intubation. EXAM: PORTABLE CHEST 1 VIEW COMPARISON:  10/30/2020 FINDINGS: Endotracheal and enteric tubes are unchanged in position. Shallow inspiration. Heart size and pulmonary  vascularity are normal. Lungs are clear. No change. IMPRESSION: No active disease. Electronically Signed   By: Lucienne Capers M.D.   On: 10/30/2020 05:09   DG Chest Port 1 View  Result Date: 10/30/2020 CLINICAL DATA:  Fever. EXAM: PORTABLE CHEST 1 VIEW COMPARISON:  01/24/2020 FINDINGS: The lungs are essentially clear. There is no pneumothorax or large pleural effusion. The trachea is off midline which is felt to be secondary to some degree of patient rotation. The heart size is stable. There is no acute osseous abnormality. IMPRESSION: No active disease. Electronically Signed   By: Constance Holster M.D.   On: 10/30/2020 00:37     [facility] [phone] pager 10/31/2020, 8:17 AM

## 2020-10-31 NOTE — Progress Notes (Signed)
Pt transported to CT via ventilator with no complications noted.  

## 2020-10-31 NOTE — Procedures (Signed)
59 year old male with acute renal failure related to sepsis.  Patient was seen and evaluated on CRRT  Currently on M100, goal net even, BFR 300, all 2K bath, 2.5 ca, pre filter 343mL/hr, post filter 500 mL/hr, DFR 2L/hr.  Continue current settings.  Monitor labs twice a day

## 2020-10-31 NOTE — Progress Notes (Signed)
Referring Physician(s): Dr. Ina Homes  Supervising Physician: Dr. Ruthann Cancer  Patient Status:  [facility] - In-pt  Chief Complaint: Septic shock  Subjective: Remains intubated, sedated Unsuccessful attempt at replacement of left PCN yesterday. Lactic acid elevated. AMS, possible seizure-like activity this AM.  CT Head unrevealing.   Allergies: Chlorhexidine and Other  Medications: Prior to Admission medications   Medication Sig Start Date End Date Taking? Authorizing Provider  acetaminophen (TYLENOL) 325 MG tablet Take 650 mg by mouth every 8 (eight) hours as needed for moderate pain or headache.   Yes [provider]  Amino Acids-Protein Hydrolys (FEEDING SUPPLEMENT, PRO-STAT SUGAR FREE 64,) LIQD Take 30 mLs by mouth 3 (three) times daily. 02/13/20  Yes Arrien, Jimmy Picket, MD  calcium carbonate (CALTRATE 600) 1500 (600 Ca) MG TABS tablet Take 1 tablet by mouth 2 (two) times daily.    Yes [provider]  cetaphil (CETAPHIL) lotion Apply 1 application topically every 12 (twelve) hours as needed for dry skin. Apply to arms, legs, and feet   Yes [provider]  Cholecalciferol 1000 units tablet Take 2,000 Units by mouth daily.    Yes [provider]  ezetimibe (ZETIA) 10 MG tablet Take 10 mg by mouth every evening.    Yes [provider]  famotidine (PEPCID) 20 MG tablet Take 1 tablet (20 mg total) by mouth at bedtime. 02/13/20 10/30/21 Yes Arrien, Jimmy Picket, MD  heparin 5000 UNIT/ML injection Inject 1 mL (5,000 Units total) into the skin every 8 (eight) hours. Patient taking differently: Inject 5,000 Units into the skin every 8 (eight) hours. At 0600, 1400, and 2200 02/13/20 10/30/21 Yes Arrien, Jimmy Picket, MD  HYDROcodone-acetaminophen (NORCO/VICODIN) 5-325 MG tablet Take 1 tablet by mouth every 6 (six) hours as needed for pain. 07/27/20  Yes [provider]  mycophenolate (CELLCEPT) 500 MG tablet Take 500 mg by  mouth 2 (two) times daily.   Yes [provider]  Omega-3 Fatty Acids (FISH OIL) 1000 MG CAPS Take 1,000 mg by mouth in the morning and at bedtime.   Yes [provider]  polyethylene glycol (MIRALAX / GLYCOLAX) 17 g packet Take 17 g by mouth daily as needed for mild constipation. Patient taking differently: Take 17 g by mouth daily as needed for mild constipation. Mix in 4-8oz of liquid 02/13/20  Yes Arrien, Jimmy Picket, MD  senna (SENOKOT) 8.6 MG TABS tablet Take 8.6 mg by mouth daily.    Yes [provider]  simvastatin (ZOCOR) 80 MG tablet Take 80 mg by mouth at bedtime.   Yes [provider]  sodium bicarbonate 650 MG tablet Take 650 mg by mouth 3 (three) times daily.   Yes [provider]  tamsulosin (FLOMAX) 0.4 MG CAPS capsule Take 0.4 mg by mouth at bedtime.   Yes [provider]  ibuprofen (ADVIL) 800 MG tablet Take 1 tablet (800 mg total) by mouth every 8 (eight) hours as needed. Patient not taking: Reported on 10/30/2020 04/21/20   Ashok Pall, MD     Vital Signs: BP (!) 120/106   Pulse 77   Temp 97.7 F (36.5 C) (Axillary)   Resp (!) 23   Ht 6\' 1"  (1.854 m)   Wt 276 lb 10.8 oz (125.5 kg)   SpO2 100%   BMI 36.50 kg/m   Physical Exam  Intubated/sedated.  Abdomen: rotated on left side, procedure site down. No obvious issues with site.  GU: blood-tinged urine in foley.   Imaging:  CT ABDOMEN PELVIS WO CONTRAST  Result Date: 10/31/2020 CLINICAL DATA:  Follow-up left perinephric hematoma. EXAM: CT ABDOMEN AND PELVIS WITHOUT CONTRAST TECHNIQUE: Multidetector CT imaging of the abdomen and pelvis was performed following the standard protocol without IV contrast. COMPARISON:  10/30/2020 FINDINGS: Lower chest: Small bilateral pleural effusions with overlying subsegmental atelectasis. Hepatobiliary: No focal liver abnormality identified. Gallbladder unremarkable. No signs of biliary ductal dilatation Pancreas: Unremarkable.  No pancreatic ductal dilatation or surrounding inflammatory changes. Spleen: Normal in size without focal abnormality. Adrenals/Urinary Tract: Normal appearance of the adrenal glands. Asymmetric atrophy of the right kidney is again noted. Excreted contrast material is identified within the right renal collecting system. Left kidney subcapsular hematoma is again noted. This measures 10.7 x 7.1 cm, image 60/6. Previously 10.4 x 6.6 cm. There is surrounding perinephric hemorrhage with signs of mildly progressive hemoperitoneum. Collection of high attenuation material anterior to the mid right kidney is identified measuring 4.6 x 2.7 cm, image 42/3 and likely represents extravasated contrast material from attempted percutaneous nephrostomy tube placement performed yesterday. Concentrated IV contrast material noted within the urinary bladder. Stomach/Bowel: There is an indwelling nasogastric tube with tip in the distal stomach. High contrast material noted within the stomach and proximal duodenum. No dilated loops of large or small bowel. Vascular/Lymphatic: Aortic atherosclerosis. 3.5 cm infrarenal abdominal aortic aneurysm is again noted. 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 Reproductive: Prostate is unremarkable. Other: High attenuation peritoneal fluid consistent with hemoperitoneum. This appears increased in volume when compared with 10/30/2020. Musculoskeletal: No acute or significant osseous findings. IMPRESSION: 1. Mild increase in size of left kidney subcapsular hematoma with signs of progressive hemoperitoneum. Hemoperitoneum is again noted and is also mildly increased in volume from previous study. 2. Collection of high attenuation material anterior to the mid right kidney likely represents extravasated contrast material. 3. Signs of retroperitoneal fibrosis with increased retroperitoneal  soft tissue surrounding the abdominal aorta and encasing bilateral ureters. 4. Small bilateral pleural effusions with overlying subsegmental atelectasis. 5. 3.5 cm infrarenal abdominal aortic aneurysm. Aortic aneurysm NOS (ICD10-I71.9). 6. Aortic atherosclerosis. Aortic Atherosclerosis (ICD10-I70.0). Critical Value/emergent results were called by telephone at the time of interpretation on 10/31/2020 at 10:17 am to provider [facility] , who verbally acknowledged these results. Electronically Signed   By: Kerby Moors M.D.   On: 10/31/2020 10:18   CT ABDOMEN PELVIS WO CONTRAST  Result Date: 10/30/2020 CLINICAL DATA:  Acute abdominal pain. Nausea and vomiting. Fever. Blood in the nephrostomy tube. EXAM: CT ABDOMEN AND PELVIS WITHOUT CONTRAST TECHNIQUE: Multidetector CT imaging of the abdomen and pelvis was performed following the standard protocol without IV contrast. COMPARISON:  03/22/2018 FINDINGS: Lower chest: Lung bases are clear. Hepatobiliary: No focal liver abnormality is seen. No gallstones, gallbladder wall thickening, or biliary dilatation. Pancreas: Unremarkable. No pancreatic ductal dilatation or surrounding inflammatory changes. Spleen: Normal in size without focal abnormality. Adrenals/Urinary Tract: A percutaneous nephrostomy tube is present. The pigtail demonstrates a shallow location within the kidney suggesting that it has withdrawn from the renal pelvis. The left kidney is diffusely heterogeneous in appearance with enlargement, heterogeneous hyperattenuation consistent with parenchymal hemorrhage, and small amounts of gas. There is extensive stranding/hematoma surrounding the left kidney, extending to the anterior and posterior pararenal fascia and into the retroperitoneum and adjacent mesentery. This may indicate vascular injury resulting from displacement of the nephrostomy tube. Alternately, trauma or spontaneous hemorrhage could be the cause of hematoma. The  right kidney is atrophic.  Bladder is decompressed. Stomach/Bowel: Stomach is grossly unremarkable. Small bowel and colon are diffusely decompressed. No focal abnormalities identified. Vascular/Lymphatic: There is a peripherally calcified abdominal aortic aneurysm measuring 3.6 cm in diameter, unchanged since prior study. Periaortic retroperitoneal stranding and soft tissue prominence with somewhat nodular contours. This may represent retroperitoneal fibrosis, confluent lymphadenopathy, or aortitis. Appearances are similar to the prior study. Reproductive: Prostate is unremarkable. Other: No free air in the abdomen. Abdominal wall musculature appears intact. Infiltration in the anterior subcutaneous fat could represent contusions, varices, or cellulitis. Musculoskeletal: No destructive bone lesions. Degenerative changes in the hips. 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 IMPRESSION: 1. Left percutaneous nephrostomy tube is present. The pigtail demonstrates a shallow location within the kidney suggesting that it has withdrawn from the renal pelvis. 2. Prominent left renal hematoma with extensive stranding and hematoma in the pararenal spaces and apparently extending into the mesentery. 3. Periaortic retroperitoneal stranding and soft tissue prominence suggesting retroperitoneal fibrosis, confluent lymphadenopathy, or aortitis. Appearances are similar to the prior study. 4. 3.6 cm peripherally calcified abdominal aortic aneurysm, unchanged since prior study. 5. 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 6. Infiltration in the anterior subcutaneous fat could represent contusions, varices, or cellulitis. Aortic Atherosclerosis (ICD10-I70.0). These results were called by telephone at the time of interpretation on 10/30/2020 at 3:11 am to provider Ripley Fraise , who verbally acknowledged these results. Electronically Signed   By: Lucienne Capers M.D.    On: 10/30/2020 03:16   CT HEAD WO CONTRAST  Result Date: 10/31/2020 CLINICAL DATA:  Cerebral hemorrhage. EXAM: CT HEAD WITHOUT CONTRAST TECHNIQUE: Contiguous axial images were obtained from the base of the skull through the vertex without intravenous contrast. COMPARISON:  CT head November 27 21. FINDINGS: Brain: Postsurgical changes of left left-sided craniotomy. Similar size of an underlying subdural hemorrhage, which measures up to 1.4 cm in thickness. Single locule within this collection, unchanged. Similar associated mass effect without substantial midline shift. Similar appearance of encephalomalacia within the left frontal lobe white matter. No hydrocephalus. No evidence of new/acute large vascular territory infarct. Vascular: Calcific atherosclerosis. Skull: Left-sided craniotomy. Sinuses/Orbits: Similar complete opacification of bilateral maxillary sinuses with mineralization in the left maxillary sinus. Maxillary sinus opacification is mildly expansile with surrounding bony thickening, suggesting chronicity. Mucosal thickening of ethmoid air cells and sphenoid sinuses. Other: No mastoid effusions. IMPRESSION: 1. No substantial change in left left frontal craniotomy with subjacent 1.4 cm thick underlying subdural hematoma. Similar so shaded mass effect. 2. Left frontal e white matter encephalomalacia. 3. Similar findings of chronic bilateral maxillary sinusitis, as detailed above. Electronically Signed   By: Margaretha Sheffield MD   On: 10/31/2020 09:51   CT Head Wo Contrast  Result Date: 10/30/2020 CLINICAL DATA:  Nausea and vomiting, fever, and blood in the nephrostomy tube starting today. EXAM: CT HEAD WITHOUT CONTRAST TECHNIQUE: Contiguous axial images were obtained from the base of the skull through the vertex without intravenous contrast. COMPARISON:  02/02/2020 FINDINGS: Brain: Subacute subdural hematoma in the left frontal region. Maximal depth measures about 1.5 cm. Mildly increased maximal  thickness since the previous study, but overall, the hematoma is decreased in size since prior study. Subdural gas is also decreased. Encephalomalacia in the left frontal lobe. Improving left-to-right midline shift. Changes are all consistent with postoperative change. No ventricular dilatation. No developing acute intracranial changes. Vascular: Mild intracranial arterial vascular calcifications. Skull: Postoperative left frontotemporal  parietal craniotomy with plate and screw fixation of the bone flap. No acute depressed skull fractures. Sinuses/Orbits: Expansile filling of the maxillary antra with extension into the right nasopharynx. Bone remodeling and internal calcification. No change since previous study. Changes consistent with chronic sinusitis, possibly with polyposis. Calcifications suggest atypical infection, possibly fungal superinfection. Other: None. IMPRESSION: 1. Postoperative left frontotemporal parietal craniotomy. Postoperative changes with underlying subdural hematoma, subdural gas, and encephalomalacia. Postoperative changes are decreasing since prior study. 2. Expansile opacification of the maxillary antra with internal calcifications suggesting chronic inflammatory process, possibly fungal superinfection. No change. Electronically Signed   By: Lucienne Capers M.D.   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 EXAM: 1. Fluoroscopic guided left nephrostomy tube check 2. Attempted fluoroscopic and ultrasound-guided nephrostomy track recanalization 3. Limited CT abdomen 4. Attempted CT-guided nephrostomy placement COMPARISON:  CT abdomen pelvis from 10/30/2020 and IR nephrostomy exchange from 08/19/2020 CONTRAST:  80 mL  Omnipaque, intravenous FLUOROSCOPY TIME:  Minutes   seconds COMPLICATIONS: None immediate. TECHNIQUE: Informed written consent was obtained from the patient's sister after a discussion of the risks, benefits and alternatives to treatment. Questions regarding the procedure were encouraged and answered. A timeout was performed prior to the initiation of the procedure. The left flank and external portion of existing nephrostomy catheter were prepped and draped in the usual sterile fashion. A sterile drape was applied covering the operative field. Maximum barrier sterile technique with sterile gowns and gloves were used for the procedure. A timeout was performed prior to the initiation of the procedure. A pre procedural spot fluoroscopic image was obtained after contrast was injected via the existing nephrostomy catheter demonstrating withdrawal from the renal parenchyma into the pericapsular space. The tract was attempted to be recannulated with a Glidewire and Kumpe the catheter which was unable to be passed into the collecting system. Due to the coiled nature of the indwelling nephrostomy, it was thought that the pigtail portion may be blocking the entry site of the established tract into the kidney, therefore the indwelling catheter was removed. After additional manipulation of the catheter and wire combination, recanalization of the tract was unable to be performed. Ultrasound was then used to examine the left flank for potential new nephrostomy tube placement. Due to body habitus and overlying gas, the left kidney was not visible by ultrasound. The decision was made to transfer the patient to the CT scanner for further attempt. The patient was positioned prone on the CT scanner. The left flank was prepped and draped in standard fashion. A sterile drape was applied covering the operative field. An additional time-out was performed prior to initiation of this portion of the procedure. Limited CT of the abdomen  demonstrated trace residual contrast within the existing nephrostomy tract and contrast layering along the anterior aspect of the renal capsule with partial extension into the peritoneum. There is no evidence of hydronephrosis. The procedure was planned with CT. At the needle entry site, local anesthesia was administered 1% lidocaine. Deeper local anesthesia was provided to the oblique musculature with a 22 gauge spinal needle. A 22 gauge Chiba needle was then directed under intermittent fluoroscopic CT guidance to the level of the collecting system. After numerous attempts, the collecting system was unable to be cannulated. Gentle hand injection of contrast was unable  to opacify the collecting system convincingly. A wire would not pass into the collecting system. After permission of the ICU team in light of the patient's renal function, 80 mL Omnipaque 300 were injected intravenously through the patient is central catheter to perform a CT IVP. Limited sequence imaging at the level of the inferior pole of the kidney was acquired at 1, 2, 5, and 10 minutes. There is no evidence of excretion of contrast material within the collecting system. At this point, the procedure was terminated and the patient was returned to the intensive care unit. The patient tolerated the procedure well without immediate postprocedural complication. FINDINGS: The existing nephrostomy catheter is withdrawn into the perirenal space. Unsuccessful attempt at established track recanalization with fluoroscopic or ultrasound guidance. Unsuccessful attempt at percutaneous nephrostomy tube placement with CT guidance. CT IVP demonstrates severely diminished renal function without nephrogenic phase or excretion of contrast in the collecting system after 10 minutes. There is no evidence of hydronephrosis. IMPRESSION: 1. Indwelling nephrostomy tube retracted into the perirenal space. 2. Unsuccessful attempt at established nephrostomy track  recanalization under fluoroscopic ultrasound guidance. 3. Unsuccessful attempt at percutaneous nephrostomy tube placement under CT guidance due to decompression of the collecting system. Limited CT abdomen demonstrates severely diminished renal function without nephrogenic phase or collecting system target due to no evidence of contrast material excretion. PLAN: Repeat noncontrast CT abdomen in 24-48 hours to evaluate for evidence of hydronephrosis and potential target for replacement of right nephrostomy tube. Ruthann Cancer, MD [facility] Electronically Signed   By: Ruthann Cancer MD   On: 10/31/2020 08:31   DG CHEST PORT 1 VIEW  Result Date: 10/30/2020 CLINICAL DATA:  Central venous catheter placement EXAM: PORTABLE CHEST 1 VIEW COMPARISON:  10/30/2020 FINDINGS: Endotracheal and enteric tubes as well as right central venous catheter are unchanged in position. A new left central venous catheter has been placed with tip over the mid SVC region, oriented towards the side wall of the vessel. No pneumothorax. Shallow inspiration with atelectasis in the lung bases. Mild cardiac enlargement. No pleural effusions. IMPRESSION: New left central venous catheter with tip over the mid SVC region, oriented towards the side wall of the vessel. No pneumothorax. Otherwise stable appearance of the chest. Electronically Signed   By: Lucienne Capers M.D.   On: 10/30/2020 22:58   DG CHEST PORT 1 VIEW  Result Date: 10/30/2020 CLINICAL DATA:  Hypoxia.  Central catheter placement EXAM: PORTABLE CHEST 1 VIEW COMPARISON:  October 30, 2020 study obtained earlier in the day FINDINGS: Central catheter tip is in the superior vena cava. Endotracheal tube tip is 4.4 cm above the carina. Nasogastric tube tip and side port are below the diaphragm. No pneumothorax. Lungs are clear. Heart is upper normal in size with pulmonary vascularity normal. No adenopathy. No bone lesions.  IMPRESSION: Tube and catheter positions as described without pneumothorax. Lungs clear. Stable cardiac silhouette. Electronically Signed   By: Lowella Grip III M.D.   On: 10/30/2020 08:12   DG Chest Port 1 View  Result Date: 10/30/2020 CLINICAL DATA:  Intubation. EXAM: PORTABLE CHEST 1 VIEW COMPARISON:  10/30/2020 FINDINGS: Endotracheal and enteric tubes are unchanged in position. Shallow inspiration. Heart size and pulmonary vascularity are normal. Lungs are clear. No change. IMPRESSION: No active disease. Electronically Signed   By: Lucienne Capers M.D.   On: 10/30/2020 05:09   DG Chest Port 1 View  Result Date: 10/30/2020 CLINICAL DATA:  Fever. EXAM: PORTABLE CHEST 1  VIEW COMPARISON:  01/24/2020 FINDINGS: The lungs are essentially clear. There is no pneumothorax or large pleural effusion. The trachea is off midline which is felt to be secondary to some degree of patient rotation. The heart size is stable. There is no acute osseous abnormality. IMPRESSION: No active disease. Electronically Signed   By: Constance Holster M.D.   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 EXAM: 1. Fluoroscopic guided left nephrostomy tube check 2. Attempted fluoroscopic and ultrasound-guided nephrostomy track recanalization 3. Limited CT abdomen 4. Attempted CT-guided nephrostomy placement COMPARISON:  CT abdomen pelvis from 10/30/2020 and IR nephrostomy exchange from 08/19/2020 CONTRAST:  80 mL Omnipaque, intravenous FLUOROSCOPY TIME:  Minutes   seconds COMPLICATIONS: None immediate. TECHNIQUE: Informed written consent was obtained from the patient's sister after a discussion of the risks, benefits and alternatives to treatment. Questions regarding  the procedure were encouraged and answered. A timeout was performed prior to the initiation of the procedure. The left flank and external portion of existing nephrostomy catheter were prepped and draped in the usual sterile fashion. A sterile drape was applied covering the operative field. Maximum barrier sterile technique with sterile gowns and gloves were used for the procedure. A timeout was performed prior to the initiation of the procedure. A pre procedural spot fluoroscopic image was obtained after contrast was injected via the existing nephrostomy catheter demonstrating withdrawal from the renal parenchyma into the pericapsular space. The tract was attempted to be recannulated with a Glidewire and Kumpe the catheter which was unable to be passed into the collecting system. Due to the coiled nature of the indwelling nephrostomy, it was thought that the pigtail portion may be blocking the entry site of the established tract into the kidney, therefore the indwelling catheter was removed. After additional manipulation of the catheter and wire combination, recanalization of the tract was unable to be performed. Ultrasound was then used to examine the left flank for potential new nephrostomy tube placement. Due to body habitus and overlying gas, the left kidney was not visible by ultrasound. The decision was made to transfer the patient to the CT scanner for further attempt. The patient was positioned prone on the CT scanner. The left flank was prepped and draped in standard fashion. A sterile drape was applied covering the operative field. An additional time-out was performed prior to initiation of this portion of the procedure. Limited CT of the abdomen demonstrated trace residual contrast within the existing nephrostomy tract and contrast layering along the anterior aspect of the renal capsule with partial extension into the peritoneum. There is no evidence of hydronephrosis. The procedure was planned with CT.  At the needle entry site, local anesthesia was administered 1% lidocaine. Deeper local anesthesia was provided to the oblique musculature with a 22 gauge spinal needle. A 22 gauge Chiba needle was then directed under intermittent fluoroscopic CT guidance to the level of the collecting system. After numerous attempts, the collecting system was unable to be cannulated. Gentle hand injection of contrast was unable to opacify the collecting system convincingly. A wire would not pass into the collecting system. After permission of the ICU team in light of the patient's renal function, 80 mL Omnipaque 300 were injected intravenously through the patient is central catheter to perform a CT IVP. Limited sequence imaging at  the level of the inferior pole of the kidney was acquired at 1, 2, 5, and 10 minutes. There is no evidence of excretion of contrast material within the collecting system. At this point, the procedure was terminated and the patient was returned to the intensive care unit. The patient tolerated the procedure well without immediate postprocedural complication. FINDINGS: The existing nephrostomy catheter is withdrawn into the perirenal space. Unsuccessful attempt at established track recanalization with fluoroscopic or ultrasound guidance. Unsuccessful attempt at percutaneous nephrostomy tube placement with CT guidance. CT IVP demonstrates severely diminished renal function without nephrogenic phase or excretion of contrast in the collecting system after 10 minutes. There is no evidence of hydronephrosis. IMPRESSION: 1. Indwelling nephrostomy tube retracted into the perirenal space. 2. Unsuccessful attempt at established nephrostomy track recanalization under fluoroscopic ultrasound guidance. 3. Unsuccessful attempt at percutaneous nephrostomy tube placement under CT guidance due to decompression of the collecting system. Limited CT abdomen demonstrates severely diminished renal function without nephrogenic  phase or collecting system target due to no evidence of contrast material excretion. PLAN: Repeat noncontrast CT abdomen in 24-48 hours to evaluate for evidence of hydronephrosis and potential target for replacement of right nephrostomy tube. Ruthann Cancer, MD [facility] Electronically Signed   By: Ruthann Cancer MD   On: 10/31/2020 08:31    Labs:  CBC: Recent Labs    10/30/20 0009 10/30/20 0009 10/30/20 1038 10/30/20 1110 10/30/20 1955 10/30/20 1955 10/31/20 0039 10/31/20 0452 10/31/20 0722 10/31/20 0745 10/31/20 1254  WBC 1.7*  --  21.1*  --  29.8*  --   --  21.9*  --   --   --   HGB 12.9*   < > 7.3*   < > 8.4*   < > 7.8* 7.5* 7.5*  --  7.4*  HCT 43.9   < > 24.5*   < > 27.0*   < > 23.0* 24.1* 22.0*  --  22.9*  PLT 173   < > 158  --  118*  --   --  88*  --  73*  --    < > = values in this interval not displayed.    COAGS: Recent Labs    01/23/20 0235 10/30/20 0009 10/30/20 1038 10/31/20 0745  INR 1.2 1.1 1.6* 1.9*  APTT 40* 32 39* 46*    BMP: Recent Labs    02/10/20 0500 02/10/20 0500 02/11/20 0536 02/11/20 0536 02/12/20 0358 02/12/20 0358 04/21/20 1300 04/21/20 1325 10/30/20 0009 10/30/20 0009 10/30/20 1038 10/30/20 1110 10/30/20 1955 10/31/20 0039 10/31/20 0452 10/31/20 0722  NA 135   < > 135   < > 136   < > 139   < > 139   < > 140   < > 136 136 136 137  K 4.0   < > 4.4   < > 4.8   < > 4.5   < > 4.3   < > 4.8   < > 6.5* 6.6* 5.9* 5.4*  CL 99   < > 100   < > 102   < > 105   < > 105  --  106  --  104  --  100  --   CO2 23   < > 24   < > 22   < > 24   < > 23  --  16*  --  14*  --  18*  --   GLUCOSE 151*   < > 165*   < >  184*   < > 96   < > 165*  --  122*  --  205*  --  260*  --   BUN 50*   < > 52*   < > 49*   < > 52*   < > 31*  --  31*  --  35*  --  31*  --   CALCIUM 8.9   < > 8.9   < > 9.2   < > 9.2   < > 8.9  --  7.8*  --  7.6*  --  7.4*  --   CREATININE 2.33*   < > 2.29*   < > 2.16*   < > 2.93*    < > 3.65*  --  4.57*  --  4.98*  --  4.36*  --   GFRNONAA 29*   < > 30*   < > 32*   < > 22*  --  18*  --  14*  --  12*  --  15*  --   GFRAA 34*  --  35*  --  37*  --  26*  --   --   --   --   --   --   --   --   --    < > = values in this interval not displayed.    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  Reviewed with Dr. Serafina Royals. Similar appearance to left kidney post procedure attempt yesterday.  Small amount of contrast in the right collecting system, none in the left as was found during the procedure yesterday.  Does have some blood-tinged UOP, however on CRRT.  Patient with elevated lactic acid today. AMS changes.  On ampicillin for E faecalis.  IR following.   Electronically Signed: Docia Barrier, PA 10/31/2020, 1:41 PM   I spent a total of 15 Minutes at the the patient's bedside AND on the patient's hospital floor or unit, greater than 50% of which was counseling/coordinating care for bacteremia, retroperitoneal fibrosis.

## 2020-10-31 NOTE — Progress Notes (Addendum)
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 He presented to OSH ED with nausea, vomiting, fever and blood in his nephrostomy tube that began the day of admission. Imaging in the ED found a renal hematoma.  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 He presented to OSH ED with nausea, vomiting, fever and blood in his nephrostomy tube that began the day of admission.   In the ED, code sepsis was called due to concern for infection, fluids and cefepime were given. CT abdomen pelvis revealed shallow location of left percutaneous nephrostomy tube suggesting that it has withdrawn from the renal pelvis and a prominent left renal hematoma. He was tachycardic to the 160s and febrile to 104. CXR was unrevealing.   Per ED documentation, urology recommended admission to intensive care and reversal of anticoagulation. 1 dose of protamine 50mg  was administered to reverse the nursing home reported anticoagulation (per ED documentation, his last dose of heparin subcutaneous 5000 units was at 2200 on November 26th).   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 IVC filter not able to be placed due to extensive DVT -Subdural hematoma, s/p craniotomy 01/26/2020  Significant Hospital Events   Presented 10/30/20 to OSH ED, found to have renal hematoma and concern for sepsis  Consults:  Urology  Procedures:  NA  Significant Diagnostic Tests:   10/30/20 CT Abdomen Pelvis WO contrast: 1. Left percutaneous nephrostomy tube present, shallow location within the kidney suggesting that it has withdrawn from the renal pelvis 2. Prominent left renal hematoma with extensive stranding and hematoma in the pararenal spaces and apparently extending into the mesentery 3. Periaortic retroperitoneal stranding and soft tissue prominence suggesting retroperitoneal fibrosis, confluent lymphadenopathy, or aortitis. Appearances are similar to the prior study   Micro Data:   10/30/20 Blood culture- E faecalis Urine culture- pending  Antimicrobials:   Vanc/meropenem  Interim history/subjective:  Started on CRRT overnight. E faecalis in blood. Remains intubated on pressors. Some question of seizures. Pressor requirements down. On versed gtt for ?seizures.  Objective   Blood pressure (!) 114/99, pulse 77, temperature (!) 101.9 F (38.8 C), temperature source Axillary, resp. rate (!) 26, height 6\' 1"  (1.854 m), weight 125.5 kg, SpO2 100 %.    Vent Mode: PRVC FiO2 (%):  [30 %-100 %] 30 % Set Rate:  [26 bmp] 26 bmp Vt Set:  [630 mL] 630 mL PEEP:  [5 cmH20] 5 cmH20 Plateau Pressure:  [18 cmH20-19 cmH20] 19 cmH20   Intake/Output Summary (Last 24 hours) at 10/31/2020 0654 Last data filed at 10/31/2020 0603 Gross per 24 hour  Intake 6976.21 ml  Output 1059 ml  Net 5917.21 ml   Filed Weights   10/29/20 2320 10/30/20 0653 10/31/20 0500  Weight: 109 kg 113.4 kg 125.5 kg     Examination: Constitutional: ill appearing sedated man on vent  Eyes: deviated upward, pupils reactive Ears, nose, mouth, and throat: ETT in place, minimal secretions Cardiovascular: RRR, ext warm Respiratory: Clear, triggering vent Gastrointestinal: soft, +BS Skin: No rashes, normal turgor Neurologic: has occasional shivering, not really sure if this is seizure Psychiatric: RASS -5  WBC improving   Resolved Hospital Problem list   NA  Assessment & Plan:  Brendan [name] is a 59 yo man with pmhx significant for 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 He presented to OSH ED with nausea, vomiting, fever and blood in his nephrostomy tube that began the day of admission. Imaging in the ED found a renal hematoma.  Severe septic shock secondary to E faecalis bacteremia- repeat cultures pending.  Suspected source is seeding from markedly abnormal peri-renal architecture.  Also baseline immunocompromised with cellcept. - Continue vanc/meropenem for now given severity of illness, f/u culture data to maturity - Pressor titration to maintain MAPs >65 - Check echo r/o IE  Left perinephric hematoma from dislodged nephrostomy tube Underlying retroperitoneal fibrosis - Attempted recannulation into left kidney complicated by anatomy and since left UPJ was already decompressed - Keep JP into hematoma for now - Hold cellcept - Monitor H/H - Recheck CT A/P  Thrombocytopenia- related to acute illness - check dic panel, monitor daily, AC on hold  Severe acute renal failure with hyperkalemia- started on CRRT 11/27. - Continue CRRT with even pull until pressor requirements improved - DC bicarb gtt once HCO3 > 20  Acute hypoxemic respiratory failure- due to marked acidemia, inability to protect airway from metabolic disarray - Vent support with lung protective tidal volumes - VAP prevention bundle -  Not ready for SBT at present  Acute metabolic encephalopathy and question of associated seizure 11/27-28 - PRN benzodiazepines for further seizure activity, should ease up as metabolic derangements are corrected - Check EEG to make sure not in SE in which case will add AEDs   Best practice (evaluated daily)   Diet: TF Pain/Anxiety/Delirium protocol (if indicated): fentanyl, PRN benzo VAP protocol (if indicated): ordered DVT prophylaxis: Hold in setting of renal hematoma GI prophylaxis: NA Glucose control: Will follow sugars Mobility: PT/OT when able last date of multidisciplinary goals of care discussion: 11/27 Family and staff present: phone, nurse + md Summary of discussion: continue aggressive care Follow up goals of care discussion due: 12/4 Code Status: Full Disposition: ICU status   Patient critically ill due to multiorgan failure Interventions to address this today vent/CRRT/pressors Risk of deterioration without these interventions is high  I personally spent 44 minutes providing critical care not including any separately billable procedures  Erskine Emery MD [facility] 10/31/2020 7:18 AM Personal pager: [phone] If unanswered, please page CCM On-call: [phone]

## 2020-10-31 NOTE — Progress Notes (Signed)
CRITICAL VALUE ALERT  Critical Value:  Vanc trough 22  Date & Time Notied:  10/31/2020 0500  Provider Notified: Warren Lacy  Orders Received/Actions taken: MD made aware

## 2020-10-31 NOTE — Progress Notes (Signed)
[facility] Progress Note [name] DOB: 23-Jul-1961 MRN: [id number]   Date of Service  10/31/2020  HPI/Events of Note  Notified BCID positive for E.faecalis. Already on vancomycin.   eICU Interventions  No change in therapy indicated at this time.     Intervention Category Intermediate Interventions: Infection - evaluation and management  [name] Sekai Gitlin 10/31/2020, 2:04 AM

## 2020-10-31 NOTE — Progress Notes (Addendum)
Addendum: ID changing to Zosyn therapy.  Patient maintains on CRRT.   Plan: Zosyn 3.375g IV every 6 hours (47min infusion) for now. Monitor CRRT, clinical status, and culture susceptibilities.   Sloan Leiter, PharmD, BCPS, BCCCP Clinical Pharmacist Please refer to [facility] for [place] numbers 10/31/2020, 12:16 PM   Pharmacy Antibiotic Note  Staton Markey is a 59 y.o. male admitted on 10/29/2020 with sepsis.  Pharmacy has been consulted for Vancomycin and Merrem dosing. Blood growing Enterococcus faecalis - susceptibilities still pending.   Patient initiated on CRRT 11/27 PM - running well per RN.  Vancomycin trough is 22 after loading dose of 2500 mg given yesterday AM.  Will go ahead and schedule vancomycin 10 mg/kg every 24 hours.   Plan: Vancomycin 1250 mg IV every 24 hours - start at noon today.  Continue Merrem 1 g IV every 8 hours.  Monitor CRRT and need to adjust dosing. Monitor culture results and ability to narrow therapy.   Height: 6\' 1"  (185.4 cm) Weight: 125.5 kg (276 lb 10.8 oz) IBW/kg (Calculated) : 79.9  Temp (24hrs), Avg:99.7 F (37.6 C), Min:97.4 F (36.3 C), Max:101.9 F (38.8 C)  Recent Labs  Lab 10/30/20 0009 10/30/20 0153 10/30/20 1038 10/30/20 1100 10/30/20 1955 10/31/20 0318 10/31/20 0452  WBC 1.7*  --  21.1*  --  29.8*  --  21.9*  CREATININE 3.65*  --  4.57*  --  4.98*  --  4.36*  LATICACIDVEN 4.4* 7.8*  --  >11.0*  --  7.4*  --   VANCOTROUGH  --   --   --   --   --   --  22*    Estimated Creatinine Clearance: 24.7 mL/min (A) (by C-G formula based on SCr of 4.36 mg/dL (H)).    Allergies  Allergen Reactions  . Chlorhexidine   . Other     TB serum-reaction unknown. Provided via [facility] records     Antimicrobials this admission: Vancomycin 11/27 >> Merrem 11/27 >>  Dose adjustments this admission:   Microbiology results: 11/27 BCx >> GPC, BCID: Enterococcus faecalis 11/27 UCx >> 11/27 Fungus cx >> 11/27 COVID/Flu negative 11/27  MRSA PCR negative  Thank you for allowing pharmacy to be a part of this patient's care.  Sloan Leiter, PharmD, BCPS, BCCCP Clinical Pharmacist Please refer to [facility] for [place] numbers 10/31/2020 7:34 AM

## 2020-10-31 NOTE — Progress Notes (Signed)
PHARMACY - PHYSICIAN COMMUNICATION CRITICAL VALUE ALERT - BLOOD CULTURE IDENTIFICATION (BCID)  Brendan [name] is an 59 y.o. male who presented to [facility] on 10/29/2020 with a chief complaint of sepsis  Assessment:  3/4 BC with Enterococcus faecalis. ID will be notified of positive blood culture  Name of physician (or Provider) Contacted: R Stretch  Current antibiotics:  Currently on vancomycin and meropenem   Changes to prescribed antibiotics recommended:  No changes for tonight.  F/u with ID  Results for orders placed or performed during the hospital encounter of 10/29/20  Blood Culture ID Panel (Reflexed) (Collected: 10/30/2020 12:09 AM)  Result Value Ref Range   Enterococcus faecalis DETECTED (A) NOT DETECTED   Enterococcus Faecium NOT DETECTED NOT DETECTED   Listeria monocytogenes NOT DETECTED NOT DETECTED   Staphylococcus species NOT DETECTED NOT DETECTED   Staphylococcus aureus (BCID) NOT DETECTED NOT DETECTED   Staphylococcus epidermidis NOT DETECTED NOT DETECTED   Staphylococcus lugdunensis NOT DETECTED NOT DETECTED   Streptococcus species NOT DETECTED NOT DETECTED   Streptococcus agalactiae NOT DETECTED NOT DETECTED   Streptococcus pneumoniae NOT DETECTED NOT DETECTED   Streptococcus pyogenes NOT DETECTED NOT DETECTED   A.calcoaceticus-baumannii NOT DETECTED NOT DETECTED   Bacteroides fragilis NOT DETECTED NOT DETECTED   Enterobacterales NOT DETECTED NOT DETECTED   Enterobacter cloacae complex NOT DETECTED NOT DETECTED   Escherichia coli NOT DETECTED NOT DETECTED   Klebsiella aerogenes NOT DETECTED NOT DETECTED   Klebsiella oxytoca NOT DETECTED NOT DETECTED   Klebsiella pneumoniae NOT DETECTED NOT DETECTED   Proteus species NOT DETECTED NOT DETECTED   Salmonella species NOT DETECTED NOT DETECTED   Serratia marcescens NOT DETECTED NOT DETECTED   Haemophilus influenzae NOT DETECTED NOT DETECTED   Neisseria meningitidis NOT DETECTED NOT DETECTED   Pseudomonas aeruginosa  NOT DETECTED NOT DETECTED   Stenotrophomonas maltophilia NOT DETECTED NOT DETECTED   Candida albicans NOT DETECTED NOT DETECTED   Candida auris NOT DETECTED NOT DETECTED   Candida glabrata NOT DETECTED NOT DETECTED   Candida krusei NOT DETECTED NOT DETECTED   Candida parapsilosis NOT DETECTED NOT DETECTED   Candida tropicalis NOT DETECTED NOT DETECTED   Cryptococcus neoformans/gattii NOT DETECTED NOT DETECTED   Vancomycin resistance NOT DETECTED NOT DETECTED    Lauralee Evener, Arta Stump [place] 10/31/2020  2:04 AM

## 2020-11-01 ENCOUNTER — Inpatient Hospital Stay (HOSPITAL_COMMUNITY): Payer: Medicaid Other

## 2020-11-01 DIAGNOSIS — I330 Acute and subacute infective endocarditis: Secondary | ICD-10-CM | POA: Diagnosis not present

## 2020-11-01 DIAGNOSIS — R4182 Altered mental status, unspecified: Secondary | ICD-10-CM

## 2020-11-01 DIAGNOSIS — R7881 Bacteremia: Secondary | ICD-10-CM | POA: Diagnosis not present

## 2020-11-01 DIAGNOSIS — A419 Sepsis, unspecified organism: Secondary | ICD-10-CM | POA: Diagnosis not present

## 2020-11-01 DIAGNOSIS — R6521 Severe sepsis with septic shock: Secondary | ICD-10-CM | POA: Diagnosis not present

## 2020-11-01 DIAGNOSIS — B952 Enterococcus as the cause of diseases classified elsewhere: Secondary | ICD-10-CM

## 2020-11-01 DIAGNOSIS — S37012A Minor contusion of left kidney, initial encounter: Secondary | ICD-10-CM | POA: Diagnosis not present

## 2020-11-01 DIAGNOSIS — N179 Acute kidney failure, unspecified: Secondary | ICD-10-CM | POA: Diagnosis not present

## 2020-11-01 LAB — POCT I-STAT 7, (LYTES, BLD GAS, ICA,H+H)
Acid-Base Excess: 11 mmol/L — ABNORMAL HIGH (ref 0.0–2.0)
Acid-Base Excess: 9 mmol/L — ABNORMAL HIGH (ref 0.0–2.0)
Acid-Base Excess: 8 mmol/L — ABNORMAL HIGH (ref 0.0–2.0)
Bicarbonate: 29.6 mmol/L — ABNORMAL HIGH (ref 20.0–28.0)
Bicarbonate: 31.3 mmol/L — ABNORMAL HIGH (ref 20.0–28.0)
Bicarbonate: 31.9 mmol/L — ABNORMAL HIGH (ref 20.0–28.0)
Calcium, Ion: 0.94 mmol/L — ABNORMAL LOW (ref 1.15–1.40)
Calcium, Ion: 0.97 mmol/L — ABNORMAL LOW (ref 1.15–1.40)
Calcium, Ion: 1.01 mmol/L — ABNORMAL LOW (ref 1.15–1.40)
HCT: 23 % — ABNORMAL LOW (ref 39.0–52.0)
HCT: 23 % — ABNORMAL LOW (ref 39.0–52.0)
HCT: 22 % — ABNORMAL LOW (ref 39.0–52.0)
Hemoglobin: 7.8 g/dL — ABNORMAL LOW (ref 13.0–17.0)
Hemoglobin: 7.8 g/dL — ABNORMAL LOW (ref 13.0–17.0)
Hemoglobin: 7.5 g/dL — ABNORMAL LOW (ref 13.0–17.0)
O2 Saturation: 98 %
O2 Saturation: 99 %
O2 Saturation: 99 %
Patient temperature: 97.5
Patient temperature: 97.5
Potassium: 2.8 mmol/L — ABNORMAL LOW (ref 3.5–5.1)
Potassium: 3 mmol/L — ABNORMAL LOW (ref 3.5–5.1)
Potassium: 2.8 mmol/L — ABNORMAL LOW (ref 3.5–5.1)
Sodium: 138 mmol/L (ref 135–145)
Sodium: 139 mmol/L (ref 135–145)
Sodium: 139 mmol/L (ref 135–145)
TCO2: 30 mmol/L (ref 22–32)
TCO2: 32 mmol/L (ref 22–32)
TCO2: 33 mmol/L — ABNORMAL HIGH (ref 22–32)
pCO2 arterial: 24.1 mmHg — ABNORMAL LOW (ref 32.0–48.0)
pCO2 arterial: 25.3 mmHg — ABNORMAL LOW (ref 32.0–48.0)
pCO2 arterial: 38.6 mmHg (ref 32.0–48.0)
pH, Arterial: 7.696 (ref 7.350–7.450)
pH, Arterial: 7.7 (ref 7.350–7.450)
pH, Arterial: 7.524 — ABNORMAL HIGH (ref 7.350–7.450)
pO2, Arterial: 100 mmHg (ref 83.0–108.0)
pO2, Arterial: 80 mmHg — ABNORMAL LOW (ref 83.0–108.0)
pO2, Arterial: 119 mmHg — ABNORMAL HIGH (ref 83.0–108.0)

## 2020-11-01 LAB — CBC WITH DIFFERENTIAL/PLATELET
Abs Immature Granulocytes: 0.59 10*3/uL — ABNORMAL HIGH (ref 0.00–0.07)
Basophils Absolute: 0 10*3/uL (ref 0.0–0.1)
Basophils Relative: 0 %
Eosinophils Absolute: 0 10*3/uL (ref 0.0–0.5)
Eosinophils Relative: 0 %
HCT: 23 % — ABNORMAL LOW (ref 39.0–52.0)
Hemoglobin: 7.4 g/dL — ABNORMAL LOW (ref 13.0–17.0)
Immature Granulocytes: 3 %
Lymphocytes Relative: 3 %
Lymphs Abs: 0.5 10*3/uL — ABNORMAL LOW (ref 0.7–4.0)
MCH: 26.3 pg (ref 26.0–34.0)
MCHC: 32.2 g/dL (ref 30.0–36.0)
MCV: 81.9 fL (ref 80.0–100.0)
Monocytes Absolute: 0.8 10*3/uL (ref 0.1–1.0)
Monocytes Relative: 4 %
Neutro Abs: 16.7 10*3/uL — ABNORMAL HIGH (ref 1.7–7.7)
Neutrophils Relative %: 90 %
Platelets: 74 10*3/uL — ABNORMAL LOW (ref 150–400)
RBC: 2.81 MIL/uL — ABNORMAL LOW (ref 4.22–5.81)
RDW: 17 % — ABNORMAL HIGH (ref 11.5–15.5)
WBC: 18.5 10*3/uL — ABNORMAL HIGH (ref 4.0–10.5)
nRBC: 0 % (ref 0.0–0.2)

## 2020-11-01 LAB — GLUCOSE, CAPILLARY
Glucose-Capillary: 144 mg/dL — ABNORMAL HIGH (ref 70–99)
Glucose-Capillary: 160 mg/dL — ABNORMAL HIGH (ref 70–99)
Glucose-Capillary: 186 mg/dL — ABNORMAL HIGH (ref 70–99)
Glucose-Capillary: 177 mg/dL — ABNORMAL HIGH (ref 70–99)
Glucose-Capillary: 235 mg/dL — ABNORMAL HIGH (ref 70–99)
Glucose-Capillary: 240 mg/dL — ABNORMAL HIGH (ref 70–99)

## 2020-11-01 LAB — HEMOGLOBIN AND HEMATOCRIT, BLOOD
HCT: 23.5 % — ABNORMAL LOW (ref 39.0–52.0)
HCT: 27.5 % — ABNORMAL LOW (ref 39.0–52.0)
Hemoglobin: 7.4 g/dL — ABNORMAL LOW (ref 13.0–17.0)
Hemoglobin: 8.8 g/dL — ABNORMAL LOW (ref 13.0–17.0)

## 2020-11-01 LAB — RENAL FUNCTION PANEL
Albumin: 2.7 g/dL — ABNORMAL LOW (ref 3.5–5.0)
Anion gap: 11 (ref 5–15)
BUN: 21 mg/dL (ref 8–23)
CO2: 28 mmol/L (ref 22–32)
Calcium: 7.6 mg/dL — ABNORMAL LOW (ref 8.9–10.3)
Chloride: 100 mmol/L (ref 98–111)
Creatinine, Ser: 2 mg/dL — ABNORMAL HIGH (ref 0.61–1.24)
GFR, Estimated: 37 mL/min — ABNORMAL LOW (ref >60)
Glucose, Bld: 191 mg/dL — ABNORMAL HIGH (ref 70–99)
Phosphorus: 2.7 mg/dL (ref 2.5–4.6)
Potassium: 3.3 mmol/L — ABNORMAL LOW (ref 3.5–5.1)
Sodium: 139 mmol/L (ref 135–145)

## 2020-11-01 LAB — COMPREHENSIVE METABOLIC PANEL
ALT: 46 U/L — ABNORMAL HIGH (ref 0–44)
AST: 76 U/L — ABNORMAL HIGH (ref 15–41)
Albumin: 2.9 g/dL — ABNORMAL LOW (ref 3.5–5.0)
Alkaline Phosphatase: 46 U/L (ref 38–126)
Anion gap: 15 (ref 5–15)
BUN: 19 mg/dL (ref 8–23)
CO2: 26 mmol/L (ref 22–32)
Calcium: 7.7 mg/dL — ABNORMAL LOW (ref 8.9–10.3)
Chloride: 95 mmol/L — ABNORMAL LOW (ref 98–111)
Creatinine, Ser: 2.11 mg/dL — ABNORMAL HIGH (ref 0.61–1.24)
GFR, Estimated: 35 mL/min — ABNORMAL LOW (ref >60)
Glucose, Bld: 183 mg/dL — ABNORMAL HIGH (ref 70–99)
Potassium: 3 mmol/L — ABNORMAL LOW (ref 3.5–5.1)
Sodium: 136 mmol/L (ref 135–145)
Total Bilirubin: 1.8 mg/dL — ABNORMAL HIGH (ref 0.3–1.2)
Total Protein: 6 g/dL — ABNORMAL LOW (ref 6.5–8.1)

## 2020-11-01 LAB — TRIGLYCERIDES: Triglycerides: 289 mg/dL — ABNORMAL HIGH (ref <150)

## 2020-11-01 LAB — MAGNESIUM: Magnesium: 2.3 mg/dL (ref 1.7–2.4)

## 2020-11-01 LAB — PHOSPHORUS: Phosphorus: 2.7 mg/dL (ref 2.5–4.6)

## 2020-11-01 LAB — PREPARE RBC (CROSSMATCH)

## 2020-11-01 LAB — LACTIC ACID, PLASMA: Lactic Acid, Venous: 1.6 mmol/L (ref 0.5–1.9)

## 2020-11-01 MED ORDER — VITAL 1.5 CAL PO LIQD
1000.0000 mL | ORAL | Status: DC
  Administered 2020-11-01 – 2020-11-03 (×3): 1000 mL
  Filled 2020-11-01 (×3): qty 1000

## 2020-11-01 MED ORDER — B COMPLEX-C PO TABS
1.0000 | ORAL_TABLET | Freq: Every day | ORAL | Status: DC
  Administered 2020-11-01 – 2020-11-08 (×7): 1
  Filled 2020-11-01 (×7): qty 1

## 2020-11-01 MED ORDER — PANTOPRAZOLE SODIUM 40 MG PO PACK
40.0000 mg | PACK | Freq: Every day | ORAL | Status: DC
  Administered 2020-11-01 – 2020-11-02 (×2): 40 mg
  Filled 2020-11-01 (×3): qty 20

## 2020-11-01 MED ORDER — SODIUM CHLORIDE 0.9% IV SOLUTION: Freq: Once | INTRAVENOUS | Status: AC

## 2020-11-01 MED ORDER — SODIUM CHLORIDE 0.9 % IV SOLN
2.0000 g | Freq: Two times a day (BID) | INTRAVENOUS | Status: DC
  Administered 2020-11-01 – 2020-11-03 (×5): 2 g via INTRAVENOUS
  Filled 2020-11-01 (×5): qty 20

## 2020-11-01 MED ORDER — SODIUM CHLORIDE 0.9 % IV SOLN
2.0000 g | Freq: Three times a day (TID) | INTRAVENOUS | Status: DC
  Administered 2020-11-01 – 2020-11-02 (×3): 2 g via INTRAVENOUS
  Filled 2020-11-01 (×5): qty 2000

## 2020-11-01 MED ORDER — HYDROCORTISONE NA SUCCINATE PF 100 MG IJ SOLR
50.0000 mg | Freq: Two times a day (BID) | INTRAMUSCULAR | Status: AC
  Administered 2020-11-01 – 2020-11-02 (×3): 50 mg via INTRAVENOUS
  Filled 2020-11-01 (×3): qty 2

## 2020-11-01 MED ORDER — PRISMASOL BGK 4/2.5 32-4-2.5 MEQ/L REPLACEMENT SOLN
Status: DC
  Filled 2020-11-01 (×7): qty 5000

## 2020-11-01 MED ORDER — PROSOURCE TF PO LIQD
90.0000 mL | Freq: Four times a day (QID) | ORAL | Status: DC
  Administered 2020-11-01 – 2020-11-03 (×9): 90 mL
  Filled 2020-11-01 (×9): qty 90

## 2020-11-01 MED ORDER — SODIUM CHLORIDE 0.9% FLUSH
10.0000 mL | Freq: Two times a day (BID) | INTRAVENOUS | Status: DC
  Administered 2020-11-01 – 2020-11-15 (×19): 10 mL

## 2020-11-01 MED ORDER — PRISMASOL BGK 4/2.5 32-4-2.5 MEQ/L REPLACEMENT SOLN
Status: DC
  Filled 2020-11-01 (×12): qty 5000

## 2020-11-01 MED ORDER — PROPOFOL 1000 MG/100ML IV EMUL
5.0000 ug/kg/min | INTRAVENOUS | Status: DC
  Administered 2020-11-01: 10 ug/kg/min via INTRAVENOUS
  Administered 2020-11-01: 5 ug/kg/min via INTRAVENOUS
  Administered 2020-11-02: 10 ug/kg/min via INTRAVENOUS
  Administered 2020-11-03 (×2): 20 ug/kg/min via INTRAVENOUS
  Filled 2020-11-01 (×5): qty 100

## 2020-11-01 MED ORDER — PRISMASOL BGK 4/2.5 32-4-2.5 MEQ/L EC SOLN
Status: DC
  Filled 2020-11-01 (×45): qty 5000

## 2020-11-01 MED ORDER — SODIUM CHLORIDE 0.9% FLUSH
10.0000 mL | INTRAVENOUS | Status: DC | PRN
  Administered 2020-11-05: 30 mL

## 2020-11-01 NOTE — Progress Notes (Signed)
Initial Nutrition Assessment  DOCUMENTATION CODES:   Obesity unspecified  INTERVENTION:   Tube feeding via OG tube: - Change to Vital 1.5 @ 50 ml/hr (1200 ml/day) - ProSource TF 90 ml QID  Tube feeding regimen provides 2120 kcal, 169 grams of protein, and 917 ml of H2O.  - B-complex with vitamin C to account for losses with CRRT  NUTRITION DIAGNOSIS:   Increased nutrient needs related to acute illness (acute renal failure on CRRT) as evidenced by estimated needs.  GOAL:   Patient will meet greater than or equal to 90% of their needs  MONITOR:   Vent status, Labs, Weight trends, TF tolerance, I & O's  REASON FOR ASSESSMENT:   Ventilator, Consult Enteral/tube feeding initiation and management  ASSESSMENT:   59 year old male who presented to the ED on 11/26 with emesis, fever, and blood in nephrostomy tube. 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 Pt required intubation in the ED to protect airway and was found to have prominent left renal hematoma with dislodged nephrostomy tube. Pt admitted with sepsis.   11/27 - s/p unsuccessful attempt at replacement of nephrostomy tube, CRRT initiated  Infectious Disease consulted due to E faecalis bacteremia. Per notes, this is likely urinary source. Pt remains on pressor support.  Discussed pt with RN and during ICU rounds. Consult received for tube feeding initiation and management. Pt remains on CRRT and keeping even at this time.  Per MD, pt will require TEE at some point.  OG tube in place with TF currently infusing. No issues with tolerance.  EDW: 109 kg (admit weight), BMI of 31.71  Current TF: Vital High Protein @ 40 ml/hr, ProSource TF 45 ml BID  Patient is currently intubated on ventilator support MV: 8.2 L/min Temp (24hrs), Avg:97.3 F (36.3 C),  Min:96.4 F (35.8 C), Max:97.6 F (36.4 C) BP (a-line): 109/67 MAP (a-line): 81  Drips: Fentanyl Versed Levophed Neosynephrine  Medications reviewed and include: colace, solu-cortef, SSI q 4 hours, levemir 10 units BID, protonix, miralax, IV abx  Labs reviewed: potassium 2.8, ionized calcium 1.01, elevated LFTs, hemoglobin 7.5 CBG's: 127-182 x 24 hours  UOP: 325 ml x 24 hours CRRT UF: 7049 ml x 24 hours I/O's: +10.8 L since admit  NUTRITION - FOCUSED PHYSICAL EXAM:    Most Recent Value  Orbital Region No depletion  Upper Arm Region No depletion  Thoracic and Lumbar Region No depletion  Buccal Region Unable to assess  Temple Region No depletion  Clavicle Bone Region No depletion  Clavicle and Acromion Bone Region No depletion  Scapular Bone Region Unable to assess  Dorsal Hand No depletion  Patellar Region No depletion  Anterior Thigh Region No depletion  Posterior Calf Region No depletion  Edema (RD Assessment) Moderate  [generalized]  Hair Reviewed  Eyes Unable to assess  Mouth Unable to assess  Skin Reviewed  Nails Reviewed       Diet Order:   Diet Order            Diet NPO time specified  Diet effective now                 EDUCATION NEEDS:   No education needs have been identified at this time  Skin:  Skin Assessment: Reviewed RN Assessment  Last BM:  no documented BM  Height:   Ht Readings from Last 1  Encounters:  10/30/20 6\' 1"  (1.854 m)    Weight:   Wt Readings from Last 1 Encounters:  11/01/20 124.3 kg    Ideal Body Weight:  83.6 kg  BMI:  Body mass index is 36.15 kg/m.  Estimated Nutritional Needs:   Kcal:  2096  Protein:  165-185 grams  Fluid:  2.0 L/day    Gustavus Bryant, MS, RD, LDN Inpatient Clinical Dietitian Please see [facility] for contact information.

## 2020-11-01 NOTE — Progress Notes (Signed)
Triglycerides 289 reported to Dr Erin Fulling . Will continue to monitor while on Propofol.

## 2020-11-01 NOTE — Progress Notes (Signed)
Patient ID: Brendan [name], male   DOB: February 12, 1961, 59 y.o.   MRN: [id number] S: CRRT fluids changed to 4K/2.5Ca overnight due to hypokalemia.  Remains on pressors and soft BP O:BP 104/78   Pulse 90   Temp (!) 96.4 F (35.8 C) (Axillary)   Resp 15   Ht 6\' 1"  (1.854 m)   Wt 124.3 kg   SpO2 100%   BMI 36.15 kg/m   Intake/Output Summary (Last 24 hours) at 11/01/2020 1158 Last data filed at 11/01/2020 1100 Gross per 24 hour  Intake 6535.84 ml  Output 7172 ml  Net -636.16 ml   Intake/Output: I/O last 3 completed shifts: In: 12141.4 [I.V.:9137.3; Blood:315; NG/GT:922.7; IV Piggyback:1766.4] Out: 8667 [Urine:490; XFGHW:2993]  Intake/Output this shift:  Total I/O In: 519.5 [I.V.:159.5; NG/GT:260; IV Piggyback:100] Out: 469 [Other:469] Weight change: -1.2 kg Gen: intubated and sedated CVS: RRR no rub Resp: transmitted airway sounds Abd: +BS, soft Ext: 1+ pretibial and presacral edema  Recent Labs  Lab 10/30/20 0009 10/30/20 0009 10/30/20 1038 10/30/20 1110 10/30/20 1955 10/31/20 0039 10/31/20 0452 10/31/20 0722 10/31/20 1541 11/01/20 0445 11/01/20 0554 11/01/20 0605 11/01/20 0733  NA 139   < > 140   < > 136   < > 136 137 136 136 138 139 139  K 4.3   < > 4.8   < > 6.5*   < > 5.9* 5.4* 3.8 3.0* 3.0* 2.8* 2.8*  CL 105  --  106  --  104  --  100  --  98 95*  --   --   --   CO2 23  --  16*  --  14*  --  18*  --  23 26  --   --   --   GLUCOSE 165*  --  122*  --  205*  --  260*  --  156* 183*  --   --   --   BUN 31*  --  31*  --  35*  --  31*  --  21 19  --   --   --   CREATININE 3.65*  --  4.57*  --  4.98*  --  4.36*  --  2.98* 2.11*  --   --   --   ALBUMIN 3.5  --  2.1*  --   --   --  2.9*  --  3.2* 2.9*  --   --   --   CALCIUM 8.9  --  7.8*  --  7.6*  --  7.4*  --  7.6* 7.7*  --   --   --   PHOS  --   --   --   --   --   --  3.0  --  2.0* 2.7  --   --   --   AST 28  --  55*  --   --   --  82*  --   --  76*  --   --   --   ALT 28  --  32  --   --   --  44  --   --  46*  --    --   --    < > = values in this interval not displayed.   Liver Function Tests: Recent Labs  Lab 10/30/20 1038 10/30/20 1038 10/31/20 0452 10/31/20 1541 11/01/20 0445  AST 55*  --  82*  --  76*  ALT 32  --  44  --  46*  ALKPHOS 49  --  36*  --  46  BILITOT 0.8  --  1.4*  --  1.8*  PROT 4.7*  --  5.5*  --  6.0*  ALBUMIN 2.1*   < > 2.9* 3.2* 2.9*   < > = values in this interval not displayed.   Recent Labs  Lab 10/30/20 0009  LIPASE 26   No results for input(s): AMMONIA in the last 168 hours. CBC: Recent Labs  Lab 10/30/20 0009 10/30/20 0009 10/30/20 1038 10/30/20 1110 10/30/20 1955 10/31/20 0039 10/31/20 0452 10/31/20 0722 10/31/20 0745 10/31/20 1254 11/01/20 0445 11/01/20 0445 11/01/20 0554 11/01/20 0605 11/01/20 0733  WBC 1.7*   < > 21.1*   < > 29.8*  --  21.9*  --   --   --  18.5*  --   --   --   --   NEUTROABS 1.4*  --   --   --   --   --  19.8*  --   --   --  16.7*  --   --   --   --   HGB 12.9*   < > 7.3*   < > 8.4*   < > 7.5*   < >  --    < > 7.4*   < > 7.8* 7.8* 7.5*  HCT 43.9   < > 24.5*   < > 27.0*   < > 24.1*   < >  --    < > 23.0*   < > 23.0* 23.0* 22.0*  MCV 88.5  --  87.8  --  84.9  --  84.9  --   --   --  81.9  --   --   --   --   PLT 173   < > 158   < > 118*   < > 88*  --  73*  --  74*  --   --   --   --    < > = values in this interval not displayed.   Cardiac Enzymes: No results for input(s): CKTOTAL, CKMB, CKMBINDEX, TROPONINI in the last 168 hours. CBG: Recent Labs  Lab 10/31/20 1532 10/31/20 1920 10/31/20 2316 11/01/20 0309 11/01/20 0721  GLUCAP 150* 127* 182* 144* 160*    Iron Studies: No results for input(s): IRON, TIBC, TRANSFERRIN, FERRITIN in the last 72 hours. Studies/Results: CT ABDOMEN PELVIS WO CONTRAST  Result Date: 10/31/2020 CLINICAL DATA:  Follow-up left perinephric hematoma. EXAM: CT ABDOMEN AND PELVIS WITHOUT CONTRAST TECHNIQUE: Multidetector CT imaging of the abdomen and pelvis was performed following the  standard protocol without IV contrast. COMPARISON:  10/30/2020 FINDINGS: Lower chest: Small bilateral pleural effusions with overlying subsegmental atelectasis. Hepatobiliary: No focal liver abnormality identified. Gallbladder unremarkable. No signs of biliary ductal dilatation Pancreas: Unremarkable. No pancreatic ductal dilatation or surrounding inflammatory changes. Spleen: Normal in size without focal abnormality. Adrenals/Urinary Tract: Normal appearance of the adrenal glands. Asymmetric atrophy of the right kidney is again noted. Excreted contrast material is identified within the right renal collecting system. Left kidney subcapsular hematoma is again noted. This measures 10.7 x 7.1 cm, image 60/6. Previously 10.4 x 6.6 cm. There is surrounding perinephric hemorrhage with signs of mildly progressive hemoperitoneum. Collection of high attenuation material anterior to the mid right kidney is identified measuring 4.6 x 2.7 cm, image 42/3 and likely represents extravasated contrast material from attempted percutaneous nephrostomy tube placement performed yesterday. Concentrated IV contrast material noted within the urinary bladder.  Stomach/Bowel: There is an indwelling nasogastric tube with tip in the distal stomach. High contrast material noted within the stomach and proximal duodenum. No dilated loops of large or small bowel. Vascular/Lymphatic: Aortic atherosclerosis. 3.5 cm infrarenal abdominal aortic aneurysm is again noted. 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 Reproductive: Prostate is unremarkable. Other: High attenuation peritoneal fluid consistent with hemoperitoneum. This appears increased in volume when compared with 10/30/2020. Musculoskeletal: No acute or significant osseous findings. IMPRESSION: 1. Mild increase in size of left kidney subcapsular hematoma with signs of  progressive hemoperitoneum. Hemoperitoneum is again noted and is also mildly increased in volume from previous study. 2. Collection of high attenuation material anterior to the mid right kidney likely represents extravasated contrast material. 3. Signs of retroperitoneal fibrosis with increased retroperitoneal soft tissue surrounding the abdominal aorta and encasing bilateral ureters. 4. Small bilateral pleural effusions with overlying subsegmental atelectasis. 5. 3.5 cm infrarenal abdominal aortic aneurysm. Aortic aneurysm NOS (ICD10-I71.9). 6. Aortic atherosclerosis. Aortic Atherosclerosis (ICD10-I70.0). Critical Value/emergent results were called by telephone at the time of interpretation on 10/31/2020 at 10:17 am to provider [facility] , who verbally acknowledged these results. Electronically Signed   By: Kerby Moors M.D.   On: 10/31/2020 10:18   CT HEAD WO CONTRAST  Result Date: 10/31/2020 CLINICAL DATA:  Cerebral hemorrhage. EXAM: CT HEAD WITHOUT CONTRAST TECHNIQUE: Contiguous axial images were obtained from the base of the skull through the vertex without intravenous contrast. COMPARISON:  CT head November 27 21. FINDINGS: Brain: Postsurgical changes of left left-sided craniotomy. Similar size of an underlying subdural hemorrhage, which measures up to 1.4 cm in thickness. Single locule within this collection, unchanged. Similar associated mass effect without substantial midline shift. Similar appearance of encephalomalacia within the left frontal lobe white matter. No hydrocephalus. No evidence of new/acute large vascular territory infarct. Vascular: Calcific atherosclerosis. Skull: Left-sided craniotomy. Sinuses/Orbits: Similar complete opacification of bilateral maxillary sinuses with mineralization in the left maxillary sinus. Maxillary sinus opacification is mildly expansile with surrounding bony thickening, suggesting chronicity. Mucosal thickening of ethmoid air cells and sphenoid sinuses.  Other: No mastoid effusions. IMPRESSION: 1. No substantial change in left left frontal craniotomy with subjacent 1.4 cm thick underlying subdural hematoma. Similar so shaded mass effect. 2. Left frontal e white matter encephalomalacia. 3. Similar findings of chronic bilateral maxillary sinusitis, as detailed above. 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 EXAM: 1. Fluoroscopic guided left nephrostomy tube check 2. Attempted fluoroscopic and ultrasound-guided nephrostomy track recanalization 3. Limited CT abdomen 4. Attempted CT-guided nephrostomy placement COMPARISON:  CT abdomen pelvis from 10/30/2020 and IR nephrostomy exchange from 08/19/2020 CONTRAST:  80 mL Omnipaque, intravenous FLUOROSCOPY TIME:  Minutes   seconds COMPLICATIONS: None immediate. TECHNIQUE: Informed written consent was obtained from the patient's sister after a discussion of the risks, benefits and alternatives to treatment. Questions regarding the procedure were encouraged and answered. A timeout was performed prior to the initiation of the procedure. The left flank and external portion of existing nephrostomy catheter were prepped and draped in the usual sterile fashion. A sterile drape was applied covering the operative field. Maximum barrier sterile technique with sterile gowns and gloves  were used for the procedure. A timeout was performed prior to the initiation of the procedure. A pre procedural spot fluoroscopic image was obtained after contrast was injected via the existing nephrostomy catheter demonstrating withdrawal from the renal parenchyma into the pericapsular space. The tract was attempted to be recannulated  with a Glidewire and Kumpe the catheter which was unable to be passed into the collecting system. Due to the coiled nature of the indwelling nephrostomy, it was thought that the pigtail portion may be blocking the entry site of the established tract into the kidney, therefore the indwelling catheter was removed. After additional manipulation of the catheter and wire combination, recanalization of the tract was unable to be performed. Ultrasound was then used to examine the left flank for potential new nephrostomy tube placement. Due to body habitus and overlying gas, the left kidney was not visible by ultrasound. The decision was made to transfer the patient to the CT scanner for further attempt. The patient was positioned prone on the CT scanner. The left flank was prepped and draped in standard fashion. A sterile drape was applied covering the operative field. An additional time-out was performed prior to initiation of this portion of the procedure. Limited CT of the abdomen demonstrated trace residual contrast within the existing nephrostomy tract and contrast layering along the anterior aspect of the renal capsule with partial extension into the peritoneum. There is no evidence of hydronephrosis. The procedure was planned with CT. At the needle entry site, local anesthesia was administered 1% lidocaine. Deeper local anesthesia was provided to the oblique musculature with a 22 gauge spinal needle. A 22 gauge Chiba needle was then directed under intermittent fluoroscopic CT guidance to the level of the collecting system. After numerous attempts, the collecting system was unable to be cannulated. Gentle hand injection of contrast was unable to opacify the collecting system convincingly. A wire would not pass into the collecting system. After permission of the ICU team in light of the patient's renal function, 80 mL Omnipaque 300 were injected intravenously through the patient is central catheter to perform a CT  IVP. Limited sequence imaging at the level of the inferior pole of the kidney was acquired at 1, 2, 5, and 10 minutes. There is no evidence of excretion of contrast material within the collecting system. At this point, the procedure was terminated and the patient was returned to the intensive care unit. The patient tolerated the procedure well without immediate postprocedural complication. FINDINGS: The existing nephrostomy catheter is withdrawn into the perirenal space. Unsuccessful attempt at established track recanalization with fluoroscopic or ultrasound guidance. Unsuccessful attempt at percutaneous nephrostomy tube placement with CT guidance. CT IVP demonstrates severely diminished renal function without nephrogenic phase or excretion of contrast in the collecting system after 10 minutes. There is no evidence of hydronephrosis. IMPRESSION: 1. Indwelling nephrostomy tube retracted into the perirenal space. 2. Unsuccessful attempt at established nephrostomy track recanalization under fluoroscopic ultrasound guidance. 3. Unsuccessful attempt at percutaneous nephrostomy tube placement under CT guidance due to decompression of the collecting system. Limited CT abdomen demonstrates severely diminished renal function without nephrogenic phase or collecting system target due to no evidence of contrast material excretion. PLAN: Repeat noncontrast CT abdomen in 24-48 hours to evaluate for evidence of hydronephrosis and potential target for replacement of right nephrostomy tube. Ruthann Cancer, MD [facility] Radiology Electronically Signed   By: Ruthann Cancer MD   On: 10/31/2020 08:31   DG CHEST PORT 1 VIEW  Result Date: 10/30/2020 CLINICAL DATA:  Central venous catheter placement EXAM: PORTABLE CHEST 1 VIEW COMPARISON:  10/30/2020 FINDINGS: Endotracheal and enteric tubes as well as right central venous catheter are unchanged in position. A new left central venous  catheter has been placed with tip over the mid SVC region, oriented towards the side wall of the vessel. No pneumothorax. Shallow inspiration with atelectasis in the lung bases. Mild cardiac enlargement. No pleural effusions. IMPRESSION: New left central venous catheter with tip over the mid SVC region, oriented towards the side wall of the vessel. No pneumothorax. Otherwise stable appearance of the chest. Electronically Signed   By: Lucienne Capers M.D.   On: 10/30/2020 22:58   ECHOCARDIOGRAM COMPLETE  Result Date: 10/31/2020    ECHOCARDIOGRAM REPORT   Patient Name:   RAEF SPRIGG  Date of Exam: 10/31/2020 Medical Rec #:  [id number]  Height:       73.0 in Accession #:    [id number] Weight:       276.7 lb Date of Birth:  21-Feb-1961 BSA:          2.470 m Patient Age:    9 years   BP:           116/73 mmHg Patient Gender: M          HR:           77 bpm. Exam Location:  Inpatient Procedure: 2D Echo, Cardiac Doppler and Color Doppler Indications:    Endocarditis I38  History:        Patient has no prior history of Echocardiogram examinations.                 Risk Factors:Hypertension and Dyslipidemia. Septic shock,AKI                 (acute kidney injury),Right leg DVT.  Sonographer:    Alvino Chapel RCS Referring Phys: [id number] Candee Furbish  Sonographer Comments: Patient on continuous dialysis and mechancial ventilator during echo. IMPRESSIONS  1. Left ventricular ejection fraction, by estimation, is 65 to 70%. The left ventricle has normal function. The left ventricle has no regional wall motion abnormalities. Left ventricular diastolic parameters are consistent with Grade I diastolic dysfunction (impaired relaxation).  2. Right ventricular systolic function is normal. The right ventricular size is normal.  3. The mitral valve is abnormal. Trivial mitral valve regurgitation.  4. Possible aortic valve vegetation is visualized on the noncoronary cusp vs sclerosis and motion artifact. There is no associated aortic  insufficiency. Further imaging with TEE is recommended.  5. Possible small aortic valve mass on the noncoronary cusp which is echogenic.  6. The aortic valve is tricuspid. Aortic valve regurgitation is not visualized. Conclusion(s)/Recommendation(s): Possible valvular vegetation noted on the non-coronary cusp of the aortic valve on this transthoracic echocardiogram. Would recommend a transesophageal echocardiogram to exclude infective endocarditis if clinically indicated. FINDINGS  Left Ventricle: Left ventricular ejection fraction, by estimation, is 65 to 70%. The left ventricle has normal function. The left ventricle has no regional wall motion abnormalities. The left ventricular internal cavity size was normal in size. There is  no left ventricular hypertrophy. Left ventricular diastolic parameters are consistent with Grade I diastolic dysfunction (impaired relaxation). Indeterminate filling pressures. Right Ventricle: The right ventricular size is normal. No increase in right ventricular wall thickness. Right ventricular systolic function is normal. Left Atrium: Left atrial size was normal in size. Right Atrium: Right atrial size was normal in size. Pericardium: There is no evidence  of pericardial effusion. Mitral Valve: The mitral valve is abnormal. There is mild thickening of the mitral valve leaflet(s). Trivial mitral valve regurgitation. Tricuspid Valve: The tricuspid valve is grossly normal. Tricuspid valve regurgitation is trivial. Aortic Valve: The aortic valve is tricuspid. Aortic valve regurgitation is not visualized. A vegetation is seen on the noncoronary. There is a small aortic valve mass seen on the noncoronary cusp. The AoV mass measures 5 mm x 5 mm. Pulmonic Valve: The pulmonic valve was grossly normal. Pulmonic valve regurgitation is trivial. Aorta: The aortic root and ascending aorta are structurally normal, with no evidence of dilitation. Venous: IVC assessment for right atrial pressure unable  to be performed due to mechanical ventilation. IAS/Shunts: No atrial level shunt detected by color flow Doppler.  LEFT VENTRICLE PLAX 2D LVIDd:         4.60 cm  Diastology LVIDs:         2.50 cm  LV e' medial:    6.31 cm/s LV PW:         1.00 cm  LV E/e' medial:  8.7 LV IVS:        1.00 cm  LV e' lateral:   7.62 cm/s LVOT diam:     2.10 cm  LV E/e' lateral: 7.2 LV SV:         48 LV SV Index:   20 LVOT Area:     3.46 cm  RIGHT VENTRICLE RV S prime:     12.70 cm/s TAPSE (M-mode): 2.3 cm LEFT ATRIUM             Index       RIGHT ATRIUM           Index LA diam:        3.90 cm 1.58 cm/m  RA Area:     16.50 cm LA Vol (A2C):   38.5 ml 15.59 ml/m RA Volume:   42.10 ml  17.05 ml/m LA Vol (A4C):   24.9 ml 10.08 ml/m LA Biplane Vol: 31.7 ml 12.83 ml/m  AORTIC VALVE LVOT Vmax:   81.50 cm/s LVOT Vmean:  47.800 cm/s LVOT VTI:    0.140 m  AORTA Ao Root diam: 3.70 cm MITRAL VALVE               TRICUSPID VALVE MV Area (PHT): 3.17 cm    TR Peak grad:   13.5 mmHg MV Decel Time: 239 msec    TR Vmax:        184.00 cm/s MV E velocity: 54.80 cm/s MV A velocity: 84.70 cm/s  SHUNTS MV E/A ratio:  0.65        Systemic VTI:  0.14 m                            Systemic Diam: 2.10 cm Lyman Bishop MD Electronically signed by Lyman Bishop MD Signature Date/Time: 10/31/2020/3:59:55 PM    Final    IR NEPHROSTOMY PLACEMENT LEFT  Result Date: 10/31/2020 INDICATION: 59 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 EXAM: 1. Fluoroscopic guided left nephrostomy tube check 2. Attempted fluoroscopic and ultrasound-guided nephrostomy track recanalization 3. Limited CT abdomen 4. Attempted CT-guided nephrostomy placement COMPARISON:  CT abdomen pelvis from 10/30/2020 and IR nephrostomy exchange from 08/19/2020 CONTRAST:  80 mL Omnipaque, intravenous FLUOROSCOPY TIME:  Minutes    seconds COMPLICATIONS:  None immediate. TECHNIQUE: Informed written consent was obtained from the patient's sister after a discussion of the risks, benefits and alternatives to treatment. Questions regarding the procedure were encouraged and answered. A timeout was performed prior to the initiation of the procedure. The left flank and external portion of existing nephrostomy catheter were prepped and draped in the usual sterile fashion. A sterile drape was applied covering the operative field. Maximum barrier sterile technique with sterile gowns and gloves were used for the procedure. A timeout was performed prior to the initiation of the procedure. A pre procedural spot fluoroscopic image was obtained after contrast was injected via the existing nephrostomy catheter demonstrating withdrawal from the renal parenchyma into the pericapsular space. The tract was attempted to be recannulated with a Glidewire and Kumpe the catheter which was unable to be passed into the collecting system. Due to the coiled nature of the indwelling nephrostomy, it was thought that the pigtail portion may be blocking the entry site of the established tract into the kidney, therefore the indwelling catheter was removed. After additional manipulation of the catheter and wire combination, recanalization of the tract was unable to be performed. Ultrasound was then used to examine the left flank for potential new nephrostomy tube placement. Due to body habitus and overlying gas, the left kidney was not visible by ultrasound. The decision was made to transfer the patient to the CT scanner for further attempt. The patient was positioned prone on the CT scanner. The left flank was prepped and draped in standard fashion. A sterile drape was applied covering the operative field. An additional time-out was performed prior to initiation of this portion of the procedure. Limited CT of the abdomen demonstrated trace residual contrast within the existing  nephrostomy tract and contrast layering along the anterior aspect of the renal capsule with partial extension into the peritoneum. There is no evidence of hydronephrosis. The procedure was planned with CT. At the needle entry site, local anesthesia was administered 1% lidocaine. Deeper local anesthesia was provided to the oblique musculature with a 22 gauge spinal needle. A 22 gauge Chiba needle was then directed under intermittent fluoroscopic CT guidance to the level of the collecting system. After numerous attempts, the collecting system was unable to be cannulated. Gentle hand injection of contrast was unable to opacify the collecting system convincingly. A wire would not pass into the collecting system. After permission of the ICU team in light of the patient's renal function, 80 mL Omnipaque 300 were injected intravenously through the patient is central catheter to perform a CT IVP. Limited sequence imaging at the level of the inferior pole of the kidney was acquired at 1, 2, 5, and 10 minutes. There is no evidence of excretion of contrast material within the collecting system. At this point, the procedure was terminated and the patient was returned to the intensive care unit. The patient tolerated the procedure well without immediate postprocedural complication. FINDINGS: The existing nephrostomy catheter is withdrawn into the perirenal space. Unsuccessful attempt at established track recanalization with fluoroscopic or ultrasound guidance. Unsuccessful attempt at percutaneous nephrostomy tube placement with CT guidance. CT IVP demonstrates severely diminished renal function without nephrogenic phase or excretion of contrast in the collecting system after 10 minutes. There is no evidence of hydronephrosis. IMPRESSION: 1. Indwelling nephrostomy tube retracted into the perirenal space. 2. Unsuccessful attempt at established nephrostomy track recanalization under fluoroscopic ultrasound guidance. 3. Unsuccessful  attempt at percutaneous nephrostomy tube placement under CT guidance due to decompression of the  collecting system. Limited CT abdomen demonstrates severely diminished renal function without nephrogenic phase or collecting system target due to no evidence of contrast material excretion. PLAN: Repeat noncontrast CT abdomen in 24-48 hours to evaluate for evidence of hydronephrosis and potential target for replacement of right nephrostomy tube. Ruthann Cancer, MD [facility] Radiology Electronically Signed   By: Ruthann Cancer MD   On: 10/31/2020 08:31   . B-complex with vitamin C  1 tablet Per Tube Daily  . docusate  100 mg Per Tube BID  . feeding supplement (PROSource TF)  90 mL Per Tube QID  . hydrocortisone sod succinate (SOLU-CORTEF) inj  50 mg Intravenous Q12H  . insulin aspart  0-6 Units Subcutaneous Q4H  . insulin detemir  10 Units Subcutaneous BID  . mouth rinse  15 mL Mouth Rinse 10 times per day  . pantoprazole sodium  40 mg Per Tube QHS  . polyethylene glycol  17 g Per Tube Daily    BMET    Component Value Date/Time   NA 139 11/01/2020 0733   K 2.8 (L) 11/01/2020 0733   CL 95 (L) 11/01/2020 0445   CO2 26 11/01/2020 0445   GLUCOSE 183 (H) 11/01/2020 0445   BUN 19 11/01/2020 0445   CREATININE 2.11 (H) 11/01/2020 0445   CALCIUM 7.7 (L) 11/01/2020 0445   GFRNONAA 35 (L) 11/01/2020 0445   GFRAA 26 (L) 04/21/2020 1300   CBC    Component Value Date/Time   WBC 18.5 (H) 11/01/2020 0445   RBC 2.81 (L) 11/01/2020 0445   HGB 7.5 (L) 11/01/2020 0733   HCT 22.0 (L) 11/01/2020 0733   PLT 74 (L) 11/01/2020 0445   MCV 81.9 11/01/2020 0445   MCH 26.3 11/01/2020 0445   MCHC 32.2 11/01/2020 0445   RDW 17.0 (H) 11/01/2020 0445   LYMPHSABS 0.5 (L) 11/01/2020 0445   MONOABS 0.8 11/01/2020 0445   EOSABS 0.0 11/01/2020 0445   BASOSABS 0.0 11/01/2020 0445     Assessment/Plan:  1. 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   1. Started on CRRT due to severe oliguric AKI on 10/30/20. 2. Dialysate and replacement fluids changed to 4K due to hypokalemia 3. Pre-filter 500 ml/hr, post filter 300 ml/hr, dialysate 2,000 ml/hr 4. UF goal to keep even for now given hypotension 2. Severe Septic shock from Enterococcus faecalis bacteremia- presumably urinary source.  Abx per ID and primary team. 3. 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  IR following.  Consider urology consult to help with treatment options. 4. Left perinephric hematoma from dislodged nephrostomy tube. 5. Thrombocytopenia- likely due to critical illness.  Anticoagulation on hold.  Follow platelets. 6. Hyperkalemia- improved with CRRT initiation on 10/30/20.  7. Hypophosphatemia- follow and replace as needed. 8. Acute hypoxemic respiratory failure- s/p intubation and currently on vent per PCCM. 9. AMS- EEG today.  Not responding despite decreased sedation.  Donetta Potts, MD [employer] [phone]

## 2020-11-01 NOTE — Progress Notes (Signed)
[place] for Infectious Disease  Date of Admission:  10/29/2020     Current antibiotics: Day 1 amp + CTX (11/29--pres)  Previous antibiotics: Cefepime x 1 (11/27) Cipro x 1 (11/27) Vancomycin (11/27-11/28) Meropenem (11/27-11/28) Zosyn (11/28-11/29)       Reason for visit: Follow up on E faecalis bacteremia  ASSESSMENT:    # Enterococcus faecalis bacteremia and septic shock: likely urinary source given below.  TTE with possible aortic valve vegetation # Retroperitoneal fibrosis/acute obstruction related to dislodged left PCN tube.  Unsuccessful exchange attempt per IR complicated by subcapsular hematoma and progressive hemoperitoneum by CT scan.  Urology eval pending # Left kidney subscapular hematoma 2/2 dislodged nephrostomy tube # Acute renal failure requiring CRRT # Hx of ESBL    PLAN:    -- will change to ceftriaxone and ampicillin given TTE findings due to c/f endocarditis -- TEE when able  -- contact precautions with history of ESBL  MEDICATIONS:    Scheduled Meds: . sodium chloride   Intravenous Once  . B-complex with vitamin C  1 tablet Per Tube Daily  . docusate  100 mg Per Tube BID  . feeding supplement (PROSource TF)  90 mL Per Tube QID  . hydrocortisone sod succinate (SOLU-CORTEF) inj  50 mg Intravenous Q12H  . insulin aspart  0-6 Units Subcutaneous Q4H  . insulin detemir  10 Units Subcutaneous BID  . mouth rinse  15 mL Mouth Rinse 10 times per day  . pantoprazole sodium  40 mg Per Tube QHS  . polyethylene glycol  17 g Per Tube Daily    Continuous Infusions: .  prismasol BGK 4/2.5 500 mL/hr at 11/01/20 0817  .  prismasol BGK 4/2.5 300 mL/hr at 11/01/20 0816  . sodium chloride 10 mL/hr at 11/01/20 1600  . sodium chloride    . ampicillin (OMNIPEN) IV Stopped (11/01/20 1147)  . cefTRIAXone (ROCEPHIN)  IV Stopped (11/01/20 0944)  . feeding supplement (VITAL 1.5 CAL) 1,000 mL (11/01/20 1354)  . fentaNYL infusion INTRAVENOUS 50 mcg/hr  (11/01/20 1600)  . norepinephrine (LEVOPHED) Adult infusion 12 mcg/min (11/01/20 1600)  . phenylephrine (NEO-SYNEPHRINE) Adult infusion 50 mcg/min (11/01/20 1600)  . prismasol BGK 4/2.5 2,000 mL/hr at 11/01/20 1527  . propofol (DIPRIVAN) infusion 10 mcg/kg/min (11/01/20 1600)    PRN Meds: fentaNYL, heparin, iohexol, lidocaine, sodium chloride  SUBJECTIVE:   Unable to obtain subjective or ROS due to intubated and sedated status.    OBJECTIVE:   Allergies  Allergen Reactions  . Chlorhexidine   . Other     TB serum-reaction unknown. Provided via [facility] records     Blood pressure 97/65, pulse 91, temperature (P) 98.2 F (36.8 C), temperature source (P) Axillary, resp. rate 14, height 6\' 1"  (1.854 m), weight 124.3 kg, SpO2 100 %. Body mass index is 36.15 kg/m.  Physical Exam General: Patient is currently intubated and sedated on the ventilator. Getting EEG Head: Normocephalic and atraumatic.  ET tube in place Neck: Supple, right and left central lines Cardiovascular: RRR Pulmonary/Chest: Breathing assisted on vent; coarse breath sounds anteriorly. Abdominal: Soft, non-tender, non-distended Musculoskeletal: No joint deformities Extremities: No swelling or edema. No cyanosis or clubbing. Neurological: Sedated, unresponsive. Skin: Warm, dry and intact. Dry skin with bilateral evidence of venous stasis   Lab Results & Microbiology Lab Results  Component Value Date   WBC 18.5 (H) 11/01/2020   HGB 7.4 (L) 11/01/2020   HCT 23.5 (L) 11/01/2020   MCV 81.9 11/01/2020   PLT  74 (L) 11/01/2020    Lab Results  Component Value Date   NA 139 11/01/2020   K 3.3 (L) 11/01/2020   CO2 28 11/01/2020   GLUCOSE 191 (H) 11/01/2020   BUN 21 11/01/2020   CREATININE 2.00 (H) 11/01/2020   CALCIUM 7.6 (L) 11/01/2020   GFRNONAA 37 (L) 11/01/2020   GFRAA 26 (L) 04/21/2020    Lab Results  Component Value Date   ALT 46 (H) 11/01/2020   AST 76 (H) 11/01/2020   ALKPHOS 46 11/01/2020    BILITOT 1.8 (H) 11/01/2020     I have reviewed the micro and lab results in Epic.  Imaging EEG  Result Date: 11/01/2020 Lora Havens, MD     11/01/2020 12:54 PM [name] MRN: [id number] Epilepsy Attending: Lora Havens Referring Physician/Provider: Dr. Ina Homes Date: 11/01/2020 Duration: 23.15 minutes Patient history: 59 year old male with altered mental status and questionable seizure activity.  EEG to evaluate for seizures. Level of alertness: Comatose AEDs during EEG study: None Technical aspects: This EEG study was done with scalp electrodes positioned according to the 10-20 International system of electrode placement. Electrical activity was acquired at a sampling rate of 500Hz  and reviewed with a high frequency filter of 70Hz  and a low frequency filter of 1Hz . EEG data were recorded continuously and digitally stored. Description: EEG showed continuous generalized 6 to 7 Hz theta slowing as well as intermittent generalized 2 to 3 Hz delta slowing.  Sharp transients were seen in vertex region.  Hyperventilation and photic stimulation were not performed.   ABNORMALITY -Continuous slow, generalized IMPRESSION: This study is suggestive of severe diffuse encephalopathy, nonspecific etiology.  No seizures or definite epileptiform discharges were seen throughout the recording. Priyanka Barbra Sarks   CT ABDOMEN PELVIS WO CONTRAST  Result Date: 10/31/2020 CLINICAL DATA:  Follow-up left perinephric hematoma. EXAM: CT ABDOMEN AND PELVIS WITHOUT CONTRAST TECHNIQUE: Multidetector CT imaging of the abdomen and pelvis was performed following the standard protocol without IV contrast. COMPARISON:  10/30/2020 FINDINGS: Lower chest: Small bilateral pleural effusions with overlying subsegmental atelectasis. Hepatobiliary: No focal liver abnormality identified. Gallbladder unremarkable. No signs of biliary ductal dilatation Pancreas: Unremarkable. No pancreatic ductal dilatation or surrounding  inflammatory changes. Spleen: Normal in size without focal abnormality. Adrenals/Urinary Tract: Normal appearance of the adrenal glands. Asymmetric atrophy of the right kidney is again noted. Excreted contrast material is identified within the right renal collecting system. Left kidney subcapsular hematoma is again noted. This measures 10.7 x 7.1 cm, image 60/6. Previously 10.4 x 6.6 cm. There is surrounding perinephric hemorrhage with signs of mildly progressive hemoperitoneum. Collection of high attenuation material anterior to the mid right kidney is identified measuring 4.6 x 2.7 cm, image 42/3 and likely represents extravasated contrast material from attempted percutaneous nephrostomy tube placement performed yesterday. Concentrated IV contrast material noted within the urinary bladder. Stomach/Bowel: There is an indwelling nasogastric tube with tip in the distal stomach. High contrast material noted within the stomach and proximal duodenum. No dilated loops of large or small bowel. Vascular/Lymphatic: Aortic atherosclerosis. 3.5 cm infrarenal abdominal aortic aneurysm is again noted. 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 Reproductive: Prostate is unremarkable. Other: High attenuation peritoneal fluid consistent with hemoperitoneum. This appears increased in volume when compared with 10/30/2020. Musculoskeletal: No acute or significant osseous findings. IMPRESSION: 1. Mild increase in size of left kidney subcapsular hematoma with signs of progressive hemoperitoneum. Hemoperitoneum is  again noted and is also mildly increased in volume from previous study. 2. Collection of high attenuation material anterior to the mid right kidney likely represents extravasated contrast material. 3. Signs of retroperitoneal fibrosis with increased retroperitoneal soft tissue surrounding the abdominal aorta  and encasing bilateral ureters. 4. Small bilateral pleural effusions with overlying subsegmental atelectasis. 5. 3.5 cm infrarenal abdominal aortic aneurysm. Aortic aneurysm NOS (ICD10-I71.9). 6. Aortic atherosclerosis. Aortic Atherosclerosis (ICD10-I70.0). Critical Value/emergent results were called by telephone at the time of interpretation on 10/31/2020 at 10:17 am to provider [facility] , who verbally acknowledged these results. Electronically Signed   By: Kerby Moors M.D.   On: 10/31/2020 10:18   CT HEAD WO CONTRAST  Result Date: 10/31/2020 CLINICAL DATA:  Cerebral hemorrhage. EXAM: CT HEAD WITHOUT CONTRAST TECHNIQUE: Contiguous axial images were obtained from the base of the skull through the vertex without intravenous contrast. COMPARISON:  CT head November 27 21. FINDINGS: Brain: Postsurgical changes of left left-sided craniotomy. Similar size of an underlying subdural hemorrhage, which measures up to 1.4 cm in thickness. Single locule within this collection, unchanged. Similar associated mass effect without substantial midline shift. Similar appearance of encephalomalacia within the left frontal lobe white matter. No hydrocephalus. No evidence of new/acute large vascular territory infarct. Vascular: Calcific atherosclerosis. Skull: Left-sided craniotomy. Sinuses/Orbits: Similar complete opacification of bilateral maxillary sinuses with mineralization in the left maxillary sinus. Maxillary sinus opacification is mildly expansile with surrounding bony thickening, suggesting chronicity. Mucosal thickening of ethmoid air cells and sphenoid sinuses. Other: No mastoid effusions. IMPRESSION: 1. No substantial change in left left frontal craniotomy with subjacent 1.4 cm thick underlying subdural hematoma. Similar so shaded mass effect. 2. Left frontal e white matter encephalomalacia. 3. Similar findings of chronic bilateral maxillary sinusitis, as detailed above. Electronically Signed   By: Margaretha Sheffield MD   On: 10/31/2020 09:51   DG CHEST PORT 1 VIEW  Result Date: 10/30/2020 CLINICAL DATA:  Central venous catheter placement EXAM: PORTABLE CHEST 1 VIEW COMPARISON:  10/30/2020 FINDINGS: Endotracheal and enteric tubes as well as right central venous catheter are unchanged in position. A new left central venous catheter has been placed with tip over the mid SVC region, oriented towards the side wall of the vessel. No pneumothorax. Shallow inspiration with atelectasis in the lung bases. Mild cardiac enlargement. No pleural effusions. IMPRESSION: New left central venous catheter with tip over the mid SVC region, oriented towards the side wall of the vessel. No pneumothorax. Otherwise stable appearance of the chest. Electronically Signed   By: Lucienne Capers M.D.   On: 10/30/2020 22:58   ECHOCARDIOGRAM COMPLETE  Result Date: 10/31/2020    ECHOCARDIOGRAM REPORT   Patient Name:   Brendan [name]  Date of Exam: 10/31/2020 Medical Rec #:  [id number]  Height:       73.0 in Accession #:    [id number] Weight:       276.7 lb Date of Birth:  09-05-1961 BSA:          2.470 m Patient Age:    59 years   BP:           116/73 mmHg Patient Gender: M          HR:           77 bpm. Exam Location:  Inpatient Procedure: 2D Echo, Cardiac Doppler and Color Doppler Indications:    Endocarditis I38  History:        Patient has no prior history of Echocardiogram examinations.  Risk Factors:Hypertension and Dyslipidemia. Septic shock,AKI                 (acute kidney injury),Right leg DVT.  Sonographer:    Alvino Chapel RCS Referring Phys: [id number] Candee Furbish  Sonographer Comments: Patient on continuous dialysis and mechancial ventilator during echo. IMPRESSIONS  1. Left ventricular ejection fraction, by estimation, is 65 to 70%. The left ventricle has normal function. The left ventricle has no regional wall motion abnormalities. Left ventricular diastolic parameters are consistent with Grade I diastolic dysfunction  (impaired relaxation).  2. Right ventricular systolic function is normal. The right ventricular size is normal.  3. The mitral valve is abnormal. Trivial mitral valve regurgitation.  4. Possible aortic valve vegetation is visualized on the noncoronary cusp vs sclerosis and motion artifact. There is no associated aortic insufficiency. Further imaging with TEE is recommended.  5. Possible small aortic valve mass on the noncoronary cusp which is echogenic.  6. The aortic valve is tricuspid. Aortic valve regurgitation is not visualized. Conclusion(s)/Recommendation(s): Possible valvular vegetation noted on the non-coronary cusp of the aortic valve on this transthoracic echocardiogram. Would recommend a transesophageal echocardiogram to exclude infective endocarditis if clinically indicated. FINDINGS  Left Ventricle: Left ventricular ejection fraction, by estimation, is 65 to 70%. The left ventricle has normal function. The left ventricle has no regional wall motion abnormalities. The left ventricular internal cavity size was normal in size. There is  no left ventricular hypertrophy. Left ventricular diastolic parameters are consistent with Grade I diastolic dysfunction (impaired relaxation). Indeterminate filling pressures. Right Ventricle: The right ventricular size is normal. No increase in right ventricular wall thickness. Right ventricular systolic function is normal. Left Atrium: Left atrial size was normal in size. Right Atrium: Right atrial size was normal in size. Pericardium: There is no evidence of pericardial effusion. Mitral Valve: The mitral valve is abnormal. There is mild thickening of the mitral valve leaflet(s). Trivial mitral valve regurgitation. Tricuspid Valve: The tricuspid valve is grossly normal. Tricuspid valve regurgitation is trivial. Aortic Valve: The aortic valve is tricuspid. Aortic valve regurgitation is not visualized. A vegetation is seen on the noncoronary. There is a small aortic valve  mass seen on the noncoronary cusp. The AoV mass measures 5 mm x 5 mm. Pulmonic Valve: The pulmonic valve was grossly normal. Pulmonic valve regurgitation is trivial. Aorta: The aortic root and ascending aorta are structurally normal, with no evidence of dilitation. Venous: IVC assessment for right atrial pressure unable to be performed due to mechanical ventilation. IAS/Shunts: No atrial level shunt detected by color flow Doppler.  LEFT VENTRICLE PLAX 2D LVIDd:         4.60 cm  Diastology LVIDs:         2.50 cm  LV e' medial:    6.31 cm/s LV PW:         1.00 cm  LV E/e' medial:  8.7 LV IVS:        1.00 cm  LV e' lateral:   7.62 cm/s LVOT diam:     2.10 cm  LV E/e' lateral: 7.2 LV SV:         48 LV SV Index:   20 LVOT Area:     3.46 cm  RIGHT VENTRICLE RV S prime:     12.70 cm/s TAPSE (M-mode): 2.3 cm LEFT ATRIUM             Index       RIGHT ATRIUM  Index LA diam:        3.90 cm 1.58 cm/m  RA Area:     16.50 cm LA Vol (A2C):   38.5 ml 15.59 ml/m RA Volume:   42.10 ml  17.05 ml/m LA Vol (A4C):   24.9 ml 10.08 ml/m LA Biplane Vol: 31.7 ml 12.83 ml/m  AORTIC VALVE LVOT Vmax:   81.50 cm/s LVOT Vmean:  47.800 cm/s LVOT VTI:    0.140 m  AORTA Ao Root diam: 3.70 cm MITRAL VALVE               TRICUSPID VALVE MV Area (PHT): 3.17 cm    TR Peak grad:   13.5 mmHg MV Decel Time: 239 msec    TR Vmax:        184.00 cm/s MV E velocity: 54.80 cm/s MV A velocity: 84.70 cm/s  SHUNTS MV E/A ratio:  0.65        Systemic VTI:  0.14 m                            Systemic Diam: 2.10 cm Lyman Bishop MD Electronically signed by Lyman Bishop MD Signature Date/Time: 10/31/2020/3:59:55 PM    Final        [facility] [phone] pager 11/01/2020, 4:53 PM

## 2020-11-01 NOTE — Progress Notes (Signed)
[facility] Progress Note [name] DOB: 09/23/1961 MRN: [id number]   Date of Service  11/01/2020  HPI/Events of Note  ABG 7.70/25/80/32/BE +11. Currently on VC 630x26.   eICU Interventions  D/c NaHCO3 drip. Change to VC 580x14. Repeat ABG in 1 hour.     Intervention Category Major Interventions: Acid-Base disturbance - evaluation and management  Brendan [name] 11/01/2020, 6:07 AM

## 2020-11-01 NOTE — Progress Notes (Signed)
NAME:  Wirt Hemmerich, MRN:  [id number], DOB:  25-Apr-1961, LOS: 2 ADMISSION DATE:  10/29/2020, CONSULTATION DATE:  10/30/2020 REFERRING MD:  Ripley Fraise, MD, CHIEF COMPLAINT:  Nausea, vomiting, blood in nephrostomy tube, found to have renal hematoma   Brief History   Mr. 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 He presented to OSH ED with nausea, vomiting, fever and blood in his nephrostomy tube that began the day of admission. Imaging in the ED found a renal hematoma.  History of present illness   Mr. 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 He presented to OSH ED with nausea, vomiting, fever and blood in his nephrostomy tube that began the day of admission.   In the ED, code sepsis was called due to concern for infection, fluids and cefepime were given. CT abdomen pelvis revealed shallow location of left percutaneous nephrostomy tube suggesting that it has withdrawn from the renal pelvis and a prominent left renal hematoma. He was tachycardic to the 160s and febrile to 104. CXR was unrevealing.   Per ED documentation, urology recommended admission to intensive care and reversal of anticoagulation. 1 dose of protamine 50mg  was administered to reverse the nursing home reported anticoagulation (per ED documentation, his last dose of heparin subcutaneous 5000 units was at 2200 on November 26th).   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 IVC filter not able to be placed due to extensive DVT -Subdural hematoma, s/p craniotomy 01/26/2020  Significant Hospital Events   Presented 10/30/20 to OSH ED, found to have renal hematoma and concern for sepsis  Consults:  Urology Nephrology Interventional Radiology Infectious Disease  Procedures:  NA  Significant Diagnostic Tests:   10/30/20 CT Abdomen Pelvis WO contrast: 1. Left percutaneous nephrostomy tube present, shallow location within the kidney suggesting that it has withdrawn from the renal pelvis 2. Prominent left renal hematoma with extensive stranding and hematoma in the pararenal spaces and apparently extending into the mesentery 3. Periaortic retroperitoneal stranding and soft tissue prominence suggesting retroperitoneal fibrosis, confluent lymphadenopathy, or aortitis. Appearances are similar to the prior study  10/31/20 CT Head 1. No substantial change in left left frontal craniotomy with subjacent 1.4 cm thick underlying subdural hematoma. Similar so shaded mass effect. 2. Left frontal e white matter encephalomalacia. 3. Similar findings of chronic bilateral maxillary sinusitis, as detailed above.  10/31/20 CT Abdomen/Pelvis 1. Mild increase in size of left kidney subcapsular hematoma with signs of progressive hemoperitoneum. Hemoperitoneum is again noted and is also mildly increased in volume from previous study. 2. Collection of high attenuation material anterior to the mid right kidney likely represents extravasated contrast material. 3. Signs of retroperitoneal fibrosis with increased retroperitoneal soft tissue surrounding the abdominal aorta and encasing bilateral ureters. 4. Small bilateral pleural effusions with overlying subsegmental atelectasis. 5. 3.5 cm infrarenal abdominal aortic  aneurysm. Aortic aneurysm NOS (ICD10-I71.9). 6. Aortic atherosclerosis.  10/31/2020 ECHO 1. Left ventricular ejection fraction, by estimation, is  65 to 70%. The  left ventricle has normal function. The left ventricle has no regional  wall motion abnormalities. Left ventricular diastolic parameters are  consistent with Grade I diastolic  dysfunction (impaired relaxation).  2. Right ventricular systolic function is normal. The right ventricular  size is normal.  3. The mitral valve is abnormal. Trivial mitral valve regurgitation.  4. Possible aortic valve vegetation is visualized on the noncoronary cusp  vs sclerosis and motion artifact. There is no associated aortic  insufficiency. Further imaging with TEE is recommended.  5. Possible small aortic valve mass on the noncoronary cusp which is  echogenic.  6. The aortic valve is tricuspid. Aortic valve regurgitation is not  Visualized.  Micro Data:   10/30/20 Blood culture- E faecalis Urine culture- pending  Antimicrobials:  11/26 Cefepime 11/27 Ciprofloxacin 11/27 Meropenem 11/27 Vancomycin 11/28 Zosyn >>  Interim history/subjective:  Continues on CRRT. Bicarb drip stopped overnight.  ID stopped vancomycin/Meropenem and started Zosyn yesterday.  Remains on levophed, neosynephrine and stress dose steroids Remains on versed for concern of seizures, EEG today.  Objective   Blood pressure 91/60, pulse 82, temperature 97.6 F (36.4 C), temperature source Axillary, resp. rate 14, height 6\' 1"  (1.854 m), weight 124.3 kg, SpO2 100 %.    Vent Mode: PRVC FiO2 (%):  [30 %] 30 % Set Rate:  [14 bmp-26 bmp] 14 bmp Vt Set:  [580 mL-630 mL] 580 mL PEEP:  [5 cmH20] 5 cmH20 Plateau Pressure:  [20 IFO27-74 cmH20] 20 cmH20   Intake/Output Summary (Last 24 hours) at 11/01/2020 0717 Last data filed at 11/01/2020 0700 Gross per 24 hour  Intake 7218.38 ml  Output 7374 ml  Net -155.62 ml   Filed Weights   10/30/20 0653 10/31/20  0500 11/01/20 0405  Weight: 113.4 kg 125.5 kg 124.3 kg    Examination: Constitutional: ill appearing, sedated, intubated Eyes: pupils pin-point but reactive, sclera anicteric Ears, nose, mouth, and throat: ETT in place, minimal secretions Cardiovascular: RRR, ext warm Respiratory: Clear to auscultation bilaterally Gastrointestinal: soft, +BS Skin: No rashes, normal turgor Neurologic: sedated, no spontaneous movement Psychiatric: RASS -5  Resolved Hospital Problem list   NA  Assessment & Plan:  Mr. 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 He presented to OSH ED with nausea, vomiting, fever and blood in his nephrostomy tube that began the day of admission. Imaging in the ED found a renal hematoma.  Severe septic shock secondary to E faecalis bacteremia- repeat cultures pending.  Suspected source is seeding from markedly abnormal peri-renal architecture.  Also baseline immunocompromised with cellcept. - ID Following, antibiotics changed to ceftriaxone + ampicillin - Will check TEE based on concerns for aortic valvular vegetation based on TTE - Pressor titration to maintain MAPs >65 - Check echo r/o IE  Left perinephric hematoma from dislodged nephrostomy tube Underlying retroperitoneal fibrosis - IR following, unsuccessful attempt at replacing dislodge nephrostomy tube - Perinephric hematoma has increased slightly in size.  - Consult placed to Urology for further evaluation of perinephric hematoma and nephrostomy tube placement  - Hold cellcept - Monitor H/H  Thrombocytopenia- related to acute illness - AC on hold - monitor daily - Fibrinogen WNL  Severe acute renal failure with hyperkalemia- started on CRRT 11/27. - Nephrology following - Will consider starting to pull fluid off as he is 10L positive for the hospitalization   Acute  hypoxemic respiratory failure-  due to marked acidemia, inability to protect airway from metabolic disarray - Vent support with lung protective tidal volumes - VAP prevention bundle - Not ready for SBT at present  Acute metabolic encephalopathy and question of associated seizure 11/27-28 - EEG negative for seizure activity, consistent with diffuse encephalopathy, non-specific.  - Will discontinue versed  Best practice (evaluated daily)   Diet: TF Pain/Anxiety/Delirium protocol (if indicated): fentanyl VAP protocol (if indicated): ordered DVT prophylaxis: Hold in setting of renal hematoma GI prophylaxis: NA Glucose control: Will follow sugars Mobility: PT/OT when able last date of multidisciplinary goals of care discussion: 11/28 Family and staff present: phone, nurse + md Summary of discussion: continue aggressive care Follow up goals of care discussion due: 12/4 Code Status: Full Disposition: ICU status   Patient critically ill due to multiorgan failure Interventions to address this today vent/CRRT/pressors Risk of deterioration without these interventions is high  I personally spent 45 minutes providing critical care not including any separately billable procedures  Freda Jackson, MD [place] Office: [phone]   See Amion for Pager Details

## 2020-11-01 NOTE — Progress Notes (Signed)
Pt's pH noted to be 7.7  Adjusted pt's mVE to 8.0 to achieve paCO2 ~50.

## 2020-11-01 NOTE — Progress Notes (Signed)
EEG Completed; Results Pending  

## 2020-11-01 NOTE — Consult Note (Signed)
I have been asked to see the patient by Dr. Freda Jackson, for evaluation and management of retroperitoneal fibrosis and retroperitoneal hematoma.  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  Patient was noted to have bloody output from one of his PCN tubes and interventional radiology was contacted to change the tube.  Attempts at changing the tube 11/27 were not successful and patient subsequently developed retroperitoneal and subcapsular hematoma.  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  He was started on CRRT in the ICU.  Patient also has severe septic shock with Enterococcus bacteremia as well as concern for possible vegetation on valves.  Patient is intubated and sedated in MICU and unable to provide any history.  All history is obtained from chart review.   Review of systems: A 12 point comprehensive review of systems was obtained and is negative unless otherwise stated in the history of present illness.  Patient Active Problem List   Diagnosis Date Noted  . Septic shock ([place]) 10/30/2020  . AKI (acute kidney injury) ([place])   . Anorexia symptom   . Advanced care planning/counseling discussion   . Goals of care, counseling/discussion   . Palliative care by specialist   . Subdural hematoma, acute ([place]) 01/26/2020  . SDH (subdural hematoma) ([address]) 01/23/2020  . DVT (deep venous thrombosis) ([place]) 12/19/2019  . Right leg DVT ([place]) 12/18/2019  . Symptomatic anemia 03/22/2018  . History of DVT in adulthood 03/22/2018  . Gross hematuria   . Nephrostomy tube displaced ([place]) 07/19/2017  . Pyelonephritis 02/16/2017  . Retroperitoneal fibrosis  02/16/2017  . Coronary atherosclerosis of native coronary artery   . Essential hypertension   . Stroke ([place])   . Diabetes mellitus without complication ([place])   . Chronic kidney disease, stage IV (severe) ([place])   . GERD (gastroesophageal reflux disease)   . Glaucoma   . Hemiparesis affecting right side as late effect of cerebrovascular accident ([place])   . Enlarged prostate with urinary obstruction   . Constipation     No current facility-administered medications on file prior to encounter.   Current Outpatient Medications on File Prior to Encounter  Medication Sig Dispense Refill  . acetaminophen (TYLENOL) 325 MG tablet Take 650 mg by mouth every 8 (eight) hours as needed for moderate pain or headache.    . Amino Acids-Protein Hydrolys (FEEDING SUPPLEMENT, PRO-STAT SUGAR FREE 64,) LIQD Take 30 mLs by mouth 3 (three) times daily. 887 mL 0  . calcium carbonate (CALTRATE 600) 1500 (600 Ca) MG TABS tablet Take 1 tablet by mouth 2 (two) times daily.     . cetaphil (CETAPHIL) lotion Apply 1 application topically every 12 (twelve) hours as needed for dry skin. Apply to arms, legs, and feet    . Cholecalciferol 1000 units tablet Take 2,000 Units by mouth daily.     Marland Kitchen ezetimibe (ZETIA) 10 MG tablet Take 10 mg by mouth every evening.     . famotidine (PEPCID) 20 MG tablet Take 1 tablet (20 mg total) by mouth at bedtime. 30 tablet 0  . heparin 5000 UNIT/ML injection Inject 1 mL (5,000 Units total) into the skin every 8 (eight) hours. (Patient taking differently: Inject 5,000 Units into the skin every 8 (eight) hours. At 0600, 1400,  and 2200) 90 mL 0  . HYDROcodone-acetaminophen (NORCO/VICODIN) 5-325 MG tablet Take 1 tablet by mouth every 6 (six) hours as needed for pain.    . mycophenolate (CELLCEPT) 500 MG tablet Take 500 mg by mouth 2 (two) times daily.    . Omega-3 Fatty Acids (FISH OIL) 1000 MG CAPS Take 1,000 mg by mouth in the morning and at bedtime.    . polyethylene glycol (MIRALAX / GLYCOLAX)  17 g packet Take 17 g by mouth daily as needed for mild constipation. (Patient taking differently: Take 17 g by mouth daily as needed for mild constipation. Mix in 4-8oz of liquid) 14 each 0  . senna (SENOKOT) 8.6 MG TABS tablet Take 8.6 mg by mouth daily.     . simvastatin (ZOCOR) 80 MG tablet Take 80 mg by mouth at bedtime.    . sodium bicarbonate 650 MG tablet Take 650 mg by mouth 3 (three) times daily.    . tamsulosin (FLOMAX) 0.4 MG CAPS capsule Take 0.4 mg by mouth at bedtime.    Marland Kitchen ibuprofen (ADVIL) 800 MG tablet Take 1 tablet (800 mg total) by mouth every 8 (eight) hours as needed. (Patient not taking: Reported on 10/30/2020) 30 tablet 0    Past Medical History:  Diagnosis Date  . Acidosis   . Acute unilateral obstructive uropathy   . Allergic rhinitis   . Chronic kidney disease, stage IV (severe) ([place])   . Constipation   . Coronary atherosclerosis of native coronary artery   . Diabetes mellitus without complication ([place])   . DVT (deep venous thrombosis) ([place])   . Enlarged prostate with urinary obstruction   . Essential hypertension, malignant   . GERD (gastroesophageal reflux disease)   . Glaucoma   . Hemiparesis affecting right side as late effect of cerebrovascular accident ([place])   . Hemiplegia ([place])   . Hydronephrosis   . Hyperlipidemia   . Hypertension   . Hypokalemia   . Muscle weakness (generalized)   . Open wound of scalp without complication   . PVD (peripheral vascular disease) ([place])   . Retroperitoneal fibrosis   . Stricture or kinking of ureter   . Stroke [facility])    right sided hemiparesis  . Vitamin D deficiency     Past Surgical History:  Procedure Laterality Date  . CRANIOTOMY Left 01/26/2020   Procedure: CRANIOTOMY HEMATOMA EVACUATION SUBDURAL;  Surgeon: Ashok Pall, MD;  Location: [place];  Service: Neurosurgery;  Laterality: Left;  . INTRAVASCULAR ULTRASOUND/IVUS N/A 01/13/2020   Procedure: Intravascular Ultrasound/IVUS;  Surgeon: Serafina Mitchell, MD;   Location: [place] [facility];  Service: Cardiovascular;  Laterality: N/A;  IJ, SVC, IVC  . IR GENERIC HISTORICAL  11/09/2016   IR NEPHROSTOMY EXCHANGE LEFT 11/09/2016 Corrie Mckusick, DO [facility]-INTERV RAD  . IR GENERIC HISTORICAL  01/09/2017   IR NEPHROSTOMY EXCHANGE LEFT 01/09/2017 [name], MD [facility]-INTERV RAD  . IR NEPHROSTOGRAM LEFT THRU EXISTING ACCESS  11/12/2019  . IR NEPHROSTOMY EXCHANGE LEFT  03/06/2017  . IR NEPHROSTOMY EXCHANGE LEFT  04/13/2017  . IR NEPHROSTOMY EXCHANGE LEFT  06/08/2017  . IR NEPHROSTOMY EXCHANGE LEFT  07/20/2017  . IR NEPHROSTOMY EXCHANGE LEFT  08/03/2017  . IR NEPHROSTOMY EXCHANGE LEFT  09/28/2017  . IR NEPHROSTOMY EXCHANGE LEFT  11/29/2017  . IR NEPHROSTOMY EXCHANGE LEFT  01/31/2018  . IR NEPHROSTOMY EXCHANGE LEFT  04/04/2018  . IR NEPHROSTOMY EXCHANGE LEFT  06/27/2018  . IR NEPHROSTOMY EXCHANGE LEFT  09/19/2018  . IR NEPHROSTOMY EXCHANGE LEFT  12/10/2018  . IR NEPHROSTOMY EXCHANGE LEFT  06/26/2019  . IR NEPHROSTOMY EXCHANGE LEFT  10/16/2019  . IR NEPHROSTOMY EXCHANGE LEFT  01/26/2020  . IR NEPHROSTOMY EXCHANGE LEFT  05/27/2020  . IR NEPHROSTOMY EXCHANGE LEFT  08/19/2020  . IR NEPHROSTOMY PLACEMENT LEFT  10/30/2020  . IVC VENOGRAPHY N/A 01/13/2020   Procedure: IVC Venography;  Surgeon: Serafina Mitchell, MD;  Location: [place] [facility];  Service: Cardiovascular;  Laterality: N/A;  . LOWER EXTREMITY VENOGRAPHY Left 01/13/2020   Procedure: LOWER EXTREMITY VENOGRAPHY;  Surgeon: Serafina Mitchell, MD;  Location: [place] [facility];  Service: Cardiovascular;  Laterality: Left;  . NEPHROSTOMY TUBE PLACEMENT ([place] [facility])    . WOUND EXPLORATION N/A 04/21/2020   Procedure: Wound revision, Scalp;  Surgeon: Ashok Pall, MD;  Location: [place];  Service: Neurosurgery;  Laterality: N/A;    Social History   Tobacco Use  . Smoking status: Never Smoker  . Smokeless tobacco: Never Used  Vaping Use  . Vaping Use: Never used  Substance Use Topics  . Alcohol use: Not Currently  . Drug use: Not  Currently    Family History  Family history unknown: Yes    PE: Vitals:   11/01/20 1400 11/01/20 1415 11/01/20 1430 11/01/20 1441  BP: (!) 89/59     Pulse: 86 87 87   Resp: 14 14 14    Temp:      TempSrc:      SpO2: 99% 100% 99% 99%  Weight:      Height:       Patient is intubated and sedated in ICU No cervical or supraclavicular lymphadenopathy appreciated Patient intubated Regular sinus rhythm/rate Abdomen is soft, nondistended Unable to visualize any nephrostomy tubes; Foley catheter with minimal yellow urine No identifiable skin lesions  Recent Labs    10/30/20 1955 10/31/20 0039 10/31/20 0452 10/31/20 0722 11/01/20 0445 11/01/20 0554 11/01/20 0605 11/01/20 0733 11/01/20 1152  WBC 29.8*  --  21.9*  --  18.5*  --   --   --   --   HGB 8.4*   < > 7.5*   < > 7.4*   < > 7.8* 7.5* 7.4*  HCT 27.0*   < > 24.1*   < > 23.0*   < > 23.0* 22.0* 23.5*   < > = values in this interval not displayed.   Recent Labs    10/31/20 0452 10/31/20 0722 10/31/20 1541 10/31/20 1541 11/01/20 0445 11/01/20 0445 11/01/20 0554 11/01/20 0605 11/01/20 0733  NA 136   < > 136   < > 136   < > 138 139 139  K 5.9*   < > 3.8   < > 3.0*   < > 3.0* 2.8* 2.8*  CL 100  --  98  --  95*  --   --   --   --   CO2 18*  --  23  --  26  --   --   --   --   GLUCOSE 260*  --  156*  --  183*  --   --   --   --   BUN 31*  --  21  --  19  --   --   --   --   CREATININE 4.36*  --  2.98*  --  2.11*  --   --   --   --   CALCIUM 7.4*  --  7.6*  --  7.7*  --   --   --   --    < > =  values in this interval not displayed.   Recent Labs    10/30/20 0009 10/30/20 1038 10/31/20 0745  INR 1.1 1.6* 1.9*   No results for input(s): LABURIN in the last 72 hours. Results for orders placed or performed during the hospital encounter of 10/29/20  Culture, blood (Routine x 2)     Status: None (Preliminary result)   Collection Time: 10/30/20 12:09 AM   Specimen: BLOOD LEFT ARM  Result Value Ref Range Status    Specimen Description   Final    BLOOD LEFT ARM Performed at [facility], [address], [place], [place] [address]    Special Requests   Final    BOTTLES DRAWN AEROBIC AND ANAEROBIC Blood Culture adequate volume Performed at [facility], [address] [place], [place] [address]    Culture  Setup Time   Final    GRAM POSITIVE COCCI IN BOTH AEROBIC AND ANAEROBIC BOTTLES Gram Stain Report Called to,Read Back By and Verified With: SMITH MD @ 1619 ON [phone] BY HENDERSON L. Organism ID to follow CRITICAL RESULT CALLED TO, READ BACK BY AND VERIFIED WITH: PHRMD L SEAY @0153  10/31/20 BY S GEZAHEGN Performed at [facility], [place] [address] [place], [place] [address]    Culture GRAM POSITIVE COCCI  Final   Report Status PENDING  Incomplete  Blood Culture ID Panel (Reflexed)     Status: Abnormal   Collection Time: 10/30/20 12:09 AM  Result Value Ref Range Status   Enterococcus faecalis DETECTED (A) NOT DETECTED Final    Comment: CRITICAL RESULT CALLED TO, READ BACK BY AND VERIFIED WITH: PHRMD L SEAY @0153  10/31/20 BY S GEZAHEGN    Enterococcus Faecium NOT DETECTED NOT DETECTED Final   Listeria monocytogenes NOT DETECTED NOT DETECTED Final   Staphylococcus species NOT DETECTED NOT DETECTED Final   Staphylococcus aureus (BCID) NOT DETECTED NOT DETECTED Final   Staphylococcus epidermidis NOT DETECTED NOT DETECTED Final   Staphylococcus lugdunensis NOT DETECTED NOT DETECTED Final   Streptococcus species NOT DETECTED NOT DETECTED Final   Streptococcus agalactiae NOT DETECTED NOT DETECTED Final   Streptococcus pneumoniae NOT DETECTED NOT DETECTED Final   Streptococcus pyogenes NOT DETECTED NOT DETECTED Final   A.calcoaceticus-baumannii NOT DETECTED NOT DETECTED Final   Bacteroides fragilis NOT DETECTED NOT DETECTED Final   Enterobacterales NOT DETECTED NOT DETECTED Final   Enterobacter cloacae complex NOT DETECTED NOT DETECTED Final   Escherichia coli NOT DETECTED NOT DETECTED Final    Klebsiella aerogenes NOT DETECTED NOT DETECTED Final   Klebsiella oxytoca NOT DETECTED NOT DETECTED Final   Klebsiella pneumoniae NOT DETECTED NOT DETECTED Final   Proteus species NOT DETECTED NOT DETECTED Final   Salmonella species NOT DETECTED NOT DETECTED Final   Serratia marcescens NOT DETECTED NOT DETECTED Final   Haemophilus influenzae NOT DETECTED NOT DETECTED Final   Neisseria meningitidis NOT DETECTED NOT DETECTED Final   Pseudomonas aeruginosa NOT DETECTED NOT DETECTED Final   Stenotrophomonas maltophilia NOT DETECTED NOT DETECTED Final   Candida albicans NOT DETECTED NOT DETECTED Final   Candida auris NOT DETECTED NOT DETECTED Final   Candida glabrata NOT DETECTED NOT DETECTED Final   Candida krusei NOT DETECTED NOT DETECTED Final   Candida parapsilosis NOT DETECTED NOT DETECTED Final   Candida tropicalis NOT DETECTED NOT DETECTED Final   Cryptococcus neoformans/gattii NOT DETECTED NOT DETECTED Final   Vancomycin resistance NOT DETECTED NOT DETECTED Final    Comment: Performed at [facility], [address], [place], [place]  [address]  Culture, blood (Routine x 2)     Status: Abnormal (Preliminary result)   Collection Time: 10/30/20  1:04 AM   Specimen: BLOOD LEFT ARM  Result Value Ref Range Status   Specimen Description   Final    BLOOD LEFT ARM Performed at [facility], [address], [place], [place] [address]    Special Requests   Final    BOTTLES DRAWN AEROBIC AND ANAEROBIC Blood Culture adequate volume Performed at [facility], [address] [place], [place] [address]    Culture  Setup Time   Final    GRAM POSITIVE COCCI Gram Stain Report Called to,Read Back By and Verified With: DIAZ,A ON 10/30/20 AT 2210 BY LOY,C AEROBIC BOTTLE ONLY Performed at [facility] Performed at [facility], [place] [address] [place], [place] [address]    Culture ENTEROCOCCUS FAECALIS (A)  Final   Report Status PENDING  Incomplete  Resp Panel by RT-PCR (Flu  A&B, Covid) Nasopharyngeal Swab     Status: None   Collection Time: 10/30/20  1:14 AM   Specimen: Nasopharyngeal Swab; Nasopharyngeal(NP) swabs in vial transport medium  Result Value Ref Range Status   SARS Coronavirus 2 by RT PCR NEGATIVE NEGATIVE Final    Comment: (NOTE) SARS-CoV-2 target nucleic acids are NOT DETECTED.  The SARS-CoV-2 RNA is generally detectable in upper respiratory specimens during the acute phase of infection. The lowest concentration of SARS-CoV-2 viral copies this assay can detect is 138 copies/mL. A negative result does not preclude SARS-Cov-2 infection and should not be used as the sole basis for treatment or other patient management decisions. A negative result may occur with  improper specimen collection/handling, submission of specimen other than nasopharyngeal swab, presence of viral mutation(s) within the areas targeted by this assay, and inadequate number of viral copies(<138 copies/mL). A negative result must be combined with clinical observations, patient history, and epidemiological information. The expected result is Negative.  Fact Sheet for Patients:  EntrepreneurPulse.com.au  Fact Sheet for Healthcare Providers:  IncredibleEmployment.be  This test is no t yet approved or cleared by the [place] FDA and  has been authorized for detection and/or diagnosis of SARS-CoV-2 by FDA under an Emergency Use Authorization (EUA). This EUA will remain  in effect (meaning this test can be used) for the duration of the COVID-19 declaration under Section 564(b)(1) of the Act, 21 U.S.C.section 360bbb-3(b)(1), unless the authorization is terminated  or revoked sooner.       Influenza A by PCR NEGATIVE NEGATIVE Final   Influenza B by PCR NEGATIVE NEGATIVE Final    Comment: (NOTE) The Xpert Xpress SARS-CoV-2/FLU/RSV plus assay is intended as an aid in the diagnosis of influenza from Nasopharyngeal swab specimens  and should not be used as a sole basis for treatment. Nasal washings and aspirates are unacceptable for Xpert Xpress SARS-CoV-2/FLU/RSV testing.  Fact Sheet for Patients: EntrepreneurPulse.com.au  Fact Sheet for Healthcare Providers: IncredibleEmployment.be  This test is not yet approved or cleared by the [place] FDA and has been authorized for detection and/or diagnosis of SARS-CoV-2 by FDA under an Emergency Use Authorization (EUA). This EUA will remain in effect (meaning this test can be used) for the duration of the COVID-19 declaration under Section 564(b)(1) of the Act, 21 U.S.C. section 360bbb-3(b)(1), unless the authorization is terminated or revoked.  Performed at [facility], [address], [place], [place] [address]   MRSA PCR Screening     Status: None   Collection Time: 10/30/20  9:30 AM   Specimen: Nasal Mucosa; Nasopharyngeal  Result Value Ref Range Status   MRSA by PCR NEGATIVE NEGATIVE Final    Comment:        The GeneXpert MRSA Assay (FDA approved for NASAL specimens only), is one component of a comprehensive MRSA colonization surveillance program. It is not intended to diagnose MRSA infection nor to guide or monitor treatment for MRSA infections. Performed at [facility], [place] [address], [place], [place] [address]   Culture, blood (x 2)     Status: None (Preliminary result)   Collection Time: 10/30/20 10:35 AM   Specimen: BLOOD RIGHT HAND  Result Value Ref Range Status   Specimen Description BLOOD RIGHT HAND  Final   Special Requests   Final    BOTTLES DRAWN AEROBIC ONLY Blood Culture results may not be optimal due to an inadequate volume of blood received in culture bottles   Culture   Final    NO GROWTH 2 DAYS Performed at [facility], [place] [address], [place], [place] [address]    Report Status PENDING  Incomplete  Culture, blood (x 2)     Status: None (Preliminary result)   Collection  Time: 10/30/20 10:50 AM   Specimen: BLOOD RIGHT HAND  Result Value Ref Range Status   Specimen Description BLOOD RIGHT HAND  Final   Special Requests   Final    BOTTLES DRAWN AEROBIC ONLY Blood Culture results may not be optimal due to an inadequate volume of blood received in culture bottles   Culture   Final    NO GROWTH 2 DAYS Performed at [facility], [place] [address] [place], [place] [address]    Report Status PENDING  Incomplete  Fungus culture, blood     Status: None (Preliminary result)   Collection Time: 10/30/20 10:50 AM   Specimen: BLOOD RIGHT HAND  Result Value Ref Range Status   Specimen Description BLOOD RIGHT HAND  Final   Special Requests   Final    BOTTLES DRAWN AEROBIC ONLY Blood Culture results may not be optimal due to an inadequate volume of blood received in culture bottles   Culture   Final    NO GROWTH 2 DAYS Performed at [facility], [place] [address], [place],  [address]    Report Status PENDING  Incomplete    Imaging: CT Abd/Pelvis 10/31/20 IMPRESSION: 1. Mild increase in size of left kidney subcapsular hematoma with signs of progressive hemoperitoneum. Hemoperitoneum is again noted and is also mildly increased in volume from previous study. 2. Collection of high attenuation material anterior to the mid right kidney likely represents extravasated contrast material. 3. Signs of retroperitoneal fibrosis with increased retroperitoneal soft tissue surrounding the abdominal aorta and encasing bilateral ureters. 4. Small bilateral pleural effusions with overlying subsegmental atelectasis. 5. 3.5 cm infrarenal abdominal aortic aneurysm. Aortic aneurysm NOS (ICD10-I71.9). 6. Aortic atherosclerosis.  Imp: 59 year old man with a history of retroperitoneal fibrosis managed with bilateral nephrostomy tubes currently admitted to medical ICU with multiple medical problems.  Urology was consulted for left retroperitoneal hematoma as well as  retroperitoneal fibrosis.  After reviewing imaging, right kidney appears atrophic and unlikely to have had nephrostomy tube.  Left kidney has subcapsular hematoma as well as retroperitoneal hematoma and there is extensive retroperitoneal fibrosis.  Recommendations: 1. Retroperitoneal fibrosis: Given degree of retroperitoneal fibrosis and longstanding nature of nephrostomy tubes recommend continued LEFT  PCN placement.  This may be easier in a few  days or longer when there is possible hydronephrosis however given subcapsular hematoma may need to wait longer.  Stent placement likely unsuccessful given severity of retroperitoneal fibrosis.  Right kidney appears atrophic.   2. Left retroperitoneal hematoma: Recommend serial H&H's and bedrest.  Supportive care and transfuse as needed.  If does not respond to supportive care or rapidly expanding consider CTA for possible IR embolization.  Thank you for involving me in this patient's care, I will continue to follow along.Ora Mcnatt D Devani Odonnel

## 2020-11-01 NOTE — Procedures (Signed)
[name]  MRN: [id number]  Epilepsy Attending: Lora Havens  Referring Physician/Provider: Dr. Ina Homes Date: 11/01/2020 Duration: 23.15 minutes  Patient history: 58 year old male with altered mental status and questionable seizure activity.  EEG to evaluate for seizures.  Level of alertness: Comatose  AEDs during EEG study: None  Technical aspects: This EEG study was done with scalp electrodes positioned according to the 10-20 International system of electrode placement. Electrical activity was acquired at a sampling rate of 500Hz  and reviewed with a high frequency filter of 70Hz  and a low frequency filter of 1Hz . EEG data were recorded continuously and digitally stored.   Description: EEG showed continuous generalized 6 to 7 Hz theta slowing as well as intermittent generalized 2 to 3 Hz delta slowing.  Sharp transients were seen in vertex region.  Hyperventilation and photic stimulation were not performed.    ABNORMALITY -Continuous slow, generalized  IMPRESSION: This study is suggestive of severe diffuse encephalopathy, nonspecific etiology.  No seizures or definite epileptiform discharges were seen throughout the recording.  Brendan [name]

## 2020-11-01 NOTE — Progress Notes (Signed)
K+ 3.0 reported to Dr Jonnie Finner orders to change PrismaSol bags to 4 K

## 2020-11-01 NOTE — Progress Notes (Signed)
H&H 7.4/23.5 reported to Dr Erin Fulling

## 2020-11-02 DIAGNOSIS — N179 Acute kidney failure, unspecified: Secondary | ICD-10-CM | POA: Diagnosis not present

## 2020-11-02 DIAGNOSIS — R6521 Severe sepsis with septic shock: Secondary | ICD-10-CM | POA: Diagnosis not present

## 2020-11-02 DIAGNOSIS — S37012A Minor contusion of left kidney, initial encounter: Secondary | ICD-10-CM | POA: Diagnosis not present

## 2020-11-02 DIAGNOSIS — A419 Sepsis, unspecified organism: Secondary | ICD-10-CM | POA: Diagnosis not present

## 2020-11-02 DIAGNOSIS — R7881 Bacteremia: Secondary | ICD-10-CM | POA: Diagnosis not present

## 2020-11-02 LAB — COMPREHENSIVE METABOLIC PANEL
ALT: 40 U/L (ref 0–44)
AST: 50 U/L — ABNORMAL HIGH (ref 15–41)
Albumin: 2.6 g/dL — ABNORMAL LOW (ref 3.5–5.0)
Alkaline Phosphatase: 54 U/L (ref 38–126)
Anion gap: 14 (ref 5–15)
BUN: 24 mg/dL — ABNORMAL HIGH (ref 8–23)
CO2: 25 mmol/L (ref 22–32)
Calcium: 7.7 mg/dL — ABNORMAL LOW (ref 8.9–10.3)
Chloride: 98 mmol/L (ref 98–111)
Creatinine, Ser: 1.86 mg/dL — ABNORMAL HIGH (ref 0.61–1.24)
GFR, Estimated: 41 mL/min — ABNORMAL LOW (ref >60)
Glucose, Bld: 285 mg/dL — ABNORMAL HIGH (ref 70–99)
Potassium: 3.2 mmol/L — ABNORMAL LOW (ref 3.5–5.1)
Sodium: 137 mmol/L (ref 135–145)
Total Bilirubin: 0.7 mg/dL (ref 0.3–1.2)
Total Protein: 6.2 g/dL — ABNORMAL LOW (ref 6.5–8.1)

## 2020-11-02 LAB — CBC WITH DIFFERENTIAL/PLATELET
Abs Immature Granulocytes: 0.09 10*3/uL — ABNORMAL HIGH (ref 0.00–0.07)
Basophils Absolute: 0 10*3/uL (ref 0.0–0.1)
Basophils Relative: 0 %
Eosinophils Absolute: 0 10*3/uL (ref 0.0–0.5)
Eosinophils Relative: 0 %
HCT: 28.1 % — ABNORMAL LOW (ref 39.0–52.0)
Hemoglobin: 8.9 g/dL — ABNORMAL LOW (ref 13.0–17.0)
Immature Granulocytes: 1 %
Lymphocytes Relative: 4 %
Lymphs Abs: 0.7 10*3/uL (ref 0.7–4.0)
MCH: 27.1 pg (ref 26.0–34.0)
MCHC: 31.7 g/dL (ref 30.0–36.0)
MCV: 85.7 fL (ref 80.0–100.0)
Monocytes Absolute: 0.6 10*3/uL (ref 0.1–1.0)
Monocytes Relative: 4 %
Neutro Abs: 14.6 10*3/uL — ABNORMAL HIGH (ref 1.7–7.7)
Neutrophils Relative %: 91 %
Platelets: 67 10*3/uL — ABNORMAL LOW (ref 150–400)
RBC: 3.28 MIL/uL — ABNORMAL LOW (ref 4.22–5.81)
RDW: 16.7 % — ABNORMAL HIGH (ref 11.5–15.5)
WBC: 16 10*3/uL — ABNORMAL HIGH (ref 4.0–10.5)
nRBC: 0.1 % (ref 0.0–0.2)

## 2020-11-02 LAB — TYPE AND SCREEN
ABO/RH(D): A POS
Antibody Screen: NEGATIVE
Unit division: 0
Unit division: 0

## 2020-11-02 LAB — POCT I-STAT 7, (LYTES, BLD GAS, ICA,H+H)
Acid-Base Excess: 5 mmol/L — ABNORMAL HIGH (ref 0.0–2.0)
Bicarbonate: 28.7 mmol/L — ABNORMAL HIGH (ref 20.0–28.0)
Calcium, Ion: 1.02 mmol/L — ABNORMAL LOW (ref 1.15–1.40)
HCT: 25 % — ABNORMAL LOW (ref 39.0–52.0)
Hemoglobin: 8.5 g/dL — ABNORMAL LOW (ref 13.0–17.0)
O2 Saturation: 99 %
Potassium: 3.1 mmol/L — ABNORMAL LOW (ref 3.5–5.1)
Sodium: 139 mmol/L (ref 135–145)
TCO2: 30 mmol/L (ref 22–32)
pCO2 arterial: 37.2 mmHg (ref 32.0–48.0)
pH, Arterial: 7.494 — ABNORMAL HIGH (ref 7.350–7.450)
pO2, Arterial: 111 mmHg — ABNORMAL HIGH (ref 83.0–108.0)

## 2020-11-02 LAB — BPAM RBC
Blood Product Expiration Date: 202112282359
Blood Product Expiration Date: 202112292359
ISSUE DATE / TIME: 202111271753
ISSUE DATE / TIME: 202111291640
Unit Type and Rh: 6200
Unit Type and Rh: 6200

## 2020-11-02 LAB — URINE CULTURE

## 2020-11-02 LAB — HEMOGLOBIN AND HEMATOCRIT, BLOOD
HCT: 28.1 % — ABNORMAL LOW (ref 39.0–52.0)
Hemoglobin: 8.8 g/dL — ABNORMAL LOW (ref 13.0–17.0)

## 2020-11-02 LAB — CULTURE, BLOOD (ROUTINE X 2)
Special Requests: ADEQUATE
Special Requests: ADEQUATE

## 2020-11-02 LAB — GLUCOSE, CAPILLARY
Glucose-Capillary: 263 mg/dL — ABNORMAL HIGH (ref 70–99)
Glucose-Capillary: 253 mg/dL — ABNORMAL HIGH (ref 70–99)
Glucose-Capillary: 269 mg/dL — ABNORMAL HIGH (ref 70–99)
Glucose-Capillary: 216 mg/dL — ABNORMAL HIGH (ref 70–99)
Glucose-Capillary: 171 mg/dL — ABNORMAL HIGH (ref 70–99)
Glucose-Capillary: 170 mg/dL — ABNORMAL HIGH (ref 70–99)

## 2020-11-02 LAB — RENAL FUNCTION PANEL
Albumin: 2.6 g/dL — ABNORMAL LOW (ref 3.5–5.0)
Anion gap: 12 (ref 5–15)
BUN: 24 mg/dL — ABNORMAL HIGH (ref 8–23)
CO2: 23 mmol/L (ref 22–32)
Calcium: 7.6 mg/dL — ABNORMAL LOW (ref 8.9–10.3)
Chloride: 100 mmol/L (ref 98–111)
Creatinine, Ser: 1.68 mg/dL — ABNORMAL HIGH (ref 0.61–1.24)
GFR, Estimated: 46 mL/min — ABNORMAL LOW (ref >60)
Glucose, Bld: 204 mg/dL — ABNORMAL HIGH (ref 70–99)
Phosphorus: 2.8 mg/dL (ref 2.5–4.6)
Potassium: 3.4 mmol/L — ABNORMAL LOW (ref 3.5–5.1)
Sodium: 135 mmol/L (ref 135–145)

## 2020-11-02 LAB — MAGNESIUM: Magnesium: 2.6 mg/dL — ABNORMAL HIGH (ref 1.7–2.4)

## 2020-11-02 LAB — PHOSPHORUS: Phosphorus: 1.7 mg/dL — ABNORMAL LOW (ref 2.5–4.6)

## 2020-11-02 LAB — PROTIME-INR
INR: 1.2 (ref 0.8–1.2)
Prothrombin Time: 14.2 seconds (ref 11.4–15.2)

## 2020-11-02 MED ORDER — SODIUM CHLORIDE 0.9 % IV SOLN
2.0000 g | INTRAVENOUS | Status: DC
  Administered 2020-11-02 (×2): 2 g via INTRAVENOUS
  Filled 2020-11-02 (×7): qty 2000

## 2020-11-02 MED ORDER — INSULIN ASPART 100 UNIT/ML ~~LOC~~ SOLN
0.0000 [IU] | SUBCUTANEOUS | Status: DC
  Administered 2020-11-02: 5 [IU] via SUBCUTANEOUS
  Administered 2020-11-02 (×3): 2 [IU] via SUBCUTANEOUS
  Administered 2020-11-02: 3 [IU] via SUBCUTANEOUS
  Administered 2020-11-03 – 2020-11-04 (×2): 1 [IU] via SUBCUTANEOUS
  Administered 2020-11-05: 2 [IU] via SUBCUTANEOUS
  Administered 2020-11-05 (×2): 1 [IU] via SUBCUTANEOUS
  Administered 2020-11-05: 2 [IU] via SUBCUTANEOUS
  Administered 2020-11-06: 1 [IU] via SUBCUTANEOUS
  Administered 2020-11-06: 2 [IU] via SUBCUTANEOUS

## 2020-11-02 MED ORDER — HYDROCORTISONE NA SUCCINATE PF 100 MG IJ SOLR
50.0000 mg | Freq: Every day | INTRAMUSCULAR | Status: DC
  Administered 2020-11-03 – 2020-11-04 (×2): 50 mg via INTRAVENOUS
  Filled 2020-11-02 (×2): qty 2

## 2020-11-02 MED ORDER — POTASSIUM PHOSPHATES 15 MMOLE/5ML IV SOLN
30.0000 mmol | Freq: Once | INTRAVENOUS | Status: AC
  Administered 2020-11-02: 30 mmol via INTRAVENOUS
  Filled 2020-11-02: qty 10

## 2020-11-02 MED ORDER — SODIUM CHLORIDE 0.9 % IV SOLN
2.0000 g | Freq: Four times a day (QID) | INTRAVENOUS | Status: DC
  Administered 2020-11-02 – 2020-11-05 (×12): 2 g via INTRAVENOUS
  Filled 2020-11-02 (×3): qty 2000
  Filled 2020-11-02 (×2): qty 2
  Filled 2020-11-02 (×2): qty 2000
  Filled 2020-11-02 (×2): qty 2
  Filled 2020-11-02: qty 2000
  Filled 2020-11-02: qty 2
  Filled 2020-11-02 (×3): qty 2000

## 2020-11-02 MED ORDER — FENTANYL CITRATE (PF) 100 MCG/2ML IJ SOLN: 50.0000 ug | INTRAMUSCULAR | Status: DC | PRN

## 2020-11-02 MED FILL — Fentanyl Citrate Preservative Free (PF) Inj 100 MCG/2ML: INTRAMUSCULAR | Qty: 2 | Status: AC

## 2020-11-02 NOTE — Progress Notes (Signed)
Inpatient Diabetes Program Recommendations  AACE/ADA: New Consensus Statement on Inpatient Glycemic Control (2015)  Target Ranges:  Prepandial:   less than 140 mg/dL      Peak postprandial:   less than 180 mg/dL (1-2 hours)      Critically ill patients:  140 - 180 mg/dL   Results for Brendan [name], Brendan [name] (MRN [id number]) as of 11/02/2020 07:17  Ref. Range 10/31/2020 23:16 11/01/2020 03:09 11/01/2020 07:21 11/01/2020 12:09 11/01/2020 15:19 11/01/2020 19:01  Glucose-Capillary Latest Ref Range: 70 - 99 mg/dL 182 (H) 144 (H) 160 (H) 186 (H) 177 (H) 235 (H)   Results for Brendan [name], Brendan [name] (MRN [id number]) as of 11/02/2020 07:17  Ref. Range 11/01/2020 23:09 11/02/2020 03:05  Glucose-Capillary Latest Ref Range: 70 - 99 mg/dL 240 (H) 263 (H)    Admit: Sepsis due to E. Faecalis Bacteremia  History CKD stage IV secondary to obstructive uropathy from idiopathic retroperitoneal fibrosis (has nephrostomy tubes), CVA   No History of Diabetes  Current Orders: Levemir 10 units BID      Novolog 0-6 units Q4 hours    Intubated--Getting CRRT  Getting TF @50cc /hr (TF started yest at 2pm)  Solucortef reduced to 50 mg BID yest (was 50 mg Q6H)    MD- Note rise in CBGs since initiation of tube feedings.  May consider adding Novolog tube feed coverage:  Novolog 3 units Q4 hours to start  HOLD if tube feeds HELD for any reason    --Will follow patient during hospitalization--  Wyn Quaker RN, MSN, CDE Diabetes Coordinator Inpatient Glycemic Control Team Team Pager: [phone] (8a-5p)

## 2020-11-02 NOTE — Progress Notes (Signed)
    [facility] has been requested to perform a transesophageal echocardiogram on 11/03/20 for Bacteremia with Dr. Marlou Porch. I spoke over the phone with his sister, [name] as she is deemed his legal guardian in regards to procedure, including risks/benefit. After careful review of history and examination, the risks and benefits of transesophageal echocardiogram have been explained including risks of esophageal damage, perforation (1:10,000 risk), bleeding, pharyngeal hematoma as well as other potential complications associated with conscious sedation including aspiration, arrhythmia, respiratory failure and death. Alternatives to treatment were discussed, questions were answered. [name] was agreeable to TEE. Of note, platelet count is 67,000 today. This was reviewed with procedural MD for tomorrow and cleared to proceed. Has been hypokalemic, will need BMET in the morning.   Brendan Bellis, NP-C 11/02/2020 3:10 PM

## 2020-11-02 NOTE — Progress Notes (Signed)
Patient ID: London Tarnowski, male   DOB: Jul 17, 1961, 59 y.o.   MRN: [id number] S: No events overnight and able to UF 1 liter. O:BP 105/76   Pulse (!) 109   Temp 98.1 F (36.7 C) (Axillary)   Resp (!) 26   Ht 6\' 1"  (1.854 m)   Wt 126.3 kg   SpO2 100%   BMI 36.74 kg/m   Intake/Output Summary (Last 24 hours) at 11/02/2020 0911 Last data filed at 11/02/2020 0800 Gross per 24 hour  Intake 2972.29 ml  Output 3916 ml  Net -943.71 ml   Intake/Output: I/O last 3 completed shifts: In: 6542.9 [I.V.:3377.8; Blood:362.5; NG/GT:1950; IV Piggyback:852.6] Out: 6301 [Urine:625; SWFUX:3235; Stool:400]  Intake/Output this shift:  Total I/O In: 69.4 [I.V.:19.4; NG/GT:50] Out: 28 [Other:28] Weight change: 2 kg Gen: intubated and sedated CVS: tachy at 109 Resp: cta Abd: +BS, soft Ext: 1+ edema  Recent Labs  Lab 10/30/20 0009 10/30/20 0009 10/30/20 1038 10/30/20 1110 10/30/20 1955 10/31/20 0039 10/31/20 0452 10/31/20 0722 10/31/20 1541 10/31/20 1541 11/01/20 0445 11/01/20 0554 11/01/20 0605 11/01/20 0733 11/01/20 1522 11/02/20 0310 11/02/20 0312  NA 139   < > 140   < > 136   < > 136   < > 136   < > 136 138 139 139 139 137 139  K 4.3   < > 4.8   < > 6.5*   < > 5.9*   < > 3.8   < > 3.0* 3.0* 2.8* 2.8* 3.3* 3.2* 3.1*  CL 105   < > 106  --  104  --  100  --  98  --  95*  --   --   --  100 98  --   CO2 23   < > 16*  --  14*  --  18*  --  23  --  26  --   --   --  28 25  --   GLUCOSE 165*   < > 122*  --  205*  --  260*  --  156*  --  183*  --   --   --  191* 285*  --   BUN 31*   < > 31*  --  35*  --  31*  --  21  --  19  --   --   --  21 24*  --   CREATININE 3.65*   < > 4.57*  --  4.98*  --  4.36*  --  2.98*  --  2.11*  --   --   --  2.00* 1.86*  --   ALBUMIN 3.5  --  2.1*  --   --   --  2.9*  --  3.2*  --  2.9*  --   --   --  2.7* 2.6*  --   CALCIUM 8.9   < > 7.8*  --  7.6*  --  7.4*  --  7.6*  --  7.7*  --   --   --  7.6* 7.7*  --   PHOS  --   --   --   --   --   --  3.0  --  2.0*  --   2.7  --   --   --  2.7 1.7*  --   AST 28  --  55*  --   --   --  82*  --   --   --  76*  --   --   --   --  50*  --   ALT 28  --  32  --   --   --  44  --   --   --  46*  --   --   --   --  40  --    < > = values in this interval not displayed.   Liver Function Tests: Recent Labs  Lab 10/31/20 0452 10/31/20 1541 11/01/20 0445 11/01/20 1522 11/02/20 0310  AST 82*  --  76*  --  50*  ALT 44  --  46*  --  40  ALKPHOS 36*  --  46  --  54  BILITOT 1.4*  --  1.8*  --  0.7  PROT 5.5*  --  6.0*  --  6.2*  ALBUMIN 2.9*   < > 2.9* 2.7* 2.6*   < > = values in this interval not displayed.   Recent Labs  Lab 10/30/20 0009  LIPASE 26   No results for input(s): AMMONIA in the last 168 hours. CBC: Recent Labs  Lab 10/30/20 1038 10/30/20 1110 10/30/20 1955 10/31/20 0039 10/31/20 0452 10/31/20 0722 10/31/20 0745 10/31/20 1254 11/01/20 0445 11/01/20 0554 11/01/20 2050 11/02/20 0310 11/02/20 0312  WBC 21.1*   < > 29.8*   < > 21.9*  --   --   --  18.5*  --   --  16.0*  --   NEUTROABS  --   --   --   --  19.8*  --   --   --  16.7*  --   --  14.6*  --   HGB 7.3*   < > 8.4*   < > 7.5*   < >  --    < > 7.4*   < > 8.8* 8.9* 8.5*  HCT 24.5*   < > 27.0*   < > 24.1*   < >  --    < > 23.0*   < > 27.5* 28.1* 25.0*  MCV 87.8  --  84.9  --  84.9  --   --   --  81.9  --   --  85.7  --   PLT 158   < > 118*   < > 88*   < > 73*  --  74*  --   --  67*  --    < > = values in this interval not displayed.   Cardiac Enzymes: No results for input(s): CKTOTAL, CKMB, CKMBINDEX, TROPONINI in the last 168 hours. CBG: Recent Labs  Lab 11/01/20 1519 11/01/20 1901 11/01/20 2309 11/02/20 0305 11/02/20 0722  GLUCAP 177* 235* 240* 263* 253*    Iron Studies: No results for input(s): IRON, TIBC, TRANSFERRIN, FERRITIN in the last 72 hours. Studies/Results: EEG  Result Date: 11/01/2020 Lora Havens, MD     11/01/2020 12:54 PM Patient Name: Morio Widen MRN: [id number] Epilepsy Attending: Lora Havens  Referring Physician/Provider: Dr. Ina Homes Date: 11/01/2020 Duration: 23.15 minutes Patient history: 58 year old male with altered mental status and questionable seizure activity.  EEG to evaluate for seizures. Level of alertness: Comatose AEDs during EEG study: None Technical aspects: This EEG study was done with scalp electrodes positioned according to the 10-20 International system of electrode placement. Electrical activity was acquired at a sampling rate of 500Hz  and reviewed with a high frequency filter of 70Hz  and a low frequency filter of 1Hz . EEG data were recorded continuously and digitally stored. Description: EEG showed continuous generalized 6 to 7 Hz theta  slowing as well as intermittent generalized 2 to 3 Hz delta slowing.  Sharp transients were seen in vertex region.  Hyperventilation and photic stimulation were not performed.   ABNORMALITY -Continuous slow, generalized IMPRESSION: This study is suggestive of severe diffuse encephalopathy, nonspecific etiology.  No seizures or definite epileptiform discharges were seen throughout the recording. Priyanka Barbra Sarks   CT ABDOMEN PELVIS WO CONTRAST  Result Date: 10/31/2020 CLINICAL DATA:  Follow-up left perinephric hematoma. EXAM: CT ABDOMEN AND PELVIS WITHOUT CONTRAST TECHNIQUE: Multidetector CT imaging of the abdomen and pelvis was performed following the standard protocol without IV contrast. COMPARISON:  10/30/2020 FINDINGS: Lower chest: Small bilateral pleural effusions with overlying subsegmental atelectasis. Hepatobiliary: No focal liver abnormality identified. Gallbladder unremarkable. No signs of biliary ductal dilatation Pancreas: Unremarkable. No pancreatic ductal dilatation or surrounding inflammatory changes. Spleen: Normal in size without focal abnormality. Adrenals/Urinary Tract: Normal appearance of the adrenal glands. Asymmetric atrophy of the right kidney is again noted. Excreted contrast material is identified within the right  renal collecting system. Left kidney subcapsular hematoma is again noted. This measures 10.7 x 7.1 cm, image 60/6. Previously 10.4 x 6.6 cm. There is surrounding perinephric hemorrhage with signs of mildly progressive hemoperitoneum. Collection of high attenuation material anterior to the mid right kidney is identified measuring 4.6 x 2.7 cm, image 42/3 and likely represents extravasated contrast material from attempted percutaneous nephrostomy tube placement performed yesterday. Concentrated IV contrast material noted within the urinary bladder. Stomach/Bowel: There is an indwelling nasogastric tube with tip in the distal stomach. High contrast material noted within the stomach and proximal duodenum. No dilated loops of large or small bowel. Vascular/Lymphatic: Aortic atherosclerosis. 3.5 cm infrarenal abdominal aortic aneurysm is again noted. 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 Reproductive: Prostate is unremarkable. Other: High attenuation peritoneal fluid consistent with hemoperitoneum. This appears increased in volume when compared with 10/30/2020. Musculoskeletal: No acute or significant osseous findings. IMPRESSION: 1. Mild increase in size of left kidney subcapsular hematoma with signs of progressive hemoperitoneum. Hemoperitoneum is again noted and is also mildly increased in volume from previous study. 2. Collection of high attenuation material anterior to the mid right kidney likely represents extravasated contrast material. 3. Signs of retroperitoneal fibrosis with increased retroperitoneal soft tissue surrounding the abdominal aorta and encasing bilateral ureters. 4. Small bilateral pleural effusions with overlying subsegmental atelectasis. 5. 3.5 cm infrarenal abdominal aortic aneurysm. Aortic aneurysm NOS (ICD10-I71.9). 6. Aortic atherosclerosis. Aortic Atherosclerosis  (ICD10-I70.0). Critical Value/emergent results were called by telephone at the time of interpretation on 10/31/2020 at 10:17 am to provider [facility] , who verbally acknowledged these results. Electronically Signed   By: Kerby Moors M.D.   On: 10/31/2020 10:18   CT HEAD WO CONTRAST  Result Date: 10/31/2020 CLINICAL DATA:  Cerebral hemorrhage. EXAM: CT HEAD WITHOUT CONTRAST TECHNIQUE: Contiguous axial images were obtained from the base of the skull through the vertex without intravenous contrast. COMPARISON:  CT head November 27 21. FINDINGS: Brain: Postsurgical changes of left left-sided craniotomy. Similar size of an underlying subdural hemorrhage, which measures up to 1.4 cm in thickness. Single locule within this collection, unchanged. Similar associated mass effect without substantial midline shift. Similar appearance of encephalomalacia within the left frontal lobe white matter. No hydrocephalus. No evidence of new/acute large vascular territory infarct. Vascular: Calcific atherosclerosis. Skull: Left-sided craniotomy. Sinuses/Orbits: Similar complete opacification of bilateral maxillary sinuses with mineralization in the left maxillary sinus. Maxillary  sinus opacification is mildly expansile with surrounding bony thickening, suggesting chronicity. Mucosal thickening of ethmoid air cells and sphenoid sinuses. Other: No mastoid effusions. IMPRESSION: 1. No substantial change in left left frontal craniotomy with subjacent 1.4 cm thick underlying subdural hematoma. Similar so shaded mass effect. 2. Left frontal e white matter encephalomalacia. 3. Similar findings of chronic bilateral maxillary sinusitis, as detailed above. Electronically Signed   By: Margaretha Sheffield MD   On: 10/31/2020 09:51   ECHOCARDIOGRAM COMPLETE  Result Date: 10/31/2020    ECHOCARDIOGRAM REPORT   Patient Name:   LERON STOFFERS  Date of Exam: 10/31/2020 Medical Rec #:  [id number]  Height:       73.0 in Accession #:    [id number]  Weight:       276.7 lb Date of Birth:  07-23-1961 BSA:          2.470 m Patient Age:    68 years   BP:           116/73 mmHg Patient Gender: M          HR:           77 bpm. Exam Location:  Inpatient Procedure: 2D Echo, Cardiac Doppler and Color Doppler Indications:    Endocarditis I38  History:        Patient has no prior history of Echocardiogram examinations.                 Risk Factors:Hypertension and Dyslipidemia. Septic shock,AKI                 (acute kidney injury),Right leg DVT.  Sonographer:    Alvino Chapel RCS Referring Phys: [id number] Candee Furbish  Sonographer Comments: Patient on continuous dialysis and mechancial ventilator during echo. IMPRESSIONS  1. Left ventricular ejection fraction, by estimation, is 65 to 70%. The left ventricle has normal function. The left ventricle has no regional wall motion abnormalities. Left ventricular diastolic parameters are consistent with Grade I diastolic dysfunction (impaired relaxation).  2. Right ventricular systolic function is normal. The right ventricular size is normal.  3. The mitral valve is abnormal. Trivial mitral valve regurgitation.  4. Possible aortic valve vegetation is visualized on the noncoronary cusp vs sclerosis and motion artifact. There is no associated aortic insufficiency. Further imaging with TEE is recommended.  5. Possible small aortic valve mass on the noncoronary cusp which is echogenic.  6. The aortic valve is tricuspid. Aortic valve regurgitation is not visualized. Conclusion(s)/Recommendation(s): Possible valvular vegetation noted on the non-coronary cusp of the aortic valve on this transthoracic echocardiogram. Would recommend a transesophageal echocardiogram to exclude infective endocarditis if clinically indicated. FINDINGS  Left Ventricle: Left ventricular ejection fraction, by estimation, is 65 to 70%. The left ventricle has normal function. The left ventricle has no regional wall motion abnormalities. The left ventricular  internal cavity size was normal in size. There is  no left ventricular hypertrophy. Left ventricular diastolic parameters are consistent with Grade I diastolic dysfunction (impaired relaxation). Indeterminate filling pressures. Right Ventricle: The right ventricular size is normal. No increase in right ventricular wall thickness. Right ventricular systolic function is normal. Left Atrium: Left atrial size was normal in size. Right Atrium: Right atrial size was normal in size. Pericardium: There is no evidence of pericardial effusion. Mitral Valve: The mitral valve is abnormal. There is mild thickening of the mitral valve leaflet(s). Trivial mitral valve regurgitation. Tricuspid Valve: The tricuspid valve is grossly normal. Tricuspid valve regurgitation is trivial. Aortic Valve:  The aortic valve is tricuspid. Aortic valve regurgitation is not visualized. A vegetation is seen on the noncoronary. There is a small aortic valve mass seen on the noncoronary cusp. The AoV mass measures 5 mm x 5 mm. Pulmonic Valve: The pulmonic valve was grossly normal. Pulmonic valve regurgitation is trivial. Aorta: The aortic root and ascending aorta are structurally normal, with no evidence of dilitation. Venous: IVC assessment for right atrial pressure unable to be performed due to mechanical ventilation. IAS/Shunts: No atrial level shunt detected by color flow Doppler.  LEFT VENTRICLE PLAX 2D LVIDd:         4.60 cm  Diastology LVIDs:         2.50 cm  LV e' medial:    6.31 cm/s LV PW:         1.00 cm  LV E/e' medial:  8.7 LV IVS:        1.00 cm  LV e' lateral:   7.62 cm/s LVOT diam:     2.10 cm  LV E/e' lateral: 7.2 LV SV:         48 LV SV Index:   20 LVOT Area:     3.46 cm  RIGHT VENTRICLE RV S prime:     12.70 cm/s TAPSE (M-mode): 2.3 cm LEFT ATRIUM             Index       RIGHT ATRIUM           Index LA diam:        3.90 cm 1.58 cm/m  RA Area:     16.50 cm LA Vol (A2C):   38.5 ml 15.59 ml/m RA Volume:   42.10 ml  17.05 ml/m LA  Vol (A4C):   24.9 ml 10.08 ml/m LA Biplane Vol: 31.7 ml 12.83 ml/m  AORTIC VALVE LVOT Vmax:   81.50 cm/s LVOT Vmean:  47.800 cm/s LVOT VTI:    0.140 m  AORTA Ao Root diam: 3.70 cm MITRAL VALVE               TRICUSPID VALVE MV Area (PHT): 3.17 cm    TR Peak grad:   13.5 mmHg MV Decel Time: 239 msec    TR Vmax:        184.00 cm/s MV E velocity: 54.80 cm/s MV A velocity: 84.70 cm/s  SHUNTS MV E/A ratio:  0.65        Systemic VTI:  0.14 m                            Systemic Diam: 2.10 cm Lyman Bishop MD Electronically signed by Lyman Bishop MD Signature Date/Time: 10/31/2020/3:59:55 PM    Final    . B-complex with vitamin C  1 tablet Per Tube Daily  . docusate  100 mg Per Tube BID  . feeding supplement (PROSource TF)  90 mL Per Tube QID  . hydrocortisone sod succinate (SOLU-CORTEF) inj  50 mg Intravenous Q12H  . [START ON 11/03/2020] hydrocortisone sod succinate (SOLU-CORTEF) inj  50 mg Intravenous Daily  . insulin aspart  0-9 Units Subcutaneous Q4H  . insulin detemir  10 Units Subcutaneous BID  . mouth rinse  15 mL Mouth Rinse 10 times per day  . pantoprazole sodium  40 mg Per Tube QHS  . polyethylene glycol  17 g Per Tube Daily  . sodium chloride flush  10-40 mL Intracatheter Q12H    BMET    Component  Value Date/Time   NA 139 11/02/2020 0312   K 3.1 (L) 11/02/2020 0312   CL 98 11/02/2020 0310   CO2 25 11/02/2020 0310   GLUCOSE 285 (H) 11/02/2020 0310   BUN 24 (H) 11/02/2020 0310   CREATININE 1.86 (H) 11/02/2020 0310   CALCIUM 7.7 (L) 11/02/2020 0310   GFRNONAA 41 (L) 11/02/2020 0310   GFRAA 26 (L) 04/21/2020 1300   CBC    Component Value Date/Time   WBC 16.0 (H) 11/02/2020 0310   RBC 3.28 (L) 11/02/2020 0310   HGB 8.5 (L) 11/02/2020 0312   HCT 25.0 (L) 11/02/2020 0312   PLT 67 (L) 11/02/2020 0310   MCV 85.7 11/02/2020 0310   MCH 27.1 11/02/2020 0310   MCHC 31.7 11/02/2020 0310   RDW 16.7 (H) 11/02/2020 0310   LYMPHSABS 0.7 11/02/2020 0310   MONOABS 0.6 11/02/2020 0310    EOSABS 0.0 11/02/2020 0310   BASOSABS 0.0 11/02/2020 0310    Assessment/Plan:  1. 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   1. Started on CRRT due to severe oliguric AKI on 10/30/20. 2. Dialysate and replacement fluids changed to 4K due to hypokalemia 3. Pre-filter 500 ml/hr, post filter 300 ml/hr, dialysate 2,000 ml/hr 4. UF goal to keep even but able to UF 1 liter without change in pressor support. 2. Severe Septic shock from Enterococcus faecalis bacteremia- presumably urinary source.  Abx per ID and primary team. 3. 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  IR following.   1. Appreciate urology input and will retry placing left PCN in a few days or longer once hydro is present. 4. Left perinephric hematoma- continue to follow H/H and transfuse prn. 5. Thrombocytopenia- likely due to critical illness.  Anticoagulation on hold.  Follow platelets. 6. Hyperkalemia- improved with CRRT initiation on 10/30/20.  7. Hypophosphatemia- follow and replace as needed.   1. Ordered 30 mmol Kphos today 8. Acute hypoxemic respiratory failure- s/p intubation and currently on vent per PCCM. 9. AMS- EEG today.  Not responding despite decreased sedation.  Donetta Potts, MD [employer] [phone]

## 2020-11-02 NOTE — Progress Notes (Signed)
NAME:  Riot Waterworth, MRN:  [id number], DOB:  June 30, 1961, LOS: 3 ADMISSION DATE:  10/29/2020, CONSULTATION DATE:  10/30/2020 REFERRING MD:  Ripley Fraise, MD, CHIEF COMPLAINT:  Nausea, vomiting, blood in nephrostomy tube, found to have renal hematoma   Brief History   Mr. 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 He presented to OSH ED with nausea, vomiting, fever and blood in his nephrostomy tube that began the day of admission. Imaging in the ED found a renal hematoma.  History of present illness   Mr. 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 He presented to OSH ED with nausea, vomiting, fever and blood in his nephrostomy tube that began the day of admission.   In the ED, code sepsis was called due to concern for infection, fluids and cefepime were given. CT abdomen pelvis revealed shallow location of left percutaneous nephrostomy tube suggesting that it has withdrawn from the renal pelvis and a prominent left renal hematoma. He was tachycardic to the 160s and febrile to 104. CXR was unrevealing.   Per ED documentation, urology recommended admission to intensive care and reversal of anticoagulation. 1 dose of protamine 50mg  was administered to reverse the nursing home reported anticoagulation (per ED documentation, his last dose of heparin subcutaneous 5000 units was at 2200 on November 26th).   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 IVC filter not able to be placed due to extensive DVT -Subdural hematoma, s/p craniotomy 01/26/2020  Significant Hospital Events   Presented 10/30/20 to OSH ED, found to have renal hematoma and concern for sepsis  Consults:  Urology Nephrology Interventional Radiology Infectious Disease  Procedures:  NA  Significant Diagnostic Tests:   10/30/20 CT Abdomen Pelvis WO contrast: 1. Left percutaneous nephrostomy tube present, shallow location within the kidney suggesting that it has withdrawn from the renal pelvis 2. Prominent left renal hematoma with extensive stranding and hematoma in the pararenal spaces and apparently extending into the mesentery 3. Periaortic retroperitoneal stranding and soft tissue prominence suggesting retroperitoneal fibrosis, confluent lymphadenopathy, or aortitis. Appearances are similar to the prior study  10/31/20 CT Head 1. No substantial change in left left frontal craniotomy with subjacent 1.4 cm thick underlying subdural hematoma. Similar so shaded mass effect. 2. Left frontal e white matter encephalomalacia. 3. Similar findings of chronic bilateral maxillary sinusitis, as detailed above.  10/31/20 CT Abdomen/Pelvis 1. Mild increase in size of left kidney subcapsular hematoma with signs of progressive hemoperitoneum. Hemoperitoneum is again noted and is also mildly increased in volume from previous study. 2. Collection of high attenuation material anterior to the mid right kidney likely represents extravasated contrast material. 3. Signs of retroperitoneal fibrosis with increased retroperitoneal soft tissue surrounding the abdominal aorta and encasing bilateral ureters. 4. Small bilateral pleural effusions with overlying subsegmental atelectasis. 5. 3.5 cm infrarenal abdominal aortic  aneurysm. Aortic aneurysm NOS (ICD10-I71.9). 6. Aortic atherosclerosis.  10/31/2020 ECHO 1. Left ventricular ejection fraction, by estimation, is  65 to 70%. The  left ventricle has normal function. The left ventricle has no regional  wall motion abnormalities. Left ventricular diastolic parameters are  consistent with Grade I diastolic  dysfunction (impaired relaxation).  2. Right ventricular systolic function is normal. The right ventricular  size is normal.  3. The mitral valve is abnormal. Trivial mitral valve regurgitation.  4. Possible aortic valve vegetation is visualized on the noncoronary cusp  vs sclerosis and motion artifact. There is no associated aortic  insufficiency. Further imaging with TEE is recommended.  5. Possible small aortic valve mass on the noncoronary cusp which is  echogenic.  6. The aortic valve is tricuspid. Aortic valve regurgitation is not  Visualized.  Micro Data:   10/30/20 Blood culture- E faecalis Urine culture- No growth  Antimicrobials:  11/26 Cefepime 11/27 Ciprofloxacin 11/27 Meropenem 11/27 Vancomycin 11/28 Zosyn  11/29 Ceftriaxone + Ampicillin>>  Interim history/subjective:   ID started ceftriaxone + ampicillin yesterday for bacteremia Weaning vasopressor support Weaning sedation  No acute events overnight per nursing. CRRT is currently off due to clogged filter but will be restarting soon  Objective   Blood pressure 110/83, pulse 100, temperature 98.1 F (36.7 C), temperature source Axillary, resp. rate 14, height 6\' 1"  (1.854 m), weight 126.3 kg, SpO2 100 %.    Vent Mode: PSV;CPAP FiO2 (%):  [30 %] 30 % Set Rate:  [14 bmp] 14 bmp Vt Set:  [580 mL] 580 mL PEEP:  [5 cmH20] 5 cmH20 Pressure Support:  [10 cmH20] 10 cmH20 Plateau Pressure:  [13 cmH20-19 cmH20] 13 cmH20   Intake/Output Summary (Last 24 hours) at 11/02/2020 0744 Last data filed at 11/02/2020 0700 Gross per 24 hour  Intake 3078.06 ml  Output 4067 ml    Net -988.94 ml   Filed Weights   10/31/20 0500 11/01/20 0405 11/02/20 0500  Weight: 125.5 kg 124.3 kg 126.3 kg    Examination: Constitutional: ill appearing, sedated, intubated Eyes: pupils pin-point but reactive, sclera anicteric Ears, nose, mouth, and throat: ETT in place, minimal secretions Cardiovascular: RRR, ext warm Respiratory: Clear to auscultation bilaterally Gastrointestinal: soft, +BS Skin: No rashes, normal turgor Neurologic: sedated, no spontaneous movement  Resolved Hospital Problem list   NA  Assessment & Plan:  Mr. 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 He presented to OSH ED with nausea, vomiting, fever and blood in his nephrostomy tube that began the day of admission. Imaging in the ED found a renal hematoma.  Severe septic shock secondary to E faecalis bacteremia  Suspected source is seeding from markedly abnormal peri-renal architecture.  Also baseline immunocompromised with cellcept. - ID Following, antibiotics changed to ceftriaxone + ampicillin on 11/29 - Will check TEE based on concerns for aortic valvular vegetation based on TTE - Pressor titration to maintain MAPs >65  Left perinephric hematoma from dislodged nephrostomy tube Underlying retroperitoneal fibrosis - IR following, unsuccessful attempt at replacing dislodge nephrostomy tube - Perinephric hematoma has increased slightly in size.  - Urology consulted. Recommend continued monitoring of perinephric hematoma and patient will require left PCN placement at some point  - Hold cellcept - Monitor H/H  Thrombocytopenia- related to acute illness - AC on hold - monitor daily - Fibrinogen WNL  Severe acute renal failure with hyperkalemia- started on CRRT 11/27. - Nephrology following - Will consider starting to pull fluid off as he is 10L positive for  the  hospitalization   Acute hypoxemic respiratory failure- due to marked acidemia, inability to protect airway from metabolic disarray - Vent support with lung protective tidal volumes - VAP prevention bundle - SBT this morning, mental status is barrier to extubation  Acute metabolic encephalopathy and question of associated seizure 11/27-28 - EEG negative for seizure activity, consistent with diffuse encephalopathy, non-specific.  - Will discontinue versed  Hyperglycemia Blood sugars increasing over past 24 hours - 10 units levemir BID - Sensitive sliding scale  Best practice (evaluated daily)   Diet: TF Pain/Anxiety/Delirium protocol (if indicated): fentanyl VAP protocol (if indicated): ordered DVT prophylaxis: Hold in setting of renal hematoma, SCDs ordered GI prophylaxis: NA Glucose control: levemir, SSI Mobility: PT/OT when able last date of multidisciplinary goals of care discussion: 11/30 Family and staff present: phone, nurse + md Summary of discussion: continue aggressive care Follow up goals of care discussion due: 12/4 Code Status: Full Disposition: ICU status   Patient critically ill due to multiorgan failure Interventions to address this today vent/CRRT/pressors Risk of deterioration without these interventions is high  I personally spent 40 minutes providing critical care not including any separately billable procedures  Freda Jackson, MD [place] Office: [phone]   See Amion for Pager Details

## 2020-11-02 NOTE — Progress Notes (Signed)
Supervising Physician: Dr. Ruthann Cancer  Patient Status:  [facility] - In-pt  Chief Complaint: Septic shock  Subjective: Remains intubated, sedated Unsuccessful attempt at replacement of left PCN 11/27. Urology consulted, appreciate their input. CRRT appears to be helping Hgb stable at 8.9  Allergies: Chlorhexidine and Other  Medications:  Current Facility-Administered Medications:  .   prismasol BGK 4/2.5 infusion, , CRRT, Continuous, Roney Jaffe, MD, Last Rate: 500 mL/hr at 11/02/20 0502, New Bag at 11/02/20 0502 .   prismasol BGK 4/2.5 infusion, , CRRT, Continuous, Roney Jaffe, MD, Last Rate: 300 mL/hr at 11/02/20 0150, New Bag at 11/02/20 0150 .  0.9 %  sodium chloride infusion, 250 mL, Intravenous, Continuous, Kovacich, Amanda, MD, Last Rate: 10 mL/hr at 11/02/20 1000, Rate Verify at 11/02/20 1000 .  0.9 %  sodium chloride infusion, 250 mL, Intravenous, Continuous, Ripley Fraise, MD .  ampicillin (OMNIPEN) 2 g in sodium chloride 0.9 % 100 mL IVPB, 2 g, Intravenous, Q4H, Mignon Pine, DO, Stopped at 11/02/20 0914 .  B-complex with vitamin C tablet 1 tablet, 1 tablet, Per Tube, Daily, Freddi Starr, MD, 1 tablet at 11/02/20 0902 .  cefTRIAXone (ROCEPHIN) 2 g in sodium chloride 0.9 % 100 mL IVPB, 2 g, Intravenous, Q12H, Mignon Pine, DO, Stopped at 11/02/20 0945 .  docusate (COLACE) 50 MG/5ML liquid 100 mg, 100 mg, Per Tube, BID, Candee Furbish, MD, 100 mg at 11/01/20 2102 .  feeding supplement (PROSource TF) liquid 90 mL, 90 mL, Per Tube, QID, Freddi Starr, MD, 90 mL at 11/02/20 0903 .  feeding supplement (VITAL 1.5 CAL) liquid 1,000 mL, 1,000 mL, Per Tube, Continuous, Freddi Starr, MD, Last Rate: 50 mL/hr at 11/01/20 1354, 1,000 mL at 11/01/20 1354 .  fentaNYL (SUBLIMAZE) bolus via infusion 50 mcg, 50 mcg, Intravenous, Q15 min PRN, Candee Furbish, MD, 50 mcg at 11/01/20 1132 .  fentaNYL 2519mcg in NS 211mL (29mcg/ml) infusion-PREMIX, 50-200  mcg/hr, Intravenous, Continuous, Candee Furbish, MD, Stopped at 11/02/20 [phone] .  heparin injection 1,000-6,000 Units, 1,000-6,000 Units, CRRT, PRN, Reesa Chew, MD, 2,000 Units at 11/02/20 0751 .  hydrocortisone sodium succinate (SOLU-CORTEF) 100 MG injection 50 mg, 50 mg, Intravenous, Q12H, Freddi Starr, MD, 50 mg at 11/02/20 0316 .  [START ON 11/03/2020] hydrocortisone sodium succinate (SOLU-CORTEF) 100 MG injection 50 mg, 50 mg, Intravenous, Daily, Dewald, Jonathan B, MD .  insulin aspart (novoLOG) injection 0-9 Units, 0-9 Units, Subcutaneous, Q4H, Freddi Starr, MD, 2 Units at 11/02/20 [phone] .  insulin detemir (LEVEMIR) injection 10 Units, 10 Units, Subcutaneous, BID, Candee Furbish, MD, 10 Units at 11/02/20 [phone]  iohexol (OMNIPAQUE) 300 MG/ML solution 100 mL, 100 mL, Per Tube, Once PRN, Suttle, Dylan J, MD .  lidocaine (XYLOCAINE) 1 % (with pres) injection, , Infiltration, PRN, Suttle, Dylan J, MD, 5 mL at 10/30/20 1340 .  MEDLINE mouth rinse, 15 mL, Mouth Rinse, 10 times per day, Candee Furbish, MD, 15 mL at 11/02/20 0904 .  norepinephrine (LEVOPHED) 16 mg in 211mL premix infusion, 2-35 mcg/min, Intravenous, Titrated, Candee Furbish, MD, Last Rate: 2.81 mL/hr at 11/02/20 1000, 3 mcg/min at 11/02/20 1000 .  pantoprazole sodium (PROTONIX) 40 mg/20 mL oral suspension 40 mg, 40 mg, Per Tube, QHS, Freddi Starr, MD, 40 mg at 11/01/20 2102 .  polyethylene glycol (MIRALAX / GLYCOLAX) packet 17 g, 17 g, Per Tube, Daily, Candee Furbish, MD, 17 g at 11/01/20 0915 .  potassium PHOSPHATE 30 mmol in dextrose 5 % 500 mL infusion, 30 mmol, Intravenous, Once, Donato Heinz, MD, Last Rate: 85 mL/hr at 11/02/20 1000, Rate Verify at 11/02/20 1000 .  prismasol BGK 4/2.5 infusion, , CRRT, Continuous, Roney Jaffe, MD, Last Rate: 2,000 mL/hr at 11/02/20 0918, New Bag at 11/02/20 0918 .  propofol (DIPRIVAN) 1000 MG/100ML infusion, 5-80 mcg/kg/min, Intravenous, Titrated, Freddi Starr, MD, Stopped at 11/02/20 0505 .  sodium chloride 0.9 % primer fluid for CRRT, 500 mL, CRRT, PRN, Reesa Chew, MD .  sodium chloride flush (NS) 0.9 % injection 10-40 mL, 10-40 mL, Intracatheter, Q12H, Freddi Starr, MD, 10 mL at 11/02/20 0904 .  sodium chloride flush (NS) 0.9 % injection 10-40 mL, 10-40 mL, Intracatheter, PRN, Freddi Starr, MD    Vital Signs: BP 106/84   Pulse (!) 105   Temp 98.1 F (36.7 C) (Axillary)   Resp 17   Ht 6\' 1"  (1.854 m)   Wt 126.3 kg   SpO2 100%   BMI 36.74 kg/m   Physical Exam  Intubated/sedated.  Abdomen: rotated on left side, procedure site down. No obvious issues with site.  GU: blood-tinged urine in foley.   Imaging: EEG  Result Date: 11/01/2020 Lora Havens, MD     11/01/2020 12:54 PM Patient Name: Narayan Scull MRN: [id number] Epilepsy Attending: Lora Havens Referring Physician/Provider: Dr. Ina Homes Date: 11/01/2020 Duration: 23.15 minutes Patient history: 59 year old male with altered mental status and questionable seizure activity.  EEG to evaluate for seizures. Level of alertness: Comatose AEDs during EEG study: None Technical aspects: This EEG study was done with scalp electrodes positioned according to the 10-20 International system of electrode placement. Electrical activity was acquired at a sampling rate of 500Hz  and reviewed with a high frequency filter of 70Hz  and a low frequency filter of 1Hz . EEG data were recorded continuously and digitally stored. Description: EEG showed continuous generalized 6 to 7 Hz theta slowing as well as intermittent generalized 2 to 3 Hz delta slowing.  Sharp transients were seen in vertex region.  Hyperventilation and photic stimulation were not performed.   ABNORMALITY -Continuous slow, generalized IMPRESSION: This study is suggestive of severe diffuse encephalopathy, nonspecific etiology.  No seizures or definite epileptiform discharges were seen throughout the recording.  Priyanka Barbra Sarks   CT ABDOMEN PELVIS WO CONTRAST  Result Date: 10/31/2020 CLINICAL DATA:  Follow-up left perinephric hematoma. EXAM: CT ABDOMEN AND PELVIS WITHOUT CONTRAST TECHNIQUE: Multidetector CT imaging of the abdomen and pelvis was performed following the standard protocol without IV contrast. COMPARISON:  10/30/2020 FINDINGS: Lower chest: Small bilateral pleural effusions with overlying subsegmental atelectasis. Hepatobiliary: No focal liver abnormality identified. Gallbladder unremarkable. No signs of biliary ductal dilatation Pancreas: Unremarkable. No pancreatic ductal dilatation or surrounding inflammatory changes. Spleen: Normal in size without focal abnormality. Adrenals/Urinary Tract: Normal appearance of the adrenal glands. Asymmetric atrophy of the right kidney is again noted. Excreted contrast material is identified within the right renal collecting system. Left kidney subcapsular hematoma is again noted. This measures 10.7 x 7.1 cm, image 60/6. Previously 10.4 x 6.6 cm. There is surrounding perinephric hemorrhage with signs of mildly progressive hemoperitoneum. Collection of high attenuation material anterior to the mid right kidney is identified measuring 4.6 x 2.7 cm, image 42/3 and likely represents extravasated contrast material from attempted percutaneous nephrostomy tube placement performed yesterday. Concentrated IV contrast material noted within the urinary bladder. Stomach/Bowel: There is an indwelling nasogastric tube with tip in the distal  stomach. High contrast material noted within the stomach and proximal duodenum. No dilated loops of large or small bowel. Vascular/Lymphatic: Aortic atherosclerosis. 3.5 cm infrarenal abdominal aortic aneurysm is again noted. 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 Reproductive: Prostate is unremarkable. Other: High attenuation  peritoneal fluid consistent with hemoperitoneum. This appears increased in volume when compared with 10/30/2020. Musculoskeletal: No acute or significant osseous findings. IMPRESSION: 1. Mild increase in size of left kidney subcapsular hematoma with signs of progressive hemoperitoneum. Hemoperitoneum is again noted and is also mildly increased in volume from previous study. 2. Collection of high attenuation material anterior to the mid right kidney likely represents extravasated contrast material. 3. Signs of retroperitoneal fibrosis with increased retroperitoneal soft tissue surrounding the abdominal aorta and encasing bilateral ureters. 4. Small bilateral pleural effusions with overlying subsegmental atelectasis. 5. 3.5 cm infrarenal abdominal aortic aneurysm. Aortic aneurysm NOS (ICD10-I71.9). 6. Aortic atherosclerosis. Aortic Atherosclerosis (ICD10-I70.0). Critical Value/emergent results were called by telephone at the time of interpretation on 10/31/2020 at 10:17 am to provider [facility] , who verbally acknowledged these results. Electronically Signed   By: Kerby Moors M.D.   On: 10/31/2020 10:18   CT ABDOMEN PELVIS WO CONTRAST  Result Date: 10/30/2020 CLINICAL DATA:  Acute abdominal pain. Nausea and vomiting. Fever. Blood in the nephrostomy tube. EXAM: CT ABDOMEN AND PELVIS WITHOUT CONTRAST TECHNIQUE: Multidetector CT imaging of the abdomen and pelvis was performed following the standard protocol without IV contrast. COMPARISON:  03/22/2018 FINDINGS: Lower chest: Lung bases are clear. Hepatobiliary: No focal liver abnormality is seen. No gallstones, gallbladder wall thickening, or biliary dilatation. Pancreas: Unremarkable. No pancreatic ductal dilatation or surrounding inflammatory changes. Spleen: Normal in size without focal abnormality. Adrenals/Urinary Tract: A percutaneous nephrostomy tube is present. The pigtail demonstrates a shallow location within the kidney suggesting that it has  withdrawn from the renal pelvis. The left kidney is diffusely heterogeneous in appearance with enlargement, heterogeneous hyperattenuation consistent with parenchymal hemorrhage, and small amounts of gas. There is extensive stranding/hematoma surrounding the left kidney, extending to the anterior and posterior pararenal fascia and into the retroperitoneum and adjacent mesentery. This may indicate vascular injury resulting from displacement of the nephrostomy tube. Alternately, trauma or spontaneous hemorrhage could be the cause of hematoma. The right kidney is atrophic. Bladder is decompressed. Stomach/Bowel: Stomach is grossly unremarkable. Small bowel and colon are diffusely decompressed. No focal abnormalities identified. Vascular/Lymphatic: There is a peripherally calcified abdominal aortic aneurysm measuring 3.6 cm in diameter, unchanged since prior study. Periaortic retroperitoneal stranding and soft tissue prominence with somewhat nodular contours. This may represent retroperitoneal fibrosis, confluent lymphadenopathy, or aortitis. Appearances are similar to the prior study. Reproductive: Prostate is unremarkable. Other: No free air in the abdomen. Abdominal wall musculature appears intact. Infiltration in the anterior subcutaneous fat could represent contusions, varices, or cellulitis. Musculoskeletal: No destructive bone lesions. Degenerative changes in the hips. 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 IMPRESSION: 1. Left percutaneous nephrostomy tube is present. The pigtail demonstrates a shallow location within the kidney suggesting that it has withdrawn from the renal pelvis. 2. Prominent left renal hematoma with extensive stranding and hematoma in the pararenal spaces and apparently extending into the mesentery. 3. Periaortic retroperitoneal stranding and soft tissue prominence suggesting retroperitoneal fibrosis, confluent lymphadenopathy, or aortitis.  Appearances are similar to the prior study. 4. 3.6 cm peripherally calcified abdominal aortic aneurysm, unchanged since prior study. 5.  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 6. Infiltration in the anterior subcutaneous fat could represent contusions, varices, or cellulitis. Aortic Atherosclerosis (ICD10-I70.0). These results were called by telephone at the time of interpretation on 10/30/2020 at 3:11 am to provider Ripley Fraise , who verbally acknowledged these results. Electronically Signed   By: Lucienne Capers M.D.   On: 10/30/2020 03:16   CT HEAD WO CONTRAST  Result Date: 10/31/2020 CLINICAL DATA:  Cerebral hemorrhage. EXAM: CT HEAD WITHOUT CONTRAST TECHNIQUE: Contiguous axial images were obtained from the base of the skull through the vertex without intravenous contrast. COMPARISON:  CT head November 27 21. FINDINGS: Brain: Postsurgical changes of left left-sided craniotomy. Similar size of an underlying subdural hemorrhage, which measures up to 1.4 cm in thickness. Single locule within this collection, unchanged. Similar associated mass effect without substantial midline shift. Similar appearance of encephalomalacia within the left frontal lobe white matter. No hydrocephalus. No evidence of new/acute large vascular territory infarct. Vascular: Calcific atherosclerosis. Skull: Left-sided craniotomy. Sinuses/Orbits: Similar complete opacification of bilateral maxillary sinuses with mineralization in the left maxillary sinus. Maxillary sinus opacification is mildly expansile with surrounding bony thickening, suggesting chronicity. Mucosal thickening of ethmoid air cells and sphenoid sinuses. Other: No mastoid effusions. IMPRESSION: 1. No substantial change in left left frontal craniotomy with subjacent 1.4 cm thick underlying subdural hematoma. Similar so shaded mass effect. 2. Left frontal e white matter encephalomalacia. 3. Similar findings of chronic  bilateral maxillary sinusitis, as detailed above. Electronically Signed   By: Margaretha Sheffield MD   On: 10/31/2020 09:51   CT Head Wo Contrast  Result Date: 10/30/2020 CLINICAL DATA:  Nausea and vomiting, fever, and blood in the nephrostomy tube starting today. EXAM: CT HEAD WITHOUT CONTRAST TECHNIQUE: Contiguous axial images were obtained from the base of the skull through the vertex without intravenous contrast. COMPARISON:  02/02/2020 FINDINGS: Brain: Subacute subdural hematoma in the left frontal region. Maximal depth measures about 1.5 cm. Mildly increased maximal thickness since the previous study, but overall, the hematoma is decreased in size since prior study. Subdural gas is also decreased. Encephalomalacia in the left frontal lobe. Improving left-to-right midline shift. Changes are all consistent with postoperative change. No ventricular dilatation. No developing acute intracranial changes. Vascular: Mild intracranial arterial vascular calcifications. Skull: Postoperative left frontotemporal parietal craniotomy with plate and screw fixation of the bone flap. No acute depressed skull fractures. Sinuses/Orbits: Expansile filling of the maxillary antra with extension into the right nasopharynx. Bone remodeling and internal calcification. No change since previous study. Changes consistent with chronic sinusitis, possibly with polyposis. Calcifications suggest atypical infection, possibly fungal superinfection. Other: None. IMPRESSION: 1. Postoperative left frontotemporal parietal craniotomy. Postoperative changes with underlying subdural hematoma, subdural gas, and encephalomalacia. Postoperative changes are decreasing since prior study. 2. Expansile opacification of the maxillary antra with internal calcifications suggesting chronic inflammatory process, possibly fungal superinfection. No change. Electronically Signed   By: Lucienne Capers M.D.   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 EXAM: 1. Fluoroscopic guided left nephrostomy tube check 2. Attempted fluoroscopic and ultrasound-guided nephrostomy track recanalization 3. Limited CT abdomen 4. Attempted CT-guided nephrostomy placement COMPARISON:  CT abdomen pelvis from 10/30/2020 and IR nephrostomy exchange from 08/19/2020 CONTRAST:  80 mL Omnipaque, intravenous FLUOROSCOPY TIME:  Minutes   seconds COMPLICATIONS: None immediate. TECHNIQUE: Informed written consent was obtained from the patient's sister after a discussion of the risks, benefits and alternatives to treatment. Questions regarding the procedure were encouraged and answered. A timeout was performed prior to the initiation of the procedure. The left flank and external portion of existing nephrostomy catheter were prepped and draped in the usual sterile fashion. A sterile drape was applied covering the operative field. Maximum barrier sterile technique with sterile gowns and gloves were used for the procedure. A timeout was performed prior to the initiation of the procedure. A pre procedural spot fluoroscopic image was obtained after contrast was injected via the existing nephrostomy catheter demonstrating withdrawal from the renal parenchyma into the pericapsular space. The tract was attempted to be recannulated with a Glidewire and Kumpe the catheter which was unable to be passed into the collecting system. Due to the coiled nature of the indwelling nephrostomy, it was thought that the pigtail portion may be blocking the entry site of the established tract into the kidney, therefore the indwelling catheter was removed. After additional manipulation of the catheter and wire combination, recanalization of the tract was  unable to be performed. Ultrasound was then used to examine the left flank for potential new nephrostomy tube placement. Due to body habitus and overlying gas, the left kidney was not visible by ultrasound. The decision was made to transfer the patient to the CT scanner for further attempt. The patient was positioned prone on the CT scanner. The left flank was prepped and draped in standard fashion. A sterile drape was applied covering the operative field. An additional time-out was performed prior to initiation of this portion of the procedure. Limited CT of the abdomen demonstrated trace residual contrast within the existing nephrostomy tract and contrast layering along the anterior aspect of the renal capsule with partial extension into the peritoneum. There is no evidence of hydronephrosis. The procedure was planned with CT. At the needle entry site, local anesthesia was administered 1% lidocaine. Deeper local anesthesia was provided to the oblique musculature with a 22 gauge spinal needle. A 22 gauge Chiba needle was then directed under intermittent fluoroscopic CT guidance to the level of the collecting system. After numerous attempts, the collecting system was unable to be cannulated. Gentle hand injection of contrast was unable to opacify the collecting system convincingly. A wire would not pass into the collecting system. After permission of the ICU team in light of the patient's renal function, 80 mL Omnipaque 300 were injected intravenously through the patient is central catheter to perform a CT IVP. Limited sequence imaging at the level of the inferior pole of the kidney was acquired at 1, 2, 5, and 10 minutes. There is no evidence of excretion of contrast material within the collecting system. At this point, the procedure was terminated and the patient was returned to the intensive care unit. The patient tolerated the procedure well without immediate postprocedural complication. FINDINGS: The existing  nephrostomy catheter is withdrawn into the perirenal space. Unsuccessful attempt at established track recanalization with fluoroscopic or ultrasound guidance. Unsuccessful attempt at percutaneous nephrostomy tube placement with CT guidance. CT IVP demonstrates severely diminished renal function without nephrogenic phase or excretion of contrast in the collecting system after 10 minutes. There is no evidence of hydronephrosis. IMPRESSION: 1. Indwelling nephrostomy tube retracted into the perirenal space. 2. Unsuccessful attempt at established nephrostomy track recanalization under fluoroscopic ultrasound guidance. 3. Unsuccessful attempt at percutaneous nephrostomy tube placement under CT guidance  due to decompression of the collecting system. Limited CT abdomen demonstrates severely diminished renal function without nephrogenic phase or collecting system target due to no evidence of contrast material excretion. PLAN: Repeat noncontrast CT abdomen in 24-48 hours to evaluate for evidence of hydronephrosis and potential target for replacement of right nephrostomy tube. Ruthann Cancer, MD [facility] Electronically Signed   By: Ruthann Cancer MD   On: 10/31/2020 08:31   DG CHEST PORT 1 VIEW  Result Date: 10/30/2020 CLINICAL DATA:  Central venous catheter placement EXAM: PORTABLE CHEST 1 VIEW COMPARISON:  10/30/2020 FINDINGS: Endotracheal and enteric tubes as well as right central venous catheter are unchanged in position. A new left central venous catheter has been placed with tip over the mid SVC region, oriented towards the side wall of the vessel. No pneumothorax. Shallow inspiration with atelectasis in the lung bases. Mild cardiac enlargement. No pleural effusions. IMPRESSION: New left central venous catheter with tip over the mid SVC region, oriented towards the side wall of the vessel. No pneumothorax. Otherwise stable appearance of the chest.  Electronically Signed   By: Lucienne Capers M.D.   On: 10/30/2020 22:58   DG CHEST PORT 1 VIEW  Result Date: 10/30/2020 CLINICAL DATA:  Hypoxia.  Central catheter placement EXAM: PORTABLE CHEST 1 VIEW COMPARISON:  October 30, 2020 study obtained earlier in the day FINDINGS: Central catheter tip is in the superior vena cava. Endotracheal tube tip is 4.4 cm above the carina. Nasogastric tube tip and side port are below the diaphragm. No pneumothorax. Lungs are clear. Heart is upper normal in size with pulmonary vascularity normal. No adenopathy. No bone lesions. IMPRESSION: Tube and catheter positions as described without pneumothorax. Lungs clear. Stable cardiac silhouette. Electronically Signed   By: Lowella Grip III M.D.   On: 10/30/2020 08:12   DG Chest Port 1 View  Result Date: 10/30/2020 CLINICAL DATA:  Intubation. EXAM: PORTABLE CHEST 1 VIEW COMPARISON:  10/30/2020 FINDINGS: Endotracheal and enteric tubes are unchanged in position. Shallow inspiration. Heart size and pulmonary vascularity are normal. Lungs are clear. No change. IMPRESSION: No active disease. Electronically Signed   By: Lucienne Capers M.D.   On: 10/30/2020 05:09   DG Chest Port 1 View  Result Date: 10/30/2020 CLINICAL DATA:  Fever. EXAM: PORTABLE CHEST 1 VIEW COMPARISON:  01/24/2020 FINDINGS: The lungs are essentially clear. There is no pneumothorax or large pleural effusion. The trachea is off midline which is felt to be secondary to some degree of patient rotation. The heart size is stable. There is no acute osseous abnormality. IMPRESSION: No active disease. Electronically Signed   By: Constance Holster M.D.   On: 10/30/2020 00:37   ECHOCARDIOGRAM COMPLETE  Result Date: 10/31/2020    ECHOCARDIOGRAM REPORT   Patient Name:   KHALEB BROZ  Date of Exam: 10/31/2020 Medical Rec #:  [id number]  Height:       73.0 in Accession #:    [id number] Weight:       276.7 lb Date of Birth:  [birth year] BSA:          2.470 m Patient  Age:    59 years   BP:           116/73 mmHg Patient Gender: M          HR:           77 bpm. Exam Location:  Inpatient Procedure: 2D Echo, Cardiac Doppler and Color Doppler Indications:  Endocarditis I38  History:        Patient has no prior history of Echocardiogram examinations.                 Risk Factors:Hypertension and Dyslipidemia. Septic shock,AKI                 (acute kidney injury),Right leg DVT.  Sonographer:    Alvino Chapel RCS Referring Phys: [id number] Candee Furbish  Sonographer Comments: Patient on continuous dialysis and mechancial ventilator during echo. IMPRESSIONS  1. Left ventricular ejection fraction, by estimation, is 65 to 70%. The left ventricle has normal function. The left ventricle has no regional wall motion abnormalities. Left ventricular diastolic parameters are consistent with Grade I diastolic dysfunction (impaired relaxation).  2. Right ventricular systolic function is normal. The right ventricular size is normal.  3. The mitral valve is abnormal. Trivial mitral valve regurgitation.  4. Possible aortic valve vegetation is visualized on the noncoronary cusp vs sclerosis and motion artifact. There is no associated aortic insufficiency. Further imaging with TEE is recommended.  5. Possible small aortic valve mass on the noncoronary cusp which is echogenic.  6. The aortic valve is tricuspid. Aortic valve regurgitation is not visualized. Conclusion(s)/Recommendation(s): Possible valvular vegetation noted on the non-coronary cusp of the aortic valve on this transthoracic echocardiogram. Would recommend a transesophageal echocardiogram to exclude infective endocarditis if clinically indicated. FINDINGS  Left Ventricle: Left ventricular ejection fraction, by estimation, is 65 to 70%. The left ventricle has normal function. The left ventricle has no regional wall motion abnormalities. The left ventricular internal cavity size was normal in size. There is  no left ventricular hypertrophy.  Left ventricular diastolic parameters are consistent with Grade I diastolic dysfunction (impaired relaxation). Indeterminate filling pressures. Right Ventricle: The right ventricular size is normal. No increase in right ventricular wall thickness. Right ventricular systolic function is normal. Left Atrium: Left atrial size was normal in size. Right Atrium: Right atrial size was normal in size. Pericardium: There is no evidence of pericardial effusion. Mitral Valve: The mitral valve is abnormal. There is mild thickening of the mitral valve leaflet(s). Trivial mitral valve regurgitation. Tricuspid Valve: The tricuspid valve is grossly normal. Tricuspid valve regurgitation is trivial. Aortic Valve: The aortic valve is tricuspid. Aortic valve regurgitation is not visualized. A vegetation is seen on the noncoronary. There is a small aortic valve mass seen on the noncoronary cusp. The AoV mass measures 5 mm x 5 mm. Pulmonic Valve: The pulmonic valve was grossly normal. Pulmonic valve regurgitation is trivial. Aorta: The aortic root and ascending aorta are structurally normal, with no evidence of dilitation. Venous: IVC assessment for right atrial pressure unable to be performed due to mechanical ventilation. IAS/Shunts: No atrial level shunt detected by color flow Doppler.  LEFT VENTRICLE PLAX 2D LVIDd:         4.60 cm  Diastology LVIDs:         2.50 cm  LV e' medial:    6.31 cm/s LV PW:         1.00 cm  LV E/e' medial:  8.7 LV IVS:        1.00 cm  LV e' lateral:   7.62 cm/s LVOT diam:     2.10 cm  LV E/e' lateral: 7.2 LV SV:         48 LV SV Index:   20 LVOT Area:     3.46 cm  RIGHT VENTRICLE RV S prime:     12.70  cm/s TAPSE (M-mode): 2.3 cm LEFT ATRIUM             Index       RIGHT ATRIUM           Index LA diam:        3.90 cm 1.58 cm/m  RA Area:     16.50 cm LA Vol (A2C):   38.5 ml 15.59 ml/m RA Volume:   42.10 ml  17.05 ml/m LA Vol (A4C):   24.9 ml 10.08 ml/m LA Biplane Vol: 31.7 ml 12.83 ml/m  AORTIC VALVE  LVOT Vmax:   81.50 cm/s LVOT Vmean:  47.800 cm/s LVOT VTI:    0.140 m  AORTA Ao Root diam: 3.70 cm MITRAL VALVE               TRICUSPID VALVE MV Area (PHT): 3.17 cm    TR Peak grad:   13.5 mmHg MV Decel Time: 239 msec    TR Vmax:        184.00 cm/s MV E velocity: 54.80 cm/s MV A velocity: 84.70 cm/s  SHUNTS MV E/A ratio:  0.65        Systemic VTI:  0.14 m                            Systemic Diam: 2.10 cm Lyman Bishop MD Electronically signed by Lyman Bishop MD Signature Date/Time: 10/31/2020/3:59:55 PM    Final    IR NEPHROSTOMY PLACEMENT LEFT  Result Date: 10/31/2020 INDICATION: 59 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 EXAM: 1. Fluoroscopic guided left nephrostomy tube check 2. Attempted fluoroscopic and ultrasound-guided nephrostomy track recanalization 3. Limited CT abdomen 4. Attempted CT-guided nephrostomy placement COMPARISON:  CT abdomen pelvis from 10/30/2020 and IR nephrostomy exchange from 08/19/2020 CONTRAST:  80 mL Omnipaque, intravenous FLUOROSCOPY TIME:  Minutes   seconds COMPLICATIONS: None immediate. TECHNIQUE: Informed written consent was obtained from the patient's sister after a discussion of the risks, benefits and alternatives to treatment. Questions regarding the procedure were encouraged and answered. A timeout was performed prior to the initiation of the procedure. The left flank and external portion of existing nephrostomy catheter were prepped and draped in the usual sterile fashion. A sterile drape was applied covering the operative field. Maximum barrier sterile technique with sterile gowns and gloves were used for the procedure. A timeout was performed prior to the initiation of the procedure. A pre procedural spot fluoroscopic image was obtained after contrast was injected via the existing nephrostomy  catheter demonstrating withdrawal from the renal parenchyma into the pericapsular space. The tract was attempted to be recannulated with a Glidewire and Kumpe the catheter which was unable to be passed into the collecting system. Due to the coiled nature of the indwelling nephrostomy, it was thought that the pigtail portion may be blocking the entry site of the established tract into the kidney, therefore the indwelling catheter was removed. After additional manipulation of the catheter and wire combination, recanalization of the tract was unable to be performed. Ultrasound was then used to examine the left flank for potential new nephrostomy tube placement. Due to body habitus and overlying gas, the left kidney was not visible by ultrasound. The decision was made to transfer the patient to the CT scanner for further attempt. The patient was positioned prone on the CT scanner. The  left flank was prepped and draped in standard fashion. A sterile drape was applied covering the operative field. An additional time-out was performed prior to initiation of this portion of the procedure. Limited CT of the abdomen demonstrated trace residual contrast within the existing nephrostomy tract and contrast layering along the anterior aspect of the renal capsule with partial extension into the peritoneum. There is no evidence of hydronephrosis. The procedure was planned with CT. At the needle entry site, local anesthesia was administered 1% lidocaine. Deeper local anesthesia was provided to the oblique musculature with a 22 gauge spinal needle. A 22 gauge Chiba needle was then directed under intermittent fluoroscopic CT guidance to the level of the collecting system. After numerous attempts, the collecting system was unable to be cannulated. Gentle hand injection of contrast was unable to opacify the collecting system convincingly. A wire would not pass into the collecting system. After permission of the ICU team in light of the  patient's renal function, 80 mL Omnipaque 300 were injected intravenously through the patient is central catheter to perform a CT IVP. Limited sequence imaging at the level of the inferior pole of the kidney was acquired at 1, 2, 5, and 10 minutes. There is no evidence of excretion of contrast material within the collecting system. At this point, the procedure was terminated and the patient was returned to the intensive care unit. The patient tolerated the procedure well without immediate postprocedural complication. FINDINGS: The existing nephrostomy catheter is withdrawn into the perirenal space. Unsuccessful attempt at established track recanalization with fluoroscopic or ultrasound guidance. Unsuccessful attempt at percutaneous nephrostomy tube placement with CT guidance. CT IVP demonstrates severely diminished renal function without nephrogenic phase or excretion of contrast in the collecting system after 10 minutes. There is no evidence of hydronephrosis. IMPRESSION: 1. Indwelling nephrostomy tube retracted into the perirenal space. 2. Unsuccessful attempt at established nephrostomy track recanalization under fluoroscopic ultrasound guidance. 3. Unsuccessful attempt at percutaneous nephrostomy tube placement under CT guidance due to decompression of the collecting system. Limited CT abdomen demonstrates severely diminished renal function without nephrogenic phase or collecting system target due to no evidence of contrast material excretion. PLAN: Repeat noncontrast CT abdomen in 24-48 hours to evaluate for evidence of hydronephrosis and potential target for replacement of right nephrostomy tube. Ruthann Cancer, MD [facility] Electronically Signed   By: Ruthann Cancer MD   On: 10/31/2020 08:31    Labs:  CBC: Recent Labs    10/30/20 1955 10/31/20 7035 10/31/20 0093 10/31/20 8182 10/31/20 0745 10/31/20 1254 11/01/20 0445 11/01/20 0554  11/01/20 1152 11/01/20 2050 11/02/20 0310 11/02/20 0312  WBC 29.8*  --  21.9*  --   --   --  18.5*  --   --   --  16.0*  --   HGB 8.4*   < > 7.5*   < >  --    < > 7.4*   < > 7.4* 8.8* 8.9* 8.5*  HCT 27.0*   < > 24.1*   < >  --    < > 23.0*   < > 23.5* 27.5* 28.1* 25.0*  PLT 118*   < > 88*  --  73*  --  74*  --   --   --  67*  --    < > = values in this interval not displayed.    COAGS: Recent Labs    01/23/20 0235 01/23/20 0235 10/30/20 0009 10/30/20 1038 10/31/20 0745 11/02/20 0813  INR 1.2   < > 1.1 1.6* 1.9* 1.2  APTT 40*  --  32 39* 46*  --    < > = values in this interval not displayed.    BMP: Recent Labs    02/10/20 0500 02/10/20 0500 02/11/20 0536 02/11/20 0536 02/12/20 0358 02/12/20 0358 04/21/20 1300 04/21/20 1325 10/31/20 1541 10/31/20 1541 11/01/20 0445 11/01/20 0554 11/01/20 0733 11/01/20 1522 11/02/20 0310 11/02/20 0312  NA 135   < > 135   < > 136   < > 139   < > 136   < > 136   < > 139 139 137 139  K 4.0   < > 4.4   < > 4.8   < > 4.5   < > 3.8   < > 3.0*   < > 2.8* 3.3* 3.2* 3.1*  CL 99   < > 100   < > 102   < > 105   < > 98  --  95*  --   --  100 98  --   CO2 23   < > 24   < > 22   < > 24   < > 23  --  26  --   --  28 25  --   GLUCOSE 151*   < > 165*   < > 184*   < > 96   < > 156*  --  183*  --   --  191* 285*  --   BUN 50*   < > 52*   < > 49*   < > 52*   < > 21  --  19  --   --  21 24*  --   CALCIUM 8.9   < > 8.9   < > 9.2   < > 9.2   < > 7.6*  --  7.7*  --   --  7.6* 7.7*  --   CREATININE 2.33*   < > 2.29*   < > 2.16*   < > 2.93*   < > 2.98*  --  2.11*  --   --  2.00* 1.86*  --   GFRNONAA 29*   < > 30*   < > 32*   < > 22*   < > 23*  --  35*  --   --  37* 41*  --   GFRAA 34*  --  35*  --  37*  --  26*  --   --   --   --   --   --   --   --   --    < > = values in this interval not displayed.    LIVER FUNCTION TESTS: Recent Labs    10/30/20 1038 10/30/20 1038 10/31/20 0452 10/31/20 0452 10/31/20 1541 11/01/20 0445 11/01/20 1522  11/02/20 0310  BILITOT 0.8  --  1.4*  --   --  1.8*  --  0.7  AST 55*  --  82*  --   --  76*  --  50*  ALT 32  --  44  --   --  46*  --  40  ALKPHOS 49  --  36*  --   --  46  --  54  PROT 4.7*  --  5.5*  --   --  6.0*  --  6.2*  ALBUMIN 2.1*   < > 2.9*   < > 3.2* 2.9* 2.7* 2.6*   < > =  values in this interval not displayed.    Assessment and Plan: Septic shock Prior chronic left percutaneous nephrostomy tube in the setting of retroperitoneal fibrosis Unsuccessful attempt to replace dislodged tube 11/27 in [facility] recs. Discussed with Dr. Serafina Royals. Will plan for non-contrasted CT tomorrow to reassess left hematoma and for presence of hydro. Then can make plan for reattempt at PCN. IR following.   Electronically Signed: Ascencion Dike, PA-C 11/02/2020, 10:40 AM   I spent a total of 15 Minutes at the the patient's bedside AND on the patient's hospital floor or unit, greater than 50% of which was counseling/coordinating care for bacteremia, retroperitoneal fibrosis.

## 2020-11-02 NOTE — Progress Notes (Signed)
Patient reaching for tube with left hand pulling covers off still does not follow directions however he does open his eyes to voice

## 2020-11-02 NOTE — Progress Notes (Signed)
PHARMACY NOTE:  ANTIMICROBIAL RENAL DOSAGE ADJUSTMENT  Current antimicrobial regimen includes a mismatch between antimicrobial dosage and estimated renal function.  As per policy approved by the Pharmacy & Therapeutics and Medical Executive Committees, the antimicrobial dosage will be adjusted accordingly.  Current antimicrobial dosage:  Ampicillin 2g IV q4h   Indication: Suspected endocarditis  Renal Function:  Estimated Creatinine Clearance: 58.1 mL/min (A) (by C-G formula based on SCr of 1.86 mg/dL (H)). []      On intermittent HD, scheduled: [x]      On CRRT    Antimicrobial dosage has been changed to:  Ampicillin 2g IV q6h   Additional Comments: Dosing more aggressively q6h given endocarditis indication     Thank you for allowing pharmacy to be a part of this patient's care.  Phillis Haggis, [facility] 11/02/2020 2:15 PM

## 2020-11-02 NOTE — Progress Notes (Signed)
[facility] Progress Note [name] DOB: 01-15-1961 MRN: [id number]   Date of Service  11/02/2020  HPI/Events of Note  Pt is NPO after midnight for TEE in the am. Nurse wants to know if she can have an order to hold the 10pm dose of levimir 10 units. He is on q4 h sensitive Novolog SSI. Last BG = 171.   eICU Interventions  Plan: 1. Hold 10 PM dose of Levemir.     Intervention Category Major Interventions: Hyperglycemia - active titration of insulin therapy  Lysle Dingwall 11/02/2020, 9:04 PM

## 2020-11-02 NOTE — Progress Notes (Signed)
[facility] for Infectious Disease  Date of Admission:  10/29/2020     Current antibiotics: Day 2 amp + CTX (11/29--pres)  Previous antibiotics: Cefepime x 1 (11/27) Cipro x 1 (11/27) Vancomycin (11/27-11/28) Meropenem (11/27-11/28) Zosyn (11/28-11/29)       Reason for visit: Follow up on E faecalis bacteremia  ASSESSMENT:    # Enterococcus faecalis bacteremia and septic shock: likely urinary source given below.  TTE with possible aortic valve vegetation # Retroperitoneal fibrosis/acute obstruction related to dislodged left PCN tube.  Unsuccessful exchange attempt per IR complicated by subcapsular hematoma and progressive hemoperitoneum by CT scan.   # Left kidney subscapular hematoma 2/2 dislodged nephrostomy tube # Acute renal failure requiring CRRT # Hx of ESBL    PLAN:    -- continue ceftriaxone and ampicillin given TTE findings due to c/f endocarditis -- TEE when able  -- IR planning for repeat CT tomorrow to reassess left hematoma and presence of hydro.  Possible reattempt at PCN -- contact precautions with history of ESBL  MEDICATIONS:    Scheduled Meds: . B-complex with vitamin C  1 tablet Per Tube Daily  . docusate  100 mg Per Tube BID  . feeding supplement (PROSource TF)  90 mL Per Tube QID  . hydrocortisone sod succinate (SOLU-CORTEF) inj  50 mg Intravenous Q12H  . [START ON 11/03/2020] hydrocortisone sod succinate (SOLU-CORTEF) inj  50 mg Intravenous Daily  . insulin aspart  0-9 Units Subcutaneous Q4H  . insulin detemir  10 Units Subcutaneous BID  . mouth rinse  15 mL Mouth Rinse 10 times per day  . pantoprazole sodium  40 mg Per Tube QHS  . polyethylene glycol  17 g Per Tube Daily  . sodium chloride flush  10-40 mL Intracatheter Q12H    Continuous Infusions: .  prismasol BGK 4/2.5 500 mL/hr at 11/02/20 0502  .  prismasol BGK 4/2.5 300 mL/hr at 11/02/20 0150  . sodium chloride 10 mL/hr at 11/02/20 1200  . sodium chloride    . ampicillin  (OMNIPEN) IV Stopped (11/02/20 1150)  . cefTRIAXone (ROCEPHIN)  IV Stopped (11/02/20 0945)  . feeding supplement (VITAL 1.5 CAL) 1,000 mL (11/02/20 1136)  . fentaNYL infusion INTRAVENOUS Stopped (11/02/20 0742)  . norepinephrine (LEVOPHED) Adult infusion Stopped (11/02/20 1045)  . potassium PHOSPHATE IVPB (in mmol) 85 mL/hr at 11/02/20 1200  . prismasol BGK 4/2.5 2,000 mL/hr at 11/02/20 0918  . propofol (DIPRIVAN) infusion Stopped (11/02/20 0505)    PRN Meds: fentaNYL, heparin, iohexol, lidocaine, sodium chloride, sodium chloride flush  SUBJECTIVE:   Unable to obtain subjective or ROS due to intubated and sedated status.    OBJECTIVE:   Allergies  Allergen Reactions  . Chlorhexidine   . Other     TB serum-reaction unknown. Provided via [facility] records     Blood pressure 106/90, pulse (!) 108, temperature 98 F (36.7 C), temperature source Axillary, resp. rate 14, height 6\' 1"  (1.854 m), weight 126.3 kg, SpO2 100 %. Body mass index is 36.74 kg/m.  Physical Exam General: Patient is currently intubated and sedated on the ventilator.  Head: Normocephalic and atraumatic.  ET tube in place Neck: Supple, right and left central lines Cardiovascular: RRR Pulmonary/Chest: Breathing assisted on vent; coarse breath sounds anteriorly. Abdominal: Soft, non-tender, non-distended Musculoskeletal: No joint deformities Extremities: No swelling or edema. No cyanosis or clubbing. Neurological: Sedated, unresponsive. Skin: Warm, dry and intact. Dry skin with bilateral evidence of venous stasis   Lab Results &  Microbiology Lab Results  Component Value Date   WBC 16.0 (H) 11/02/2020   HGB 8.8 (L) 11/02/2020   HCT 28.1 (L) 11/02/2020   MCV 85.7 11/02/2020   PLT 67 (L) 11/02/2020    Lab Results  Component Value Date   NA 139 11/02/2020   K 3.1 (L) 11/02/2020   CO2 25 11/02/2020   GLUCOSE 285 (H) 11/02/2020   BUN 24 (H) 11/02/2020   CREATININE 1.86 (H) 11/02/2020   CALCIUM 7.7 (L)  11/02/2020   GFRNONAA 41 (L) 11/02/2020   GFRAA 26 (L) 04/21/2020    Lab Results  Component Value Date   ALT 40 11/02/2020   AST 50 (H) 11/02/2020   ALKPHOS 54 11/02/2020   BILITOT 0.7 11/02/2020     I have reviewed the micro and lab results in Epic.  Imaging EEG  Result Date: 11/01/2020 Lora Havens, MD     11/01/2020 12:54 PM Patient Name: Delorean Knutzen MRN: [id number] Epilepsy Attending: Lora Havens Referring Physician/Provider: Dr. Ina Homes Date: 11/01/2020 Duration: 23.15 minutes Patient history: 59 year old male with altered mental status and questionable seizure activity.  EEG to evaluate for seizures. Level of alertness: Comatose AEDs during EEG study: None Technical aspects: This EEG study was done with scalp electrodes positioned according to the 10-20 International system of electrode placement. Electrical activity was acquired at a sampling rate of 500Hz  and reviewed with a high frequency filter of 70Hz  and a low frequency filter of 1Hz . EEG data were recorded continuously and digitally stored. Description: EEG showed continuous generalized 6 to 7 Hz theta slowing as well as intermittent generalized 2 to 3 Hz delta slowing.  Sharp transients were seen in vertex region.  Hyperventilation and photic stimulation were not performed.   ABNORMALITY -Continuous slow, generalized IMPRESSION: This study is suggestive of severe diffuse encephalopathy, nonspecific etiology.  No seizures or definite epileptiform discharges were seen throughout the recording. Lora Havens   ECHOCARDIOGRAM COMPLETE  Result Date: 10/31/2020    ECHOCARDIOGRAM REPORT   Patient Name:   JASAI SORG  Date of Exam: 10/31/2020 Medical Rec #:  [id number]  Height:       73.0 in Accession #:    [id number] Weight:       276.7 lb Date of Birth:  [birth year] BSA:          2.470 m Patient Age:    80 years   BP:           116/73 mmHg Patient Gender: M          HR:           77 bpm. Exam Location:  Inpatient Procedure:  2D Echo, Cardiac Doppler and Color Doppler Indications:    Endocarditis I38  History:        Patient has no prior history of Echocardiogram examinations.                 Risk Factors:Hypertension and Dyslipidemia. Septic shock,AKI                 (acute kidney injury),Right leg DVT.  Sonographer:    Alvino Chapel RCS Referring Phys: [id number] Candee Furbish  Sonographer Comments: Patient on continuous dialysis and mechancial ventilator during echo. IMPRESSIONS  1. Left ventricular ejection fraction, by estimation, is 65 to 70%. The left ventricle has normal function. The left ventricle has no regional wall motion abnormalities. Left ventricular diastolic parameters are consistent with Grade I diastolic dysfunction (impaired relaxation).  2.  Right ventricular systolic function is normal. The right ventricular size is normal.  3. The mitral valve is abnormal. Trivial mitral valve regurgitation.  4. Possible aortic valve vegetation is visualized on the noncoronary cusp vs sclerosis and motion artifact. There is no associated aortic insufficiency. Further imaging with TEE is recommended.  5. Possible small aortic valve mass on the noncoronary cusp which is echogenic.  6. The aortic valve is tricuspid. Aortic valve regurgitation is not visualized. Conclusion(s)/Recommendation(s): Possible valvular vegetation noted on the non-coronary cusp of the aortic valve on this transthoracic echocardiogram. Would recommend a transesophageal echocardiogram to exclude infective endocarditis if clinically indicated. FINDINGS  Left Ventricle: Left ventricular ejection fraction, by estimation, is 65 to 70%. The left ventricle has normal function. The left ventricle has no regional wall motion abnormalities. The left ventricular internal cavity size was normal in size. There is  no left ventricular hypertrophy. Left ventricular diastolic parameters are consistent with Grade I diastolic dysfunction (impaired relaxation). Indeterminate  filling pressures. Right Ventricle: The right ventricular size is normal. No increase in right ventricular wall thickness. Right ventricular systolic function is normal. Left Atrium: Left atrial size was normal in size. Right Atrium: Right atrial size was normal in size. Pericardium: There is no evidence of pericardial effusion. Mitral Valve: The mitral valve is abnormal. There is mild thickening of the mitral valve leaflet(s). Trivial mitral valve regurgitation. Tricuspid Valve: The tricuspid valve is grossly normal. Tricuspid valve regurgitation is trivial. Aortic Valve: The aortic valve is tricuspid. Aortic valve regurgitation is not visualized. A vegetation is seen on the noncoronary. There is a small aortic valve mass seen on the noncoronary cusp. The AoV mass measures 5 mm x 5 mm. Pulmonic Valve: The pulmonic valve was grossly normal. Pulmonic valve regurgitation is trivial. Aorta: The aortic root and ascending aorta are structurally normal, with no evidence of dilitation. Venous: IVC assessment for right atrial pressure unable to be performed due to mechanical ventilation. IAS/Shunts: No atrial level shunt detected by color flow Doppler.  LEFT VENTRICLE PLAX 2D LVIDd:         4.60 cm  Diastology LVIDs:         2.50 cm  LV e' medial:    6.31 cm/s LV PW:         1.00 cm  LV E/e' medial:  8.7 LV IVS:        1.00 cm  LV e' lateral:   7.62 cm/s LVOT diam:     2.10 cm  LV E/e' lateral: 7.2 LV SV:         48 LV SV Index:   20 LVOT Area:     3.46 cm  RIGHT VENTRICLE RV S prime:     12.70 cm/s TAPSE (M-mode): 2.3 cm LEFT ATRIUM             Index       RIGHT ATRIUM           Index LA diam:        3.90 cm 1.58 cm/m  RA Area:     16.50 cm LA Vol (A2C):   38.5 ml 15.59 ml/m RA Volume:   42.10 ml  17.05 ml/m LA Vol (A4C):   24.9 ml 10.08 ml/m LA Biplane Vol: 31.7 ml 12.83 ml/m  AORTIC VALVE LVOT Vmax:   81.50 cm/s LVOT Vmean:  47.800 cm/s LVOT VTI:    0.140 m  AORTA Ao Root diam: 3.70 cm MITRAL VALVE  TRICUSPID VALVE MV Area (PHT): 3.17 cm    TR Peak grad:   13.5 mmHg MV Decel Time: 239 msec    TR Vmax:        184.00 cm/s MV E velocity: 54.80 cm/s MV A velocity: 84.70 cm/s  SHUNTS MV E/A ratio:  0.65        Systemic VTI:  0.14 m                            Systemic Diam: 2.10 cm Lyman Bishop MD Electronically signed by Lyman Bishop MD Signature Date/Time: 10/31/2020/3:59:55 PM    Final        Raynelle [facility] for Infectious Disease [facility] [phone] pager 11/02/2020, 12:49 PM

## 2020-11-03 ENCOUNTER — Inpatient Hospital Stay (HOSPITAL_COMMUNITY): Payer: Medicaid Other

## 2020-11-03 DIAGNOSIS — I10 Essential (primary) hypertension: Secondary | ICD-10-CM

## 2020-11-03 DIAGNOSIS — Z1612 Extended spectrum beta lactamase (ESBL) resistance: Secondary | ICD-10-CM

## 2020-11-03 DIAGNOSIS — R7881 Bacteremia: Secondary | ICD-10-CM

## 2020-11-03 DIAGNOSIS — S37012A Minor contusion of left kidney, initial encounter: Secondary | ICD-10-CM | POA: Diagnosis not present

## 2020-11-03 DIAGNOSIS — A4181 Sepsis due to Enterococcus: Secondary | ICD-10-CM | POA: Diagnosis not present

## 2020-11-03 DIAGNOSIS — T85528A Displacement of other gastrointestinal prosthetic devices, implants and grafts, initial encounter: Secondary | ICD-10-CM

## 2020-11-03 DIAGNOSIS — N135 Crossing vessel and stricture of ureter without hydronephrosis: Secondary | ICD-10-CM

## 2020-11-03 LAB — GLUCOSE, CAPILLARY
Glucose-Capillary: 105 mg/dL — ABNORMAL HIGH (ref 70–99)
Glucose-Capillary: 111 mg/dL — ABNORMAL HIGH (ref 70–99)
Glucose-Capillary: 121 mg/dL — ABNORMAL HIGH (ref 70–99)
Glucose-Capillary: 148 mg/dL — ABNORMAL HIGH (ref 70–99)
Glucose-Capillary: 63 mg/dL — ABNORMAL LOW (ref 70–99)
Glucose-Capillary: 77 mg/dL (ref 70–99)
Glucose-Capillary: 98 mg/dL (ref 70–99)

## 2020-11-03 LAB — CBC WITH DIFFERENTIAL/PLATELET
Abs Immature Granulocytes: 0.19 10*3/uL — ABNORMAL HIGH (ref 0.00–0.07)
Basophils Absolute: 0 10*3/uL (ref 0.0–0.1)
Basophils Relative: 0 %
Eosinophils Absolute: 0.1 10*3/uL (ref 0.0–0.5)
Eosinophils Relative: 1 %
HCT: 29.8 % — ABNORMAL LOW (ref 39.0–52.0)
Hemoglobin: 9.3 g/dL — ABNORMAL LOW (ref 13.0–17.0)
Immature Granulocytes: 2 %
Lymphocytes Relative: 9 %
Lymphs Abs: 0.9 10*3/uL (ref 0.7–4.0)
MCH: 26.6 pg (ref 26.0–34.0)
MCHC: 31.2 g/dL (ref 30.0–36.0)
MCV: 85.4 fL (ref 80.0–100.0)
Monocytes Absolute: 0.8 10*3/uL (ref 0.1–1.0)
Monocytes Relative: 7 %
Neutro Abs: 8.7 10*3/uL — ABNORMAL HIGH (ref 1.7–7.7)
Neutrophils Relative %: 81 %
Platelets: 68 10*3/uL — ABNORMAL LOW (ref 150–400)
RBC: 3.49 MIL/uL — ABNORMAL LOW (ref 4.22–5.81)
RDW: 17.1 % — ABNORMAL HIGH (ref 11.5–15.5)
WBC: 10.7 10*3/uL — ABNORMAL HIGH (ref 4.0–10.5)
nRBC: 0.3 % — ABNORMAL HIGH (ref 0.0–0.2)

## 2020-11-03 LAB — COMPREHENSIVE METABOLIC PANEL
ALT: 37 U/L (ref 0–44)
AST: 43 U/L — ABNORMAL HIGH (ref 15–41)
Albumin: 2.6 g/dL — ABNORMAL LOW (ref 3.5–5.0)
Alkaline Phosphatase: 54 U/L (ref 38–126)
Anion gap: 13 (ref 5–15)
BUN: 26 mg/dL — ABNORMAL HIGH (ref 8–23)
CO2: 23 mmol/L (ref 22–32)
Calcium: 8 mg/dL — ABNORMAL LOW (ref 8.9–10.3)
Chloride: 102 mmol/L (ref 98–111)
Creatinine, Ser: 1.5 mg/dL — ABNORMAL HIGH (ref 0.61–1.24)
GFR, Estimated: 53 mL/min — ABNORMAL LOW (ref >60)
Glucose, Bld: 109 mg/dL — ABNORMAL HIGH (ref 70–99)
Potassium: 3.4 mmol/L — ABNORMAL LOW (ref 3.5–5.1)
Sodium: 138 mmol/L (ref 135–145)
Total Bilirubin: 0.5 mg/dL (ref 0.3–1.2)
Total Protein: 6.4 g/dL — ABNORMAL LOW (ref 6.5–8.1)

## 2020-11-03 LAB — RENAL FUNCTION PANEL
Albumin: 2.5 g/dL — ABNORMAL LOW (ref 3.5–5.0)
Anion gap: 11 (ref 5–15)
BUN: 27 mg/dL — ABNORMAL HIGH (ref 8–23)
CO2: 22 mmol/L (ref 22–32)
Calcium: 7.7 mg/dL — ABNORMAL LOW (ref 8.9–10.3)
Chloride: 104 mmol/L (ref 98–111)
Creatinine, Ser: 1.48 mg/dL — ABNORMAL HIGH (ref 0.61–1.24)
GFR, Estimated: 53 mL/min — ABNORMAL LOW (ref >60)
Glucose, Bld: 206 mg/dL — ABNORMAL HIGH (ref 70–99)
Phosphorus: 2.4 mg/dL — ABNORMAL LOW (ref 2.5–4.6)
Potassium: 4.1 mmol/L (ref 3.5–5.1)
Sodium: 137 mmol/L (ref 135–145)

## 2020-11-03 LAB — PHOSPHORUS: Phosphorus: 2.6 mg/dL (ref 2.5–4.6)

## 2020-11-03 LAB — MAGNESIUM: Magnesium: 2.5 mg/dL — ABNORMAL HIGH (ref 1.7–2.4)

## 2020-11-03 MED ORDER — DEXTROSE 50 % IV SOLN: 12.5000 g | INTRAVENOUS | Status: AC

## 2020-11-03 MED ORDER — DEXTROSE 50 % IV SOLN
INTRAVENOUS | Status: AC
  Administered 2020-11-03: 25 mL
  Filled 2020-11-03: qty 50

## 2020-11-03 MED ORDER — POTASSIUM CHLORIDE 10 MEQ/50ML IV SOLN
10.0000 meq | INTRAVENOUS | Status: AC
  Administered 2020-11-03 (×5): 10 meq via INTRAVENOUS
  Filled 2020-11-03 (×3): qty 50

## 2020-11-03 MED ORDER — PANTOPRAZOLE SODIUM 40 MG IV SOLR
40.0000 mg | Freq: Every day | INTRAVENOUS | Status: DC
  Administered 2020-11-03 – 2020-11-05 (×3): 40 mg via INTRAVENOUS
  Filled 2020-11-03 (×3): qty 40

## 2020-11-03 MED ORDER — SODIUM CHLORIDE 0.9 % IV SOLN: INTRAVENOUS | Status: DC

## 2020-11-03 NOTE — Progress Notes (Signed)
  Echocardiogram Echocardiogram Transesophageal has been performed.  Brendan [name] 11/03/2020, 11:06 AM

## 2020-11-03 NOTE — Progress Notes (Signed)
Aline set up/ dressing changed with assistance of RT at bedside. Blood was noted at insertion site before and after dressing change. Rn aware. No complications during to the set up change.

## 2020-11-03 NOTE — Progress Notes (Signed)
I have called Brendan [name] his sister and updated her

## 2020-11-03 NOTE — Progress Notes (Signed)
Patient ID: Brendan [name], male   DOB: [birth year], 58 y.o.   MRN: [id number] S: Able to wean off of pressors overnight. O:BP 96/75   Pulse 86   Temp (!) 93.7 F (34.3 C) (Oral)   Resp 17   Ht 6\' 1"  (1.854 m)   Wt 123.3 kg   SpO2 100%   BMI 35.86 kg/m   Intake/Output Summary (Last 24 hours) at 11/03/2020 0854 Last data filed at 11/03/2020 0800 Gross per 24 hour  Intake 2971.12 ml  Output 4730 ml  Net -1758.88 ml   Intake/Output: I/O last 3 completed shifts: In: 4296.3 [I.V.:775.9; Blood:62.5; NG/GT:1960; IV Piggyback:1497.9] Out: 4098 [Urine:950; JXBJY:7829; FAOZH:0865]  Intake/Output this shift:  Total I/O In: 133.3 [I.V.:18.7; NG/GT:50; IV Piggyback:64.7] Out: 54 [Other:54] Weight change: -3 kg Gen: intubated and sedated CVS: RRR Resp: occ rhonchi Abd: +BS, soft, nondistended Ext: 1+ BLE edema  Recent Labs  Lab 10/30/20 0009 10/30/20 0009 10/30/20 1038 10/30/20 1110 10/31/20 0452 10/31/20 0722 10/31/20 1541 10/31/20 1541 11/01/20 0445 11/01/20 0554 11/01/20 0605 11/01/20 0733 11/01/20 1522 11/02/20 0310 11/02/20 0312 11/02/20 1544 11/03/20 0505  NA 139   < > 140   < > 136   < > 136   < > 136   < > 139 139 139 137 139 135 138  K 4.3   < > 4.8   < > 5.9*   < > 3.8   < > 3.0*   < > 2.8* 2.8* 3.3* 3.2* 3.1* 3.4* 3.4*  CL 105   < > 106   < > 100  --  98  --  95*  --   --   --  100 98  --  100 102  CO2 23   < > 16*   < > 18*  --  23  --  26  --   --   --  28 25  --  23 23  GLUCOSE 165*   < > 122*   < > 260*  --  156*  --  183*  --   --   --  191* 285*  --  204* 109*  BUN 31*   < > 31*   < > 31*  --  21  --  19  --   --   --  21 24*  --  24* 26*  CREATININE 3.65*   < > 4.57*   < > 4.36*  --  2.98*  --  2.11*  --   --   --  2.00* 1.86*  --  1.68* 1.50*  ALBUMIN 3.5   < > 2.1*   < > 2.9*  --  3.2*  --  2.9*  --   --   --  2.7* 2.6*  --  2.6* 2.6*  CALCIUM 8.9   < > 7.8*   < > 7.4*  --  7.6*  --  7.7*  --   --   --  7.6* 7.7*  --  7.6* 8.0*  PHOS  --   --   --   --  3.0   --  2.0*  --  2.7  --   --   --  2.7 1.7*  --  2.8 2.6  AST 28  --  55*  --  82*  --   --   --  76*  --   --   --   --  50*  --   --  43*  ALT 28  --  32  --  44  --   --   --  46*  --   --   --   --  40  --   --  37   < > = values in this interval not displayed.   Liver Function Tests: Recent Labs  Lab 11/01/20 0445 11/01/20 1522 11/02/20 0310 11/02/20 1544 11/03/20 0505  AST 76*  --  50*  --  43*  ALT 46*  --  40  --  37  ALKPHOS 46  --  54  --  54  BILITOT 1.8*  --  0.7  --  0.5  PROT 6.0*  --  6.2*  --  6.4*  ALBUMIN 2.9*   < > 2.6* 2.6* 2.6*   < > = values in this interval not displayed.   Recent Labs  Lab 10/30/20 0009  LIPASE 26   No results for input(s): AMMONIA in the last 168 hours. CBC: Recent Labs  Lab 10/30/20 1955 10/31/20 0039 10/31/20 0452 10/31/20 0722 11/01/20 0445 11/01/20 0554 11/02/20 0310 11/02/20 0310 11/02/20 0312 11/02/20 1150 11/03/20 0505  WBC 29.8*   < > 21.9*   < > 18.5*  --  16.0*  --   --   --  10.7*  NEUTROABS  --   --  19.8*   < > 16.7*  --  14.6*  --   --   --  8.7*  HGB 8.4*   < > 7.5*   < > 7.4*   < > 8.9*   < > 8.5* 8.8* 9.3*  HCT 27.0*   < > 24.1*   < > 23.0*   < > 28.1*   < > 25.0* 28.1* 29.8*  MCV 84.9  --  84.9  --  81.9  --  85.7  --   --   --  85.4  PLT 118*   < > 88*   < > 74*  --  67*  --   --   --  68*   < > = values in this interval not displayed.   Cardiac Enzymes: No results for input(s): CKTOTAL, CKMB, CKMBINDEX, TROPONINI in the last 168 hours. CBG: Recent Labs  Lab 11/02/20 1457 11/02/20 1907 11/02/20 2306 11/03/20 0303 11/03/20 0755  GLUCAP 216* 171* 170* 121* 77    Iron Studies: No results for input(s): IRON, TIBC, TRANSFERRIN, FERRITIN in the last 72 hours. Studies/Results: EEG  Result Date: 11/01/2020 Lora Havens, MD     11/01/2020 12:54 PM [name] MRN: [id number] Epilepsy Attending: Lora Havens Referring Physician/Provider: Dr. Ina Homes Date: 11/01/2020 Duration:  23.15 minutes Patient history: 59 year old male with altered mental status and questionable seizure activity.  EEG to evaluate for seizures. Level of alertness: Comatose AEDs during EEG study: None Technical aspects: This EEG study was done with scalp electrodes positioned according to the 10-20 International system of electrode placement. Electrical activity was acquired at a sampling rate of 500Hz  and reviewed with a high frequency filter of 70Hz  and a low frequency filter of 1Hz . EEG data were recorded continuously and digitally stored. Description: EEG showed continuous generalized 6 to 7 Hz theta slowing as well as intermittent generalized 2 to 3 Hz delta slowing.  Sharp transients were seen in vertex region.  Hyperventilation and photic stimulation were not performed.   ABNORMALITY -Continuous slow, generalized IMPRESSION: This study is suggestive of severe diffuse encephalopathy, nonspecific etiology.  No seizures or  definite epileptiform discharges were seen throughout the recording. Priyanka Barbra Sarks   . B-complex with vitamin C  1 tablet Per Tube Daily  . docusate  100 mg Per Tube BID  . feeding supplement (PROSource TF)  90 mL Per Tube QID  . hydrocortisone sod succinate (SOLU-CORTEF) inj  50 mg Intravenous Daily  . insulin aspart  0-9 Units Subcutaneous Q4H  . insulin detemir  10 Units Subcutaneous BID  . mouth rinse  15 mL Mouth Rinse 10 times per day  . pantoprazole sodium  40 mg Per Tube QHS  . polyethylene glycol  17 g Per Tube Daily  . sodium chloride flush  10-40 mL Intracatheter Q12H    BMET    Component Value Date/Time   NA 138 11/03/2020 0505   K 3.4 (L) 11/03/2020 0505   CL 102 11/03/2020 0505   CO2 23 11/03/2020 0505   GLUCOSE 109 (H) 11/03/2020 0505   BUN 26 (H) 11/03/2020 0505   CREATININE 1.50 (H) 11/03/2020 0505   CALCIUM 8.0 (L) 11/03/2020 0505   GFRNONAA 53 (L) 11/03/2020 0505   GFRAA 26 (L) 04/21/2020 1300   CBC    Component Value Date/Time   WBC 10.7 (H)  11/03/2020 0505   RBC 3.49 (L) 11/03/2020 0505   HGB 9.3 (L) 11/03/2020 0505   HCT 29.8 (L) 11/03/2020 0505   PLT 68 (L) 11/03/2020 0505   MCV 85.4 11/03/2020 0505   MCH 26.6 11/03/2020 0505   MCHC 31.2 11/03/2020 0505   RDW 17.1 (H) 11/03/2020 0505   LYMPHSABS 0.9 11/03/2020 0505   MONOABS 0.8 11/03/2020 0505   EOSABS 0.1 11/03/2020 0505   BASOSABS 0.0 11/03/2020 0505     Assessment/Plan:  1. 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  1. Started on CRRT due to severe oliguric AKI on 10/30/20. 2. Dialysate and replacement fluids changed to 4K due to hypokalemia 3. Pre-filter 500 ml/hr, post filter 300 ml/hr, dialysate 2,000 ml/hr 4. UF goal to keep even but able to UF over 1 liter and off of pressors. 2. Severe Septic shock from Enterococcus faecalis bacteremia- presumably urinary source. Abx per ID and primary team. 3. 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 IR following.  1. Appreciate urology input and will retry placing left PCN in a few days or longer once hydro is present. 4. Left perinephric hematoma- continue to follow H/H and transfuse prn. 5. Thrombocytopenia- likely due to critical illness. Anticoagulation on hold. Follow platelets. 6. Hyperkalemia- improved with CRRT initiation on 10/30/20.  7. Hypophosphatemia- follow and replace as needed.   1. s/p 30 mmol Kphos 11/30 8. Acute hypoxemic respiratory failure- s/p intubation and currently on vent per PCCM. 9. AMS- EEG today. Not responding despite decreased sedation. Donetta Potts, MD [employer] [phone]

## 2020-11-03 NOTE — Progress Notes (Signed)
[facility]  Date of Admission:  10/29/2020     Current antibiotics: Day 3 amp + CTX (11/29--pres)  Previous antibiotics: Cefepime x 1 (11/27) Cipro x 1 (11/27) Vancomycin (11/27-11/28) Meropenem (11/27-11/28) Zosyn (11/28-11/29)       Reason for visit: Follow up on E faecalis bacteremia  ASSESSMENT:    # Enterococcus faecalis bacteremia and septic shock: likely urinary source given below.  TTE with possible aortic valve vegetation, however, TEE confirms no vegetations # Retroperitoneal fibrosis/acute obstruction related to dislodged left PCN tube.  Unsuccessful exchange attempt per IR complicated by subcapsular hematoma and progressive hemoperitoneum by CT scan.   # Left kidney subscapular hematoma 2/2 dislodged nephrostomy tube # Acute renal failure requiring CRRT # Hx of ESBL    PLAN:    -- continue ampicillin  -- stop ceftriaxone -- IR planning for repeat CT to reassess left hematoma and presence of hydro.  Possible reattempt at PCN -- contact precautions with history of ESBL -- blood cx cleared 11/27  MEDICATIONS:    Scheduled Meds: . B-complex with vitamin C  1 tablet Per Tube Daily  . docusate  100 mg Per Tube BID  . feeding supplement (PROSource TF)  90 mL Per Tube QID  . hydrocortisone sod succinate (SOLU-CORTEF) inj  50 mg Intravenous Daily  . insulin aspart  0-9 Units Subcutaneous Q4H  . insulin detemir  10 Units Subcutaneous BID  . mouth rinse  15 mL Mouth Rinse 10 times per day  . pantoprazole sodium  40 mg Per Tube QHS  . polyethylene glycol  17 g Per Tube Daily  . sodium chloride flush  10-40 mL Intracatheter Q12H    Continuous Infusions: .  prismasol BGK 4/2.5 500 mL/hr at 11/03/20 0209  .  prismasol BGK 4/2.5 300 mL/hr at 11/02/20 1859  . sodium chloride Stopped (11/03/20 1034)  . sodium chloride    . ampicillin (OMNIPEN) IV Stopped (11/03/20 8466)  . cefTRIAXone (ROCEPHIN)  IV Stopped (11/03/20 1022)  .  feeding supplement (VITAL 1.5 CAL) Stopped (11/03/20 0000)  . norepinephrine (LEVOPHED) Adult infusion Stopped (11/02/20 1045)  . potassium chloride 50 mL/hr at 11/03/20 1100  . prismasol BGK 4/2.5 2,000 mL/hr at 11/03/20 0956  . propofol (DIPRIVAN) infusion Stopped (11/03/20 1030)    PRN Meds: fentaNYL (SUBLIMAZE) injection, heparin, iohexol, lidocaine, sodium chloride, sodium chloride flush  SUBJECTIVE:   Unable to obtain subjective or ROS due to intubated and sedated status.    OBJECTIVE:   Allergies  Allergen Reactions  . Chlorhexidine   . Other     TB serum-reaction unknown. Provided via [facility] records     Blood pressure 93/73, pulse 95, temperature (!) 93.7 F (34.3 C), temperature source Oral, resp. rate 16, height 6\' 1"  (1.854 m), weight 123.3 kg, SpO2 100 %. Body mass index is 35.86 kg/m.  Physical Exam General: Patient is currently intubated and sedated on the ventilator.  Head: Normocephalic and atraumatic.  ET tube in place.  Eyes open. Neck: Supple, right and left central lines Cardiovascular: RRR Pulmonary/Chest: Breathing assisted on vent; coarse breath sounds anteriorly. Abdominal: Soft, non-tender, non-distended Musculoskeletal: No joint deformities Extremities: No swelling or edema. No cyanosis or clubbing. Neurological: Sedated, unresponsive.  Not following commands.  Skin: Warm, dry and intact. Dry skin with bilateral evidence of venous stasis   Lab Results & Microbiology Lab Results  Component Value Date   WBC 10.7 (H) 11/03/2020   HGB 9.3 (L) 11/03/2020  HCT 29.8 (L) 11/03/2020   MCV 85.4 11/03/2020   PLT 68 (L) 11/03/2020    Lab Results  Component Value Date   NA 138 11/03/2020   K 3.4 (L) 11/03/2020   CO2 23 11/03/2020   GLUCOSE 109 (H) 11/03/2020   BUN 26 (H) 11/03/2020   CREATININE 1.50 (H) 11/03/2020   CALCIUM 8.0 (L) 11/03/2020   GFRNONAA 53 (L) 11/03/2020   GFRAA 26 (L) 04/21/2020    Lab Results  Component Value Date   ALT 37  11/03/2020   AST 43 (H) 11/03/2020   ALKPHOS 54 11/03/2020   BILITOT 0.5 11/03/2020     I have reviewed the micro and lab results in Epic.  Imaging EEG  Result Date: 11/01/2020 Lora Havens, MD     11/01/2020 12:54 PM [name] MRN: [id number] Epilepsy Attending: Lora Havens Referring Physician/Provider: Dr. Ina Homes Date: 11/01/2020 Duration: 23.15 minutes Patient history: 59 year old male with altered mental status and questionable seizure activity.  EEG to evaluate for seizures. Level of alertness: Comatose AEDs during EEG study: None Technical aspects: This EEG study was done with scalp electrodes positioned according to the 10-20 International system of electrode placement. Electrical activity was acquired at a sampling rate of 500Hz  and reviewed with a high frequency filter of 70Hz  and a low frequency filter of 1Hz . EEG data were recorded continuously and digitally stored. Description: EEG showed continuous generalized 6 to 7 Hz theta slowing as well as intermittent generalized 2 to 3 Hz delta slowing.  Sharp transients were seen in vertex region.  Hyperventilation and photic stimulation were not performed.   ABNORMALITY -Continuous slow, generalized IMPRESSION: This study is suggestive of severe diffuse encephalopathy, nonspecific etiology.  No seizures or definite epileptiform discharges were seen throughout the recording. Lora Havens   ECHO TEE  Result Date: 11/03/2020    TRANSESOPHOGEAL ECHO REPORT   Patient Name:   Brendan [name]  Date of Exam: 11/03/2020 Medical Rec #:  [id number]  Height:       73.0 in Accession #:    [id number] Weight:       271.8 lb Date of Birth:  01-28-1961 BSA:          2.451 m Patient Age:    58 years   BP:           96/74 mmHg Patient Gender: M          HR:           102 bpm. Exam Location:  Inpatient Procedure: Transesophageal Echo Indications:    Bacteremia  History:        Patient has prior history of Echocardiogram examinations, most                  recent 10/31/2020. Risk Factors:Hypertension and Diabetes. H/O                 DVT. Stroke. CKD. GERD.  Sonographer:    Clayton Lefort RDCS (AE) Referring Phys: [id number] Freddi Starr PROCEDURE: After discussion of the risks and benefits of a TEE, an informed consent was obtained from a family member. The transesophogeal probe was passed without difficulty through the esophogus of the patient. Sedation performed by performing physician. The patient developed no complications during the procedure. Bedside TEE. IMPRESSIONS  1. Left ventricular ejection fraction, by estimation, is 60 to 65%. The left ventricle has normal function.  2. Right ventricular systolic function is normal. The right ventricular size is  normal.  3. No left atrial/left atrial appendage thrombus was detected.  4. The mitral valve is normal in structure. Trivial mitral valve regurgitation.  5. No evidence of vegetation. The aortic valve is tricuspid. Aortic valve regurgitation is trivial. Conclusion(s)/Recommendation(s): No evidence of vegetation/infective endocarditis on this transesophageal echocardiogram. FINDINGS  Left Ventricle: Left ventricular ejection fraction, by estimation, is 60 to 65%. The left ventricle has normal function. The left ventricular internal cavity size was normal in size. Right Ventricle: The right ventricular size is normal. No increase in right ventricular wall thickness. Right ventricular systolic function is normal. Left Atrium: Left atrial size was normal in size. No left atrial/left atrial appendage thrombus was detected. Right Atrium: Right atrial size was normal in size. Pericardium: There is no evidence of pericardial effusion. Mitral Valve: The mitral valve is normal in structure. Trivial mitral valve regurgitation. Tricuspid Valve: The tricuspid valve is normal in structure. Tricuspid valve regurgitation is not demonstrated. Aortic Valve: No evidence of vegetation. The aortic valve is tricuspid.  Aortic valve regurgitation is trivial. Pulmonic Valve: The pulmonic valve was normal in structure. Pulmonic valve regurgitation is trivial. Aorta: The aortic root is normal in size and structure. IAS/Shunts: No atrial level shunt detected by color flow Doppler. Candee Furbish MD Electronically signed by Candee Furbish MD Signature Date/Time: 11/03/2020/11:11:07 AM    Final        [facility] [phone] pager 11/03/2020, 11:26 AM

## 2020-11-03 NOTE — CV Procedure (Signed)
   Transesophageal Echocardiogram  Indications: Bacteremia, ? vegetation  Time out performed  Propofol used with nursing assistance while patient was intubated, ETT in place.   Findings:  Left Ventricle: 65% EF  Mitral Valve: Trace MR, normal  Aortic Valve: Normal, trace AI, no vegetation  Tricuspid Valve: Normal  Left Atrium: Normal no LAA thrombus   IMPRESSION: NO VEGETATION, NO ENDOCARDITIS  Candee Furbish, MD

## 2020-11-03 NOTE — Progress Notes (Signed)
Pt self extubated. CCM MD at bedside. Placed was placed on 2 L [place]. RT will monitor.

## 2020-11-03 NOTE — Progress Notes (Signed)
Hypoglycemic Event  CBG: 63  Treatment: D50 25 mL (12.5 gm)  Symptoms: None  Follow-up CBG: Time: 2335 CBG Result:105  Possible Reasons for Event: Inadequate meal intake   Berna Spare

## 2020-11-03 NOTE — Progress Notes (Addendum)
Patient pulled ETT parially out with left hand while I was at bedside with him .Nasal canula applied and Dr Erin Fulling called to bedside. Patient's saturating at 97 his respiratory rate is 28-48 and is alert to person and asking for ice water.

## 2020-11-03 NOTE — Progress Notes (Signed)
Transported pt from 2M02 to CT scan and back without complication.

## 2020-11-03 NOTE — Progress Notes (Signed)
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 He presented to OSH ED with nausea, vomiting, fever and blood in his nephrostomy tube that began the day of admission. Imaging in the ED found a renal hematoma.  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 He presented to OSH ED with nausea, vomiting, fever and blood in his nephrostomy tube that began the day of admission.   In the ED, code sepsis was called due to concern for infection, fluids and cefepime were given. CT abdomen pelvis revealed shallow location of left percutaneous nephrostomy tube suggesting that it has withdrawn from the renal pelvis and a prominent left renal hematoma. He was tachycardic to the 160s and febrile to 104. CXR was unrevealing.   Per ED documentation, urology recommended admission to intensive care and reversal of anticoagulation. 1 dose of protamine 50mg  was administered to reverse the nursing home reported anticoagulation (per ED documentation, his last dose of heparin subcutaneous 5000 units was at 2200 on November 26th).   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 IVC filter not able to be placed due to extensive DVT -Subdural hematoma, s/p craniotomy 01/26/2020  Significant Hospital Events   Presented 10/30/20 to OSH ED, found to have renal hematoma and concern for sepsis  Consults:  Urology Nephrology Interventional Radiology Infectious Disease  Procedures:  NA  Significant Diagnostic Tests:   10/30/20 CT Abdomen Pelvis WO contrast: 1. Left percutaneous nephrostomy tube present, shallow location within the kidney suggesting that it has withdrawn from the renal pelvis 2. Prominent left renal hematoma with extensive stranding and hematoma in the pararenal spaces and apparently extending into the mesentery 3. Periaortic retroperitoneal stranding and soft tissue prominence suggesting retroperitoneal fibrosis, confluent lymphadenopathy, or aortitis. Appearances are similar to the prior study  10/31/20 CT Head 1. No substantial change in left left frontal craniotomy with subjacent 1.4 cm thick underlying subdural hematoma. Similar so shaded mass effect. 2. Left frontal e white matter encephalomalacia. 3. Similar findings of chronic bilateral maxillary sinusitis, as detailed above.  10/31/20 CT Abdomen/Pelvis 1. Mild increase in size of left kidney subcapsular hematoma with signs of progressive hemoperitoneum. Hemoperitoneum is again noted and is also mildly increased in volume from previous study. 2. Collection of high attenuation material anterior to the mid right kidney likely represents extravasated contrast material. 3. Signs of retroperitoneal fibrosis with increased retroperitoneal soft tissue surrounding the abdominal aorta and encasing bilateral ureters. 4. Small bilateral pleural effusions with overlying subsegmental atelectasis. 5. 3.5 cm infrarenal abdominal aortic  aneurysm. Aortic aneurysm NOS (ICD10-I71.9). 6. Aortic atherosclerosis.  10/31/2020 ECHO 1. Left ventricular ejection fraction, by estimation, is  65 to 70%. The  left ventricle has normal function. The left ventricle has no regional  wall motion abnormalities. Left ventricular diastolic parameters are  consistent with Grade I diastolic  dysfunction (impaired relaxation).  2. Right ventricular systolic function is normal. The right ventricular  size is normal.  3. The mitral valve is abnormal. Trivial mitral valve regurgitation.  4. Possible aortic valve vegetation is visualized on the noncoronary cusp  vs sclerosis and motion artifact. There is no associated aortic  insufficiency. Further imaging with TEE is recommended.  5. Possible small aortic valve mass on the noncoronary cusp which is  echogenic.  6. The aortic valve is tricuspid. Aortic valve regurgitation is not  Visualized.  Micro Data:   10/30/20 Blood culture- E faecalis Urine culture- No growth  Antimicrobials:  11/26 Cefepime 11/27 Ciprofloxacin 11/27 Meropenem 11/27 Vancomycin 11/28 Zosyn  11/29 Ceftriaxone + Ampicillin>>  Interim history/subjective:   Off vasopressor support  TEE Planned today  No acute events overnight per nursing. CRRT is currently off due to clogged filter but will be restarting soon  Objective   Blood pressure 101/73, pulse 86, temperature (!) 93.8 F (34.3 C), temperature source Axillary, resp. rate 15, height 6\' 1"  (1.854 m), weight 123.3 kg, SpO2 100 %.    Vent Mode: PRVC FiO2 (%):  [30 %] 30 % Set Rate:  [14 bmp] 14 bmp Vt Set:  [580 mL] 580 mL PEEP:  [5 cmH20] 5 cmH20 Pressure Support:  [10 cmH20] 10 cmH20 Plateau Pressure:  [13 cmH20-17 cmH20] 13 cmH20   Intake/Output Summary (Last 24 hours) at 11/03/2020 7124 Last data filed at 11/03/2020 0800 Gross per 24 hour  Intake 2971.12 ml  Output 4730 ml  Net -1758.88 ml   Filed Weights   11/01/20 0405 11/02/20 0500  11/03/20 0600  Weight: 124.3 kg 126.3 kg 123.3 kg    Examination: Constitutional: ill appearing, sedated, intubated Eyes: pupils pin-point but reactive, sclera anicteric Ears, nose, mouth, and throat: ETT in place, minimal secretions Cardiovascular: RRR, ext warm Respiratory: Clear to auscultation bilaterally Gastrointestinal: soft, +BS Skin: No rashes, normal turgor Neurologic: sedated, no spontaneous movement  Resolved Hospital Problem list   NA  Assessment & Plan:  Mr. 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 He presented to OSH ED with nausea, vomiting, fever and blood in his nephrostomy tube that began the day of admission. Imaging in the ED found a renal hematoma.  Severe septic shock secondary to E faecalis bacteremia  Suspected source is seeding from markedly abnormal peri-renal architecture.  Also baseline immunocompromised with cellcept. - ID Following, antibiotics changed to ceftriaxone + ampicillin on 11/29 - TEE performed today. No evidence of valvular vegetations - Off pressors 12/1  Left perinephric hematoma from dislodged nephrostomy tube Underlying retroperitoneal fibrosis - IR following, unsuccessful attempt at replacing dislodge nephrostomy tube - Perinephric hematoma has increased slightly in size. Plan for repeat CT abdomen today. - Urology consulted. Recommend continued monitoring of perinephric hematoma and patient will require left PCN placement at some point  - Hold cellcept - Monitor H/H  Thrombocytopenia- related to acute illness - AC on hold - monitor daily - Fibrinogen WNL  Severe acute renal failure with hyperkalemia- started on CRRT 11/27. - Nephrology following  Acute hypoxemic respiratory failure- due to marked acidemia, inability to protect airway from metabolic disarray - Vent support with lung  protective tidal volumes -  VAP prevention bundle - Will plan for SBT after CT abdomen and TEE today  Acute metabolic encephalopathy and question of associated seizure 11/27-28 - EEG negative for seizure activity, consistent with diffuse encephalopathy, non-specific.  - Versed discontinued 11/29  Hyperglycemia - 10 units levemir BID - Sensitive sliding scale  Best practice (evaluated daily)   Diet: TF Pain/Anxiety/Delirium protocol (if indicated): fentanyl VAP protocol (if indicated): ordered DVT prophylaxis: Hold in setting of renal hematoma, SCDs ordered GI prophylaxis: NA Glucose control: levemir, SSI Mobility: PT/OT when able last date of multidisciplinary goals of care discussion:  Family and staff present: phone, nurse + md Summary of discussion: continue aggressive care Follow up goals of care discussion due: 12/4 Code Status: Full Disposition: ICU status   Patient critically ill due to multiorgan failure Interventions to address this today vent/CRRT/pressors Risk of deterioration without these interventions is high  I personally spent 40 minutes providing critical care not including any separately billable procedures  Freda Jackson, MD [place] Office: [phone]   See Amion for Pager Details

## 2020-11-03 NOTE — Plan of Care (Signed)
  Problem: Safety: Goal: Ability to remain free from injury will improve Outcome: Progressing   

## 2020-11-03 NOTE — Progress Notes (Signed)
Inpatient Diabetes Program Recommendations  AACE/ADA: New Consensus Statement on Inpatient Glycemic Control (2015)  Target Ranges:  Prepandial:   less than 140 mg/dL      Peak postprandial:   less than 180 mg/dL (1-2 hours)      Critically ill patients:  140 - 180 mg/dL   Lab Results  Component Value Date   GLUCAP 98 11/03/2020   HGBA1C 5.0 01/24/2020    Review of Glycemic Control Results for HARUO, STEPANEK (MRN [id number]) as of 11/03/2020 12:58  Ref. Range 11/02/2020 19:07 11/02/2020 23:06 11/03/2020 03:03 11/03/2020 07:55 11/03/2020 11:55  Glucose-Capillary Latest Ref Range: 70 - 99 mg/dL 171 (H) 170 (H) 121 (H) 77 98   Diabetes history: None Current orders for Inpatient glycemic control:  Levemir 10 units bid Solucortef 50 mg daily Novolog sensitive q 4 hours  Inpatient Diabetes Program Recommendations:    Note feeds off for TEE today. Blood sugars <100 mg/dL this AM.  Agree with holding Levemir this AM.  If blood sugars continue to be <120 mg/dL, consider reducing Levemir to 5 units bid.    Thanks  Adah Perl, RN, BC-ADM Inpatient Diabetes Coordinator Pager [phone] (8a-5p)

## 2020-11-04 DIAGNOSIS — B952 Enterococcus as the cause of diseases classified elsewhere: Secondary | ICD-10-CM

## 2020-11-04 DIAGNOSIS — K661 Hemoperitoneum: Secondary | ICD-10-CM

## 2020-11-04 DIAGNOSIS — R7881 Bacteremia: Secondary | ICD-10-CM

## 2020-11-04 LAB — CBC WITH DIFFERENTIAL/PLATELET
Abs Immature Granulocytes: 0.3 10*3/uL — ABNORMAL HIGH (ref 0.00–0.07)
Basophils Absolute: 0 10*3/uL (ref 0.0–0.1)
Basophils Relative: 1 %
Eosinophils Absolute: 0.2 10*3/uL (ref 0.0–0.5)
Eosinophils Relative: 3 %
HCT: 29.6 % — ABNORMAL LOW (ref 39.0–52.0)
Hemoglobin: 9.4 g/dL — ABNORMAL LOW (ref 13.0–17.0)
Immature Granulocytes: 4 %
Lymphocytes Relative: 13 %
Lymphs Abs: 1.1 10*3/uL (ref 0.7–4.0)
MCH: 27.2 pg (ref 26.0–34.0)
MCHC: 31.8 g/dL (ref 30.0–36.0)
MCV: 85.8 fL (ref 80.0–100.0)
Monocytes Absolute: 0.9 10*3/uL (ref 0.1–1.0)
Monocytes Relative: 11 %
Neutro Abs: 5.9 10*3/uL (ref 1.7–7.7)
Neutrophils Relative %: 68 %
Platelets: 82 10*3/uL — ABNORMAL LOW (ref 150–400)
RBC: 3.45 MIL/uL — ABNORMAL LOW (ref 4.22–5.81)
RDW: 17.2 % — ABNORMAL HIGH (ref 11.5–15.5)
WBC: 8.5 10*3/uL (ref 4.0–10.5)
nRBC: 0.6 % — ABNORMAL HIGH (ref 0.0–0.2)

## 2020-11-04 LAB — COMPREHENSIVE METABOLIC PANEL
ALT: 47 U/L — ABNORMAL HIGH (ref 0–44)
AST: 57 U/L — ABNORMAL HIGH (ref 15–41)
Albumin: 2.7 g/dL — ABNORMAL LOW (ref 3.5–5.0)
Alkaline Phosphatase: 67 U/L (ref 38–126)
Anion gap: 12 (ref 5–15)
BUN: 26 mg/dL — ABNORMAL HIGH (ref 8–23)
CO2: 24 mmol/L (ref 22–32)
Calcium: 8.1 mg/dL — ABNORMAL LOW (ref 8.9–10.3)
Chloride: 101 mmol/L (ref 98–111)
Creatinine, Ser: 1.54 mg/dL — ABNORMAL HIGH (ref 0.61–1.24)
GFR, Estimated: 51 mL/min — ABNORMAL LOW (ref >60)
Glucose, Bld: 108 mg/dL — ABNORMAL HIGH (ref 70–99)
Potassium: 3.8 mmol/L (ref 3.5–5.1)
Sodium: 137 mmol/L (ref 135–145)
Total Bilirubin: 0.7 mg/dL (ref 0.3–1.2)
Total Protein: 6.6 g/dL (ref 6.5–8.1)

## 2020-11-04 LAB — GLUCOSE, CAPILLARY
Glucose-Capillary: 92 mg/dL (ref 70–99)
Glucose-Capillary: 91 mg/dL (ref 70–99)
Glucose-Capillary: 106 mg/dL — ABNORMAL HIGH (ref 70–99)
Glucose-Capillary: 100 mg/dL — ABNORMAL HIGH (ref 70–99)
Glucose-Capillary: 122 mg/dL — ABNORMAL HIGH (ref 70–99)
Glucose-Capillary: 95 mg/dL (ref 70–99)

## 2020-11-04 LAB — CULTURE, BLOOD (ROUTINE X 2)
Culture: NO GROWTH
Culture: NO GROWTH

## 2020-11-04 LAB — RENAL FUNCTION PANEL
Albumin: 2.7 g/dL — ABNORMAL LOW (ref 3.5–5.0)
Albumin: 2.6 g/dL — ABNORMAL LOW (ref 3.5–5.0)
Anion gap: 11 (ref 5–15)
Anion gap: 14 (ref 5–15)
BUN: 27 mg/dL — ABNORMAL HIGH (ref 8–23)
BUN: 22 mg/dL (ref 8–23)
CO2: 24 mmol/L (ref 22–32)
CO2: 24 mmol/L (ref 22–32)
Calcium: 8.1 mg/dL — ABNORMAL LOW (ref 8.9–10.3)
Calcium: 8.3 mg/dL — ABNORMAL LOW (ref 8.9–10.3)
Chloride: 103 mmol/L (ref 98–111)
Chloride: 102 mmol/L (ref 98–111)
Creatinine, Ser: 1.53 mg/dL — ABNORMAL HIGH (ref 0.61–1.24)
Creatinine, Ser: 1.47 mg/dL — ABNORMAL HIGH (ref 0.61–1.24)
GFR, Estimated: 51 mL/min — ABNORMAL LOW (ref >60)
GFR, Estimated: 54 mL/min — ABNORMAL LOW (ref >60)
Glucose, Bld: 108 mg/dL — ABNORMAL HIGH (ref 70–99)
Glucose, Bld: 124 mg/dL — ABNORMAL HIGH (ref 70–99)
Phosphorus: 2.6 mg/dL (ref 2.5–4.6)
Phosphorus: 2.5 mg/dL (ref 2.5–4.6)
Potassium: 3.8 mmol/L (ref 3.5–5.1)
Potassium: 4.3 mmol/L (ref 3.5–5.1)
Sodium: 138 mmol/L (ref 135–145)
Sodium: 140 mmol/L (ref 135–145)

## 2020-11-04 LAB — MAGNESIUM: Magnesium: 2.4 mg/dL (ref 1.7–2.4)

## 2020-11-04 MED ORDER — INSULIN DETEMIR 100 UNIT/ML ~~LOC~~ SOLN
5.0000 [IU] | Freq: Two times a day (BID) | SUBCUTANEOUS | Status: DC
  Administered 2020-11-04 – 2020-11-06 (×5): 5 [IU] via SUBCUTANEOUS
  Filled 2020-11-04 (×7): qty 0.05

## 2020-11-04 MED ORDER — IPRATROPIUM-ALBUTEROL 0.5-2.5 (3) MG/3ML IN SOLN
3.0000 mL | Freq: Four times a day (QID) | RESPIRATORY_TRACT | Status: DC
  Administered 2020-11-04 – 2020-11-05 (×3): 3 mL via RESPIRATORY_TRACT
  Filled 2020-11-04 (×4): qty 3

## 2020-11-04 NOTE — Progress Notes (Signed)
Patient ID: Brendan [name], male   DOB: 1961-06-15, 59 y.o.   MRN: [id number] S: Pt self extubated yesterday afternoon and has remained off of vent and pressors since.  O:BP (!) 107/39 (BP Location: Left Arm)   Pulse (!) 109   Temp 98.2 F (36.8 C)   Resp 18   Ht 6\' 1"  (1.854 m)   Wt 122.3 kg   SpO2 100%   BMI 35.57 kg/m   Intake/Output Summary (Last 24 hours) at 11/04/2020 1023 Last data filed at 11/04/2020 1000 Gross per 24 hour  Intake 1307.06 ml  Output 2716 ml  Net -1408.94 ml   Intake/Output: I/O last 3 completed shifts: In: 2457.2 [I.V.:487; NG/GT:920; IV Piggyback:1050.2] Out: 4259 [Urine:1170; DGLOV:5643; Stool:600]  Intake/Output this shift:  Total I/O In: 30 [I.V.:30] Out: 111 [Other:111] Weight change: -1 kg Gen: somnolent, will open eyes but non verbal CVS: tachy at 109 Resp: cta Abd: +BS, soft, NT/ND Ext: 2+ BLE edema  Recent Labs  Lab 10/30/20 0009 10/30/20 0009 10/30/20 1038 10/30/20 1110 10/31/20 0452 10/31/20 0722 11/01/20 0445 11/01/20 0554 11/01/20 1522 11/02/20 0310 11/02/20 0312 11/02/20 1544 11/03/20 0505 11/03/20 1543 11/04/20 0304  NA 139   < > 140   < > 136   < > 136   < > 139 137 139 135 138 137 137  138  K 4.3   < > 4.8   < > 5.9*   < > 3.0*   < > 3.3* 3.2* 3.1* 3.4* 3.4* 4.1 3.8  3.8  CL 105   < > 106   < > 100   < > 95*  --  100 98  --  100 102 104 101  103  CO2 23   < > 16*   < > 18*   < > 26  --  28 25  --  23 23 22 24  24   GLUCOSE 165*   < > 122*   < > 260*   < > 183*  --  191* 285*  --  204* 109* 206* 108*  108*  BUN 31*   < > 31*   < > 31*   < > 19  --  21 24*  --  24* 26* 27* 26*  27*  CREATININE 3.65*   < > 4.57*   < > 4.36*   < > 2.11*  --  2.00* 1.86*  --  1.68* 1.50* 1.48* 1.54*  1.53*  ALBUMIN 3.5   < > 2.1*   < > 2.9*   < > 2.9*  --  2.7* 2.6*  --  2.6* 2.6* 2.5* 2.7*  2.7*  CALCIUM 8.9   < > 7.8*   < > 7.4*   < > 7.7*  --  7.6* 7.7*  --  7.6* 8.0* 7.7* 8.1*  8.1*  PHOS  --   --   --   --  3.0   < > 2.7  --  2.7  1.7*  --  2.8 2.6 2.4* 2.6  AST 28  --  55*  --  82*  --  76*  --   --  50*  --   --  43*  --  57*  ALT 28  --  32  --  44  --  46*  --   --  40  --   --  37  --  47*   < > = values in this interval not displayed.   Liver Function  Tests: Recent Labs  Lab 11/02/20 0310 11/02/20 1544 11/03/20 0505 11/03/20 1543 11/04/20 0304  AST 50*  --  43*  --  57*  ALT 40  --  37  --  47*  ALKPHOS 54  --  54  --  67  BILITOT 0.7  --  0.5  --  0.7  PROT 6.2*  --  6.4*  --  6.6  ALBUMIN 2.6*   < > 2.6* 2.5* 2.7*  2.7*   < > = values in this interval not displayed.   Recent Labs  Lab 10/30/20 0009  LIPASE 26   No results for input(s): AMMONIA in the last 168 hours. CBC: Recent Labs  Lab 10/31/20 0452 10/31/20 0722 11/01/20 0445 11/01/20 0554 11/02/20 0310 11/02/20 0312 11/02/20 1150 11/03/20 0505 11/04/20 0304  WBC 21.9*   < > 18.5*   < > 16.0*  --   --  10.7* 8.5  NEUTROABS 19.8*   < > 16.7*   < > 14.6*  --   --  8.7* 5.9  HGB 7.5*   < > 7.4*   < > 8.9*   < > 8.8* 9.3* 9.4*  HCT 24.1*   < > 23.0*   < > 28.1*   < > 28.1* 29.8* 29.6*  MCV 84.9  --  81.9  --  85.7  --   --  85.4 85.8  PLT 88*   < > 74*   < > 67*  --   --  68* 82*   < > = values in this interval not displayed.   Cardiac Enzymes: No results for input(s): CKTOTAL, CKMB, CKMBINDEX, TROPONINI in the last 168 hours. CBG: Recent Labs  Lab 11/03/20 1907 11/03/20 2305 11/03/20 2335 11/04/20 0303 11/04/20 0804  GLUCAP 111* 63* 105* 92 91    Iron Studies: No results for input(s): IRON, TIBC, TRANSFERRIN, FERRITIN in the last 72 hours. 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 Please assess left-sided perinephric hematoma as well as for development of left-sided hydronephrosis. EXAM: CT ABDOMEN AND PELVIS WITHOUT CONTRAST TECHNIQUE: Multidetector CT imaging of the abdomen and pelvis was performed following the standard protocol without IV contrast. COMPARISON:  CT abdomen and pelvis-10/31/2020; 10/30/2020 Attempted fluoroscopic and CT guided left-sided nephrostomy catheter replacement/placement-10/30/2020 Fluoroscopic guided left-sided nephrostomy catheter exchange-08/19/2020 FINDINGS: Lower chest: Limited visualization of lower thorax demonstrates interval increase in size of trace/small bilateral effusions and associated worsening bibasilar consolidative opacities. Scattered air bronchograms are seen within the left lower lobe. Borderline cardiomegaly. Diffuse decreased attenuation of the intra cardiac blood pool suggestive of anemia. Coronary artery calcifications. No pericardial effusion. Hepatobiliary: Normal hepatic contour. Normal noncontrast appearance of the gallbladder given degree distention. No radiopaque gallstones. No ascites. Pancreas: Normal noncontrast appearance of the pancreas. Spleen: Normal noncontrast appearance of the spleen. Adrenals/Urinary Tract: While challenging to accurately measure, the left-sided perinephric hematoma is grossly unchanged compared to CT scan performed before attempted left-sided nephrostomy catheter replacement/placement, currently measuring 3.8 x 9.7 cm (axial image 43, series 3; coronal image 87, series 6), previously, 4.6 x 9.9 cm. There is a small amount of air again seen within the perinephric hematoma likely the sequela of attempted replacement/placement. The amount of  blood within the dependent portion of pericolic gutter has subjectively reduced in the interval (represent image 41, series 3). There has been no development of left-sided hydronephrosis suggestive of little to no functionality of  the left kidney versus incomplete occlusion of the ureter. The right kidney remains markedly atrophic again without evidence of right-sided urinary obstruction. Normal noncontrast appearance the bilateral adrenal glands. Normal appearance of the urinary bladder given underdistention. Stomach/Bowel: Appropriately position rectal tube. Unremarkable colonic stool burden. No evidence of enteric obstruction. Normal noncontrast appearance of the terminal ileum and appendix. A minimal amount of residual contrast is seen within the tip of the appendix. No pneumoperitoneum, pneumatosis or portal venous gas. Vascular/Lymphatic: Redemonstrated aneurysmal dilatation of the infrarenal abdominal aorta measuring approximately 3.2 cm in diameter (image 59, series 3). Redemonstrated second parental soft tissue about the aorta and retroperitoneum compatible with provided history of retroperitoneal fibrosis, grossly unchanged. Reproductive: Normal appearance the prostate gland. No free fluid the pelvic cul-de-sac. Other: Diffuse body wall anasarca. Multiple subcutaneous nodules are identified about the undersurface of the abdominal pannus, likely at the location of subcutaneous medication administration. Musculoskeletal: No acute or aggressive osseous abnormalities. 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 IMPRESSION: 1. Unchanged left-sided perinephric hematoma compared to CT scan performed prior to attempted left-sided nephrostomy catheter replacement/placement with interval reduction in the amount of blood products within the dependent portion of the left pararenal space. 2. No development of left-sided pelvicaliectasis despite lack of left-sided nephrostomy catheter - this finding suggests either lack of functionality left kidney or in complete occlusion of the left  ureter. 3. Chronic atrophy of the right kidney, again without evidence of urinary obstruction. 4. Similar findings of retroperitoneal fibrosis. 5. Appropriate positioned rectal tube without evidence of enteric obstruction. 6. Redemonstrated abdominal aortic aneurysm measuring 3.2 cm in diameter. 3.0 - 3.4 cm Abdominal Aortic Aneurysm (ICD10-I71.9). Recommend follow-up aortic ultrasound in 3 years. This recommendation follows ACR consensus guidelines: White Paper of the ACR Incidental Findings Committee II on Vascular Findings. J Am Coll Radiol 2013; 10:789-794. 7. Aortic Atherosclerosis (ICD10-I70.0). Electronically Signed   By: Sandi Mariscal M.D.   On: 11/04/2020 08:31   ECHO TEE  Result Date: 11/03/2020    TRANSESOPHOGEAL ECHO REPORT   Patient Name:   Brendan [name]  Date of Exam: 11/03/2020 Medical Rec #:  [id number]  Height:       73.0 in Accession #:    [id number] Weight:       271.8 lb Date of Birth:  Nov 02, 1961 BSA:          2.451 m Patient Age:    34 years   BP:           96/74 mmHg Patient Gender: M          HR:           102 bpm. Exam Location:  Inpatient Procedure: Transesophageal Echo Indications:    Bacteremia  History:        Patient has prior history of Echocardiogram examinations, most                 recent 10/31/2020. Risk Factors:Hypertension and Diabetes. H/O                 DVT. Stroke. CKD. GERD.  Sonographer:    Clayton Lefort RDCS (AE) Referring Phys: [id number] Freddi Starr PROCEDURE: After discussion of  the risks and benefits of a TEE, an informed consent was obtained from a family member. The transesophogeal probe was passed without difficulty through the esophogus of the patient. Sedation performed by performing physician. The patient developed no complications during the procedure. Bedside TEE. IMPRESSIONS  1. Left ventricular ejection fraction, by estimation, is 60 to 65%. The left ventricle has normal function.  2. Right ventricular systolic function is normal. The right ventricular size is  normal.  3. No left atrial/left atrial appendage thrombus was detected.  4. The mitral valve is normal in structure. Trivial mitral valve regurgitation.  5. No evidence of vegetation. The aortic valve is tricuspid. Aortic valve regurgitation is trivial. Conclusion(s)/Recommendation(s): No evidence of vegetation/infective endocarditis on this transesophageal echocardiogram. FINDINGS  Left Ventricle: Left ventricular ejection fraction, by estimation, is 60 to 65%. The left ventricle has normal function. The left ventricular internal cavity size was normal in size. Right Ventricle: The right ventricular size is normal. No increase in right ventricular wall thickness. Right ventricular systolic function is normal. Left Atrium: Left atrial size was normal in size. No left atrial/left atrial appendage thrombus was detected. Right Atrium: Right atrial size was normal in size. Pericardium: There is no evidence of pericardial effusion. Mitral Valve: The mitral valve is normal in structure. Trivial mitral valve regurgitation. Tricuspid Valve: The tricuspid valve is normal in structure. Tricuspid valve regurgitation is not demonstrated. Aortic Valve: No evidence of vegetation. The aortic valve is tricuspid. Aortic valve regurgitation is trivial. Pulmonic Valve: The pulmonic valve was normal in structure. Pulmonic valve regurgitation is trivial. Aorta: The aortic root is normal in size and structure. IAS/Shunts: No atrial level shunt detected by color flow Doppler. Candee Furbish MD Electronically signed by Candee Furbish MD Signature Date/Time: 11/03/2020/11:11:07 AM    Final    . B-complex with vitamin C  1 tablet Per Tube Daily  . docusate  100 mg Per Tube BID  . feeding supplement (PROSource TF)  90 mL Per Tube QID  . hydrocortisone sod succinate (SOLU-CORTEF) inj  50 mg Intravenous Daily  . insulin aspart  0-9 Units Subcutaneous Q4H  . insulin detemir  10 Units Subcutaneous BID  . mouth rinse  15 mL Mouth Rinse 10 times  per day  . pantoprazole (PROTONIX) IV  40 mg Intravenous QHS  . polyethylene glycol  17 g Per Tube Daily  . sodium chloride flush  10-40 mL Intracatheter Q12H    BMET    Component Value Date/Time   NA 137 11/04/2020 0304   NA 138 11/04/2020 0304   K 3.8 11/04/2020 0304   K 3.8 11/04/2020 0304   CL 101 11/04/2020 0304   CL 103 11/04/2020 0304   CO2 24 11/04/2020 0304   CO2 24 11/04/2020 0304   GLUCOSE 108 (H) 11/04/2020 0304   GLUCOSE 108 (H) 11/04/2020 0304   BUN 26 (H) 11/04/2020 0304   BUN 27 (H) 11/04/2020 0304   CREATININE 1.54 (H) 11/04/2020 0304   CREATININE 1.53 (H) 11/04/2020 0304   CALCIUM 8.1 (L) 11/04/2020 0304   CALCIUM 8.1 (L) 11/04/2020 0304   GFRNONAA 51 (L) 11/04/2020 0304   GFRNONAA 51 (L) 11/04/2020 0304   GFRAA 26 (L) 04/21/2020 1300   CBC    Component Value Date/Time   WBC 8.5 11/04/2020 0304   RBC 3.45 (L) 11/04/2020 0304   HGB 9.4 (L) 11/04/2020 0304   HCT 29.6 (L) 11/04/2020 0304   PLT 82 (L) 11/04/2020 0304   MCV 85.8 11/04/2020 0304  MCH 27.2 11/04/2020 0304   MCHC 31.8 11/04/2020 0304   RDW 17.2 (H) 11/04/2020 0304   LYMPHSABS 1.1 11/04/2020 0304   MONOABS 0.9 11/04/2020 0304   EOSABS 0.2 11/04/2020 0304   BASOSABS 0.0 11/04/2020 0304     Assessment/Plan:  1. 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  1. Started on CRRT due to severe oliguric AKI on 10/30/20. 2. Dialysate and replacement fluids changed to 4K due to hypokalemia 3. Pre-filter 500 ml/hr, post filter 300 ml/hr, dialysate 2,000 ml/hr 4. Will increase UF goal to 50-100 ml/hr given lower ext edema and + 6 liters since admission. 5. Hopefully can transition to IHD soon if BP tolerates UF on CRRT.  2. Severe Septic shock from Enterococcus faecalis bacteremia- presumably urinary source. Abx per ID and primary team.  TEE  negative for vegetations. 3. 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 IR following. 1. Appreciateurologyinput and will retry placing left PCN in a few days or longer once hydro is present. 4. Left perinephric hematoma- continue to follow H/H and transfuse prn. 5. Thrombocytopenia- likely due to critical illness. Anticoagulation on hold. Follow platelets. 6. Hyperkalemia- improved with CRRT initiation on 10/30/20.  7. Hypophosphatemia- follow and replace as needed. 1. s/p 30 mmol Kphos 11/30 8. Acute hypoxemic respiratory failure- s/p intubation and currently on vent per PCCM. 9. AMS- EEG nonspecific.  Donetta Potts, MD [employer] [phone]

## 2020-11-04 NOTE — Progress Notes (Signed)
Supervising Physician: Dr. Ruthann Cancer  Patient Status:  Brendan [name] - In-pt  Chief Complaint: Septic shock  Subjective: Self extubated last pm Unsuccessful attempt at replacement of left PCN 11/27. Urology consulted, appreciate their input. CRRT appears to be helping Cr. Stable around 1.5 Hgb stable at 9.4  Allergies: Chlorhexidine and Other  Medications:  Current Facility-Administered Medications:  .   prismasol BGK 4/2.5 infusion, , CRRT, Continuous, Roney Jaffe, MD, Last Rate: 500 mL/hr at 11/03/20 2341, New Bag at 11/03/20 2341 .   prismasol BGK 4/2.5 infusion, , CRRT, Continuous, Roney Jaffe, MD, Last Rate: 300 mL/hr at 11/04/20 0633, New Bag at 11/04/20 [phone] .  0.9 %  sodium chloride infusion, 250 mL, Intravenous, Continuous, Kovacich, Amanda, MD, Last Rate: 10 mL/hr at 11/04/20 0900, Rate Verify at 11/04/20 0900 .  0.9 %  sodium chloride infusion, 250 mL, Intravenous, Continuous, Ripley Fraise, MD .  0.9 %  sodium chloride infusion, , Intravenous, Continuous, Reino Bellis B, NP .  ampicillin (OMNIPEN) 2 g in sodium chloride 0.9 % 100 mL IVPB, 2 g, Intravenous, Q6H, Freddi Starr, MD, Stopped at 11/04/20 [phone] .  B-complex with vitamin C tablet 1 tablet, 1 tablet, Per Tube, Daily, Freddi Starr, MD, 1 tablet at 11/03/20 1115 .  docusate (COLACE) 50 MG/5ML liquid 100 mg, 100 mg, Per Tube, BID, Candee Furbish, MD, 100 mg at 11/02/20 2144 .  feeding supplement (PROSource TF) liquid 90 mL, 90 mL, Per Tube, QID, Freddi Starr, MD, 90 mL at 11/03/20 1710 .  feeding supplement (VITAL 1.5 CAL) liquid 1,000 mL, 1,000 mL, Per Tube, Continuous, Freddi Starr, MD, Stopped at 11/03/20 1900 .  fentaNYL (SUBLIMAZE) injection 50 mcg, 50 mcg, Intravenous, Q1H PRN, Freda Jackson B, MD .  heparin injection 1,000-6,000 Units, 1,000-6,000 Units, CRRT, PRN, Reesa Chew, MD, 3,400 Units at 11/03/20 1212 .  hydrocortisone sodium succinate (SOLU-CORTEF) 100  MG injection 50 mg, 50 mg, Intravenous, Daily, Freddi Starr, MD, 50 mg at 11/04/20 0912 .  insulin aspart (novoLOG) injection 0-9 Units, 0-9 Units, Subcutaneous, Q4H, Freddi Starr, MD, 1 Units at 11/03/20 1651 .  insulin detemir (LEVEMIR) injection 10 Units, 10 Units, Subcutaneous, BID, Candee Furbish, MD, 10 Units at 11/04/20 0913 .  iohexol (OMNIPAQUE) 300 MG/ML solution 100 mL, 100 mL, Per Tube, Once PRN, Suttle, Dylan J, MD .  lidocaine (XYLOCAINE) 1 % (with pres) injection, , Infiltration, PRN, Suttle, Dylan J, MD, 5 mL at 10/30/20 1340 .  MEDLINE mouth rinse, 15 mL, Mouth Rinse, 10 times per day, Candee Furbish, MD, 15 mL at 11/04/20 0912 .  norepinephrine (LEVOPHED) 16 mg in 234mL premix infusion, 2-35 mcg/min, Intravenous, Titrated, Candee Furbish, MD, Stopped at 11/02/20 1045 .  pantoprazole (PROTONIX) injection 40 mg, 40 mg, Intravenous, QHS, Freddi Starr, MD, 40 mg at 11/03/20 2156 .  polyethylene glycol (MIRALAX / GLYCOLAX) packet 17 g, 17 g, Per Tube, Daily, Candee Furbish, MD, 17 g at 11/01/20 0915 .  prismasol BGK 4/2.5 infusion, , CRRT, Continuous, Roney Jaffe, MD, Last Rate: 2,000 mL/hr at 11/04/20 0732, New Bag at 11/04/20 0732 .  sodium chloride 0.9 % primer fluid for CRRT, 500 mL, CRRT, PRN, Reesa Chew, MD .  sodium chloride flush (NS) 0.9 % injection 10-40 mL, 10-40 mL, Intracatheter, Q12H, Freddi Starr, MD, 10 mL at 11/04/20 0917 .  sodium chloride flush (NS) 0.9 % injection 10-40 mL, 10-40 mL, Intracatheter, PRN,  Freddi Starr, MD    Vital Signs: BP (!) 119/107 (BP Location: Left Arm)   Pulse (!) 111   Temp 98.2 F (36.8 C)   Resp (!) 24   Ht 6\' 1"  (1.854 m)   Wt 122.3 kg   SpO2 100%   BMI 35.57 kg/m   Physical Exam  Extubated Abdomen: rotated on left side, procedure site down. No obvious issues with site.  GU: blood-tinged urine in foley.   Imaging: EEG  Result Date: 11/01/2020 Lora Havens, MD      11/01/2020 12:54 PM [name] MRN: [id number] Epilepsy Attending: Lora Havens Referring Physician/Provider: Dr. Ina Homes Date: 11/01/2020 Duration: 23.15 minutes Patient history: 59 year old male with altered mental status and questionable seizure activity.  EEG to evaluate for seizures. Level of alertness: Comatose AEDs during EEG study: None Technical aspects: This EEG study was done with scalp electrodes positioned according to the 10-20 International system of electrode placement. Electrical activity was acquired at a sampling rate of 500Hz  and reviewed with a high frequency filter of 70Hz  and a low frequency filter of 1Hz . EEG data were recorded continuously and digitally stored. Description: EEG showed continuous generalized 6 to 7 Hz theta slowing as well as intermittent generalized 2 to 3 Hz delta slowing.  Sharp transients were seen in vertex region.  Hyperventilation and photic stimulation were not performed.   ABNORMALITY -Continuous slow, generalized IMPRESSION: This study is suggestive of severe diffuse encephalopathy, nonspecific etiology.  No seizures or definite epileptiform discharges were seen throughout the recording. 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 Please assess left-sided perinephric hematoma as well as for development of left-sided hydronephrosis. EXAM: CT ABDOMEN AND PELVIS WITHOUT CONTRAST TECHNIQUE: Multidetector CT imaging of the abdomen and pelvis was performed following the standard protocol without IV contrast. COMPARISON:  CT abdomen and pelvis-10/31/2020; 10/30/2020  Attempted fluoroscopic and CT guided left-sided nephrostomy catheter replacement/placement-10/30/2020 Fluoroscopic guided left-sided nephrostomy catheter exchange-08/19/2020 FINDINGS: Lower chest: Limited visualization of lower thorax demonstrates interval increase in size of trace/small bilateral effusions and associated worsening bibasilar consolidative opacities. Scattered air bronchograms are seen within the left lower lobe. Borderline cardiomegaly. Diffuse decreased attenuation of the intra cardiac blood pool suggestive of anemia. Coronary artery calcifications. No pericardial effusion. Hepatobiliary: Normal hepatic contour. Normal noncontrast appearance of the gallbladder given degree distention. No radiopaque gallstones. No ascites. Pancreas: Normal noncontrast appearance of the pancreas. Spleen: Normal noncontrast appearance of the spleen. Adrenals/Urinary Tract: While challenging to accurately measure, the left-sided perinephric hematoma is grossly unchanged compared to CT scan performed before attempted left-sided nephrostomy catheter replacement/placement, currently measuring 3.8 x 9.7 cm (axial image 43, series 3; coronal image 87, series 6), previously, 4.6 x 9.9 cm. There is a small amount of air again seen within the perinephric hematoma likely the sequela of attempted replacement/placement. The amount of blood within the dependent portion of pericolic gutter has subjectively reduced in the interval (represent image 41, series 3). There has been no development of left-sided hydronephrosis suggestive of little to no functionality of the left kidney versus incomplete occlusion of the ureter. The right kidney remains markedly atrophic again without evidence of right-sided urinary obstruction. Normal  noncontrast appearance the bilateral adrenal glands. Normal appearance of the urinary bladder given underdistention. Stomach/Bowel: Appropriately position rectal tube. Unremarkable colonic stool burden. No  evidence of enteric obstruction. Normal noncontrast appearance of the terminal ileum and appendix. A minimal amount of residual contrast is seen within the tip of the appendix. No pneumoperitoneum, pneumatosis or portal venous gas. Vascular/Lymphatic: Redemonstrated aneurysmal dilatation of the infrarenal abdominal aorta measuring approximately 3.2 cm in diameter (image 59, series 3). Redemonstrated second parental soft tissue about the aorta and retroperitoneum compatible with provided history of retroperitoneal fibrosis, grossly unchanged. Reproductive: Normal appearance the prostate gland. No free fluid the pelvic cul-de-sac. Other: Diffuse body wall anasarca. Multiple subcutaneous nodules are identified about the undersurface of the abdominal pannus, likely at the location of subcutaneous medication administration. Musculoskeletal: No acute or aggressive osseous abnormalities. 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 IMPRESSION: 1. Unchanged left-sided perinephric hematoma compared to CT scan performed prior to attempted left-sided nephrostomy catheter replacement/placement with interval reduction in the amount of blood products within the dependent portion of the left pararenal space. 2. No development of left-sided pelvicaliectasis despite lack of left-sided nephrostomy catheter - this finding suggests either lack of functionality left kidney or in complete occlusion of the left ureter. 3. Chronic atrophy of the right kidney, again without evidence of urinary obstruction. 4. Similar findings of retroperitoneal fibrosis. 5. Appropriate positioned rectal tube without evidence of enteric obstruction. 6. Redemonstrated abdominal aortic aneurysm measuring 3.2 cm in diameter. 3.0 - 3.4 cm Abdominal Aortic Aneurysm (ICD10-I71.9). Recommend follow-up  aortic ultrasound in 3 years. This recommendation follows ACR consensus guidelines: White Paper of the ACR Incidental Findings Committee II on Vascular Findings. J Am Coll Radiol 2013; 10:789-794. 7. Aortic Atherosclerosis (ICD10-I70.0). Electronically Signed   By: Sandi Mariscal M.D.   On: 11/04/2020 08:31   CT ABDOMEN PELVIS WO CONTRAST  Result Date: 10/31/2020 CLINICAL DATA:  Follow-up left perinephric hematoma. EXAM: CT ABDOMEN AND PELVIS WITHOUT CONTRAST TECHNIQUE: Multidetector CT imaging of the abdomen and pelvis was performed following the standard protocol without IV contrast. COMPARISON:  10/30/2020 FINDINGS: Lower chest: Small bilateral pleural effusions with overlying subsegmental atelectasis. Hepatobiliary: No focal liver abnormality identified. Gallbladder unremarkable. No signs of biliary ductal dilatation Pancreas: Unremarkable. No pancreatic ductal dilatation or surrounding inflammatory changes. Spleen: Normal in size without focal abnormality. Adrenals/Urinary Tract: Normal appearance of the adrenal glands. Asymmetric atrophy of the right kidney is again noted. Excreted contrast material is identified within the right renal collecting system. Left kidney subcapsular hematoma is again noted. This measures 10.7 x 7.1 cm, image 60/6. Previously 10.4 x 6.6 cm. There is surrounding perinephric hemorrhage with signs of mildly progressive hemoperitoneum. Collection of high attenuation material anterior to the mid right kidney is identified measuring 4.6 x 2.7 cm, image 42/3 and likely represents extravasated contrast material from attempted percutaneous nephrostomy tube placement performed yesterday. Concentrated IV contrast material noted within the urinary bladder. Stomach/Bowel: There is an indwelling nasogastric tube with tip in the distal stomach. High contrast material noted within the stomach and proximal duodenum. No dilated loops of large or small bowel. Vascular/Lymphatic: Aortic  atherosclerosis. 3.5 cm infrarenal abdominal aortic aneurysm is again noted. 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 Reproductive: Prostate is unremarkable. Other: High attenuation peritoneal fluid consistent with hemoperitoneum. This appears increased  in volume when compared with 10/30/2020. Musculoskeletal: No acute or significant osseous findings. IMPRESSION: 1. Mild increase in size of left kidney subcapsular hematoma with signs of progressive hemoperitoneum. Hemoperitoneum is again noted and is also mildly increased in volume from previous study. 2. Collection of high attenuation material anterior to the mid right kidney likely represents extravasated contrast material. 3. Signs of retroperitoneal fibrosis with increased retroperitoneal soft tissue surrounding the abdominal aorta and encasing bilateral ureters. 4. Small bilateral pleural effusions with overlying subsegmental atelectasis. 5. 3.5 cm infrarenal abdominal aortic aneurysm. Aortic aneurysm NOS (ICD10-I71.9). 6. Aortic atherosclerosis. Aortic Atherosclerosis (ICD10-I70.0). Critical Value/emergent results were called by telephone at the time of interpretation on 10/31/2020 at 10:17 am to provider [facility] , who verbally acknowledged these results. Electronically Signed   By: Kerby Moors M.D.   On: 10/31/2020 10:18   CT HEAD WO CONTRAST  Result Date: 10/31/2020 CLINICAL DATA:  Cerebral hemorrhage. EXAM: CT HEAD WITHOUT CONTRAST TECHNIQUE: Contiguous axial images were obtained from the base of the skull through the vertex without intravenous contrast. COMPARISON:  CT head November 27 21. FINDINGS: Brain: Postsurgical changes of left left-sided craniotomy. Similar size of an underlying subdural hemorrhage, which measures up to 1.4 cm in thickness. Single locule within this collection, unchanged. Similar associated mass  effect without substantial midline shift. Similar appearance of encephalomalacia within the left frontal lobe white matter. No hydrocephalus. No evidence of new/acute large vascular territory infarct. Vascular: Calcific atherosclerosis. Skull: Left-sided craniotomy. Sinuses/Orbits: Similar complete opacification of bilateral maxillary sinuses with mineralization in the left maxillary sinus. Maxillary sinus opacification is mildly expansile with surrounding bony thickening, suggesting chronicity. Mucosal thickening of ethmoid air cells and sphenoid sinuses. Other: No mastoid effusions. IMPRESSION: 1. No substantial change in left left frontal craniotomy with subjacent 1.4 cm thick underlying subdural hematoma. Similar so shaded mass effect. 2. Left frontal e white matter encephalomalacia. 3. Similar findings of chronic bilateral maxillary sinusitis, as detailed above. Electronically Signed   By: Margaretha Sheffield MD   On: 10/31/2020 09:51   ECHOCARDIOGRAM COMPLETE  Result Date: 10/31/2020    ECHOCARDIOGRAM REPORT   Patient Name:   Brendan [name]  Date of Exam: 10/31/2020 Medical Rec #:  [id number]  Height:       73.0 in Accession #:    [id number] Weight:       276.7 lb Date of Birth:  1961-01-02 BSA:          2.470 m Patient Age:    58 years   BP:           116/73 mmHg Patient Gender: M          HR:           77 bpm. Exam Location:  Inpatient Procedure: 2D Echo, Cardiac Doppler and Color Doppler Indications:    Endocarditis I38  History:        Patient has no prior history of Echocardiogram examinations.                 Risk Factors:Hypertension and Dyslipidemia. Septic shock,AKI                 (acute kidney injury),Right leg DVT.  Sonographer:    Alvino Chapel RCS Referring Phys: [id number] Candee Furbish  Sonographer Comments: Patient on continuous dialysis and mechancial ventilator during echo. IMPRESSIONS  1. Left ventricular ejection fraction, by estimation, is 65 to 70%. The left ventricle has normal function.  The left ventricle has no regional  wall motion abnormalities. Left ventricular diastolic parameters are consistent with Grade I diastolic dysfunction (impaired relaxation).  2. Right ventricular systolic function is normal. The right ventricular size is normal.  3. The mitral valve is abnormal. Trivial mitral valve regurgitation.  4. Possible aortic valve vegetation is visualized on the noncoronary cusp vs sclerosis and motion artifact. There is no associated aortic insufficiency. Further imaging with TEE is recommended.  5. Possible small aortic valve mass on the noncoronary cusp which is echogenic.  6. The aortic valve is tricuspid. Aortic valve regurgitation is not visualized. Conclusion(s)/Recommendation(s): Possible valvular vegetation noted on the non-coronary cusp of the aortic valve on this transthoracic echocardiogram. Would recommend a transesophageal echocardiogram to exclude infective endocarditis if clinically indicated. FINDINGS  Left Ventricle: Left ventricular ejection fraction, by estimation, is 65 to 70%. The left ventricle has normal function. The left ventricle has no regional wall motion abnormalities. The left ventricular internal cavity size was normal in size. There is  no left ventricular hypertrophy. Left ventricular diastolic parameters are consistent with Grade I diastolic dysfunction (impaired relaxation). Indeterminate filling pressures. Right Ventricle: The right ventricular size is normal. No increase in right ventricular wall thickness. Right ventricular systolic function is normal. Left Atrium: Left atrial size was normal in size. Right Atrium: Right atrial size was normal in size. Pericardium: There is no evidence of pericardial effusion. Mitral Valve: The mitral valve is abnormal. There is mild thickening of the mitral valve leaflet(s). Trivial mitral valve regurgitation. Tricuspid Valve: The tricuspid valve is grossly normal. Tricuspid valve regurgitation is trivial. Aortic  Valve: The aortic valve is tricuspid. Aortic valve regurgitation is not visualized. A vegetation is seen on the noncoronary. There is a small aortic valve mass seen on the noncoronary cusp. The AoV mass measures 5 mm x 5 mm. Pulmonic Valve: The pulmonic valve was grossly normal. Pulmonic valve regurgitation is trivial. Aorta: The aortic root and ascending aorta are structurally normal, with no evidence of dilitation. Venous: IVC assessment for right atrial pressure unable to be performed due to mechanical ventilation. IAS/Shunts: No atrial level shunt detected by color flow Doppler.  LEFT VENTRICLE PLAX 2D LVIDd:         4.60 cm  Diastology LVIDs:         2.50 cm  LV e' medial:    6.31 cm/s LV PW:         1.00 cm  LV E/e' medial:  8.7 LV IVS:        1.00 cm  LV e' lateral:   7.62 cm/s LVOT diam:     2.10 cm  LV E/e' lateral: 7.2 LV SV:         48 LV SV Index:   20 LVOT Area:     3.46 cm  RIGHT VENTRICLE RV S prime:     12.70 cm/s TAPSE (M-mode): 2.3 cm LEFT ATRIUM             Index       RIGHT ATRIUM           Index LA diam:        3.90 cm 1.58 cm/m  RA Area:     16.50 cm LA Vol (A2C):   38.5 ml 15.59 ml/m RA Volume:   42.10 ml  17.05 ml/m LA Vol (A4C):   24.9 ml 10.08 ml/m LA Biplane Vol: 31.7 ml 12.83 ml/m  AORTIC VALVE LVOT Vmax:   81.50 cm/s LVOT Vmean:  47.800 cm/s LVOT VTI:  0.140 m  AORTA Ao Root diam: 3.70 cm MITRAL VALVE               TRICUSPID VALVE MV Area (PHT): 3.17 cm    TR Peak grad:   13.5 mmHg MV Decel Time: 239 msec    TR Vmax:        184.00 cm/s MV E velocity: 54.80 cm/s MV A velocity: 84.70 cm/s  SHUNTS MV E/A ratio:  0.65        Systemic VTI:  0.14 m                            Systemic Diam: 2.10 cm Lyman Bishop MD Electronically signed by Lyman Bishop MD Signature Date/Time: 10/31/2020/3:59:55 PM    Final    ECHO TEE  Result Date: 11/03/2020    TRANSESOPHOGEAL ECHO REPORT   Patient Name:   Brendan [name]  Date of Exam: 11/03/2020 Medical Rec #:  [id number]  Height:       73.0 in  Accession #:    [id number] Weight:       271.8 lb Date of Birth:  08/02/1961 BSA:          2.451 m Patient Age:    59 years   BP:           96/74 mmHg Patient Gender: M          HR:           102 bpm. Exam Location:  Inpatient Procedure: Transesophageal Echo Indications:    Bacteremia  History:        Patient has prior history of Echocardiogram examinations, most                 recent 10/31/2020. Risk Factors:Hypertension and Diabetes. H/O                 DVT. Stroke. CKD. GERD.  Sonographer:    Clayton Lefort RDCS (AE) Referring Phys: [id number] Freddi Starr PROCEDURE: After discussion of the risks and benefits of a TEE, an informed consent was obtained from a family member. The transesophogeal probe was passed without difficulty through the esophogus of the patient. Sedation performed by performing physician. The patient developed no complications during the procedure. Bedside TEE. IMPRESSIONS  1. Left ventricular ejection fraction, by estimation, is 60 to 65%. The left ventricle has normal function.  2. Right ventricular systolic function is normal. The right ventricular size is normal.  3. No left atrial/left atrial appendage thrombus was detected.  4. The mitral valve is normal in structure. Trivial mitral valve regurgitation.  5. No evidence of vegetation. The aortic valve is tricuspid. Aortic valve regurgitation is trivial. Conclusion(s)/Recommendation(s): No evidence of vegetation/infective endocarditis on this transesophageal echocardiogram. FINDINGS  Left Ventricle: Left ventricular ejection fraction, by estimation, is 60 to 65%. The left ventricle has normal function. The left ventricular internal cavity size was normal in size. Right Ventricle: The right ventricular size is normal. No increase in right ventricular wall thickness. Right ventricular systolic function is normal. Left Atrium: Left atrial size was normal in size. No left atrial/left atrial appendage thrombus was detected. Right Atrium: Right  atrial size was normal in size. Pericardium: There is no evidence of pericardial effusion. Mitral Valve: The mitral valve is normal in structure. Trivial mitral valve regurgitation. Tricuspid Valve: The tricuspid valve is normal in structure. Tricuspid valve regurgitation is not demonstrated. Aortic Valve: No evidence of vegetation. The aortic  valve is tricuspid. Aortic valve regurgitation is trivial. Pulmonic Valve: The pulmonic valve was normal in structure. Pulmonic valve regurgitation is trivial. Aorta: The aortic root is normal in size and structure. IAS/Shunts: No atrial level shunt detected by color flow Doppler. Candee Furbish MD Electronically signed by Candee Furbish MD Signature Date/Time: 11/03/2020/11:11:07 AM    Final     Labs:  CBC: Recent Labs    11/01/20 0445 11/01/20 0554 11/02/20 0310 11/02/20 0310 11/02/20 0312 11/02/20 1150 11/03/20 0505 11/04/20 0304  WBC 18.5*  --  16.0*  --   --   --  10.7* 8.5  HGB 7.4*   < > 8.9*   < > 8.5* 8.8* 9.3* 9.4*  HCT 23.0*   < > 28.1*   < > 25.0* 28.1* 29.8* 29.6*  PLT 74*  --  67*  --   --   --  68* 82*   < > = values in this interval not displayed.    COAGS: Recent Labs    01/23/20 0235 01/23/20 0235 10/30/20 0009 10/30/20 1038 10/31/20 0745 11/02/20 0813  INR 1.2   < > 1.1 1.6* 1.9* 1.2  APTT 40*  --  32 39* 46*  --    < > = values in this interval not displayed.    BMP: Recent Labs    02/10/20 0500 02/10/20 0500 02/11/20 0536 02/11/20 0536 02/12/20 0358 02/12/20 0358 04/21/20 1300 04/21/20 1325 11/02/20 1544 11/03/20 0505 11/03/20 1543 11/04/20 0304  NA 135   < > 135   < > 136   < > 139   < > 135 138 137 137  138  K 4.0   < > 4.4   < > 4.8   < > 4.5   < > 3.4* 3.4* 4.1 3.8  3.8  CL 99   < > 100   < > 102   < > 105   < > 100 102 104 101  103  CO2 23   < > 24   < > 22   < > 24   < > 23 23 22 24  24   GLUCOSE 151*   < > 165*   < > 184*   < > 96   < > 204* 109* 206* 108*  108*  BUN 50*   < > 52*   < > 49*   < >  52*   < > 24* 26* 27* 26*  27*  CALCIUM 8.9   < > 8.9   < > 9.2   < > 9.2   < > 7.6* 8.0* 7.7* 8.1*  8.1*  CREATININE 2.33*   < > 2.29*   < > 2.16*   < > 2.93*   < > 1.68* 1.50* 1.48* 1.54*  1.53*  GFRNONAA 29*   < > 30*   < > 32*   < > 22*   < > 46* 53* 53* 51*  51*  GFRAA 34*  --  35*  --  37*  --  26*  --   --   --   --   --    < > = values in this interval not displayed.    LIVER FUNCTION TESTS: Recent Labs    11/01/20 0445 11/01/20 1522 11/02/20 0310 11/02/20 0310 11/02/20 1544 11/03/20 0505 11/03/20 1543 11/04/20 0304  BILITOT 1.8*  --  0.7  --   --  0.5  --  0.7  AST 76*  --  50*  --   --  43*  --  57*  ALT 46*  --  40  --   --  37  --  47*  ALKPHOS 46  --  54  --   --  54  --  67  PROT 6.0*  --  6.2*  --   --  6.4*  --  6.6  ALBUMIN 2.9*   < > 2.6*   < > 2.6* 2.6* 2.5* 2.7*  2.7*   < > = values in this interval not displayed.    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 CT done yesterday shows unchanged hematoma with less blood products noted. No hydro noted. See full report. No immediate plans for attempt at new PCN at this time. IR following.   Electronically Signed: Ascencion Dike, PA-C 11/04/2020, 9:23 AM   I spent a total of 15 Minutes at the the patient's bedside AND on the patient's hospital floor or unit, greater than 50% of which was counseling/coordinating care for bacteremia, retroperitoneal fibrosis.

## 2020-11-04 NOTE — Progress Notes (Signed)
[name] for Infectious Disease  Date of Admission:  10/29/2020      Total days of antibiotics 7   Ampicillin 11/29 >>  Ceftriaxone 11/29 >> 12/01  Vancomycin 11/27 >> 11/27  Meropenem 11/27 >> 11/27          ASSESSMENT: Brendan [name] is a 59 y.o. male in ICU with retroperitoneal hematoma, obstructing L ureter and enterococcal faecalis bacteremia. TEE ruled out any endocarditis. Most likely from urinary source; given complex bleed/hematoma would treat for 4 weeks with Ampicillin. Likely his dialysis needs will be permanent at this point from nephrology's notes. He will eventually need a tunneled central line to receive his antibiotic as he nears closer to a discharge plan (home vs SNF/rehab).   He has improved significantly, hopeful he can get off CRRT and out of ICU soon.   Will arrange a follow up appointment for him at the end of therapy so we can check in with him.     PLAN: 1. OPAT as outlined below.  2. Please call with questions or changes in patient's condition    OPAT ORDERS:  Diagnosis: Complicated Bacteremia   Culture Result: Enterococcus Faecalis   Allergies  Allergen Reactions  . Chlorhexidine   . Other     TB serum-reaction unknown. Provided via [facility] records      Discharge antibiotics to be given via PICC line:  Per pharmacy protocol AMPICILLIN    Duration: 4 weeks   End Date: 11/27/2020  [facility] Care Per Protocol with [facility] Use: Home health RN for IV administration and teaching, line care and labs.    Labs weekly while on IV antibiotics: _x_ CBC with differential __ BMP _x_ CMP __ CRP __ ESR __ Vancomycin trough __ CK  __ Please pull PIC at completion of IV antibiotics _x_ Please leave PIC in place until doctor has seen patient or been notified  Fax weekly labs to [phone]  Clinic Follow Up Appt: 12/22 @ 2:15 pm with Dr. Juleen China     Principal Problem:   Enterococcal bacteremia Active Problems:   Septic  shock ([place])   AKI (acute kidney injury) ([place])   Retroperitoneal hematoma   . B-complex with vitamin C  1 tablet Per Tube Daily  . docusate  100 mg Per Tube BID  . insulin aspart  0-9 Units Subcutaneous Q4H  . insulin detemir  10 Units Subcutaneous BID  . ipratropium-albuterol  3 mL Nebulization Q6H  . mouth rinse  15 mL Mouth Rinse 10 times per day  . pantoprazole (PROTONIX) IV  40 mg Intravenous QHS  . polyethylene glycol  17 g Per Tube Daily  . sodium chloride flush  10-40 mL Intracatheter Q12H    SUBJECTIVE: Removed ETT earlier. Sore throat and hoarse voice are his only complaints presently.    Review of Systems: Review of Systems  Constitutional: Negative for chills and fever.  HENT: Positive for sore throat. Negative for tinnitus.   Eyes: Negative for blurred vision and photophobia.  Respiratory: Negative for cough and sputum production.   Cardiovascular: Negative for chest pain.  Gastrointestinal: Negative for diarrhea, nausea and vomiting.  Genitourinary: Negative for dysuria.  Skin: Negative for rash.  Neurological: Negative for headaches.    Allergies  Allergen Reactions  . Chlorhexidine   . Other     TB serum-reaction unknown. Provided via [facility] records     OBJECTIVE: Vitals:   11/04/20 1030 11/04/20 1100 11/04/20 1130 11/04/20 1200  BP: (!) 124/92 112/84 (!) 114/93 114/82  Pulse: (!) 102 (!) 106 (!) 113 (!) 109  Resp: $Remo'17 20 19 'pGpze$ (!) 22  Temp:      TempSrc:      SpO2: 100% 100% 100% 100%  Weight:      Height:       Body mass index is 35.57 kg/m.  Physical Exam Constitutional:      Comments: Sitting upright in bed. Comfortable. Quiet hoarse voice.   HENT:     Mouth/Throat:     Mouth: Mucous membranes are moist.     Pharynx: Oropharynx is clear.  Eyes:     General: No scleral icterus.    Pupils: Pupils are equal, round, and reactive to light.  Cardiovascular:     Rate and Rhythm: Regular rhythm. Tachycardia present.     Heart sounds: No murmur  heard.   Pulmonary:     Effort: Pulmonary effort is normal.     Breath sounds: Normal breath sounds.     Comments: Room air Abdominal:     General: There is distension.     Palpations: Abdomen is soft.  Musculoskeletal:        General: Normal range of motion.  Skin:    General: Skin is warm and dry.     Capillary Refill: Capillary refill takes less than 2 seconds.  Neurological:     Comments: Seems to understand and nodding appropriately to questions.      Lab Results Lab Results  Component Value Date   WBC 8.5 11/04/2020   HGB 9.4 (L) 11/04/2020   HCT 29.6 (L) 11/04/2020   MCV 85.8 11/04/2020   PLT 82 (L) 11/04/2020    Lab Results  Component Value Date   CREATININE 1.54 (H) 11/04/2020   CREATININE 1.53 (H) 11/04/2020   BUN 26 (H) 11/04/2020   BUN 27 (H) 11/04/2020   NA 137 11/04/2020   NA 138 11/04/2020   K 3.8 11/04/2020   K 3.8 11/04/2020   CL 101 11/04/2020   CL 103 11/04/2020   CO2 24 11/04/2020   CO2 24 11/04/2020    Lab Results  Component Value Date   ALT 47 (H) 11/04/2020   AST 57 (H) 11/04/2020   ALKPHOS 67 11/04/2020   BILITOT 0.7 11/04/2020     Microbiology: Recent Results (from the past 240 hour(s))  Culture, blood (Routine x 2)     Status: Abnormal   Collection Time: 10/30/20 12:09 AM   Specimen: BLOOD LEFT ARM  Result Value Ref Range Status   Specimen Description   Final    BLOOD LEFT ARM Performed at [facility], [address] [place], [place] [address]    Special Requests   Final    BOTTLES DRAWN AEROBIC AND ANAEROBIC Blood Culture adequate volume Performed at [facility], [address], [place], [place] [address]    Culture  Setup Time   Final    GRAM POSITIVE COCCI IN BOTH AEROBIC AND ANAEROBIC BOTTLES Gram Stain Report Called to,Read Back By and Verified With: SMITH MD @ 1619 ON [phone] BY HENDERSON L. Organism ID to follow CRITICAL RESULT CALLED TO, READ BACK BY AND VERIFIED WITH: PHRMD L SEAY $Remo'@0153'[email or web address]$  10/31/20 BY S GEZAHEGN     Culture (A)  Final    ENTEROCOCCUS FAECALIS SUSCEPTIBILITIES PERFORMED ON PREVIOUS CULTURE WITHIN THE LAST 5 DAYS. Performed at [facility], [place] [address], [place], [place] [address]    Report Status 11/02/2020 FINAL  Final  Blood Culture ID Panel (Reflexed)     Status: Abnormal   Collection Time: 10/30/20 12:09 AM  Result Value Ref Range Status   Enterococcus faecalis DETECTED (A) NOT DETECTED Final    Comment: CRITICAL RESULT CALLED TO, READ BACK BY AND VERIFIED WITH: PHRMD L SEAY $Remo'@0153'[email or web address]$  10/31/20 BY S GEZAHEGN    Enterococcus Faecium NOT DETECTED NOT DETECTED Final   Listeria monocytogenes NOT DETECTED NOT DETECTED Final   Staphylococcus species NOT DETECTED NOT DETECTED Final   Staphylococcus aureus (BCID) NOT DETECTED NOT DETECTED Final   Staphylococcus epidermidis NOT DETECTED NOT DETECTED Final   Staphylococcus lugdunensis NOT DETECTED NOT DETECTED Final   Streptococcus species NOT DETECTED NOT DETECTED Final   Streptococcus agalactiae NOT DETECTED NOT DETECTED Final   Streptococcus pneumoniae NOT DETECTED NOT DETECTED Final   Streptococcus pyogenes NOT DETECTED NOT DETECTED Final   A.calcoaceticus-baumannii NOT DETECTED NOT DETECTED Final   Bacteroides fragilis NOT DETECTED NOT DETECTED Final   Enterobacterales NOT DETECTED NOT DETECTED Final   Enterobacter cloacae complex NOT DETECTED NOT DETECTED Final   Escherichia coli NOT DETECTED NOT DETECTED Final   Klebsiella aerogenes NOT DETECTED NOT DETECTED Final   Klebsiella oxytoca NOT DETECTED NOT DETECTED Final   Klebsiella pneumoniae NOT DETECTED NOT DETECTED Final   Proteus species NOT DETECTED NOT DETECTED Final   Salmonella species NOT DETECTED NOT DETECTED Final   Serratia marcescens NOT DETECTED NOT DETECTED Final   Haemophilus influenzae NOT DETECTED NOT DETECTED Final   Neisseria meningitidis NOT DETECTED NOT DETECTED Final   Pseudomonas aeruginosa NOT DETECTED NOT DETECTED Final   Stenotrophomonas  maltophilia NOT DETECTED NOT DETECTED Final   Candida albicans NOT DETECTED NOT DETECTED Final   Candida auris NOT DETECTED NOT DETECTED Final   Candida glabrata NOT DETECTED NOT DETECTED Final   Candida krusei NOT DETECTED NOT DETECTED Final   Candida parapsilosis NOT DETECTED NOT DETECTED Final   Candida tropicalis NOT DETECTED NOT DETECTED Final   Cryptococcus neoformans/gattii NOT DETECTED NOT DETECTED Final   Vancomycin resistance NOT DETECTED NOT DETECTED Final    Comment: Performed at [facility], [address], [place], [place] [address]  Culture, blood (Routine x 2)     Status: Abnormal   Collection Time: 10/30/20  1:04 AM   Specimen: BLOOD LEFT ARM  Result Value Ref Range Status   Specimen Description   Final    BLOOD LEFT ARM Performed at [facility], [address], [place], [place] [address]    Special Requests   Final    BOTTLES DRAWN AEROBIC AND ANAEROBIC Blood Culture adequate volume Performed at [facility], [address], [place], [place] [address]    Culture  Setup Time   Final    GRAM POSITIVE COCCI Gram Stain Report Called to,Read Back By and Verified With: DIAZ,A ON 10/30/20 AT 2210 BY LOY,C AEROBIC BOTTLE ONLY Performed at [facility] Performed at [facility], [place] [address], [place], [place] [address]    Culture ENTEROCOCCUS FAECALIS (A)  Final   Report Status 11/02/2020 FINAL  Final   Organism ID, Bacteria ENTEROCOCCUS FAECALIS  Final      Susceptibility   Enterococcus faecalis - MIC*    AMPICILLIN <=2 SENSITIVE Sensitive     VANCOMYCIN 1 SENSITIVE Sensitive     GENTAMICIN SYNERGY SENSITIVE Sensitive     * ENTEROCOCCUS FAECALIS  Resp Panel by RT-PCR (Flu A&B, Covid) Nasopharyngeal Swab     Status: None   Collection Time:  10/30/20  1:14 AM   Specimen: Nasopharyngeal Swab; Nasopharyngeal(NP) swabs in vial transport medium  Result Value Ref Range Status   SARS Coronavirus 2 by RT PCR NEGATIVE NEGATIVE Final    Comment:  (NOTE) SARS-CoV-2 target nucleic acids are NOT DETECTED.  The SARS-CoV-2 RNA is generally detectable in upper respiratory specimens during the acute phase of infection. The lowest concentration of SARS-CoV-2 viral copies this assay can detect is 138 copies/mL. A negative result does not preclude SARS-Cov-2 infection and should not be used as the sole basis for treatment or other patient management decisions. A negative result may occur with  improper specimen collection/handling, submission of specimen other than nasopharyngeal swab, presence of viral mutation(s) within the areas targeted by this assay, and inadequate number of viral copies(<138 copies/mL). A negative result must be combined with clinical observations, patient history, and epidemiological information. The expected result is Negative.  Fact Sheet for Patients:  EntrepreneurPulse.com.au  Fact Sheet for Healthcare Providers:  IncredibleEmployment.be  This test is no t yet approved or cleared by the [place] FDA and  has been authorized for detection and/or diagnosis of SARS-CoV-2 by FDA under an Emergency Use Authorization (EUA). This EUA will remain  in effect (meaning this test can be used) for the duration of the COVID-19 declaration under Section 564(b)(1) of the Act, 21 U.S.C.section 360bbb-3(b)(1), unless the authorization is terminated  or revoked sooner.       Influenza A by PCR NEGATIVE NEGATIVE Final   Influenza B by PCR NEGATIVE NEGATIVE Final    Comment: (NOTE) The Xpert Xpress SARS-CoV-2/FLU/RSV plus assay is intended as an aid in the diagnosis of influenza from Nasopharyngeal swab specimens and should not be used as a sole basis for treatment. Nasal washings and aspirates are unacceptable for Xpert Xpress SARS-CoV-2/FLU/RSV testing.  Fact Sheet for Patients: EntrepreneurPulse.com.au  Fact Sheet for Healthcare  Providers: IncredibleEmployment.be  This test is not yet approved or cleared by the [place] FDA and has been authorized for detection and/or diagnosis of SARS-CoV-2 by FDA under an Emergency Use Authorization (EUA). This EUA will remain in effect (meaning this test can be used) for the duration of the COVID-19 declaration under Section 564(b)(1) of the Act, 21 U.S.C. section 360bbb-3(b)(1), unless the authorization is terminated or revoked.  Performed at [facility], [address], [place], [place] [address]   Urine culture     Status: Abnormal   Collection Time: 10/30/20  4:29 AM   Specimen: Urine, Clean Catch  Result Value Ref Range Status   Specimen Description   Final    URINE, CLEAN CATCH Performed at [facility], [address] [place], [place] [address]    Special Requests   Final    NONE Performed at [facility], [address] [place], [place] [address]    Culture MULTIPLE SPECIES PRESENT, SUGGEST RECOLLECTION (A)  Final   Report Status 11/02/2020 FINAL  Final  MRSA PCR Screening     Status: None   Collection Time: 10/30/20  9:30 AM   Specimen: Nasal Mucosa; Nasopharyngeal  Result Value Ref Range Status   MRSA by PCR NEGATIVE NEGATIVE Final    Comment:        The GeneXpert MRSA Assay (FDA approved for NASAL specimens only), is one component of a comprehensive MRSA colonization surveillance program. It is not intended to diagnose MRSA infection nor to guide or monitor treatment for MRSA infections. Performed at [facility], [place] [address], [place], [place] [address]  Culture, blood (x 2)     Status: None   Collection Time: 10/30/20 10:35 AM   Specimen: BLOOD RIGHT HAND  Result Value Ref Range Status   Specimen Description BLOOD RIGHT HAND  Final   Special Requests   Final    BOTTLES DRAWN AEROBIC ONLY Blood Culture results may not be optimal due to an inadequate volume of blood received in culture bottles   Culture   Final     NO GROWTH 5 DAYS Performed at [facility], [place] [address] [place], [place] [address]    Report Status 11/04/2020 FINAL  Final  Culture, blood (x 2)     Status: None   Collection Time: 10/30/20 10:50 AM   Specimen: BLOOD RIGHT HAND  Result Value Ref Range Status   Specimen Description BLOOD RIGHT HAND  Final   Special Requests   Final    BOTTLES DRAWN AEROBIC ONLY Blood Culture results may not be optimal due to an inadequate volume of blood received in culture bottles   Culture   Final    NO GROWTH 5 DAYS Performed at [facility], [place] [address], [place], [place] [address]    Report Status 11/04/2020 FINAL  Final  Fungus culture, blood     Status: None (Preliminary result)   Collection Time: 10/30/20 10:50 AM   Specimen: BLOOD RIGHT HAND  Result Value Ref Range Status   Specimen Description BLOOD RIGHT HAND  Final   Special Requests   Final    BOTTLES DRAWN AEROBIC ONLY Blood Culture results may not be optimal due to an inadequate volume of blood received in culture bottles   Culture   Final    NO GROWTH 5 DAYS Performed at [facility], [place] [address], [place], [place] [address]    Report Status PENDING  Incomplete     Janene Madeira, MSN, NP-C [facility] Infectious Disease [email or web address] Pager: [phone] Office: [phone] [place]: [phone]

## 2020-11-04 NOTE — Progress Notes (Signed)
Inpatient Diabetes Program Recommendations  AACE/ADA: New Consensus Statement on Inpatient Glycemic Control (2015)  Target Ranges:  Prepandial:   less than 140 mg/dL      Peak postprandial:   less than 180 mg/dL (1-2 hours)      Critically ill patients:  140 - 180 mg/dL   Lab Results  Component Value Date   GLUCAP 106 (H) 11/04/2020   HGBA1C 5.0 01/24/2020    Review of Glycemic Control Results for Brendan [name], Brendan [name] (MRN [id number]) as of 11/04/2020 13:02  Ref. Range 11/03/2020 23:05 11/03/2020 23:35 11/04/2020 03:03 11/04/2020 08:04 11/04/2020 12:07  Glucose-Capillary Latest Ref Range: 70 - 99 mg/dL 63 (L) 105 (H) 92 91 106 (H)   Diabetes history: None Current orders for Inpatient glycemic control:  Levemir 10 units bid Solucortef 50 mg daily Novolog sensitive q 4 hours Inpatient Diabetes Program Recommendations:  Note patient only received one dose of Levemir on 11/03/20.  Please consider reducing Levemir to 5 units daily.   Thanks  Adah Perl, RN, BC-ADM Inpatient Diabetes Coordinator Pager [phone] (8a-5p)

## 2020-11-04 NOTE — Progress Notes (Signed)
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 He presented to OSH ED with nausea, vomiting, fever and blood in his nephrostomy tube that began the day of admission. Imaging in the ED found a renal hematoma.  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 He presented to OSH ED with nausea, vomiting, fever and blood in his nephrostomy tube that began the day of admission.   In the ED, code sepsis was called due to concern for infection, fluids and cefepime were given. CT abdomen pelvis revealed shallow location of left percutaneous nephrostomy tube suggesting that it has withdrawn from the renal pelvis and a prominent left renal hematoma. He was tachycardic to the 160s and febrile to 104. CXR was unrevealing.   Per ED documentation, urology recommended admission to intensive care and reversal of anticoagulation. 1 dose of protamine 50mg  was administered to reverse the nursing home reported anticoagulation (per ED documentation, his last dose of heparin subcutaneous 5000 units was at 2200 on November 26th).   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 IVC filter not able to be placed due to extensive DVT -Subdural hematoma, s/p craniotomy 01/26/2020  Significant Hospital Events   Presented 10/30/20 to OSH ED, found to have renal hematoma and concern for sepsis  Consults:  Urology Nephrology Interventional Radiology Infectious Disease  Procedures:  NA  Significant Diagnostic Tests:   10/30/20 CT Abdomen Pelvis WO contrast: 1. Left percutaneous nephrostomy tube present, shallow location within the kidney suggesting that it has withdrawn from the renal pelvis 2. Prominent left renal hematoma with extensive stranding and hematoma in the pararenal spaces and apparently extending into the mesentery 3. Periaortic retroperitoneal stranding and soft tissue prominence suggesting retroperitoneal fibrosis, confluent lymphadenopathy, or aortitis. Appearances are similar to the prior study  10/31/20 CT Head 1. No substantial change in left left frontal craniotomy with subjacent 1.4 cm thick underlying subdural hematoma. Similar so shaded mass effect. 2. Left frontal e white matter encephalomalacia. 3. Similar findings of chronic bilateral maxillary sinusitis, as detailed above.  10/31/20 CT Abdomen/Pelvis 1. Mild increase in size of left kidney subcapsular hematoma with signs of progressive hemoperitoneum. Hemoperitoneum is again noted and is also mildly increased in volume from previous study. 2. Collection of high attenuation material anterior to the mid right kidney likely represents extravasated contrast material. 3. Signs of retroperitoneal fibrosis with increased retroperitoneal soft tissue surrounding the abdominal aorta and encasing bilateral ureters. 4. Small bilateral pleural effusions with overlying subsegmental atelectasis. 5. 3.5 cm infrarenal abdominal aortic  aneurysm. Aortic aneurysm NOS (ICD10-I71.9). 6. Aortic atherosclerosis.  10/31/2020 ECHO 1. Left ventricular ejection fraction, by estimation, is  65 to 70%. The  left ventricle has normal function. The left ventricle has no regional  wall motion abnormalities. Left ventricular diastolic parameters are  consistent with Grade I diastolic  dysfunction (impaired relaxation).  2. Right ventricular systolic function is normal. The right ventricular  size is normal.  3. The mitral valve is abnormal. Trivial mitral valve regurgitation.  4. Possible aortic valve vegetation is visualized on the noncoronary cusp  vs sclerosis and motion artifact. There is no associated aortic  insufficiency. Further imaging with TEE is recommended.  5. Possible small aortic valve mass on the noncoronary cusp which is  echogenic.  6. The aortic valve is tricuspid. Aortic valve regurgitation is not  Visualized.  Micro Data:   10/30/20 Blood culture- E faecalis Urine culture- No growth  Antimicrobials:  11/26 Cefepime 11/27 Ciprofloxacin 11/27 Meropenem 11/27 Vancomycin 11/28 Zosyn  11/29 Ceftriaxone + Ampicillin>>  Interim history/subjective:   Remains on CRRT. Patient self extubated last night and doing well on [place]. Has productive cough. No complaints at this time.  TEE yesterday negative for valvular vegetations.  Objective   Blood pressure 115/76, pulse (!) 101, temperature 98.2 F (36.8 C), resp. rate 17, height 6\' 1"  (1.854 m), weight 122.3 kg, SpO2 100 %.    Vent Mode: PRVC FiO2 (%):  [30 %] 30 % Set Rate:  [14 bmp] 14 bmp Vt Set:  [580 mL] 580 mL PEEP:  [5 cmH20] 5 cmH20 Plateau Pressure:  [10 cmH20] 10 cmH20   Intake/Output Summary (Last 24 hours) at 11/04/2020 1416 Last data filed at 11/04/2020 1400 Gross per 24 hour  Intake 966.69 ml  Output 2749 ml  Net -1782.31 ml   Filed Weights   11/02/20 0500 11/03/20 0600 11/04/20 0418  Weight: 126.3 kg 123.3 kg 122.3 kg     Examination: Constitutional: resting comfortably in bed, coughing Eyes: sclera anicteric Ears, nose, mouth, and throat: moist mucous membranes Cardiovascular: RRR, ext warm Respiratory: course breath sounds, scattered mild wheezing Gastrointestinal: soft, +BS Extremities: edema present Skin: No rashes, normal turgor Neurologic: moving all extremities, weak, PERRL  Resolved Hospital Problem list   NA  Assessment & Plan:  Brendan [name] is a 58 yo man with pmhx significant for 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 He presented to OSH ED with nausea, vomiting, fever and blood in his nephrostomy tube that began the day of admission. Imaging in the ED found a renal hematoma.  Severe septic shock secondary to E faecalis bacteremia  Suspected source is seeding from markedly abnormal peri-renal architecture.  Also baseline immunocompromised with cellcept. - ID Following, antibiotics changed to ceftriaxone + ampicillin on 11/29 - TEE performed 12/1. No evidence of valvular vegetations - Off pressors 12/1  Left perinephric hematoma from dislodged nephrostomy tube Underlying retroperitoneal fibrosis - IR following, unsuccessful attempt at replacing dislodge nephrostomy tube. - Repeat CT abdomen 12/1 showed stability and left renal capsular hematoma with no hydronephrosis. No plan for placement of left nephrostomy tube at this time per IR. - Urology consulted. Recommend continued monitoring of perinephric hematoma and patient will require left PCN placement at some point in future.  - Hold cellcept - Monitor H/H  Thrombocytopenia- related to acute illness - AC on hold - monitor daily - platelet count improving  Severe acute renal failure with hyperkalemia- started on CRRT 11/27. - Nephrology following - Net negative fluid balance goal  Acute hypoxemic respiratory failure- due to marked  acidemia, inability  to protect airway from metabolic disarray - Extubated 12/1 - Wean supplemental oxygen via [place] as able  Acute metabolic encephalopathy and question of associated seizure 11/27-28 - EEG negative for seizure activity, consistent with diffuse encephalopathy, non-specific.  - Versed discontinued 11/29 - slowly improving  Hyperglycemia - 5 units levemir BID - Sensitive sliding scale  Best practice (evaluated daily)   Diet: speech eval, soft diet for now Pain/Anxiety/Delirium protocol (if indicated): n/a VAP protocol (if indicated): n/a DVT prophylaxis: Hold in setting of renal hematoma, SCDs ordered GI prophylaxis: NA Glucose control: levemir, SSI Mobility: PT/OT when able last date of multidisciplinary goals of care discussion:  Family and staff present: phone, nurse + md Summary of discussion: continue aggressive care Follow up goals of care discussion due: Updated son, Brendan [name], on 12/2 about his father's condition Code Status: Full Disposition: ICU   I personally spent 40 minutes providing critical care not including any separately billable procedures   Freda Jackson, MD [place] Office: [phone]   See Amion for Pager Details

## 2020-11-04 NOTE — Progress Notes (Signed)
PHARMACY CONSULT NOTE FOR:  OUTPATIENT  PARENTERAL ANTIBIOTIC THERAPY (OPAT)  Indication: Enterococcal bacteremia Regimen: Ampicillin 2 gm IV q 12 hours  End date: 11/27/2020  Dosing for HD on discharge- If patient renal function changes, please update OPAT   IV antibiotic discharge orders are pended. To discharging provider:  please sign these orders via discharge navigator,  Select New Orders & click on the button choice - Manage This Unsigned Work.     Thank you for allowing pharmacy to be a part of this patient's care.  Jimmy Footman, PharmD, BCPS, BCIDP Infectious Diseases Clinical Pharmacist Phone: [phone] 11/04/2020, 1:01 PM

## 2020-11-05 DIAGNOSIS — R7881 Bacteremia: Secondary | ICD-10-CM | POA: Diagnosis not present

## 2020-11-05 DIAGNOSIS — K661 Hemoperitoneum: Secondary | ICD-10-CM | POA: Diagnosis not present

## 2020-11-05 DIAGNOSIS — N179 Acute kidney failure, unspecified: Secondary | ICD-10-CM | POA: Diagnosis not present

## 2020-11-05 DIAGNOSIS — A419 Sepsis, unspecified organism: Secondary | ICD-10-CM | POA: Diagnosis not present

## 2020-11-05 LAB — CBC WITH DIFFERENTIAL/PLATELET
Abs Immature Granulocytes: 0.21 10*3/uL — ABNORMAL HIGH (ref 0.00–0.07)
Basophils Absolute: 0 10*3/uL (ref 0.0–0.1)
Basophils Relative: 0 %
Eosinophils Absolute: 0.2 10*3/uL (ref 0.0–0.5)
Eosinophils Relative: 2 %
HCT: 29.9 % — ABNORMAL LOW (ref 39.0–52.0)
Hemoglobin: 9.5 g/dL — ABNORMAL LOW (ref 13.0–17.0)
Immature Granulocytes: 2 %
Lymphocytes Relative: 15 %
Lymphs Abs: 1.5 10*3/uL (ref 0.7–4.0)
MCH: 27.2 pg (ref 26.0–34.0)
MCHC: 31.8 g/dL (ref 30.0–36.0)
MCV: 85.7 fL (ref 80.0–100.0)
Monocytes Absolute: 1.4 10*3/uL — ABNORMAL HIGH (ref 0.1–1.0)
Monocytes Relative: 14 %
Neutro Abs: 6.3 10*3/uL (ref 1.7–7.7)
Neutrophils Relative %: 67 %
Platelets: 102 10*3/uL — ABNORMAL LOW (ref 150–400)
RBC: 3.49 MIL/uL — ABNORMAL LOW (ref 4.22–5.81)
RDW: 17.7 % — ABNORMAL HIGH (ref 11.5–15.5)
WBC: 9.7 10*3/uL (ref 4.0–10.5)
nRBC: 0.3 % — ABNORMAL HIGH (ref 0.0–0.2)

## 2020-11-05 LAB — RENAL FUNCTION PANEL
Albumin: 2.4 g/dL — ABNORMAL LOW (ref 3.5–5.0)
Anion gap: 13 (ref 5–15)
BUN: 31 mg/dL — ABNORMAL HIGH (ref 8–23)
CO2: 23 mmol/L (ref 22–32)
Calcium: 8 mg/dL — ABNORMAL LOW (ref 8.9–10.3)
Chloride: 100 mmol/L (ref 98–111)
Creatinine, Ser: 2.15 mg/dL — ABNORMAL HIGH (ref 0.61–1.24)
GFR, Estimated: 34 mL/min — ABNORMAL LOW (ref >60)
Glucose, Bld: 179 mg/dL — ABNORMAL HIGH (ref 70–99)
Phosphorus: 2.2 mg/dL — ABNORMAL LOW (ref 2.5–4.6)
Potassium: 3.7 mmol/L (ref 3.5–5.1)
Sodium: 136 mmol/L (ref 135–145)

## 2020-11-05 LAB — COMPREHENSIVE METABOLIC PANEL
ALT: 64 U/L — ABNORMAL HIGH (ref 0–44)
AST: 76 U/L — ABNORMAL HIGH (ref 15–41)
Albumin: 2.6 g/dL — ABNORMAL LOW (ref 3.5–5.0)
Alkaline Phosphatase: 85 U/L (ref 38–126)
Anion gap: 13 (ref 5–15)
BUN: 23 mg/dL (ref 8–23)
CO2: 24 mmol/L (ref 22–32)
Calcium: 8.2 mg/dL — ABNORMAL LOW (ref 8.9–10.3)
Chloride: 99 mmol/L (ref 98–111)
Creatinine, Ser: 1.63 mg/dL — ABNORMAL HIGH (ref 0.61–1.24)
GFR, Estimated: 48 mL/min — ABNORMAL LOW (ref >60)
Glucose, Bld: 96 mg/dL (ref 70–99)
Potassium: 3.8 mmol/L (ref 3.5–5.1)
Sodium: 136 mmol/L (ref 135–145)
Total Bilirubin: 1.4 mg/dL — ABNORMAL HIGH (ref 0.3–1.2)
Total Protein: 6.6 g/dL (ref 6.5–8.1)

## 2020-11-05 LAB — PHOSPHORUS: Phosphorus: 2.1 mg/dL — ABNORMAL LOW (ref 2.5–4.6)

## 2020-11-05 LAB — GLUCOSE, CAPILLARY
Glucose-Capillary: 74 mg/dL (ref 70–99)
Glucose-Capillary: 87 mg/dL (ref 70–99)
Glucose-Capillary: 146 mg/dL — ABNORMAL HIGH (ref 70–99)
Glucose-Capillary: 166 mg/dL — ABNORMAL HIGH (ref 70–99)
Glucose-Capillary: 170 mg/dL — ABNORMAL HIGH (ref 70–99)
Glucose-Capillary: 131 mg/dL — ABNORMAL HIGH (ref 70–99)

## 2020-11-05 LAB — MAGNESIUM: Magnesium: 2.5 mg/dL — ABNORMAL HIGH (ref 1.7–2.4)

## 2020-11-05 MED ORDER — SODIUM CHLORIDE 0.9 % IV SOLN
2.0000 g | Freq: Two times a day (BID) | INTRAVENOUS | Status: DC
  Administered 2020-11-05 – 2020-11-10 (×9): 2 g via INTRAVENOUS
  Filled 2020-11-05: qty 2000
  Filled 2020-11-05: qty 2
  Filled 2020-11-05 (×8): qty 2000
  Filled 2020-11-05: qty 2
  Filled 2020-11-05: qty 2000

## 2020-11-05 MED ORDER — IPRATROPIUM-ALBUTEROL 0.5-2.5 (3) MG/3ML IN SOLN: 3.0000 mL | RESPIRATORY_TRACT | Status: DC | PRN

## 2020-11-05 MED ORDER — SODIUM CHLORIDE 0.9 % IV SOLN
INTRAVENOUS | Status: DC | PRN
  Administered 2020-11-05: 250 mL via INTRAVENOUS

## 2020-11-05 MED ORDER — CHLORHEXIDINE GLUCONATE CLOTH 2 % EX PADS: 6.0000 | MEDICATED_PAD | Freq: Every day | CUTANEOUS | Status: DC

## 2020-11-05 MED ORDER — ENSURE ENLIVE PO LIQD
237.0000 mL | Freq: Three times a day (TID) | ORAL | Status: DC
  Administered 2020-11-05 – 2020-11-16 (×22): 237 mL via ORAL
  Filled 2020-11-05 (×3): qty 237

## 2020-11-05 NOTE — Progress Notes (Signed)
CSW attempted to reach Melissa in admissions at [facility] - a voicemail was left requesting a return call.  Madilyn Fireman, MSW, LCSW-A Transitions of Care  Clinical Social Worker I [facility]  Medical ICU [phone]

## 2020-11-05 NOTE — Progress Notes (Signed)
Nutrition Follow-up  DOCUMENTATION CODES:   Obesity unspecified  INTERVENTION:    Ensure Enlive po TID, each supplement provides 350 kcal and 20 grams of protein  Continue B-complex with vitamin C to supplement losses from CRRT  NUTRITION DIAGNOSIS:   Increased nutrient needs related to acute illness (acute renal failure on CRRT) as evidenced by estimated needs.  Ongoing   GOAL:   Patient will meet greater than or equal to 90% of their needs  Progressing  MONITOR:   PO intake, Supplement acceptance  REASON FOR ASSESSMENT:   Ventilator, Consult Enteral/tube feeding initiation and management  ASSESSMENT:   59 year old male who presented to the ED on 11/26 with emesis, fever, and blood in nephrostomy tube. 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 Pt required intubation in the ED to protect airway and was found to have prominent left renal hematoma with dislodged nephrostomy tube. Pt admitted with sepsis.  Patient self-extubated 12/1. S/P TEE 12/1: negative for valvular vegetations. Repeat CT abdomen 12/1: left renal capsular hematoma, no hydronephrosis, will require L PCN tube placement in the future. Remains on CRRT. Diet advanced to soft on 12/2, patient ate 50% of dinner. Will add PO supplements to help meet increased nutrient needs for CRRT.  Labs reviewed. Phos 2.1, mag 2.5 CBG: 74-87 this morning   Medications reviewed and include B-complex with vitamin C, Colace, Novolog, Levemir, Miralax.  Weight 120.6 kg today I/O +4 L since admission UOP 685 ml + 3 unmeasured occurances x 24 h CRRT pulled 3,045 ml x 24 h  Diet Order:   Diet Order            DIET SOFT Room service appropriate? Yes; Fluid consistency: Thin  Diet effective now                 EDUCATION NEEDS:   No education  needs have been identified at this time  Skin:  Skin Assessment: Reviewed RN Assessment  Last BM:  12/3 type 7 rectal tube  Height:   Ht Readings from Last 1 Encounters:  11/01/20 6\' 1"  (1.854 m)    Weight:   Wt Readings from Last 1 Encounters:  11/05/20 120.6 kg    Ideal Body Weight:  83.6 kg  BMI:  Body mass index is 35.08 kg/m.  Estimated Nutritional Needs:   Kcal:  2500-2800  Protein:  165-185 grams  Fluid:  2.0 L/day    Lucas Mallow, RD, LDN, CNSC Please refer to Amion for contact information.

## 2020-11-05 NOTE — Progress Notes (Signed)
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 He presented to OSH ED with nausea, vomiting, fever and blood in his nephrostomy tube that began the day of admission. Imaging in the ED found a renal hematoma.  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 He presented to OSH ED with nausea, vomiting, fever and blood in his nephrostomy tube that began the day of admission.   In the ED, code sepsis was called due to concern for infection, fluids and cefepime were given. CT abdomen pelvis revealed shallow location of left percutaneous nephrostomy tube suggesting that it has withdrawn from the renal pelvis and a prominent left renal hematoma. He was tachycardic to the 160s and febrile to 104. CXR was unrevealing.   Per ED documentation, urology recommended admission to intensive care and reversal of anticoagulation. 1 dose of protamine 50mg  was administered to reverse the nursing home reported anticoagulation (per ED documentation, his last dose of heparin subcutaneous 5000 units was at 2200 on November 26th).   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 IVC filter not able to be placed due to extensive DVT -Subdural hematoma, s/p craniotomy 01/26/2020  Significant Hospital Events   Presented 10/30/20 to OSH ED, found to have renal hematoma and concern for sepsis  Consults:  Urology Nephrology Interventional Radiology Infectious Disease  Procedures:  NA  Significant Diagnostic Tests:   10/30/20 CT Abdomen Pelvis WO contrast: 1. Left percutaneous nephrostomy tube present, shallow location within the kidney suggesting that it has withdrawn from the renal pelvis 2. Prominent left renal hematoma with extensive stranding and hematoma in the pararenal spaces and apparently extending into the mesentery 3. Periaortic retroperitoneal stranding and soft tissue prominence suggesting retroperitoneal fibrosis, confluent lymphadenopathy, or aortitis. Appearances are similar to the prior study  10/31/20 CT Head 1. No substantial change in left left frontal craniotomy with subjacent 1.4 cm thick underlying subdural hematoma. Similar so shaded mass effect. 2. Left frontal e white matter encephalomalacia. 3. Similar findings of chronic bilateral maxillary sinusitis, as detailed above.  10/31/20 CT Abdomen/Pelvis 1. Mild increase in size of left kidney subcapsular hematoma with signs of progressive hemoperitoneum. Hemoperitoneum is again noted and is also mildly increased in volume from previous study. 2. Collection of high attenuation material anterior to the mid right kidney likely represents extravasated contrast material. 3. Signs of retroperitoneal fibrosis with increased retroperitoneal soft tissue surrounding the abdominal aorta and encasing bilateral ureters. 4. Small bilateral pleural effusions with overlying subsegmental atelectasis. 5. 3.5 cm infrarenal abdominal aortic  aneurysm. Aortic aneurysm NOS (ICD10-I71.9). 6. Aortic atherosclerosis.  10/31/2020 ECHO 1. Left ventricular ejection fraction, by estimation, is  65 to 70%. The  left ventricle has normal function. The left ventricle has no regional  wall motion abnormalities. Left ventricular diastolic parameters are  consistent with Grade I diastolic  dysfunction (impaired relaxation).  2. Right ventricular systolic function is normal. The right ventricular  size is normal.  3. The mitral valve is abnormal. Trivial mitral valve regurgitation.  4. Possible aortic valve vegetation is visualized on the noncoronary cusp  vs sclerosis and motion artifact. There is no associated aortic  insufficiency. Further imaging with TEE is recommended.  5. Possible small aortic valve mass on the noncoronary cusp which is  echogenic.  6. The aortic valve is tricuspid. Aortic valve regurgitation is not  Visualized.  Micro Data:   10/30/20 Blood culture- E faecalis Urine culture- No growth  Antimicrobials:  11/26 Cefepime 11/27 Ciprofloxacin 11/27 Meropenem 11/27 Vancomycin 11/28 Zosyn  11/29 Ceftriaxone + Ampicillin>>  Interim history/subjective:   Patient remains critically ill on CVVHD.  Close observation following soft extubation.  Able to maintain on his own.  No significant issues overnight.  Objective   Blood pressure (!) 126/102, pulse 100, temperature (!) 97.5 F (36.4 C), temperature source Oral, resp. rate 18, height 6\' 1"  (1.854 m), weight 120.6 kg, SpO2 100 %.    FiO2 (%):  [21 %] 21 %   Intake/Output Summary (Last 24 hours) at 11/05/2020 0736 Last data filed at 11/05/2020 0700 Gross per 24 hour  Intake 1435.62 ml  Output 3730 ml  Net -2294.38 ml   Filed Weights   11/03/20 0600 11/04/20 0418 11/05/20 0500  Weight: 123.3 kg 122.3 kg 120.6 kg    Examination: Constitutional: Resting comfortably in bed, conversant Eyes: Tracking appropriately  ENT: Mucous membranes  dry Cardiovascular: Regular rate rhythm, S1-S2 Respiratory: Bilateral breath sounds, diminished in the bases port effort Gastrointestinal: Soft nontender nondistended Extremities: Dependent edema Skin: No rash Neurologic: Moves all 4 extremities  Resolved Hospital Problem list   NA  Assessment & Plan:  Mr. 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 He presented to OSH ED with nausea, vomiting, fever and blood in his nephrostomy tube that began the day of admission. Imaging in the ED found a renal hematoma.  Severe septic shock secondary to E faecalis bacteremia Likely urinary source from obstructive uropathy ID following with ampicillin plus ceftriaxone Restart vasopressors if needed to maintain mean arterial pressure than 65 mmHg  Left perinephric hematoma from dislodged nephrostomy tube Underlying retroperitoneal fibrosis - IR following, unsuccessful attempt at replacing dislodge nephrostomy tube. - Repeat CT abdomen 12/1 showed stability and left renal capsular hematoma with no hydronephrosis. No plan for placement of left nephrostomy tube at this time per IR. - Urology consulted. Recommend continued monitoring of perinephric hematoma and patient will require left PCN placement at some point in future.  Plan: Follow H&H Continue to hold CellCept  Thrombocytopenia- related to acute illness Plan: Continue to follow platelet count, hold AC  Severe acute renal failure with hyperkalemia- started on CRRT 11/27. Plan: Continue to pull for negative fluid balance on CVVHD as he does appear volume overloaded CVVHD per nephrology  Acute hypoxemic respiratory failure- due to marked acidemia, inability to protect airway from metabolic disarray Plan: Wean FiO2 as tolerated to maintain SPO2 greater than 90%.  Acute metabolic encephalopathy and  question of associated seizure 11/27-28 - EEG negative for seizure activity, consistent with diffuse encephalopathy, non-specific.  Plan: Observe  Hyperglycemia Plan: Levemir plus CBGs Goal CBG 140-180  Best practice (evaluated daily)   Diet: speech eval, soft diet for now Pain/Anxiety/Delirium protocol (if indicated): n/a VAP protocol (if indicated): n/a DVT prophylaxis: Hold in setting of renal hematoma, SCDs ordered GI prophylaxis: NA Glucose control: levemir, SSI Mobility: PT/OT when able last date of multidisciplinary goals of care discussion:  Family and staff present: phone, nurse + md Summary of discussion: continue aggressive care Follow up goals of care discussion due: We will update patient's family Code Status: Full Disposition: ICU    This patient is critically ill with multiple organ system failure; which, requires frequent high complexity decision making, assessment, support, evaluation, and titration of therapies. This was completed through the application of advanced monitoring technologies and extensive interpretation of multiple databases. During this encounter critical care time was devoted to patient care services described in this note for 32 minutes.  Garner Nash, DO [facility] 11/05/2020 7:37 AM

## 2020-11-05 NOTE — Progress Notes (Signed)
Patient ID: Brendan [name], male   DOB: Dec 15, 1960, 59 y.o.   MRN: [id number] S: Feels better, more awake and alert today O:BP (!) 127/104   Pulse (!) 107   Temp 98 F (36.7 C) (Oral)   Resp 19   Ht 6\' 1"  (1.854 m)   Wt 120.6 kg   SpO2 99%   BMI 35.08 kg/m   Intake/Output Summary (Last 24 hours) at 11/05/2020 1349 Last data filed at 11/05/2020 1300 Gross per 24 hour  Intake 1338.97 ml  Output 3533 ml  Net -2194.03 ml   Intake/Output: I/O last 3 completed shifts: In: 1748.4 [P.O.:740; I.V.:408.4; IV Piggyback:600] Out: 4401 [Urine:1035; UUVOZ:3664; Stool:200]  Intake/Output this shift:  Total I/O In: 60 [I.V.:60] Out: 422 [Other:422] Weight change: -1.7 kg Gen: fatigued, NAD CVS: tachy at 107 Resp: CTA Abd: benign Ext: trace pretibial and presacral edema  Recent Labs  Lab 10/30/20 1038 10/30/20 1110 10/31/20 0452 10/31/20 0722 11/01/20 0445 11/01/20 0554 11/02/20 0310 11/02/20 0310 11/02/20 0312 11/02/20 1544 11/03/20 0505 11/03/20 1543 11/04/20 0304 11/04/20 1634 11/05/20 0314  NA 140   < > 136   < > 136   < > 137   < > 139 135 138 137 137  138 140 136  K 4.8   < > 5.9*   < > 3.0*   < > 3.2*   < > 3.1* 3.4* 3.4* 4.1 3.8  3.8 4.3 3.8  CL 106   < > 100   < > 95*   < > 98  --   --  100 102 104 101  103 102 99  CO2 16*   < > 18*   < > 26   < > 25  --   --  23 23 22 24  24 24 24   GLUCOSE 122*   < > 260*   < > 183*   < > 285*  --   --  204* 109* 206* 108*  108* 124* 96  BUN 31*   < > 31*   < > 19   < > 24*  --   --  24* 26* 27* 26*  27* 22 23  CREATININE 4.57*   < > 4.36*   < > 2.11*   < > 1.86*  --   --  1.68* 1.50* 1.48* 1.54*  1.53* 1.47* 1.63*  ALBUMIN 2.1*   < > 2.9*   < > 2.9*   < > 2.6*  --   --  2.6* 2.6* 2.5* 2.7*  2.7* 2.6* 2.6*  CALCIUM 7.8*   < > 7.4*   < > 7.7*   < > 7.7*  --   --  7.6* 8.0* 7.7* 8.1*  8.1* 8.3* 8.2*  PHOS  --   --  3.0   < > 2.7   < > 1.7*  --   --  2.8 2.6 2.4* 2.6 2.5 2.1*  AST 55*  --  82*  --  76*  --  50*  --   --   --  43*   --  57*  --  76*  ALT 32  --  44  --  46*  --  40  --   --   --  37  --  47*  --  64*   < > = values in this interval not displayed.   Liver Function Tests: Recent Labs  Lab 11/03/20 0505 11/03/20 1543 11/04/20 0304 11/04/20 1634 11/05/20 0314  AST  43*  --  57*  --  76*  ALT 37  --  47*  --  64*  ALKPHOS 54  --  67  --  85  BILITOT 0.5  --  0.7  --  1.4*  PROT 6.4*  --  6.6  --  6.6  ALBUMIN 2.6*   < > 2.7*  2.7* 2.6* 2.6*   < > = values in this interval not displayed.   Recent Labs  Lab 10/30/20 0009  LIPASE 26   No results for input(s): AMMONIA in the last 168 hours. CBC: Recent Labs  Lab 11/01/20 0445 11/01/20 0554 11/02/20 0310 11/02/20 0312 11/03/20 0505 11/04/20 0304 11/05/20 0314  WBC 18.5*   < > 16.0*   < > 10.7* 8.5 9.7  NEUTROABS 16.7*   < > 14.6*   < > 8.7* 5.9 6.3  HGB 7.4*   < > 8.9*   < > 9.3* 9.4* 9.5*  HCT 23.0*   < > 28.1*   < > 29.8* 29.6* 29.9*  MCV 81.9  --  85.7  --  85.4 85.8 85.7  PLT 74*   < > 67*   < > 68* 82* 102*   < > = values in this interval not displayed.   Cardiac Enzymes: No results for input(s): CKTOTAL, CKMB, CKMBINDEX, TROPONINI in the last 168 hours. CBG: Recent Labs  Lab 11/04/20 1914 11/04/20 2303 11/05/20 0305 11/05/20 0708 11/05/20 1108  GLUCAP 122* 95 74 87 146*    Iron Studies: No results for input(s): IRON, TIBC, TRANSFERRIN, FERRITIN in the last 72 hours. Studies/Results: No results found. . B-complex with vitamin C  1 tablet Per Tube Daily  . docusate  100 mg Per Tube BID  . feeding supplement  237 mL Oral TID BM  . insulin aspart  0-9 Units Subcutaneous Q4H  . insulin detemir  5 Units Subcutaneous BID  . pantoprazole (PROTONIX) IV  40 mg Intravenous QHS  . polyethylene glycol  17 g Per Tube Daily  . sodium chloride flush  10-40 mL Intracatheter Q12H    BMET    Component Value Date/Time   NA 136 11/05/2020 0314   K 3.8 11/05/2020 0314   CL 99 11/05/2020 0314   CO2 24 11/05/2020 0314   GLUCOSE  96 11/05/2020 0314   BUN 23 11/05/2020 0314   CREATININE 1.63 (H) 11/05/2020 0314   CALCIUM 8.2 (L) 11/05/2020 0314   GFRNONAA 48 (L) 11/05/2020 0314   GFRAA 26 (L) 04/21/2020 1300   CBC    Component Value Date/Time   WBC 9.7 11/05/2020 0314   RBC 3.49 (L) 11/05/2020 0314   HGB 9.5 (L) 11/05/2020 0314   HCT 29.9 (L) 11/05/2020 0314   PLT 102 (L) 11/05/2020 0314   MCV 85.7 11/05/2020 0314   MCH 27.2 11/05/2020 0314   MCHC 31.8 11/05/2020 0314   RDW 17.7 (H) 11/05/2020 0314   LYMPHSABS 1.5 11/05/2020 0314   MONOABS 1.4 (H) 11/05/2020 0314   EOSABS 0.2 11/05/2020 0314   BASOSABS 0.0 11/05/2020 0314     Assessment/Plan:  1. 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  1. Started on CRRT due to severe oliguric AKI on 10/30/20. 2. Able to UF 2 liters overnight without needing pressors. 3. Will stop CRRT today and transition to IHD tomorrow to see if BP tolerates UF.  2. Severe  Septic shock from Enterococcus faecalis bacteremia- presumably urinary source. Abx per ID and primary team.  TEE negative for vegetations. 3. 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 IR following. 1. Appreciateurologyinput and will retry placing left PCN in a few days or longer once hydro is present. 4. Left perinephric hematoma- continue to follow H/H and transfuse prn. 5. Thrombocytopenia- likely due to critical illness. Anticoagulation on hold. Follow platelets. 6. Hyperkalemia- improved with CRRT initiation on 10/30/20.  7. Hypophosphatemia- follow and replace as needed. 1. s/p30 mmol Kphos 11/30 8. Acute hypoxemic  respiratory failure- s/p intubation and currently on vent per PCCM. 9. AMS- EEG nonspecific.   Donetta Potts, MD [employer] [phone]

## 2020-11-06 DIAGNOSIS — R7881 Bacteremia: Secondary | ICD-10-CM | POA: Diagnosis not present

## 2020-11-06 DIAGNOSIS — N179 Acute kidney failure, unspecified: Secondary | ICD-10-CM | POA: Diagnosis not present

## 2020-11-06 DIAGNOSIS — K661 Hemoperitoneum: Secondary | ICD-10-CM | POA: Diagnosis not present

## 2020-11-06 DIAGNOSIS — A419 Sepsis, unspecified organism: Secondary | ICD-10-CM | POA: Diagnosis not present

## 2020-11-06 LAB — GLUCOSE, CAPILLARY
Glucose-Capillary: 109 mg/dL — ABNORMAL HIGH (ref 70–99)
Glucose-Capillary: 116 mg/dL — ABNORMAL HIGH (ref 70–99)
Glucose-Capillary: 119 mg/dL — ABNORMAL HIGH (ref 70–99)
Glucose-Capillary: 120 mg/dL — ABNORMAL HIGH (ref 70–99)
Glucose-Capillary: 132 mg/dL — ABNORMAL HIGH (ref 70–99)
Glucose-Capillary: 172 mg/dL — ABNORMAL HIGH (ref 70–99)

## 2020-11-06 LAB — HEPATITIS B CORE ANTIBODY, IGM: Hep B C IgM: NONREACTIVE

## 2020-11-06 LAB — CBC
HCT: 26.8 % — ABNORMAL LOW (ref 39.0–52.0)
Hemoglobin: 8.6 g/dL — ABNORMAL LOW (ref 13.0–17.0)
MCH: 27.3 pg (ref 26.0–34.0)
MCHC: 32.1 g/dL (ref 30.0–36.0)
MCV: 85.1 fL (ref 80.0–100.0)
Platelets: 167 10*3/uL (ref 150–400)
RBC: 3.15 MIL/uL — ABNORMAL LOW (ref 4.22–5.81)
RDW: 17.8 % — ABNORMAL HIGH (ref 11.5–15.5)
WBC: 9.8 10*3/uL (ref 4.0–10.5)
nRBC: 0 % (ref 0.0–0.2)

## 2020-11-06 LAB — CBC WITH DIFFERENTIAL/PLATELET
Abs Immature Granulocytes: 0.17 10*3/uL — ABNORMAL HIGH (ref 0.00–0.07)
Basophils Absolute: 0 10*3/uL (ref 0.0–0.1)
Basophils Relative: 0 %
Eosinophils Absolute: 0.2 10*3/uL (ref 0.0–0.5)
Eosinophils Relative: 2 %
HCT: 26.1 % — ABNORMAL LOW (ref 39.0–52.0)
Hemoglobin: 8.3 g/dL — ABNORMAL LOW (ref 13.0–17.0)
Immature Granulocytes: 2 %
Lymphocytes Relative: 15 %
Lymphs Abs: 1.5 10*3/uL (ref 0.7–4.0)
MCH: 27.1 pg (ref 26.0–34.0)
MCHC: 31.8 g/dL (ref 30.0–36.0)
MCV: 85.3 fL (ref 80.0–100.0)
Monocytes Absolute: 1.1 10*3/uL — ABNORMAL HIGH (ref 0.1–1.0)
Monocytes Relative: 12 %
Neutro Abs: 6.8 10*3/uL (ref 1.7–7.7)
Neutrophils Relative %: 69 %
Platelets: 148 10*3/uL — ABNORMAL LOW (ref 150–400)
RBC: 3.06 MIL/uL — ABNORMAL LOW (ref 4.22–5.81)
RDW: 18 % — ABNORMAL HIGH (ref 11.5–15.5)
WBC: 9.8 10*3/uL (ref 4.0–10.5)
nRBC: 0.2 % (ref 0.0–0.2)

## 2020-11-06 LAB — COMPREHENSIVE METABOLIC PANEL
ALT: 54 U/L — ABNORMAL HIGH (ref 0–44)
AST: 58 U/L — ABNORMAL HIGH (ref 15–41)
Albumin: 2.4 g/dL — ABNORMAL LOW (ref 3.5–5.0)
Alkaline Phosphatase: 86 U/L (ref 38–126)
Anion gap: 11 (ref 5–15)
BUN: 41 mg/dL — ABNORMAL HIGH (ref 8–23)
CO2: 24 mmol/L (ref 22–32)
Calcium: 8 mg/dL — ABNORMAL LOW (ref 8.9–10.3)
Chloride: 99 mmol/L (ref 98–111)
Creatinine, Ser: 2.89 mg/dL — ABNORMAL HIGH (ref 0.61–1.24)
GFR, Estimated: 24 mL/min — ABNORMAL LOW (ref >60)
Glucose, Bld: 145 mg/dL — ABNORMAL HIGH (ref 70–99)
Potassium: 3.5 mmol/L (ref 3.5–5.1)
Sodium: 134 mmol/L — ABNORMAL LOW (ref 135–145)
Total Bilirubin: 1 mg/dL (ref 0.3–1.2)
Total Protein: 6 g/dL — ABNORMAL LOW (ref 6.5–8.1)

## 2020-11-06 LAB — FUNGUS CULTURE, BLOOD: Culture: NO GROWTH

## 2020-11-06 LAB — RENAL FUNCTION PANEL
Albumin: 2.9 g/dL — ABNORMAL LOW (ref 3.5–5.0)
Anion gap: 13 (ref 5–15)
BUN: 23 mg/dL (ref 8–23)
CO2: 26 mmol/L (ref 22–32)
Calcium: 8.1 mg/dL — ABNORMAL LOW (ref 8.9–10.3)
Chloride: 98 mmol/L (ref 98–111)
Creatinine, Ser: 1.95 mg/dL — ABNORMAL HIGH (ref 0.61–1.24)
GFR, Estimated: 38 mL/min — ABNORMAL LOW (ref >60)
Glucose, Bld: 129 mg/dL — ABNORMAL HIGH (ref 70–99)
Phosphorus: 2.2 mg/dL — ABNORMAL LOW (ref 2.5–4.6)
Potassium: 3.8 mmol/L (ref 3.5–5.1)
Sodium: 137 mmol/L (ref 135–145)

## 2020-11-06 LAB — HEPATITIS B SURFACE ANTIGEN: Hepatitis B Surface Ag: NONREACTIVE

## 2020-11-06 LAB — MAGNESIUM: Magnesium: 2.4 mg/dL (ref 1.7–2.4)

## 2020-11-06 MED ORDER — ALBUMIN HUMAN 25 % IV SOLN
INTRAVENOUS | Status: AC
  Administered 2020-11-06: 25 g
  Filled 2020-11-06: qty 100

## 2020-11-06 MED ORDER — DIPHENHYDRAMINE HCL 25 MG PO CAPS
50.0000 mg | ORAL_CAPSULE | Freq: Every evening | ORAL | Status: DC | PRN
  Administered 2020-11-07 – 2020-11-15 (×7): 50 mg via ORAL
  Filled 2020-11-06 (×7): qty 2

## 2020-11-06 MED ORDER — HEPARIN SODIUM (PORCINE) 1000 UNIT/ML IJ SOLN
INTRAMUSCULAR | Status: AC
  Filled 2020-11-06: qty 4

## 2020-11-06 MED ORDER — FAMOTIDINE 20 MG PO TABS
20.0000 mg | ORAL_TABLET | Freq: Every day | ORAL | Status: DC
  Administered 2020-11-06 – 2020-11-15 (×10): 20 mg via ORAL
  Filled 2020-11-06 (×11): qty 1

## 2020-11-06 MED ORDER — MELATONIN 3 MG PO TABS
3.0000 mg | ORAL_TABLET | Freq: Once | ORAL | Status: AC
  Administered 2020-11-06: 3 mg via ORAL
  Filled 2020-11-06: qty 1

## 2020-11-06 NOTE — Progress Notes (Signed)
NAME:  Paola Flynt, MRN:  [id number], DOB:  31-May-1961, LOS: 7 ADMISSION DATE:  10/29/2020, CONSULTATION DATE:  10/30/2020 REFERRING MD:  Ripley Fraise, MD, CHIEF COMPLAINT:  Nausea, vomiting, blood in nephrostomy tube, found to have renal hematoma   Brief History   Mr. 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 He presented to OSH ED with nausea, vomiting, fever and blood in his nephrostomy tube that began the day of admission. Imaging in the ED found a renal hematoma.  History of present illness   Mr. 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 He presented to OSH ED with nausea, vomiting, fever and blood in his nephrostomy tube that began the day of admission.   In the ED, code sepsis was called due to concern for infection, fluids and cefepime were given. CT abdomen pelvis revealed shallow location of left percutaneous nephrostomy tube suggesting that it has withdrawn from the renal pelvis and a prominent left renal hematoma. He was tachycardic to the 160s and febrile to 104. CXR was unrevealing.   Per ED documentation, urology recommended admission to intensive care and reversal of anticoagulation. 1 dose of protamine 50mg  was administered to reverse the nursing home reported anticoagulation (per ED documentation, his last dose of heparin subcutaneous 5000 units was at 2200 on November 26th).   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 IVC filter not able to be placed due to extensive DVT -Subdural hematoma, s/p craniotomy 01/26/2020  Significant Hospital Events   Presented 10/30/20 to OSH ED, found to have renal hematoma and concern for sepsis  Consults:  Urology Nephrology Interventional Radiology Infectious Disease  Procedures:  NA  Significant Diagnostic Tests:   10/30/20 CT Abdomen Pelvis WO contrast: 1. Left percutaneous nephrostomy tube present, shallow location within the kidney suggesting that it has withdrawn from the renal pelvis 2. Prominent left renal hematoma with extensive stranding and hematoma in the pararenal spaces and apparently extending into the mesentery 3. Periaortic retroperitoneal stranding and soft tissue prominence suggesting retroperitoneal fibrosis, confluent lymphadenopathy, or aortitis. Appearances are similar to the prior study  10/31/20 CT Head 1. No substantial change in left left frontal craniotomy with subjacent 1.4 cm thick underlying subdural hematoma. Similar so shaded mass effect. 2. Left frontal e white matter encephalomalacia. 3. Similar findings of chronic bilateral maxillary sinusitis, as detailed above.  10/31/20 CT Abdomen/Pelvis 1. Mild increase in size of left kidney subcapsular hematoma with signs of progressive hemoperitoneum. Hemoperitoneum is again noted and is also mildly increased in volume from previous study. 2. Collection of high attenuation material anterior to the mid right kidney likely represents extravasated contrast material. 3. Signs of retroperitoneal fibrosis with increased retroperitoneal soft tissue surrounding the abdominal aorta and encasing bilateral ureters. 4. Small bilateral pleural effusions with overlying subsegmental atelectasis. 5. 3.5 cm infrarenal abdominal aortic  aneurysm. Aortic aneurysm NOS (ICD10-I71.9). 6. Aortic atherosclerosis.  10/31/2020 ECHO 1. Left ventricular ejection fraction, by estimation, is  65 to 70%. The  left ventricle has normal function. The left ventricle has no regional  wall motion abnormalities. Left ventricular diastolic parameters are  consistent with Grade I diastolic  dysfunction (impaired relaxation).  2. Right ventricular systolic function is normal. The right ventricular  size is normal.  3. The mitral valve is abnormal. Trivial mitral valve regurgitation.  4. Possible aortic valve vegetation is visualized on the noncoronary cusp  vs sclerosis and motion artifact. There is no associated aortic  insufficiency. Further imaging with TEE is recommended.  5. Possible small aortic valve mass on the noncoronary cusp which is  echogenic.  6. The aortic valve is tricuspid. Aortic valve regurgitation is not  Visualized.  Micro Data:   10/30/20 Blood culture- E faecalis Urine culture- No growth  Antimicrobials:  11/26 Cefepime 11/27 Ciprofloxacin 11/27 Meropenem 11/27 Vancomycin 11/28 Zosyn  11/29 Ceftriaxone + Ampicillin>>  Interim history/subjective:   No issues overnight.  CVVHD stopped yesterday.  Her plans for trial of IHD in the ICU to see if tolerates based on blood pressures.  Objective   Blood pressure 122/79, pulse (!) 115, temperature 98.9 F (37.2 C), temperature source Oral, resp. rate (!) 24, height 6\' 1"  (1.854 m), weight 125 kg, SpO2 99 %.        Intake/Output Summary (Last 24 hours) at 11/06/2020 0729 Last data filed at 11/06/2020 0600 Gross per 24 hour  Intake 1078.24 ml  Output 922 ml  Net 156.24 ml   Filed Weights   11/04/20 0418 11/05/20 0500 11/06/20 0331  Weight: 122.3 kg 120.6 kg 125 kg    Examination: Constitutional: Elderly male resting comfortably in bed Eyes: Tracking appropriately  ENT: Mucous membranes dry Cardiovascular: Regular rate rhythm, S1-S2 Respiratory:  Bilateral breath sounds no crackles no wheeze Gastrointestinal: Soft nontender nondistended Extremities: Dependent upper and lower extremity edema Skin: No rash Neurologic: Moves all 4 extremities  Resolved Hospital Problem list   NA  Assessment & Plan:   Severe septic shock secondary to E faecalis bacteremia Likely urinary source from obstructive uropathy -Continue ampicillin per ID -Restart vasopressors to maintain mean arterial pressure greater than 65 mmHg as needed.  Left perinephric hematoma from dislodged nephrostomy tube Underlying retroperitoneal fibrosis - IR following, unsuccessful attempt at replacing dislodge nephrostomy tube. - Repeat CT abdomen 12/1 showed stability and left renal capsular hematoma with no hydronephrosis. No plan for placement of left nephrostomy tube at this time per IR. - Urology consulted. Recommend continued monitoring of perinephric hematoma and patient will require left PCN placement at some point in future.  Plan: Follow H&H His hgb has dropped some. We will check a repeat this afternoon to ensure no additional bleeding.  Holding CellCept  Thrombocytopenia- related to acute illness Plan: Follow platelet count Holding AC  Severe acute renal failure with hyperkalemia- started on CRRT 11/27-12/02/2020 Plan: Initiate IHD today in the ICU to ensure hemodynamic stability. Follow electrolytes  Acute hypoxemic respiratory failure- due to marked acidemia, inability to protect airway from metabolic disarray Plan: Wean from FiO2 as tolerated to maintain SPO2 greater than 90%. Currently on nasal cannula  Acute metabolic encephalopathy and question of associated seizure 11/27-28 - EEG negative for seizure activity, consistent with diffuse encephalopathy, non-specific.  Plan: Observe  Hyperglycemia Plan: Levemir plus CBGs Goal CBG 140-180  Hyponatremia Observe   Progression needs:  PT OT  Remove rectal tube  Remove right CVL (if he  tolerates HD and doesn't need pressors)  Keep left HD cath Keep condom cath  Best practice (evaluated daily)   Diet: speech eval, soft diet for now Pain/Anxiety/Delirium protocol (if indicated): n/a VAP protocol (if indicated): n/a DVT prophylaxis: Hold in setting of renal hematoma, SCDs ordered GI prophylaxis: NA Glucose control: levemir, SSI Mobility: PT OT Ordered 11/06/2020  last date of multidisciplinary goals of care discussion:  Family and staff present: phone, nurse + md Summary of discussion: continue aggressive care Follow up goals of care discussion due: Family to be updated  Code Status: Full Disposition: ICU   This patient is critically ill with multiple organ system failure; which, requires frequent high complexity decision making, assessment, support, evaluation, and titration of therapies. This was completed through the application of advanced monitoring technologies and extensive interpretation of multiple databases. During this encounter critical care time was devoted to patient care services described in this note for 31 minutes.  Garner Nash, DO [facility] 11/06/2020 7:30 AM

## 2020-11-06 NOTE — Progress Notes (Signed)
Patient ID: Brendan [name], male   DOB: Mar 27, 1961, 59 y.o.   MRN: [id number] S: More awake and alert today and no new complaints. O:BP 101/67   Pulse (!) 109   Temp 98.3 F (36.8 C) (Oral)   Resp (!) 22   Ht 6\' 1"  (1.854 m)   Wt 125 kg   SpO2 97%   BMI 36.36 kg/m   Intake/Output Summary (Last 24 hours) at 11/06/2020 1051 Last data filed at 11/06/2020 0900 Gross per 24 hour  Intake 928.26 ml  Output 730 ml  Net 198.26 ml   Intake/Output: I/O last 3 completed shifts: In: 2060.6 [P.O.:1280; I.V.:380.6; IV Piggyback:400] Out: 3185 [Urine:885; Other:2300]  Intake/Output this shift:  Total I/O In: -  Out: 230 [Urine:180; Stool:50] Weight change: 4.4 kg Gen: NAD CVS: tachy at 109 Resp: occ rhonchi bilaterally Abd: +BS, soft,NT/ND Ext: minimal pretibial edema  Recent Labs  Lab 10/31/20 0452 10/31/20 0722 11/01/20 0445 11/01/20 0554 11/02/20 0310 11/02/20 0312 11/02/20 1544 11/02/20 1544 11/03/20 0505 11/03/20 1543 11/04/20 0304 11/04/20 1634 11/05/20 0314 11/05/20 1600 11/06/20 0324  NA 136   < > 136   < > 137   < > 135   < > 138 137 137  138 140 136 136 134*  K 5.9*   < > 3.0*   < > 3.2*   < > 3.4*   < > 3.4* 4.1 3.8  3.8 4.3 3.8 3.7 3.5  CL 100   < > 95*   < > 98   < > 100   < > 102 104 101  103 102 99 100 99  CO2 18*   < > 26   < > 25   < > 23   < > 23 22 24  24 24 24 23 24   GLUCOSE 260*   < > 183*   < > 285*   < > 204*   < > 109* 206* 108*  108* 124* 96 179* 145*  BUN 31*   < > 19   < > 24*   < > 24*   < > 26* 27* 26*  27* 22 23 31* 41*  CREATININE 4.36*   < > 2.11*   < > 1.86*   < > 1.68*   < > 1.50* 1.48* 1.54*  1.53* 1.47* 1.63* 2.15* 2.89*  ALBUMIN 2.9*   < > 2.9*   < > 2.6*   < > 2.6*   < > 2.6* 2.5* 2.7*  2.7* 2.6* 2.6* 2.4* 2.4*  CALCIUM 7.4*   < > 7.7*   < > 7.7*   < > 7.6*   < > 8.0* 7.7* 8.1*  8.1* 8.3* 8.2* 8.0* 8.0*  PHOS 3.0   < > 2.7   < > 1.7*   < > 2.8  --  2.6 2.4* 2.6 2.5 2.1* 2.2*  --   AST 82*  --  76*  --  50*  --   --   --  43*  --   57*  --  76*  --  58*  ALT 44  --  46*  --  40  --   --   --  37  --  47*  --  64*  --  54*   < > = values in this interval not displayed.   Liver Function Tests: Recent Labs  Lab 11/04/20 0304 11/04/20 1634 11/05/20 0314 11/05/20 1600 11/06/20 0324  AST 57*  --  76*  --  58*  ALT 47*  --  64*  --  54*  ALKPHOS 67  --  85  --  86  BILITOT 0.7  --  1.4*  --  1.0  PROT 6.6  --  6.6  --  6.0*  ALBUMIN 2.7*  2.7*   < > 2.6* 2.4* 2.4*   < > = values in this interval not displayed.   No results for input(s): LIPASE, AMYLASE in the last 168 hours. No results for input(s): AMMONIA in the last 168 hours. CBC: Recent Labs  Lab 11/02/20 0310 11/02/20 0312 11/03/20 0505 11/03/20 0505 11/04/20 0304 11/05/20 0314 11/06/20 0324  WBC 16.0*   < > 10.7*   < > 8.5 9.7 9.8  NEUTROABS 14.6*   < > 8.7*   < > 5.9 6.3 6.8  HGB 8.9*   < > 9.3*   < > 9.4* 9.5* 8.3*  HCT 28.1*   < > 29.8*   < > 29.6* 29.9* 26.1*  MCV 85.7  --  85.4  --  85.8 85.7 85.3  PLT 67*   < > 68*   < > 82* 102* 148*   < > = values in this interval not displayed.   Cardiac Enzymes: No results for input(s): CKTOTAL, CKMB, CKMBINDEX, TROPONINI in the last 168 hours. CBG: Recent Labs  Lab 11/05/20 1511 11/05/20 1939 11/05/20 2311 11/06/20 0327 11/06/20 0741  GLUCAP 166* 170* 131* 109* 119*    Iron Studies: No results for input(s): IRON, TIBC, TRANSFERRIN, FERRITIN in the last 72 hours. Studies/Results: No results found. . B-complex with vitamin C  1 tablet Per Tube Daily  . docusate  100 mg Per Tube BID  . famotidine  20 mg Oral QHS  . feeding supplement  237 mL Oral TID BM  . insulin aspart  0-9 Units Subcutaneous Q4H  . insulin detemir  5 Units Subcutaneous BID  . polyethylene glycol  17 g Per Tube Daily  . sodium chloride flush  10-40 mL Intracatheter Q12H    BMET    Component Value Date/Time   NA 134 (L) 11/06/2020 0324   K 3.5 11/06/2020 0324   CL 99 11/06/2020 0324   CO2 24 11/06/2020 0324    GLUCOSE 145 (H) 11/06/2020 0324   BUN 41 (H) 11/06/2020 0324   CREATININE 2.89 (H) 11/06/2020 0324   CALCIUM 8.0 (L) 11/06/2020 0324   GFRNONAA 24 (L) 11/06/2020 0324   GFRAA 26 (L) 04/21/2020 1300   CBC    Component Value Date/Time   WBC 9.8 11/06/2020 0324   RBC 3.06 (L) 11/06/2020 0324   HGB 8.3 (L) 11/06/2020 0324   HCT 26.1 (L) 11/06/2020 0324   PLT 148 (L) 11/06/2020 0324   MCV 85.3 11/06/2020 0324   MCH 27.1 11/06/2020 0324   MCHC 31.8 11/06/2020 0324   RDW 18.0 (H) 11/06/2020 0324   LYMPHSABS 1.5 11/06/2020 0324   MONOABS 1.1 (H) 11/06/2020 0324   EOSABS 0.2 11/06/2020 0324   BASOSABS 0.0 11/06/2020 0324   Assessment/Plan:  1. 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  1. Started on CRRT due to severe oliguric AKI on 10/30/20 and stopped 11/05/20 2. Will attempt transition to IHD today to see if BP tolerates UF. 2. Severe Septic shock from Enterococcus faecalis bacteremia- presumably urinary source. Abx per ID and primary team.TEE negative for vegetations. 3. 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 IR following. 1. Appreciateurologyinput and will retry placing left PCN in a few days or longer once hydro is present. 2. Starting to make some urine so might be time to order renal US and if hydro present reconsult IR for PNT placement. 4. Left perinephric hematoma- continue to follow H/H and transfuse prn. 5. Anemia of critical illness/ABLA- transfuse prn 6. Thrombocytopenia- likely due to critical illness. Anticoagulation on hold. Platelets improving. 7. Hyperkalemia- improved with CRRT  initiation on 10/30/20.  8. Hypophosphatemia- follow and replace as needed. 9. Acute hypoxemic respiratory failure- extubated and doing well 10. AMS- EEGnonspecific. improved   Donetta Potts, MD [employer] [phone]

## 2020-11-06 NOTE — Progress Notes (Signed)
[facility] Progress Note [name] DOB: 08-24-1961 MRN: [id number]   Date of Service  11/06/2020  HPI/Events of Note  Patient requesting a sleep aid.  eICU Interventions  Melatonin 3 mg po x 1 ordered.        Kerry Kass Justo Hengel 11/06/2020, 2:33 AM

## 2020-11-07 ENCOUNTER — Inpatient Hospital Stay (HOSPITAL_COMMUNITY): Payer: Medicaid Other

## 2020-11-07 DIAGNOSIS — B952 Enterococcus as the cause of diseases classified elsewhere: Secondary | ICD-10-CM | POA: Diagnosis not present

## 2020-11-07 DIAGNOSIS — R7881 Bacteremia: Secondary | ICD-10-CM | POA: Diagnosis not present

## 2020-11-07 LAB — RENAL FUNCTION PANEL
Albumin: 2.6 g/dL — ABNORMAL LOW (ref 3.5–5.0)
Albumin: 2.7 g/dL — ABNORMAL LOW (ref 3.5–5.0)
Anion gap: 11 (ref 5–15)
Anion gap: 13 (ref 5–15)
BUN: 32 mg/dL — ABNORMAL HIGH (ref 8–23)
BUN: 33 mg/dL — ABNORMAL HIGH (ref 8–23)
CO2: 27 mmol/L (ref 22–32)
CO2: 25 mmol/L (ref 22–32)
Calcium: 8.2 mg/dL — ABNORMAL LOW (ref 8.9–10.3)
Calcium: 8.2 mg/dL — ABNORMAL LOW (ref 8.9–10.3)
Chloride: 99 mmol/L (ref 98–111)
Chloride: 100 mmol/L (ref 98–111)
Creatinine, Ser: 2.61 mg/dL — ABNORMAL HIGH (ref 0.61–1.24)
Creatinine, Ser: 3.09 mg/dL — ABNORMAL HIGH (ref 0.61–1.24)
GFR, Estimated: 27 mL/min — ABNORMAL LOW (ref >60)
GFR, Estimated: 22 mL/min — ABNORMAL LOW (ref >60)
Glucose, Bld: 114 mg/dL — ABNORMAL HIGH (ref 70–99)
Glucose, Bld: 124 mg/dL — ABNORMAL HIGH (ref 70–99)
Phosphorus: 2.9 mg/dL (ref 2.5–4.6)
Phosphorus: 3.7 mg/dL (ref 2.5–4.6)
Potassium: 3.7 mmol/L (ref 3.5–5.1)
Potassium: 3.7 mmol/L (ref 3.5–5.1)
Sodium: 137 mmol/L (ref 135–145)
Sodium: 138 mmol/L (ref 135–145)

## 2020-11-07 LAB — CBC WITH DIFFERENTIAL/PLATELET
Abs Immature Granulocytes: 0.09 10*3/uL — ABNORMAL HIGH (ref 0.00–0.07)
Basophils Absolute: 0 10*3/uL (ref 0.0–0.1)
Basophils Relative: 0 %
Eosinophils Absolute: 0.3 10*3/uL (ref 0.0–0.5)
Eosinophils Relative: 3 %
HCT: 26.7 % — ABNORMAL LOW (ref 39.0–52.0)
Hemoglobin: 8.3 g/dL — ABNORMAL LOW (ref 13.0–17.0)
Immature Granulocytes: 1 %
Lymphocytes Relative: 13 %
Lymphs Abs: 1.1 10*3/uL (ref 0.7–4.0)
MCH: 26.9 pg (ref 26.0–34.0)
MCHC: 31.1 g/dL (ref 30.0–36.0)
MCV: 86.7 fL (ref 80.0–100.0)
Monocytes Absolute: 0.9 10*3/uL (ref 0.1–1.0)
Monocytes Relative: 10 %
Neutro Abs: 6 10*3/uL (ref 1.7–7.7)
Neutrophils Relative %: 73 %
Platelets: 191 10*3/uL (ref 150–400)
RBC: 3.08 MIL/uL — ABNORMAL LOW (ref 4.22–5.81)
RDW: 17.9 % — ABNORMAL HIGH (ref 11.5–15.5)
WBC: 8.5 10*3/uL (ref 4.0–10.5)
nRBC: 0 % (ref 0.0–0.2)

## 2020-11-07 LAB — HEMOGLOBIN A1C
Hgb A1c MFr Bld: 6.4 % — ABNORMAL HIGH (ref 4.8–5.6)
Mean Plasma Glucose: 136.98 mg/dL

## 2020-11-07 LAB — MAGNESIUM: Magnesium: 2.3 mg/dL (ref 1.7–2.4)

## 2020-11-07 LAB — GLUCOSE, CAPILLARY
Glucose-Capillary: 97 mg/dL (ref 70–99)
Glucose-Capillary: 105 mg/dL — ABNORMAL HIGH (ref 70–99)
Glucose-Capillary: 124 mg/dL — ABNORMAL HIGH (ref 70–99)
Glucose-Capillary: 116 mg/dL — ABNORMAL HIGH (ref 70–99)
Glucose-Capillary: 130 mg/dL — ABNORMAL HIGH (ref 70–99)

## 2020-11-07 MED ORDER — INSULIN ASPART 100 UNIT/ML ~~LOC~~ SOLN
0.0000 [IU] | Freq: Three times a day (TID) | SUBCUTANEOUS | Status: DC
  Administered 2020-11-09 – 2020-11-12 (×4): 2 [IU] via SUBCUTANEOUS

## 2020-11-07 MED ORDER — INSULIN ASPART 100 UNIT/ML ~~LOC~~ SOLN: 0.0000 [IU] | Freq: Every day | SUBCUTANEOUS | Status: DC

## 2020-11-07 NOTE — Evaluation (Signed)
Physical Therapy Evaluation/Discharge Patient Details Name: Brendan [name] MRN: [id number] DOB: 1961-04-24 Today's Date: 11/07/2020   History of Present Illness  Pt is a 59 y/o M presenting to the ED from [facility] on 11/26 with emesis, sepsis, and imaging that found a renal hematoma. PMH includes CKD stage IV, HTN, CVA with R hemiparesis, and subdural hematoma s/p craniotomy Feb 2021.  Clinical Impression  Pt shows decreased ability to perform bed mobility, but shows decreased need for assistance with rolling and supine to sit from the R side. Pt required verbal, tactile and physical assistance with all mobility. States that he scoots into a w/c at baseline but had decreased ability to motor plan L or R scooting even with +2 physical assistance. Pt is an unreliable historian of PLOF, as he keeps changing his answers and can't clearly state his independence level at the nursing home. Pt shows R UE > LE hemiparesis and no initiation of movement with either during functional mobility. Pt doesn't need skilled acute PT since he is at his baseline and pt is in agreeance for d/c from acute PT.     Follow Up Recommendations SNF;Supervision/Assistance - 24 hour    Equipment Recommendations  None recommended by PT    Recommendations for Other Services       Precautions / Restrictions Precautions Precautions: Fall Precaution Comments: R hemiparesis Restrictions Weight Bearing Restrictions: No      Mobility  Bed Mobility Overal bed mobility: Needs Assistance Bed Mobility: Rolling;Sidelying to Sit;Sit to Supine Rolling: Mod assist;Max assist Sidelying to sit: Mod assist;HOB elevated   Sit to supine: Max assist;+2 for physical assistance   General bed mobility comments: mod assist to roll to right, max to roll to left. With HOB 30 degrees, use of rail and cues able to transition from right side to sitting. Return to bed with max +2 assist to elevate legs and position trunk. total +2 to slide  toward [facility]    Transfers                 General transfer comment: attempted to stand x 2 trials with max +2 assist and RLE blocked. pt able to assist with anterior translation and activation of LLE but unable to rise from surface  Ambulation/Gait             General Gait Details: unable  Stairs            Wheelchair Mobility    Modified Rankin (Stroke Patients Only)       Balance Overall balance assessment: Needs assistance Sitting-balance support: Feet supported;Bilateral upper extremity supported Sitting balance-Leahy Scale: Fair Sitting balance - Comments: min guard to sit; pt required tactile/verbal cueing while sitting and performing LE exercises secondary to posterior trunk lean Postural control: Posterior lean     Standing balance comment: unable to stand                             Pertinent Vitals/Pain Pain Assessment: No/denies pain    Home Living Family/patient expects to be discharged to:: Skilled nursing facility                 Additional Comments: [facility] has been a resident since 10/24/2016    Prior Function Level of Independence: Needs assistance   Gait / Transfers Assistance Needed: total assist per [facility] staff     Comments: pt states that he feeds himself, changes his own  clothes. pt states PT would come 1x/week and would do exercises and walk with a RW, but that they haven't come in 2 months. pt states that he slides into WC 2x/week. pt unreliable historian as he keeps changing his answers on PLOF (ranging from he is walking on his own to being bed bound). Per staff pt has not been out of bed for quite some time and at that time was heavy 2 person assist     Hand Dominance   Dominant Hand: Left    Extremity/Trunk Assessment   Upper Extremity Assessment Upper Extremity Assessment: RUE deficits/detail RUE Deficits / Details: 0/5 RUE Sensation: WNL    Lower Extremity Assessment Lower  Extremity Assessment: RLE deficits/detail RLE Deficits / Details: 2- IR, 2- knee flexion, 0/5 everything else on R LE RLE Sensation: WNL    Cervical / Trunk Assessment Cervical / Trunk Assessment: Normal  Communication   Communication: No difficulties  Cognition Arousal/Alertness: Awake/alert Behavior During Therapy: WFL for tasks assessed/performed Overall Cognitive Status: Impaired/Different from baseline Area of Impairment: Memory;Following commands;Safety/judgement;Problem solving                   Current Attention Level: Sustained Memory: Decreased short-term memory Following Commands: Follows one step commands inconsistently;Follows one step commands with increased time Safety/Judgement: Decreased awareness of safety;Decreased awareness of deficits Awareness: Intellectual Problem Solving: Slow processing;Requires verbal cues;Requires tactile cues General Comments: pt A&O x4, but varies in his statement of PLOF (ranges from stating that he was independent with walking and scooting into w/c-but unable to show PT how to do it this session- to stating that he was bed bound)      General Comments      Exercises General Exercises - Lower Extremity Long Arc Quad: PROM;Strengthening;Right;5 reps;Seated;AROM;Left;10 reps Hip Flexion/Marching: AROM;Left;10 reps;Seated   Assessment/Plan    PT Assessment All further PT needs can be met in the next venue of care  PT Problem List Decreased strength;Decreased mobility;Decreased safety awareness;Decreased range of motion;Decreased activity tolerance;Decreased cognition;Decreased balance;Decreased knowledge of use of DME       PT Treatment Interventions      PT Goals (Current goals can be found in the Care Plan section)  Acute Rehab PT Goals PT Goal Formulation: All assessment and education complete, DC therapy    Frequency     Barriers to discharge        Co-evaluation               AM-PAC PT "6 Clicks"  Mobility  Outcome Measure Help needed turning from your back to your side while in a flat bed without using bedrails?: A Lot Help needed moving from lying on your back to sitting on the side of a flat bed without using bedrails?: A Lot Help needed moving to and from a bed to a chair (including a wheelchair)?: Total Help needed standing up from a chair using your arms (e.g., wheelchair or bedside chair)?: Total Help needed to walk in hospital room?: Total Help needed climbing 3-5 steps with a railing? : Total 6 Click Score: 8    End of Session Equipment Utilized During Treatment: Gait belt Activity Tolerance: Patient tolerated treatment well Patient left: in bed;with call bell/phone within reach;with bed alarm set Nurse Communication: Mobility status;Need for lift equipment PT Visit Diagnosis: Other abnormalities of gait and mobility (R26.89);Difficulty in walking, not elsewhere classified (R26.2);Muscle weakness (generalized) (M62.81);Other symptoms and signs involving the nervous system (I50.277)    Time: 4128-7867 PT Time Calculation (  min) (ACUTE ONLY): 27 min   Charges:   PT Evaluation $PT Eval Moderate Complexity: 1 Mod PT Treatments $Therapeutic Activity: 8-22 mins        Caleb Popp, SPT [id number]  Carly Reiman 11/07/2020, 8:52 AM

## 2020-11-07 NOTE — Progress Notes (Signed)
Patient ID: Brendan [name], male   DOB: Sep 29, 1961, 59 y.o.   MRN: [id number] S: No new complaints this morning. O:BP 121/77   Pulse (!) 110   Temp 98.1 F (36.7 C) (Oral)   Resp (!) 21   Ht 6\' 1"  (1.854 m)   Wt 119.6 kg   SpO2 99%   BMI 34.79 kg/m   Intake/Output Summary (Last 24 hours) at 11/07/2020 1029 Last data filed at 11/07/2020 0600 Gross per 24 hour  Intake 800.22 ml  Output 1675 ml  Net -874.78 ml   Intake/Output: I/O last 3 completed shifts: In: 1245.5 [P.O.:594; I.V.:351.5; IV Piggyback:300] Out: 2205 [Urine:1155; Other:1000; Stool:50]  Intake/Output this shift:  No intake/output data recorded. Weight change: 0 kg Gen: NAD CVS: tachy at 110, no rub Resp: cta Abd: +BS, soft, NT/ND XYV:OPFYT pretibial edema  Recent Labs  Lab 11/01/20 0445 11/01/20 0554 11/02/20 0310 11/02/20 0312 11/03/20 0505 11/03/20 0505 11/03/20 1543 11/03/20 1543 11/04/20 0304 11/04/20 1634 11/05/20 0314 11/05/20 1600 11/06/20 0324 11/06/20 1839 11/07/20 0407  NA 136   < > 137   < > 138   < > 137   < > 137  138 140 136 136 134* 137 137  K 3.0*   < > 3.2*   < > 3.4*   < > 4.1   < > 3.8  3.8 4.3 3.8 3.7 3.5 3.8 3.7  CL 95*   < > 98   < > 102   < > 104   < > 101  103 102 99 100 99 98 99  CO2 26   < > 25   < > 23   < > 22   < > 24  24 24 24 23 24 26 27   GLUCOSE 183*   < > 285*   < > 109*   < > 206*   < > 108*  108* 124* 96 179* 145* 129* 114*  BUN 19   < > 24*   < > 26*   < > 27*   < > 26*  27* 22 23 31* 41* 23 32*  CREATININE 2.11*   < > 1.86*   < > 1.50*   < > 1.48*   < > 1.54*  1.53* 1.47* 1.63* 2.15* 2.89* 1.95* 2.61*  ALBUMIN 2.9*   < > 2.6*   < > 2.6*   < > 2.5*   < > 2.7*  2.7* 2.6* 2.6* 2.4* 2.4* 2.9* 2.6*  CALCIUM 7.7*   < > 7.7*   < > 8.0*   < > 7.7*   < > 8.1*  8.1* 8.3* 8.2* 8.0* 8.0* 8.1* 8.2*  PHOS 2.7   < > 1.7*   < > 2.6   < > 2.4*  --  2.6 2.5 2.1* 2.2*  --  2.2* 2.9  AST 76*  --  50*  --  43*  --   --   --  57*  --  76*  --  58*  --   --   ALT 46*  --  40  --   37  --   --   --  47*  --  64*  --  54*  --   --    < > = values in this interval not displayed.   Liver Function Tests: Recent Labs  Lab 11/04/20 0304 11/04/20 1634 11/05/20 0314 11/05/20 1600 11/06/20 0324 11/06/20 1839 11/07/20 0407  AST 57*  --  76*  --  58*  --   --   ALT 47*  --  64*  --  54*  --   --   ALKPHOS 67  --  85  --  86  --   --   BILITOT 0.7  --  1.4*  --  1.0  --   --   PROT 6.6  --  6.6  --  6.0*  --   --   ALBUMIN 2.7*  2.7*   < > 2.6*   < > 2.4* 2.9* 2.6*   < > = values in this interval not displayed.   No results for input(s): LIPASE, AMYLASE in the last 168 hours. No results for input(s): AMMONIA in the last 168 hours. CBC: Recent Labs  Lab 11/04/20 0304 11/04/20 0304 11/05/20 0314 11/05/20 0314 11/06/20 0324 11/06/20 1839 11/07/20 0407  WBC 8.5   < > 9.7   < > 9.8 9.8 8.5  NEUTROABS 5.9   < > 6.3  --  6.8  --  6.0  HGB 9.4*   < > 9.5*   < > 8.3* 8.6* 8.3*  HCT 29.6*   < > 29.9*   < > 26.1* 26.8* 26.7*  MCV 85.8  --  85.7  --  85.3 85.1 86.7  PLT 82*   < > 102*   < > 148* 167 191   < > = values in this interval not displayed.   Cardiac Enzymes: No results for input(s): CKTOTAL, CKMB, CKMBINDEX, TROPONINI in the last 168 hours. CBG: Recent Labs  Lab 11/06/20 1548 11/06/20 2038 11/06/20 2338 11/07/20 0414 11/07/20 0738  GLUCAP 132* 116* 172* 97 105*    Iron Studies: No results for input(s): IRON, TIBC, TRANSFERRIN, FERRITIN in the last 72 hours. Studies/Results: No results found. . B-complex with vitamin C  1 tablet Per Tube Daily  . docusate  100 mg Per Tube BID  . famotidine  20 mg Oral QHS  . feeding supplement  237 mL Oral TID BM  . insulin aspart  0-15 Units Subcutaneous TID WC  . insulin aspart  0-5 Units Subcutaneous QHS  . polyethylene glycol  17 g Per Tube Daily  . sodium chloride flush  10-40 mL Intracatheter Q12H    BMET    Component Value Date/Time   NA 137 11/07/2020 0407   K 3.7 11/07/2020 0407   CL 99  11/07/2020 0407   CO2 27 11/07/2020 0407   GLUCOSE 114 (H) 11/07/2020 0407   BUN 32 (H) 11/07/2020 0407   CREATININE 2.61 (H) 11/07/2020 0407   CALCIUM 8.2 (L) 11/07/2020 0407   GFRNONAA 27 (L) 11/07/2020 0407   GFRAA 26 (L) 04/21/2020 1300   CBC    Component Value Date/Time   WBC 8.5 11/07/2020 0407   RBC 3.08 (L) 11/07/2020 0407   HGB 8.3 (L) 11/07/2020 0407   HCT 26.7 (L) 11/07/2020 0407   PLT 191 11/07/2020 0407   MCV 86.7 11/07/2020 0407   MCH 26.9 11/07/2020 0407   MCHC 31.1 11/07/2020 0407   RDW 17.9 (H) 11/07/2020 0407   LYMPHSABS 1.1 11/07/2020 0407   MONOABS 0.9 11/07/2020 0407   EOSABS 0.3 11/07/2020 0407   BASOSABS 0.0 11/07/2020 0407    Assessment/Plan:  1. 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  1. Started on CRRT due to severe oliguric AKI on 10/30/20 and stopped 11/05/20  2. Transitioned to IHD on 11/06/20 with some drop in BP but otherwise tolerated it well. 3. Plan for TTS schedule 4. Starting to see some increased UOP from condom cath 5. Will order renal US to evaluate for left hydronephrosis and possible replacement of nephrostomy tube. 2. Severe Septic shock from Enterococcus faecalis bacteremia- presumably urinary source. Abx per ID and primary team.TEE negative for vegetations. 3. 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 IR following. 1. Appreciateurologyinput and will retry placing left PCN in a few days or longer once hydro is present. 2. Starting to make some urine so will order renal US and if hydro present reconsult IR for PNT placement. 4. Left perinephric hematoma-  continue to follow H/H and transfuse prn. 5. Anemia of critical illness/ABLA- transfuse prn 6. Thrombocytopenia- likely due to critical illness. Anticoagulation on hold. Platelets improving. 7. Hyperkalemia- improved with CRRT initiation on 10/30/20.  8. Hypophosphatemia- follow and replace as needed. 9. Acute hypoxemic respiratory failure- extubated and doing well 10. AMS- EEGnonspecific. improved   Donetta Potts, MD [employer] [phone]

## 2020-11-07 NOTE — Progress Notes (Signed)
Spoke with Shea Stakes, RN who will follow-up with patient and primary nurse about order to discontinue central line.

## 2020-11-07 NOTE — Progress Notes (Signed)
NAME:  Brendan [name], MRN:  [id number], DOB:  11/27/1961, LOS: 8 ADMISSION DATE:  10/29/2020, CONSULTATION DATE:  10/30/2020 REFERRING MD:  Ripley Fraise, MD, CHIEF COMPLAINT:  Nausea, vomiting, blood in nephrostomy tube, found to have renal hematoma   Brief History   Mr. Brendan [name] is a 59 yo man with pmhx significant for CKD stage IV secondary to obstructive uropathy from idiopathic retroperitoneal fibrosis (on cellcept) requiring bilateral percutaneous nephrostomy tubes. He presented to OSH ED with nausea, vomiting, fever and blood in his nephrostomy tube that began the day of admission. Imaging in the ED found a renal hematoma.  History of present illness   Mr. Brendan [name] is a 59 yo man with pmhx significant for CKD stage IV secondary to obstructive uropathy from idiopathic retroperitoneal fibrosis (on cellcept) requiring bilateral percutaneous nephrostomy tubes. He presented to OSH ED with nausea, vomiting, fever and blood in his nephrostomy tube that began the day of admission.   In the ED, code sepsis was called due to concern for infection, fluids and cefepime were given. CT abdomen pelvis revealed shallow location of left percutaneous nephrostomy tube suggesting that it has withdrawn from the renal pelvis and a prominent left renal hematoma. He was tachycardic to the 160s and febrile to 104. CXR was unrevealing.   Per ED documentation, urology recommended admission to intensive care and reversal of anticoagulation. 1 dose of protamine 50mg  was administered to reverse the nursing home reported anticoagulation (per ED documentation, his last dose of heparin subcutaneous 5000 units was at 2200 on November 26th).   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 IVC filter not able to be placed due to extensive DVT -Subdural hematoma, s/p craniotomy 01/26/2020  Significant Hospital Events   Presented 10/30/20 to OSH ED, found to have renal hematoma and concern for sepsis  Consults:  Urology Nephrology Interventional Radiology Infectious Disease  Procedures:  NA  Significant Diagnostic Tests:   10/30/20 CT Abdomen Pelvis WO contrast: 1. Left percutaneous nephrostomy tube present, shallow location within the kidney suggesting that it has withdrawn from the renal pelvis 2. Prominent left renal hematoma with extensive stranding and hematoma in the pararenal spaces and apparently extending into the mesentery 3. Periaortic retroperitoneal stranding and soft tissue prominence suggesting retroperitoneal fibrosis, confluent lymphadenopathy, or aortitis. Appearances are similar to the prior study  10/31/20 CT Head 1. No substantial change in left left frontal craniotomy with subjacent 1.4 cm thick underlying subdural hematoma. Similar so shaded mass effect. 2. Left frontal e white matter encephalomalacia. 3. Similar findings of chronic bilateral maxillary sinusitis, as detailed above.  10/31/20 CT Abdomen/Pelvis 1. Mild increase in size of left kidney subcapsular hematoma with signs of progressive hemoperitoneum. Hemoperitoneum is again noted and is also mildly increased in volume from previous study. 2. Collection of high attenuation material anterior to the mid right kidney likely represents extravasated contrast material. 3. Signs of retroperitoneal fibrosis with increased retroperitoneal soft tissue surrounding the abdominal aorta and encasing bilateral ureters. 4. Small bilateral pleural effusions with overlying subsegmental atelectasis. 5. 3.5 cm infrarenal abdominal aortic  aneurysm. Aortic aneurysm NOS (ICD10-I71.9). 6. Aortic atherosclerosis.  10/31/2020 ECHO 1. Left ventricular ejection fraction, by estimation, is  65 to 70%. The  left ventricle has normal function. The left ventricle has no regional  wall motion abnormalities. Left ventricular diastolic parameters are  consistent with Grade I diastolic  dysfunction (impaired relaxation).  2. Right ventricular systolic function is normal. The right ventricular  size is normal.  3. The mitral valve is abnormal. Trivial mitral valve regurgitation.  4. Possible aortic valve vegetation is visualized on the noncoronary cusp  vs sclerosis and motion artifact. There is no associated aortic  insufficiency. Further imaging with TEE is recommended.  5. Possible small aortic valve mass on the noncoronary cusp which is  echogenic.  6. The aortic valve is tricuspid. Aortic valve regurgitation is not  Visualized.  Micro Data:   10/30/20 Blood culture- E faecalis Urine culture- No growth  Antimicrobials:  11/26 Cefepime 11/27 Ciprofloxacin 11/27 Meropenem 11/27 Vancomycin 11/28 Zosyn  11/29 Ceftriaxone + Ampicillin>>  Interim history/subjective:   Patient tolerated IHD yesterday.  Resting in bed this morning.  Appears to be no distress.  Vital signs stable  Objective   Blood pressure 118/79, pulse (!) 109, temperature 99.2 F (37.3 C), temperature source Oral, resp. rate (!) 24, height 6\' 1"  (1.854 m), weight 119.6 kg, SpO2 98 %.        Intake/Output Summary (Last 24 hours) at 11/07/2020 0704 Last data filed at 11/07/2020 0600 Gross per 24 hour  Intake 800.22 ml  Output 1905 ml  Net -1104.78 ml   Filed Weights   11/06/20 1430 11/06/20 1805 11/07/20 0402  Weight: 125 kg 124 kg 119.6 kg    Examination: Constitutional: Elderly male resting in bed eating breakfast no distress Eyes: Tracking appropriately  ENT: Mucous membranes moist Cardiovascular: Regular rate rhythm, S1-S2 Respiratory:  Bilateral breath sounds diminished in the bases no wheeze Gastrointestinal: Soft, nontender nondistended Extremities: Dependent lower extremity edema Skin: No rash Neurologic: Moves all 4 extremities  Resolved Hospital Problem list   NA  Assessment & Plan:   Severe septic shock secondary to E faecalis bacteremia Likely urinary source from obstructive uropathy -Continue ampicillin per ID, stop date Christmas Day  Left perinephric hematoma from dislodged nephrostomy tube Underlying retroperitoneal fibrosis - IR following, unsuccessful attempt at replacing dislodge nephrostomy tube. - Repeat CT abdomen 12/1 showed stability and left renal capsular hematoma with no hydronephrosis. No plan for placement of left nephrostomy tube at this time per IR. - Urology consulted. Recommend continued monitoring of perinephric hematoma and patient will require left PCN placement at some point in future.  Plan: Follow H&H At some point will need to be started on anticoagulation. Holding CellCept with bacteremia  Thrombocytopenia- related to acute illness Plan: Follow platelet count  Severe acute renal failure with hyperkalemia- started on CRRT 11/27-12/02/2020 Plan: Continue IHD per nephrology  Acute hypoxemic respiratory failure, resolved Plan: Patient has successfully weaned to room air  Acute metabolic encephalopathy and question of associated seizure 11/27-28 - EEG negative for seizure activity, consistent with diffuse encephalopathy, non-specific.  Plan: Observed, resolved  Hyperglycemia Plan: Levemir stopped Continue's SSI  Hyponatremia Observe   Progression needs:  PT, OT Rectal tube was successfully removed yesterday. Keep HD catheter Remove CVL today. Orders placed for this  Patient is stable from ICU standpoint for transfer from the ICU.   Best practice (evaluated daily)   Diet: speech eval, soft diet for now Pain/Anxiety/Delirium protocol (if indicated): n/a VAP  protocol (if indicated): n/a DVT prophylaxis: Hold in setting of renal hematoma, SCDs ordered GI prophylaxis: NA Glucose control: levemir, SSI  Mobility: PT OT Ordered 11/07/2020  last date of multidisciplinary goals of care discussion:  Family and staff present: phone, nurse + md Summary of discussion: continue aggressive care Follow up goals of care discussion due: Family to be updated  Code Status: Full Disposition: ICU    Garner Nash, DO [facility] 11/07/2020 8:56 AM

## 2020-11-08 DIAGNOSIS — R7881 Bacteremia: Secondary | ICD-10-CM | POA: Diagnosis not present

## 2020-11-08 DIAGNOSIS — B952 Enterococcus as the cause of diseases classified elsewhere: Secondary | ICD-10-CM | POA: Diagnosis not present

## 2020-11-08 LAB — CBC WITH DIFFERENTIAL/PLATELET
Abs Immature Granulocytes: 0.05 10*3/uL (ref 0.00–0.07)
Basophils Absolute: 0 10*3/uL (ref 0.0–0.1)
Basophils Relative: 0 %
Eosinophils Absolute: 0.1 10*3/uL (ref 0.0–0.5)
Eosinophils Relative: 2 %
HCT: 27.3 % — ABNORMAL LOW (ref 39.0–52.0)
Hemoglobin: 8.8 g/dL — ABNORMAL LOW (ref 13.0–17.0)
Immature Granulocytes: 1 %
Lymphocytes Relative: 11 %
Lymphs Abs: 0.8 10*3/uL (ref 0.7–4.0)
MCH: 27 pg (ref 26.0–34.0)
MCHC: 32.2 g/dL (ref 30.0–36.0)
MCV: 83.7 fL (ref 80.0–100.0)
Monocytes Absolute: 0.7 10*3/uL (ref 0.1–1.0)
Monocytes Relative: 9 %
Neutro Abs: 5.8 10*3/uL (ref 1.7–7.7)
Neutrophils Relative %: 77 %
Platelets: 297 10*3/uL (ref 150–400)
RBC: 3.26 MIL/uL — ABNORMAL LOW (ref 4.22–5.81)
RDW: 17.5 % — ABNORMAL HIGH (ref 11.5–15.5)
WBC: 7.5 10*3/uL (ref 4.0–10.5)
nRBC: 0 % (ref 0.0–0.2)

## 2020-11-08 LAB — RENAL FUNCTION PANEL
Albumin: 2.4 g/dL — ABNORMAL LOW (ref 3.5–5.0)
Anion gap: 10 (ref 5–15)
BUN: 40 mg/dL — ABNORMAL HIGH (ref 8–23)
CO2: 25 mmol/L (ref 22–32)
Calcium: 8.4 mg/dL — ABNORMAL LOW (ref 8.9–10.3)
Chloride: 103 mmol/L (ref 98–111)
Creatinine, Ser: 3.71 mg/dL — ABNORMAL HIGH (ref 0.61–1.24)
GFR, Estimated: 18 mL/min — ABNORMAL LOW (ref >60)
Glucose, Bld: 129 mg/dL — ABNORMAL HIGH (ref 70–99)
Phosphorus: 4.2 mg/dL (ref 2.5–4.6)
Potassium: 3.8 mmol/L (ref 3.5–5.1)
Sodium: 138 mmol/L (ref 135–145)

## 2020-11-08 LAB — GLUCOSE, CAPILLARY
Glucose-Capillary: 108 mg/dL — ABNORMAL HIGH (ref 70–99)
Glucose-Capillary: 113 mg/dL — ABNORMAL HIGH (ref 70–99)
Glucose-Capillary: 114 mg/dL — ABNORMAL HIGH (ref 70–99)
Glucose-Capillary: 119 mg/dL — ABNORMAL HIGH (ref 70–99)
Glucose-Capillary: 158 mg/dL — ABNORMAL HIGH (ref 70–99)

## 2020-11-08 LAB — MAGNESIUM: Magnesium: 2.4 mg/dL (ref 1.7–2.4)

## 2020-11-08 LAB — HEPATITIS B SURFACE ANTIBODY, QUANTITATIVE: Hep B S AB Quant (Post): <3.1 m[IU]/mL — ABNORMAL LOW (ref >9.9)

## 2020-11-08 MED ORDER — LOPERAMIDE HCL 2 MG PO CAPS
2.0000 mg | ORAL_CAPSULE | Freq: Three times a day (TID) | ORAL | Status: DC | PRN
  Administered 2020-11-09: 2 mg via ORAL
  Filled 2020-11-08: qty 1

## 2020-11-08 MED ORDER — POLYETHYLENE GLYCOL 3350 17 G PO PACK
17.0000 g | PACK | Freq: Every day | ORAL | Status: DC
  Administered 2020-11-11 – 2020-11-16 (×3): 17 g via ORAL
  Filled 2020-11-08 (×5): qty 1

## 2020-11-08 MED ORDER — B COMPLEX-C PO TABS
1.0000 | ORAL_TABLET | Freq: Every day | ORAL | Status: DC
  Administered 2020-11-09 – 2020-11-11 (×3): 1 via ORAL
  Filled 2020-11-08 (×3): qty 1

## 2020-11-08 MED ORDER — DOCUSATE SODIUM 50 MG/5ML PO LIQD
100.0000 mg | Freq: Two times a day (BID) | ORAL | Status: DC
  Administered 2020-11-08 – 2020-11-16 (×4): 100 mg via ORAL
  Filled 2020-11-08 (×17): qty 10

## 2020-11-08 NOTE — Progress Notes (Signed)
Dr. Darnell Level:  patient has fragile skin and skin tear on buttox.  In addition he has had 3 loose bm's today.  We've applied barrier cream and are trying to keep him dry.  May we get something for his diarrhea?

## 2020-11-08 NOTE — Progress Notes (Signed)
IR received request for possible placement of a left percutaneous nephrostomy. A renal ultrasound performed 11/07/20 shows no hydronephrosis and no need for a PCN at this time.   No IR procedure planned and the order will be deleted. Dr. Marval Regal has been notified.  Please call IR with any questions or if a future need arises.  Brendan [name], [place] [phone] 11/08/2020, 11:00 AM

## 2020-11-08 NOTE — Evaluation (Signed)
Occupational Therapy Evaluation Patient Details Name: Brendan [name] MRN: [id number] DOB: 01-20-1961 Today's Date: 11/08/2020    History of Present Illness Pt is a 59 y/o M presenting to the ED from [facility] on 11/26 with emesis, sepsis, and imaging that found a renal hematoma. PMH includes CKD stage IV, HTN, CVA with R hemiparesis, and subdural hematoma s/p craniotomy Feb 2021.   Clinical Impression   Pt presents with extensive assist required for ADLs/selfcare and functional mobility. Pt is an unreliable historian of PLOF, as he keeps changing his answers and can't clearly state his independence level at the nursing home. Pt able to feed and groom self wit set up at bed level and seated OEB with assist for balance/supoprt (posterio leaning). Pt ist at baseline level of function per SNF staff and no further acute OT services are indicated at this time. OT will sign off  Follow Up Recommendations  SNF;Supervision/Assistance - 24 hour    Equipment Recommendations  None recommended by OT    Recommendations for Other Services       Precautions / Restrictions Precautions Precautions: Fall Precaution Comments: R hemiparesis Restrictions Weight Bearing Restrictions: No      Mobility Bed Mobility Overal bed mobility: Needs Assistance Bed Mobility: Rolling;Sidelying to Sit;Sit to Supine Rolling: Mod assist;Max assist Sidelying to sit: Mod assist;HOB elevated            Transfers                 General transfer comment: total A SNF, unable    Balance Overall balance assessment: Needs assistance Sitting-balance support: Feet supported;Bilateral upper extremity supported Sitting balance-Leahy Scale: Fair Sitting balance - Comments: min guard to sit; pt required tactile/verbal cueing while sitting and performing washing face Postural control: Posterior lean                                 ADL either performed or assessed with clinical judgement   ADL  Overall ADL's : Needs assistance/impaired Eating/Feeding: Set up;Sitting;Bed level   Grooming: Wash/dry hands;Wash/dry face;Set up;Bed level   Upper Body Bathing: Total assistance   Lower Body Bathing: Total assistance   Upper Body Dressing : Total assistance   Lower Body Dressing: Total assistance   Toilet Transfer: Total assistance   Toileting- Clothing Manipulation and Hygiene: Total assistance       Functional mobility during ADLs: Total assistance General ADL Comments: total A with most ADLs per SNF staff     Vision Baseline Vision/History: No visual deficits Patient Visual Report: No change from baseline       Perception     Praxis      Pertinent Vitals/Pain Pain Assessment: No/denies pain Pain Score: 0-No pain     Hand Dominance Left   Extremity/Trunk Assessment Upper Extremity Assessment Upper Extremity Assessment: RUE deficits/detail RUE Deficits / Details: 0/5 RUE Sensation: WNL       Cervical / Trunk Assessment Cervical / Trunk Assessment: Normal   Communication Communication Communication: No difficulties   Cognition Arousal/Alertness: Awake/alert Behavior During Therapy: WFL for tasks assessed/performed Overall Cognitive Status: Impaired/Different from baseline Area of Impairment: Memory;Following commands;Safety/judgement;Problem solving                     Memory: Decreased short-term memory Following Commands: Follows one step commands inconsistently;Follows one step commands with increased time Safety/Judgement: Decreased awareness of safety;Decreased awareness of deficits   Problem Solving:  Slow processing;Requires verbal cues;Requires tactile cues General Comments: pt A&O x 3, but varies in his statement of PLOF (ranges from stating that he was independent with walking and scooting into w/c- to bedbound and doesn't get OOB   General Comments       Exercises     Shoulder Instructions      Home Living Family/patient  expects to be discharged to:: Skilled nursing facility                                 Additional Comments: [facility] has been a resident since 10/24/2016      Prior Functioning/Environment Level of Independence: Needs assistance  Gait / Transfers Assistance Needed: total assist per [facility] staff     Comments: pt states that he feeds himself, changes his own clothes. pt states PT would come 1x/week and would do exercises and walk with a RW, but that they haven't come in 2 months. pt states that he slides into WC 2x/week. pt unreliable historian as he keeps changing his answers on PLOF (ranging from he is walking on his own to being bed bound). Per staff pt has not been out of bed for quite some time and at that time was heavy 2 person assist        OT Problem List: Decreased strength;Impaired balance (sitting and/or standing);Decreased cognition;Pain;Impaired tone;Decreased range of motion;Decreased activity tolerance;Decreased coordination;Decreased knowledge of use of DME or AE;Impaired UE functional use;Decreased safety awareness      OT Treatment/Interventions:      OT Goals(Current goals can be found in the care plan section) Acute Rehab OT Goals Patient Stated Goal: none stated OT Goal Formulation: With patient  OT Frequency:     Barriers to D/C:            Co-evaluation              AM-PAC OT "6 Clicks" Daily Activity     Outcome Measure Help from another person eating meals?: None Help from another person taking care of personal grooming?: None Help from another person toileting, which includes using toliet, bedpan, or urinal?: Total Help from another person bathing (including washing, rinsing, drying)?: Total Help from another person to put on and taking off regular upper body clothing?: Total Help from another person to put on and taking off regular lower body clothing?: Total 6 Click Score: 12   End of Session    Activity  Tolerance: Patient tolerated treatment well Patient left: in bed;with call bell/phone within reach;with nursing/sitter in room  OT Visit Diagnosis: Other abnormalities of gait and mobility (R26.89);Muscle weakness (generalized) (M62.81);Hemiplegia and hemiparesis;Other symptoms and signs involving cognitive function Hemiplegia - Right/Left: Right Hemiplegia - dominant/non-dominant: Dominant                Time: 1950-9326 OT Time Calculation (min): 20 min Charges:  OT General Charges $OT Visit: 1 Visit OT Evaluation $OT Eval Moderate Complexity: 1 Mod    Britt Bottom 11/08/2020, 4:18 PM

## 2020-11-08 NOTE — Progress Notes (Signed)
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  He was found to have hematoma around left kidney.  In the emergency room, code sepsis was called due to concern for infection.  Treated with IV fluids and antibiotics.  CT scan abdomen pelvis revealed left-sided tube was withdrawn and there was prominent left renal hematoma.  Patient was tachycardic and temperature 104.  Patient was admitted to ICU, was given protamine 50 mg to reverse his heparin subcu apparently used at nursing home. Patient is long-term nursing home resident, has history of extensive DVT previously treated with Lovenox, unable to place IVC filter because of extensive DVT on IVC, unable to tolerate Lovenox.  Also developed subdural hematoma status post craniotomy on 01/26/2020. He has a stroke with right-sided weakness. Blood cultures grew Enterococcus faecalis.   Assessment & Plan:   Principal Problem:   Enterococcal bacteremia Active Problems:   Septic shock ([place])   AKI (acute kidney injury) ([place])   Retroperitoneal hematoma  Septic shock secondary E faecalis bacteremia, likely source urine due to obstructive uropathy. Repeat cultures negative.  Urine cultures negative. ID recommended ampicillin until 12/25.  Left perinephric hematoma/retroperitoneal fibrosis: IR and urology following.  Plan for replacement of nephrostomy tube, however noted to have no adequate hydronephrosis for safe placement.  Defer to urology.  Acute renal failure with hyperkalemia: CRRT 11/27-12/3.  Started on intermittent hemodialysis,  first dialysis was on 12/4.  Nephrology following. Patient had 1600 mL urine output last 24 hours, close monitoring to assess need for intermittent dialysis.  Acute hypoxemic respiratory failure secondary to fluid overload: Successfully weaned to room air.  Acute metabolic encephalopathy: Stabilizing.  Hyperglycemia: Blood sugar stable.  Currently on SSI.     DVT prophylaxis: Place and maintain sequential compression device Start: 11/02/20 0814   Code Status: Full code Family Communication: None Disposition Plan: Status is: Inpatient  Remains inpatient appropriate because:Inpatient level of care appropriate due to severity of illness   Dispo: The patient is from: SNF              Anticipated d/c is to: SNF              Anticipated d/c date is: 3 days              Patient currently is not medically stable to d/c.  Patient with Enterococcus faecalis bacteremia, will need IV antibiotics until 12/25. Dialysis need has to be determined.  If patient needs dialysis, he will need permacath and tunneled CVC for antibiotics to be discharged to nursing home.   Consultants:   Urology  Nephrology  [facility] radiology  Infectious disease  Procedures:   None  Antimicrobials:  Antibiotics Given (last 72 hours)    Date/Time Action Medication Dose Rate   11/05/20 2247 New Bag/Given   ampicillin (OMNIPEN) 2 g in sodium chloride 0.9 % 100 mL IVPB 2 g 300 mL/hr   11/06/20 0854 New Bag/Given   ampicillin (OMNIPEN) 2 g in sodium chloride 0.9 % 100 mL IVPB 2 g 300 mL/hr   11/06/20 2149 New Bag/Given   ampicillin (OMNIPEN) 2 g in sodium chloride 0.9 %  100 mL IVPB 2 g 300 mL/hr   11/07/20 1302 New Bag/Given   ampicillin (OMNIPEN) 2 g in sodium chloride 0.9 % 100 mL IVPB 2 g 300 mL/hr   11/07/20 2303 New Bag/Given   ampicillin (OMNIPEN) 2 g in sodium chloride 0.9 % 100 mL IVPB 2 g 300 mL/hr   11/08/20 1013 New Bag/Given   ampicillin (OMNIPEN) 2 g in sodium chloride 0.9 % 100 mL IVPB 2 g  300 mL/hr         Subjective: Patient seen and examined.  Poor historian.  Denies any overnight complaints.  Patient himself denies any problems.  He is bedbound.  Does not walk.  Afebrile overnight.  Objective: Vitals:   11/08/20 0400 11/08/20 0730 11/08/20 0741 11/08/20 1200  BP: 112/71 116/78 120/87 133/79  Pulse: 100 (!) 110 (!) 111   Resp: (!) 21 (!) 23 (!) 23 20  Temp: 98.4 F (36.9 C) 99.9 F (37.7 C)  98.4 F (36.9 C)  TempSrc: Oral Oral  Oral  SpO2: 97% 96% 93%   Weight:      Height:        Intake/Output Summary (Last 24 hours) at 11/08/2020 1428 Last data filed at 11/08/2020 1300 Gross per 24 hour  Intake 500 ml  Output 1200 ml  Net -700 ml   Filed Weights   11/06/20 1430 11/06/20 1805 11/07/20 0402  Weight: 125 kg 124 kg 119.6 kg    Examination:  General exam: Chronically sick looking elderly gentleman, older than his stated age lying in bed. Respiratory system: Clear to auscultation. Respiratory effort normal.  No added sounds. He has a temporary catheter right IJ. Cardiovascular system: S1 & S2 heard, RRR.  He has 1+ edema bilateral lower extremity.  He also has some anasarca.   Gastrointestinal system: Abdomen is nondistended, soft and nontender. No organomegaly or masses felt. Normal bowel sounds heard. Central nervous system: Alert and oriented.  Generalized weakness.  No focal deficit.  His right side is probably more weaker than left. Psychiatry: Judgement and insight appear normal. Mood & affect flat.    Data Reviewed: I have personally reviewed following labs and imaging studies  CBC: Recent Labs  Lab 11/03/20 0505 11/03/20 0505 11/04/20 0304 11/05/20 0314 11/06/20 0324 11/06/20 1839 11/07/20 0407  WBC 10.7*   < > 8.5 9.7 9.8 9.8 8.5  NEUTROABS 8.7*  --  5.9 6.3 6.8  --  6.0  HGB 9.3*   < > 9.4* 9.5* 8.3* 8.6* 8.3*  HCT 29.8*   < > 29.6* 29.9* 26.1* 26.8* 26.7*  MCV 85.4   < > 85.8 85.7 85.3 85.1 86.7  PLT 68*   < > 82* 102* 148* 167  191   < > = values in this interval not displayed.   Basic Metabolic Panel: Recent Labs  Lab 11/03/20 0505 11/03/20 1543 11/04/20 0304 11/04/20 1634 11/05/20 0314 11/05/20 0314 11/05/20 1600 11/06/20 0324 11/06/20 1839 11/07/20 0407 11/07/20 1606  NA 138   < > 137  138   < > 136   < > 136 134* 137 137 138  K 3.4*   < > 3.8  3.8   < > 3.8   < > 3.7 3.5 3.8 3.7 3.7  CL 102   < > 101  103   < > 99   < > 100 99 98 99 100  CO2 23   < > 24  24   < > 24   < > 23  24 26 27 25   GLUCOSE 109*   < > 108*  108*   < > 96   < > 179* 145* 129* 114* 124*  BUN 26*   < > 26*  27*   < > 23   < > 31* 41* 23 32* 33*  CREATININE 1.50*   < > 1.54*  1.53*   < > 1.63*   < > 2.15* 2.89* 1.95* 2.61* 3.09*  CALCIUM 8.0*   < > 8.1*  8.1*   < > 8.2*   < > 8.0* 8.0* 8.1* 8.2* 8.2*  MG 2.5*  --  2.4  --  2.5*  --   --  2.4  --  2.3  --   PHOS 2.6   < > 2.6   < > 2.1*  --  2.2*  --  2.2* 2.9 3.7   < > = values in this interval not displayed.   GFR: Estimated Creatinine Clearance: 34 mL/min (A) (by C-G formula based on SCr of 3.09 mg/dL (H)). Liver Function Tests: Recent Labs  Lab 11/02/20 0310 11/02/20 1544 11/03/20 0505 11/03/20 1543 11/04/20 0304 11/04/20 1634 11/05/20 0314 11/05/20 0314 11/05/20 1600 11/06/20 0324 11/06/20 1839 11/07/20 0407 11/07/20 1606  AST 50*  --  43*  --  57*  --  76*  --   --  58*  --   --   --   ALT 40  --  37  --  47*  --  64*  --   --  54*  --   --   --   ALKPHOS 54  --  54  --  67  --  85  --   --  86  --   --   --   BILITOT 0.7  --  0.5  --  0.7  --  1.4*  --   --  1.0  --   --   --   PROT 6.2*  --  6.4*  --  6.6  --  6.6  --   --  6.0*  --   --   --   ALBUMIN 2.6*   < > 2.6*   < > 2.7*  2.7*   < > 2.6*   < > 2.4* 2.4* 2.9* 2.6* 2.7*   < > = values in this interval not displayed.   No results for input(s): LIPASE, AMYLASE in the last 168 hours. No results for input(s): AMMONIA in the last 168 hours. Coagulation Profile: Recent Labs  Lab 11/02/20 0813   INR 1.2   Cardiac Enzymes: No results for input(s): CKTOTAL, CKMB, CKMBINDEX, TROPONINI in the last 168 hours. BNP (last 3 results) No results for input(s): PROBNP in the last 8760 hours. HbA1C: Recent Labs    11/07/20 0407  HGBA1C 6.4*   CBG: Recent Labs  Lab 11/07/20 1208 11/07/20 1718 11/07/20 2226 11/08/20 0729 11/08/20 1159  GLUCAP 124* 116* 130* 113* 108*   Lipid Profile: No results for input(s): CHOL, HDL, LDLCALC, TRIG, CHOLHDL, LDLDIRECT in the last 72 hours. Thyroid Function Tests: No results for input(s): TSH, T4TOTAL, FREET4, T3FREE, THYROIDAB in the last 72 hours. Anemia Panel: No results for input(s): VITAMINB12, FOLATE, FERRITIN, TIBC, IRON, RETICCTPCT in the last 72 hours. Sepsis Labs: No results for input(s): PROCALCITON, LATICACIDVEN in the last 168 hours.  Recent Results (from the past 240 hour(s))  Culture, blood (Routine x 2)     Status: Abnormal   Collection Time: 10/30/20 12:09 AM  Specimen: BLOOD LEFT ARM  Result Value Ref Range Status   Specimen Description   Final    BLOOD LEFT ARM Performed at [facility], [address], [place], [place] [address]    Special Requests   Final    BOTTLES DRAWN AEROBIC AND ANAEROBIC Blood Culture adequate volume Performed at [facility], [address], [place], [place] [address]    Culture  Setup Time   Final    GRAM POSITIVE COCCI IN BOTH AEROBIC AND ANAEROBIC BOTTLES Gram Stain Report Called to,Read Back By and Verified With: SMITH MD @ 1619 ON [phone] BY HENDERSON L. Organism ID to follow CRITICAL RESULT CALLED TO, READ BACK BY AND VERIFIED WITH: PHRMD L SEAY @0153  10/31/20 BY S GEZAHEGN    Culture (A)  Final    ENTEROCOCCUS FAECALIS SUSCEPTIBILITIES PERFORMED ON PREVIOUS CULTURE WITHIN THE LAST 5 DAYS. Performed at [facility], [place] [address], [place], [place] [address]    Report Status 11/02/2020 FINAL  Final  Blood Culture ID Panel (Reflexed)     Status: Abnormal   Collection Time:  10/30/20 12:09 AM  Result Value Ref Range Status   Enterococcus faecalis DETECTED (A) NOT DETECTED Final    Comment: CRITICAL RESULT CALLED TO, READ BACK BY AND VERIFIED WITH: PHRMD L SEAY @0153  10/31/20 BY S GEZAHEGN    Enterococcus Faecium NOT DETECTED NOT DETECTED Final   Listeria monocytogenes NOT DETECTED NOT DETECTED Final   Staphylococcus species NOT DETECTED NOT DETECTED Final   Staphylococcus aureus (BCID) NOT DETECTED NOT DETECTED Final   Staphylococcus epidermidis NOT DETECTED NOT DETECTED Final   Staphylococcus lugdunensis NOT DETECTED NOT DETECTED Final   Streptococcus species NOT DETECTED NOT DETECTED Final   Streptococcus agalactiae NOT DETECTED NOT DETECTED Final   Streptococcus pneumoniae NOT DETECTED NOT DETECTED Final   Streptococcus pyogenes NOT DETECTED NOT DETECTED Final   A.calcoaceticus-baumannii NOT DETECTED NOT DETECTED Final   Bacteroides fragilis NOT DETECTED NOT DETECTED Final   Enterobacterales NOT DETECTED NOT DETECTED Final   Enterobacter cloacae complex NOT DETECTED NOT DETECTED Final   Escherichia coli NOT DETECTED NOT DETECTED Final   Klebsiella aerogenes NOT DETECTED NOT DETECTED Final   Klebsiella oxytoca NOT DETECTED NOT DETECTED Final   Klebsiella pneumoniae NOT DETECTED NOT DETECTED Final   Proteus species NOT DETECTED NOT DETECTED Final   Salmonella species NOT DETECTED NOT DETECTED Final   Serratia marcescens NOT DETECTED NOT DETECTED Final   Haemophilus influenzae NOT DETECTED NOT DETECTED Final   Neisseria meningitidis NOT DETECTED NOT DETECTED Final   Pseudomonas aeruginosa NOT DETECTED NOT DETECTED Final   Stenotrophomonas maltophilia NOT DETECTED NOT DETECTED Final   Candida albicans NOT DETECTED NOT DETECTED Final   Candida auris NOT DETECTED NOT DETECTED Final   Candida glabrata NOT DETECTED NOT DETECTED Final   Candida krusei NOT DETECTED NOT DETECTED Final   Candida parapsilosis NOT DETECTED NOT DETECTED Final   Candida tropicalis  NOT DETECTED NOT DETECTED Final   Cryptococcus neoformans/gattii NOT DETECTED NOT DETECTED Final   Vancomycin resistance NOT DETECTED NOT DETECTED Final    Comment: Performed at [facility], [address], [place], [place] [address]  Culture, blood (Routine x 2)     Status: Abnormal   Collection Time: 10/30/20  1:04 AM   Specimen: BLOOD LEFT ARM  Result Value Ref Range Status   Specimen Description   Final    BLOOD LEFT ARM Performed at [facility], [address], [place], [place]  [place]    Special Requests   Final    BOTTLES DRAWN AEROBIC AND ANAEROBIC Blood Culture adequate volume Performed at [facility], [address] [place], [place] [address]    Culture  Setup Time   Final    GRAM POSITIVE COCCI Gram Stain Report Called to,Read Back By and Verified With: DIAZ,A ON 10/30/20 AT 2210 BY LOY,C AEROBIC BOTTLE ONLY Performed at [facility] Performed at [facility], [place] [address] [place], [place] [address]    Culture ENTEROCOCCUS FAECALIS (A)  Final   Report Status 11/02/2020 FINAL  Final   Organism ID, Bacteria ENTEROCOCCUS FAECALIS  Final      Susceptibility   Enterococcus faecalis - MIC*    AMPICILLIN <=2 SENSITIVE Sensitive     VANCOMYCIN 1 SENSITIVE Sensitive     GENTAMICIN SYNERGY SENSITIVE Sensitive     * ENTEROCOCCUS FAECALIS  Resp Panel by RT-PCR (Flu A&B, Covid) Nasopharyngeal Swab     Status: None   Collection Time: 10/30/20  1:14 AM   Specimen: Nasopharyngeal Swab; Nasopharyngeal(NP) swabs in vial transport medium  Result Value Ref Range Status   SARS Coronavirus 2 by RT PCR NEGATIVE NEGATIVE Final    Comment: (NOTE) SARS-CoV-2 target nucleic acids are NOT DETECTED.  The SARS-CoV-2 RNA is generally detectable in upper respiratory specimens during the acute phase of infection. The lowest concentration of SARS-CoV-2 viral copies this assay can detect is 138 copies/mL. A negative result does not preclude SARS-Cov-2 infection and  should not be used as the sole basis for treatment or other patient management decisions. A negative result may occur with  improper specimen collection/handling, submission of specimen other than nasopharyngeal swab, presence of viral mutation(s) within the areas targeted by this assay, and inadequate number of viral copies(<138 copies/mL). A negative result must be combined with clinical observations, patient history, and epidemiological information. The expected result is Negative.  Fact Sheet for Patients:  EntrepreneurPulse.com.au  Fact Sheet for Healthcare Providers:  IncredibleEmployment.be  This test is no t yet approved or cleared by the [place] FDA and  has been authorized for detection and/or diagnosis of SARS-CoV-2 by FDA under an Emergency Use Authorization (EUA). This EUA will remain  in effect (meaning this test can be used) for the duration of the COVID-19 declaration under Section 564(b)(1) of the Act, 21 U.S.C.section 360bbb-3(b)(1), unless the authorization is terminated  or revoked sooner.       Influenza A by PCR NEGATIVE NEGATIVE Final   Influenza B by PCR NEGATIVE NEGATIVE Final    Comment: (NOTE) The Xpert Xpress SARS-CoV-2/FLU/RSV plus assay is intended as an aid in the diagnosis of influenza from Nasopharyngeal swab specimens and should not be used as a sole basis for treatment. Nasal washings and aspirates are unacceptable for Xpert Xpress SARS-CoV-2/FLU/RSV testing.  Fact Sheet for Patients: EntrepreneurPulse.com.au  Fact Sheet for Healthcare Providers: IncredibleEmployment.be  This test is not yet approved or cleared by the [place] FDA and has been authorized for detection and/or diagnosis of SARS-CoV-2 by FDA under an Emergency Use Authorization (EUA). This EUA will remain in effect (meaning this test can be used) for the duration of the COVID-19 declaration  under Section 564(b)(1) of the Act, 21 U.S.C. section 360bbb-3(b)(1), unless the authorization is terminated or revoked.  Performed at [facility], [address] [place], [place] [address]   Urine culture     Status: Abnormal   Collection Time: 10/30/20  4:29 AM   Specimen: Urine,  Clean Catch  Result Value Ref Range Status   Specimen Description   Final    URINE, CLEAN CATCH Performed at [facility], [address], [place], [place] [address]    Special Requests   Final    NONE Performed at [facility], [address], [place], [place] [address]    Culture MULTIPLE SPECIES PRESENT, SUGGEST RECOLLECTION (A)  Final   Report Status 11/02/2020 FINAL  Final  MRSA PCR Screening     Status: None   Collection Time: 10/30/20  9:30 AM   Specimen: Nasal Mucosa; Nasopharyngeal  Result Value Ref Range Status   MRSA by PCR NEGATIVE NEGATIVE Final    Comment:        The GeneXpert MRSA Assay (FDA approved for NASAL specimens only), is one component of a comprehensive MRSA colonization surveillance program. It is not intended to diagnose MRSA infection nor to guide or monitor treatment for MRSA infections. Performed at [facility], [place] [address], [place], [place] [address]   Culture, blood (x 2)     Status: None   Collection Time: 10/30/20 10:35 AM   Specimen: BLOOD RIGHT HAND  Result Value Ref Range Status   Specimen Description BLOOD RIGHT HAND  Final   Special Requests   Final    BOTTLES DRAWN AEROBIC ONLY Blood Culture results may not be optimal due to an inadequate volume of blood received in culture bottles   Culture   Final    NO GROWTH 5 DAYS Performed at [facility], [place] [address], [place], [place] [address]    Report Status 11/04/2020 FINAL  Final  Culture, blood (x 2)     Status: None   Collection Time: 10/30/20 10:50 AM   Specimen: BLOOD RIGHT HAND  Result Value Ref Range Status   Specimen Description BLOOD RIGHT HAND  Final   Special Requests    Final    BOTTLES DRAWN AEROBIC ONLY Blood Culture results may not be optimal due to an inadequate volume of blood received in culture bottles   Culture   Final    NO GROWTH 5 DAYS Performed at [facility], [place] [address], [place], [place] [address]    Report Status 11/04/2020 FINAL  Final  Fungus culture, blood     Status: None   Collection Time: 10/30/20 10:50 AM   Specimen: BLOOD RIGHT HAND  Result Value Ref Range Status   Specimen Description BLOOD RIGHT HAND  Final   Special Requests   Final    BOTTLES DRAWN AEROBIC ONLY Blood Culture results may not be optimal due to an inadequate volume of blood received in culture bottles   Culture   Final    NO GROWTH 7 DAYS NO FUNGUS ISOLATED Performed at [facility], [place] [address], [place], [place] [address]    Report Status 11/06/2020 FINAL  Final         Radiology Studies: US RENAL  Result Date: 11/07/2020 CLINICAL DATA:  Acute kidney injury EXAM: RENAL / URINARY TRACT ULTRASOUND COMPLETE COMPARISON:  CT dated 11/03/2020 FINDINGS: Right Kidney: Renal measurements: 8.3 x 3.4 x 4 cm = volume: 58 mL. The right kidney is atrophic with cortical thinning and increased cortical echogenicity. There is mild right-sided hydronephrosis. Left Kidney: Renal measurements: 12.1 x 6.3 x 4.7 cm = volume: 189 mL. Again noted is a left-sided perinephric/subcapsular hematoma. There is no definite left-sided hydronephrosis. There is increased cortical echogenicity. Bladder: Appears normal for degree of  bladder distention. Other: None. IMPRESSION: 1. Atrophic right kidney with mild collecting system dilatation. 2. Again noted is a left perinephric hematoma. No left-sided hydronephrosis. Electronically Signed   By: Constance Holster M.D.   On: 11/07/2020 19:08        Scheduled Meds: . [START ON 11/09/2020] B-complex with vitamin C  1 tablet Oral Daily  . docusate  100 mg Oral BID  . famotidine  20 mg Oral QHS  . feeding supplement  237 mL  Oral TID BM  . insulin aspart  0-15 Units Subcutaneous TID WC  . insulin aspart  0-5 Units Subcutaneous QHS  . [START ON 11/09/2020] polyethylene glycol  17 g Oral Daily  . sodium chloride flush  10-40 mL Intracatheter Q12H   Continuous Infusions: . sodium chloride 10 mL/hr at 11/07/20 0600  . ampicillin (OMNIPEN) IV 2 g (11/08/20 1013)     LOS: 9 days    Time spent: 35 minutes    Barb Merino, MD [facility] Pager [phone]

## 2020-11-08 NOTE — Progress Notes (Signed)
Sent message to Dr. Barb Merino to please enter a code status for the patient.

## 2020-11-08 NOTE — Progress Notes (Signed)
Dr. Darnell Level prescribed immodium to tx pt. Diarrhea.

## 2020-11-08 NOTE — Progress Notes (Addendum)
[facility] Progress Note   Assessment/ Plan:   1. 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  1. Started on CRRT due to severe oliguric AKI on 11/27/21and stopped 11/05/20 2. Transitioned to IHD on 11/06/20 with some drop in BP but otherwise tolerated it well. 3. Plan was for TTS schedule but UOP yesterday was 1650. Could he be in the diuretic phase of ATN as far as his acute component. Last dialysis 12/4. Will hold off on RRT for now and reassess daily. 4. Renal US showed atrophic right kidney with mildly dilated collecting system, stable left perinephric hematoma, no left sided hydronephrosis. No need for replacement of nephrostomy tube at this time.   2. Severe Septic shock from Enterococcus faecalis bacteremia- presumably urinary source. Abx per ID and primary team.TEE negative for vegetations. 3. 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 IR following. 1. Appreciateurologyinput and will retry placing left PCN in a few days or longer once hydro is present. 2. Starting to make some urine, renal ultrasound showed no hydronephrosis.  4. Left perinephric hematoma- continue to follow H/H and transfuse prn. 5. Anemia of critical illness/ABLA- transfuse prn 6. Thrombocytopenia- likely due to critical illness. Anticoagulation on hold. Platelets improving. 7. Hyperkalemia- improved with CRRT initiation on 10/30/20.  8. Hypophosphatemia- follow and replace as needed. 9. Acute hypoxemic respiratory failure-extubated and doing well 10. AMS- EEGnonspecific. improved  Subjective:   Patient reports  feeling okay today, denies any fevers, chills, nausea, vomiting, chest pain, or SOB. He reports urinating well.    Objective:   BP 133/79 (BP Location: Left Arm)   Pulse (!) 111   Temp 98.4 F (36.9 C) (Oral)   Resp 20   Ht 6\' 1"  (1.854 m)   Wt 119.6 kg   SpO2 93%   BMI 34.79 kg/m   Intake/Output Summary (Last 24 hours) at 11/08/2020 1212 Last data filed at 11/08/2020 0954 Gross per 24 hour  Intake 250 ml  Output 1200 ml  Net -950 ml   Weight change:   Physical Exam: General: Middle aged male, NAD HEENT: Left IJ in place Cardiac: RRR, no m/r/g Pulmonology: CTABL, no wheezing, rhonchi, rale Extremity: Trace bilateral LE edema, sacral edema noted  Imaging: US RENAL  Result Date: 11/07/2020 CLINICAL DATA:  Acute kidney injury EXAM: RENAL / URINARY TRACT ULTRASOUND COMPLETE COMPARISON:  CT dated 11/03/2020 FINDINGS: Right Kidney: Renal measurements: 8.3 x 3.4 x 4 cm = volume: 58 mL. The right kidney is atrophic with cortical thinning and increased cortical echogenicity. There is mild right-sided hydronephrosis. Left Kidney: Renal measurements: 12.1 x 6.3 x 4.7 cm = volume: 189 mL. Again noted is a left-sided perinephric/subcapsular hematoma. There is no definite left-sided hydronephrosis. There is increased cortical echogenicity. Bladder: Appears normal for degree of bladder distention. Other: None. IMPRESSION: 1. Atrophic right kidney with mild collecting system dilatation. 2. Again noted is a left perinephric hematoma. No left-sided hydronephrosis. Electronically Signed   By: Constance Holster M.D.   On: 11/07/2020 19:08    Labs: BMET Recent Labs  Lab 11/04/20 0304 11/04/20 0304 11/04/20 1634 11/05/20 0314 11/05/20 1600 11/06/20 0324 11/06/20 1839 11/07/20 0407 11/07/20 1606  NA 137  138   < > 140 136  136 134* 137 137 138  K 3.8  3.8   < > 4.3 3.8 3.7 3.5 3.8 3.7 3.7  CL 101  103   < > 102 99 100 99 98 99 100  CO2 24  24   < > 24 24 23 24 26 27 25   GLUCOSE 108*   108*   < > 124* 96 179* 145* 129* 114* 124*  BUN 26*  27*   < > 22 23 31* 41* 23 32* 33*  CREATININE 1.54*  1.53*   < > 1.47* 1.63* 2.15* 2.89* 1.95* 2.61* 3.09*  CALCIUM 8.1*  8.1*   < > 8.3* 8.2* 8.0* 8.0* 8.1* 8.2* 8.2*  PHOS 2.6  --  2.5 2.1* 2.2*  --  2.2* 2.9 3.7   < > = values in this interval not displayed.   CBC Recent Labs  Lab 11/04/20 0304 11/04/20 0304 11/05/20 0314 11/06/20 0324 11/06/20 1839 11/07/20 0407  WBC 8.5   < > 9.7 9.8 9.8 8.5  NEUTROABS 5.9  --  6.3 6.8  --  6.0  HGB 9.4*   < > 9.5* 8.3* 8.6* 8.3*  HCT 29.6*   < > 29.9* 26.1* 26.8* 26.7*  MCV 85.8   < > 85.7 85.3 85.1 86.7  PLT 82*   < > 102* 148* 167 191   < > = values in this interval not displayed.    Medications:    . [START ON 11/09/2020] B-complex with vitamin C  1 tablet Oral Daily  . docusate  100 mg Oral BID  . famotidine  20 mg Oral QHS  . feeding supplement  237 mL Oral TID BM  . insulin aspart  0-15 Units Subcutaneous TID WC  . insulin aspart  0-5 Units Subcutaneous QHS  . [START ON 11/09/2020] polyethylene glycol  17 g Oral Daily  . sodium chloride flush  10-40 mL Intracatheter Q12H     Asencion Noble, M.D. PGY3 11/08/2020 12:12 PM  I have seen and examined this patient and agree with the plan of care. UOP picking up; as long as there are no absolute indications would like to hold off on RRT. He has no uremic sxs.  Dwana Melena, MD 11/08/2020, 1:44 PM

## 2020-11-08 NOTE — Progress Notes (Signed)
Request to draw blood from [facility] but catheter does not have pigtail, no orders to use for labs.  Wende, RN made aware and will notify lab that they are to draw patient's blood.

## 2020-11-09 DIAGNOSIS — R7881 Bacteremia: Secondary | ICD-10-CM | POA: Diagnosis not present

## 2020-11-09 DIAGNOSIS — B952 Enterococcus as the cause of diseases classified elsewhere: Secondary | ICD-10-CM | POA: Diagnosis not present

## 2020-11-09 LAB — CBC WITH DIFFERENTIAL/PLATELET
Abs Immature Granulocytes: 0.04 10*3/uL (ref 0.00–0.07)
Basophils Absolute: 0 10*3/uL (ref 0.0–0.1)
Basophils Relative: 0 %
Eosinophils Absolute: 0.1 10*3/uL (ref 0.0–0.5)
Eosinophils Relative: 2 %
HCT: 26.6 % — ABNORMAL LOW (ref 39.0–52.0)
Hemoglobin: 8.6 g/dL — ABNORMAL LOW (ref 13.0–17.0)
Immature Granulocytes: 1 %
Lymphocytes Relative: 12 %
Lymphs Abs: 0.8 10*3/uL (ref 0.7–4.0)
MCH: 27 pg (ref 26.0–34.0)
MCHC: 32.3 g/dL (ref 30.0–36.0)
MCV: 83.6 fL (ref 80.0–100.0)
Monocytes Absolute: 0.5 10*3/uL (ref 0.1–1.0)
Monocytes Relative: 7 %
Neutro Abs: 5.2 10*3/uL (ref 1.7–7.7)
Neutrophils Relative %: 78 %
Platelets: 309 10*3/uL (ref 150–400)
RBC: 3.18 MIL/uL — ABNORMAL LOW (ref 4.22–5.81)
RDW: 17.3 % — ABNORMAL HIGH (ref 11.5–15.5)
WBC: 6.6 10*3/uL (ref 4.0–10.5)
nRBC: 0 % (ref 0.0–0.2)

## 2020-11-09 LAB — RENAL FUNCTION PANEL
Albumin: 2.3 g/dL — ABNORMAL LOW (ref 3.5–5.0)
Anion gap: 11 (ref 5–15)
BUN: 40 mg/dL — ABNORMAL HIGH (ref 8–23)
CO2: 24 mmol/L (ref 22–32)
Calcium: 8.3 mg/dL — ABNORMAL LOW (ref 8.9–10.3)
Chloride: 104 mmol/L (ref 98–111)
Creatinine, Ser: 3.69 mg/dL — ABNORMAL HIGH (ref 0.61–1.24)
GFR, Estimated: 18 mL/min — ABNORMAL LOW (ref >60)
Glucose, Bld: 166 mg/dL — ABNORMAL HIGH (ref 70–99)
Phosphorus: 3.9 mg/dL (ref 2.5–4.6)
Potassium: 3.8 mmol/L (ref 3.5–5.1)
Sodium: 139 mmol/L (ref 135–145)

## 2020-11-09 LAB — GLUCOSE, CAPILLARY
Glucose-Capillary: 112 mg/dL — ABNORMAL HIGH (ref 70–99)
Glucose-Capillary: 108 mg/dL — ABNORMAL HIGH (ref 70–99)
Glucose-Capillary: 139 mg/dL — ABNORMAL HIGH (ref 70–99)
Glucose-Capillary: 113 mg/dL — ABNORMAL HIGH (ref 70–99)

## 2020-11-09 LAB — MAGNESIUM: Magnesium: 2.2 mg/dL (ref 1.7–2.4)

## 2020-11-09 NOTE — Progress Notes (Signed)
MEWS turned to yellow due to HR of 102 and respiration of 25.  Patient is not in any acute distress.  He denied pain stating "I am fine. Yellow MEWS protocol implemented. He is in bed resting.  Will continue to monitor

## 2020-11-09 NOTE — Progress Notes (Signed)
MEWS turned yellow due to HR of 101 and RR of 25.  Patient is not in any acute distress.  Yellow MEWS protocol initiated.  Will continue to monitor

## 2020-11-09 NOTE — Progress Notes (Signed)
   11/08/20 1056  Assess: MEWS Score  Level of Consciousness Alert  Assess: MEWS Score  MEWS Temp 0  MEWS Systolic 0  MEWS Pulse 2  MEWS RR 1  MEWS LOC 0  MEWS Score 3  MEWS Score Color Yellow  Assess: if the MEWS score is Yellow or Red  Were vital signs taken at a resting state? Yes  Focused Assessment No change from prior assessment  Early Detection of Sepsis Score *See Row Information* Low  MEWS guidelines implemented *See Row Information* Yes  Treat  Pain Scale 0-10  Pain Score 0  Take Vital Signs  Increase Vital Sign Frequency  Yellow: Q 2hr X 2 then Q 4hr X 2, if remains yellow, continue Q 4hrs  Escalate  MEWS: Escalate Yellow: discuss with charge nurse/RN and consider discussing with provider and RRT  Notify: Charge Nurse/RN  Name of Charge Nurse/RN Notified Kristen  Date Charge Nurse/RN Notified 11/08/20  Time Charge Nurse/RN Notified [id number]  Document  Patient Outcome Stabilized after interventions;Other (Comment)

## 2020-11-09 NOTE — Plan of Care (Signed)
  Problem: Safety: Goal: Ability to remain free from injury will improve Outcome: Progressing   Problem: Education: Goal: Knowledge of General Education information will improve Description: Including pain rating scale, medication(s)/side effects and non-pharmacologic comfort measures Outcome: Progressing   Problem: Health Behavior/Discharge Planning: Goal: Ability to manage health-related needs will improve Outcome: Progressing   Problem: Pain Managment: Goal: General experience of comfort will improve Outcome: Progressing   Problem: Safety: Goal: Ability to remain free from injury will improve Outcome: Progressing

## 2020-11-09 NOTE — Progress Notes (Addendum)
[facility] Progress Note   Assessment/ Plan:   1. 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  1. Started on CRRT due to severe oliguric AKI on 11/27/21and stopped 11/05/20 2. Transitioned to IHD on 11/06/20 with some drop in BP but otherwise tolerated it well. 3. Renal US showed atrophic right kidney with mildly dilated collecting system, stable left perinephric hematoma, no left sided hydronephrosis. No need for replacement of nephrostomy tube at this time. 4. Plan was for TTS schedule but UOP  was 1,200 amd 1500 before having poor output for past few days. But patient reports a large amount of UOP and by weights it would support. For now, he is not symptomatic; we will continue to hold RRT and eval daily. Hopefully the azotemia stabilizes and reverses.    2. Severe Septic shock from Enterococcus faecalis bacteremia- presumably urinary source. Abx per ID and primary team.TEE negative for vegetations. 3. 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 IR following. 1. Appreciateurologyinput and will retry placing left PCN in a few days or longer once hydro is present. 2. Starting to make some urine, renal ultrasound showed no hydronephrosis.  4. Left perinephric hematoma- continue to follow H/H and transfuse prn. 5. Anemia of critical illness/ABLA- transfuse prn 6. Thrombocytopenia- likely due to critical illness. Anticoagulation on hold. Platelets improving. 7. Hyperkalemia- improved with CRRT initiation on 10/30/20.  8. Hypophosphatemia- follow and replace as needed. 9. Acute hypoxemic respiratory  failure-extubated and doing well 10. AMS- EEGnonspecific. improved  I have seen and examined this patient and agree with the plan of care.   Dwana Melena, MD 11/09/2020, 12:56 PM   Subjective:   Patient reports doing well today, denies any new complaints. Reports that he feels like he has been urinating a lot today. Denies any fevers, chills, nausea, vomiting, headaches, chest pain, or SOB.   Objective:   BP 122/67 (BP Location: Left Arm)   Pulse (!) 106   Temp 99.5 F (37.5 C) (Oral)   Resp 20   Ht 6\' 1"  (1.854 m)   Wt 117.1 kg   SpO2 98%   BMI 34.06 kg/m   Intake/Output Summary (Last 24 hours) at 11/09/2020 2505 Last data filed at 11/09/2020 0208 Gross per 24 hour  Intake 870.36 ml  Output 400 ml  Net 470.36 ml   Weight change:   Physical Exam: General: Middle aged male, NAD, laying in bed HEENT: Left IJ in place Cardiac: RRR, no m/r/g Pulmonary: CTABL, no wheezing, rhonchi, rales Extremity: Trace BL pitting edema, sacral edema noted  Imaging: US RENAL  Result Date: 11/07/2020 CLINICAL DATA:  Acute kidney injury EXAM: RENAL / URINARY TRACT ULTRASOUND COMPLETE COMPARISON:  CT dated 11/03/2020 FINDINGS: Right Kidney: Renal measurements: 8.3 x 3.4 x 4 cm = volume: 58 mL. The right kidney is atrophic with cortical thinning and increased cortical echogenicity. There is mild right-sided hydronephrosis. Left Kidney: Renal measurements: 12.1 x 6.3 x 4.7 cm = volume: 189 mL. Again noted is a left-sided perinephric/subcapsular hematoma. There is no definite left-sided hydronephrosis. There is increased cortical echogenicity. Bladder: Appears normal for degree of bladder distention. Other: None. IMPRESSION: 1. Atrophic right kidney with mild collecting system dilatation. 2. Again noted is a  left perinephric hematoma. No left-sided hydronephrosis. Electronically Signed   By: Constance Holster M.D.   On: 11/07/2020 19:08    Labs: BMET Recent Labs  Lab 11/05/20 0314 11/05/20  0314 11/05/20 1600 11/06/20 0324 11/06/20 1839 11/07/20 0407 11/07/20 1606 11/08/20 1540 11/09/20 0026  NA 136   < > 136 134* 137 137 138 138 139  K 3.8   < > 3.7 3.5 3.8 3.7 3.7 3.8 3.8  CL 99   < > 100 99 98 99 100 103 104  CO2 24   < > 23 24 26 27 25 25 24   GLUCOSE 96   < > 179* 145* 129* 114* 124* 129* 166*  BUN 23   < > 31* 41* 23 32* 33* 40* 40*  CREATININE 1.63*   < > 2.15* 2.89* 1.95* 2.61* 3.09* 3.71* 3.69*  CALCIUM 8.2*   < > 8.0* 8.0* 8.1* 8.2* 8.2* 8.4* 8.3*  PHOS 2.1*  --  2.2*  --  2.2* 2.9 3.7 4.2 3.9   < > = values in this interval not displayed.   CBC Recent Labs  Lab 11/06/20 0324 11/06/20 0324 11/06/20 1839 11/07/20 0407 11/08/20 1540 11/09/20 0026  WBC 9.8   < > 9.8 8.5 7.5 6.6  NEUTROABS 6.8  --   --  6.0 5.8 5.2  HGB 8.3*   < > 8.6* 8.3* 8.8* 8.6*  HCT 26.1*   < > 26.8* 26.7* 27.3* 26.6*  MCV 85.3   < > 85.1 86.7 83.7 83.6  PLT 148*   < > 167 191 297 309   < > = values in this interval not displayed.    Medications:    . B-complex with vitamin C  1 tablet Oral Daily  . docusate  100 mg Oral BID  . famotidine  20 mg Oral QHS  . feeding supplement  237 mL Oral TID BM  . insulin aspart  0-15 Units Subcutaneous TID WC  . insulin aspart  0-5 Units Subcutaneous QHS  . polyethylene glycol  17 g Oral Daily  . sodium chloride flush  10-40 mL Intracatheter Q12H     Asencion Noble, M.D. [facility] 11/09/2020 8:37 AM

## 2020-11-09 NOTE — Progress Notes (Signed)
PROGRESS NOTE    Brendan [name]  VHQ:[id number] DOB: June 30, 1961 DOA: 10/29/2020 PCP: Cleda Mccreedy, MD    Brief Narrative:  59 year old gentleman with history of stage IV chronic kidney disease due to obstructive uropathy from idiopathic retroperitoneal fibrosis on CellCept, history of bilateral percutaneous nephrostomy tube and currently has a left PCN presented to outside hospital emergency room with nausea, vomiting fever and blood on his left nephrostomy tube.  He was found to have hematoma around left kidney.  In the emergency room, code sepsis was called due to concern for infection.  Treated with IV fluids and antibiotics.  CT scan abdomen pelvis revealed left-sided tube was withdrawn and there was prominent left renal hematoma.  Patient was tachycardic and temperature 104.  Patient was admitted to ICU, was given protamine 50 mg to reverse his heparin subcu apparently used at nursing home. Patient is long-term nursing home resident, has history of extensive DVT previously treated with Lovenox, unable to place IVC filter because of extensive DVT on IVC, unable to tolerate Lovenox.  Also developed subdural hematoma status post craniotomy on 01/26/2020. He has a stroke with right-sided weakness. Blood cultures grew Enterococcus faecalis.   Assessment & Plan:   Principal Problem:   Enterococcal bacteremia Active Problems:   Septic shock ([place])   AKI (acute kidney injury) ([place])   Retroperitoneal hematoma  Septic shock secondary E faecalis bacteremia, likely source urine due to obstructive uropathy. Repeat cultures negative.  Urine cultures negative. ID recommended ampicillin until 12/25.  Left perinephric hematoma/retroperitoneal fibrosis: IR and urology following.  Plan for replacement of nephrostomy tube, however noted to have no adequate hydronephrosis for safe placement.   Currently does not have any hydronephrosis.  Acute renal failure with hyperkalemia: CRRT 11/27-12/3. Started on  intermittent hemodialysis, first dialysis was on 12/4.  Nephrology following. Urine output not recorded well, strict intake and output monitoring required. Nephrology holding off on dialysis.  Acute hypoxemic respiratory failure secondary to fluid overload: Successfully weaned to room air.  Acute metabolic encephalopathy: Stabilizing.  Hyperglycemia: Blood sugar stable.  Currently on SSI.  DVT prophylaxis: Place and maintain sequential compression device Start: 11/02/20 0814   Code Status: Full code Family Communication: sister called and updated. Disposition Plan: Status is: Inpatient  Remains inpatient appropriate because:Inpatient level of care appropriate due to severity of illness   Dispo: The patient is from: SNF              Anticipated d/c is to: SNF              Anticipated d/c date is: 3 days              Patient currently is not medically stable to d/c.  Patient with Enterococcus faecalis bacteremia, will need IV antibiotics until 12/25. Dialysis need has to be determined.  If patient needs dialysis, he will need permacath and tunneled CVC for antibiotics to be discharged to nursing home.   Consultants:   Urology  Nephrology  [facility] radiology  Infectious disease  Procedures:   None  Antimicrobials:  Antibiotics Given (last 72 hours)    Date/Time Action Medication Dose Rate   11/06/20 2149 New Bag/Given   ampicillin (OMNIPEN) 2 g in sodium chloride 0.9 % 100 mL IVPB 2 g 300 mL/hr   11/07/20 1302 New Bag/Given   ampicillin (OMNIPEN) 2 g in sodium chloride 0.9 % 100 mL IVPB 2 g 300 mL/hr   11/07/20 2303 New Bag/Given   ampicillin (OMNIPEN) 2 g in sodium  chloride 0.9 % 100 mL IVPB 2 g 300 mL/hr   11/08/20 1013 New Bag/Given   ampicillin (OMNIPEN) 2 g in sodium chloride 0.9 % 100 mL IVPB 2 g 300 mL/hr   11/08/20 2149 New Bag/Given   ampicillin (OMNIPEN) 2 g in sodium chloride 0.9 % 100 mL IVPB 2 g 300 mL/hr   11/09/20 1000 New Bag/Given   ampicillin  (OMNIPEN) 2 g in sodium chloride 0.9 % 100 mL IVPB 2 g 300 mL/hr         Subjective: Patient seen and examined.  He was more comfortable today.  Was able to keep up conversation. Patient stated diarrhea improved. Urine output not measured, 250 mL recorded.  Objective: Vitals:   11/09/20 0500 11/09/20 0643 11/09/20 0805 11/09/20 1141  BP:  107/71 122/67 119/74  Pulse:  (!) 101 (!) 106 (!) 102  Resp:  (!) 23 20 20   Temp:  98.8 F (37.1 C) 99.5 F (37.5 C) 98.9 F (37.2 C)  TempSrc:  Oral Oral Oral  SpO2:  99% 98% 98%  Weight: 117.1 kg     Height:        Intake/Output Summary (Last 24 hours) at 11/09/2020 1423 Last data filed at 11/09/2020 1213 Gross per 24 hour  Intake 670.36 ml  Output 250 ml  Net 420.36 ml   Filed Weights   11/06/20 1805 11/07/20 0402 11/09/20 0500  Weight: 124 kg 119.6 kg 117.1 kg    Examination:  General exam: Chronically sick looking elderly gentleman, older than his stated age lying in bed. Respiratory system: Clear to auscultation. Respiratory effort normal.  No added sounds. He has a temporary catheter right IJ. Cardiovascular system: S1 & S2 heard, RRR.  He has 1+ edema bilateral lower extremity.  He also has some anasarca.   And scrotal edema. Gastrointestinal system: Abdomen is nondistended, soft and nontender. No organomegaly or masses felt. Normal bowel sounds heard. Central nervous system: Alert and oriented.  Generalized weakness.  No focal deficit.  His right side is probably more weaker than left. Psychiatry: Judgement and insight appear normal. Mood & affect flat.    Data Reviewed: I have personally reviewed following labs and imaging studies  CBC: Recent Labs  Lab 11/05/20 0314 11/05/20 0314 11/06/20 0324 11/06/20 1839 11/07/20 0407 11/08/20 1540 11/09/20 0026  WBC 9.7   < > 9.8 9.8 8.5 7.5 6.6  NEUTROABS 6.3  --  6.8  --  6.0 5.8 5.2  HGB 9.5*   < > 8.3* 8.6* 8.3* 8.8* 8.6*  HCT 29.9*   < > 26.1* 26.8* 26.7* 27.3*  26.6*  MCV 85.7   < > 85.3 85.1 86.7 83.7 83.6  PLT 102*   < > 148* 167 191 297 309   < > = values in this interval not displayed.   Basic Metabolic Panel: Recent Labs  Lab 11/05/20 0314 11/05/20 1600 11/06/20 0324 11/06/20 0324 11/06/20 1839 11/07/20 0407 11/07/20 1606 11/08/20 1540 11/09/20 0026  NA 136   < > 134*   < > 137 137 138 138 139  K 3.8   < > 3.5   < > 3.8 3.7 3.7 3.8 3.8  CL 99   < > 99   < > 98 99 100 103 104  CO2 24   < > 24   < > 26 27 25 25 24   GLUCOSE 96   < > 145*   < > 129* 114* 124* 129* 166*  BUN 23   < >  41*   < > 23 32* 33* 40* 40*  CREATININE 1.63*   < > 2.89*   < > 1.95* 2.61* 3.09* 3.71* 3.69*  CALCIUM 8.2*   < > 8.0*   < > 8.1* 8.2* 8.2* 8.4* 8.3*  MG 2.5*  --  2.4  --   --  2.3  --  2.4 2.2  PHOS 2.1*   < >  --   --  2.2* 2.9 3.7 4.2 3.9   < > = values in this interval not displayed.   GFR: Estimated Creatinine Clearance: 28.2 mL/min (A) (by C-G formula based on SCr of 3.69 mg/dL (H)). Liver Function Tests: Recent Labs  Lab 11/03/20 0505 11/03/20 1543 11/04/20 0304 11/04/20 1634 11/05/20 0314 11/05/20 1600 11/06/20 0324 11/06/20 0324 11/06/20 1839 11/07/20 0407 11/07/20 1606 11/08/20 1540 11/09/20 0026  AST 43*  --  57*  --  76*  --  58*  --   --   --   --   --   --   ALT 37  --  47*  --  64*  --  54*  --   --   --   --   --   --   ALKPHOS 54  --  67  --  85  --  86  --   --   --   --   --   --   BILITOT 0.5  --  0.7  --  1.4*  --  1.0  --   --   --   --   --   --   PROT 6.4*  --  6.6  --  6.6  --  6.0*  --   --   --   --   --   --   ALBUMIN 2.6*   < > 2.7*  2.7*   < > 2.6*   < > 2.4*   < > 2.9* 2.6* 2.7* 2.4* 2.3*   < > = values in this interval not displayed.   No results for input(s): LIPASE, AMYLASE in the last 168 hours. No results for input(s): AMMONIA in the last 168 hours. Coagulation Profile: No results for input(s): INR, PROTIME in the last 168 hours. Cardiac Enzymes: No results for input(s): CKTOTAL, CKMB, CKMBINDEX,  TROPONINI in the last 168 hours. BNP (last 3 results) No results for input(s): PROBNP in the last 8760 hours. HbA1C: Recent Labs    11/07/20 0407  HGBA1C 6.4*   CBG: Recent Labs  Lab 11/08/20 2102 11/08/20 2354 11/09/20 0452 11/09/20 0803 11/09/20 1139  GLUCAP 114* 158* 112* 108* 139*   Lipid Profile: No results for input(s): CHOL, HDL, LDLCALC, TRIG, CHOLHDL, LDLDIRECT in the last 72 hours. Thyroid Function Tests: No results for input(s): TSH, T4TOTAL, FREET4, T3FREE, THYROIDAB in the last 72 hours. Anemia Panel: No results for input(s): VITAMINB12, FOLATE, FERRITIN, TIBC, IRON, RETICCTPCT in the last 72 hours. Sepsis Labs: No results for input(s): PROCALCITON, LATICACIDVEN in the last 168 hours.  No results found for this or any previous visit (from the past 240 hour(s)).       Radiology Studies: US RENAL  Result Date: 11/07/2020 CLINICAL DATA:  Acute kidney injury EXAM: RENAL / URINARY TRACT ULTRASOUND COMPLETE COMPARISON:  CT dated 11/03/2020 FINDINGS: Right Kidney: Renal measurements: 8.3 x 3.4 x 4 cm = volume: 58 mL. The right kidney is atrophic with cortical thinning and increased cortical echogenicity. There is mild right-sided hydronephrosis. Left Kidney: Renal measurements: 12.1  x 6.3 x 4.7 cm = volume: 189 mL. Again noted is a left-sided perinephric/subcapsular hematoma. There is no definite left-sided hydronephrosis. There is increased cortical echogenicity. Bladder: Appears normal for degree of bladder distention. Other: None. IMPRESSION: 1. Atrophic right kidney with mild collecting system dilatation. 2. Again noted is a left perinephric hematoma. No left-sided hydronephrosis. Electronically Signed   By: Constance Holster M.D.   On: 11/07/2020 19:08        Scheduled Meds: . B-complex with vitamin C  1 tablet Oral Daily  . docusate  100 mg Oral BID  . famotidine  20 mg Oral QHS  . feeding supplement  237 mL Oral TID BM  . insulin aspart  0-15 Units  Subcutaneous TID WC  . insulin aspart  0-5 Units Subcutaneous QHS  . polyethylene glycol  17 g Oral Daily  . sodium chloride flush  10-40 mL Intracatheter Q12H   Continuous Infusions: . sodium chloride Stopped (11/07/20 0923)  . ampicillin (OMNIPEN) IV 2 g (11/09/20 1000)     LOS: 10 days    Time spent: 30 minutes    Barb Merino, MD [facility] Pager [phone]

## 2020-11-09 NOTE — Progress Notes (Signed)
  Received call from Dr. Questions pt. Output from last night.  Patient stated that tech told him early this morning is 1500 mL.  Urine outputwas not documented last night.

## 2020-11-09 NOTE — Progress Notes (Signed)
   11/08/20 0741  Assess: MEWS Score  BP 120/87  Pulse Rate (!) 111  ECG Heart Rate (!) 111  Resp (!) 23  SpO2 93 %  Patient Activity (if Appropriate) In bed  Assess: MEWS Score  MEWS Temp 0  MEWS Systolic 0  MEWS Pulse 2  MEWS RR 1  MEWS LOC 0  MEWS Score 3  MEWS Score Color Yellow  Assess: if the MEWS score is Yellow or Red  Were vital signs taken at a resting state? Yes  Focused Assessment No change from prior assessment  Early Detection of Sepsis Score *See Row Information* Low  MEWS guidelines implemented *See Row Information* Yes  Treat  MEWS Interventions Administered scheduled meds/treatments  Pain Scale 0-10  Pain Score 0  Interventions Relaxation  Neuro symptoms relieved by Rest  Patients response to intervention Relief  Take Vital Signs  Increase Vital Sign Frequency  Yellow: Q 2hr X 2 then Q 4hr X 2, if remains yellow, continue Q 4hrs  Escalate  MEWS: Escalate Yellow: discuss with charge nurse/RN and consider discussing with provider and RRT  Notify: Charge Nurse/RN  Name of Charge Nurse/RN Notified Kristen  Date Charge Nurse/RN Notified 11/08/20  Time Charge Nurse/RN Notified 0741  Document  Patient Outcome Stabilized after interventions  Progress note created (see row info) Yes

## 2020-11-10 DIAGNOSIS — R7881 Bacteremia: Secondary | ICD-10-CM | POA: Diagnosis not present

## 2020-11-10 DIAGNOSIS — B952 Enterococcus as the cause of diseases classified elsewhere: Secondary | ICD-10-CM | POA: Diagnosis not present

## 2020-11-10 LAB — CBC WITH DIFFERENTIAL/PLATELET
Abs Immature Granulocytes: 0.03 10*3/uL (ref 0.00–0.07)
Basophils Absolute: 0 10*3/uL (ref 0.0–0.1)
Basophils Relative: 0 %
Eosinophils Absolute: 0.2 10*3/uL (ref 0.0–0.5)
Eosinophils Relative: 2 %
HCT: 27.1 % — ABNORMAL LOW (ref 39.0–52.0)
Hemoglobin: 8.7 g/dL — ABNORMAL LOW (ref 13.0–17.0)
Immature Granulocytes: 0 %
Lymphocytes Relative: 14 %
Lymphs Abs: 1 10*3/uL (ref 0.7–4.0)
MCH: 27.1 pg (ref 26.0–34.0)
MCHC: 32.1 g/dL (ref 30.0–36.0)
MCV: 84.4 fL (ref 80.0–100.0)
Monocytes Absolute: 0.6 10*3/uL (ref 0.1–1.0)
Monocytes Relative: 9 %
Neutro Abs: 5.5 10*3/uL (ref 1.7–7.7)
Neutrophils Relative %: 75 %
Platelets: 335 10*3/uL (ref 150–400)
RBC: 3.21 MIL/uL — ABNORMAL LOW (ref 4.22–5.81)
RDW: 17.4 % — ABNORMAL HIGH (ref 11.5–15.5)
WBC: 7.4 10*3/uL (ref 4.0–10.5)
nRBC: 0 % (ref 0.0–0.2)

## 2020-11-10 LAB — RENAL FUNCTION PANEL
Albumin: 2.4 g/dL — ABNORMAL LOW (ref 3.5–5.0)
Anion gap: 10 (ref 5–15)
BUN: 41 mg/dL — ABNORMAL HIGH (ref 8–23)
CO2: 23 mmol/L (ref 22–32)
Calcium: 8.7 mg/dL — ABNORMAL LOW (ref 8.9–10.3)
Chloride: 104 mmol/L (ref 98–111)
Creatinine, Ser: 3.47 mg/dL — ABNORMAL HIGH (ref 0.61–1.24)
GFR, Estimated: 19 mL/min — ABNORMAL LOW (ref >60)
Glucose, Bld: 110 mg/dL — ABNORMAL HIGH (ref 70–99)
Phosphorus: 4 mg/dL (ref 2.5–4.6)
Potassium: 3.9 mmol/L (ref 3.5–5.1)
Sodium: 137 mmol/L (ref 135–145)

## 2020-11-10 LAB — GLUCOSE, CAPILLARY
Glucose-Capillary: 101 mg/dL — ABNORMAL HIGH (ref 70–99)
Glucose-Capillary: 128 mg/dL — ABNORMAL HIGH (ref 70–99)
Glucose-Capillary: 91 mg/dL (ref 70–99)
Glucose-Capillary: 87 mg/dL (ref 70–99)

## 2020-11-10 LAB — MAGNESIUM: Magnesium: 2.2 mg/dL (ref 1.7–2.4)

## 2020-11-10 MED ORDER — SODIUM CHLORIDE 0.9 % IV SOLN
2.0000 g | Freq: Three times a day (TID) | INTRAVENOUS | Status: DC
  Administered 2020-11-10 – 2020-11-16 (×18): 2 g via INTRAVENOUS
  Filled 2020-11-10 (×10): qty 2000
  Filled 2020-11-10: qty 2
  Filled 2020-11-10 (×8): qty 2000
  Filled 2020-11-10: qty 2

## 2020-11-10 NOTE — Progress Notes (Addendum)
[facility] Progress Note   Assessment/ Plan:   1. 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  1. Started on CRRT due to severe oliguric AKI on 11/27/21and stopped 11/05/20 2. Transitioned to IHD on 11/06/20 with some drop in BP but otherwise tolerated it well. 3. Renal US showed atrophic right kidney with mildly dilated collecting system, stable left perinephric hematoma, no left sided hydronephrosis. No need for replacement of nephrostomy tube at this time. 4. Plan was for TTS schedule but UOP remains good. His renal function appears to be stabilizing.  He remains asymptomatic, we will continue to hold RRT and eval daily. After an improving trend for 24-72 hrs then we will remove the catheter. 2. Severe Septic shock from Enterococcus faecalis bacteremia- presumably urinary source. Abx per ID and primary team.TEE negative for vegetations. 3. 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 IR following. 1. Appreciateurologyinput and will retry placing left PCN in a few days or longer once hydro is present. 2. Starting to make some urine, renal ultrasound showed no hydronephrosis.  4. Left perinephric hematoma- continue to follow H/H and transfuse prn. 5. Anemia of critical illness/ABLA- transfuse prn 6. Thrombocytopenia- likely due to critical illness. Anticoagulation on hold. Platelets improving. 7. Hyperkalemia- improved with CRRT initiation on 10/30/20.  8. Hypophosphatemia- follow and replace as needed. 9. Acute hypoxemic respiratory failure-extubated and doing well 10. AMS- EEGnonspecific.  improved  I have seen and examined this patient and agree with the plan of care.   Dwana Melena, MD 11/10/2020, 9:22 AM   Subjective:   Patient reports feeling well today, denies any new complaints. Denies fevers, chills, nausea, vomiting, headaches, chest pain, or dizziness.    Objective:   BP 124/77 (BP Location: Left Arm)   Pulse (!) 104   Temp 98.3 F (36.8 C) (Oral)   Resp 20   Ht 6\' 1"  (1.854 m)   Wt 116.9 kg   SpO2 98%   BMI 34.00 kg/m   Intake/Output Summary (Last 24 hours) at 11/10/2020 7408 Last data filed at 11/10/2020 0600 Gross per 24 hour  Intake 360 ml  Output 1350 ml  Net -990 ml   Weight change: 2.6 kg  Physical Exam: General: Middle aged male, NAD, laying in bed HEENT: Left IJ in place Cardiac: RRR, no m/r/g Pulmonary: CTABL, no wheezing, rhonchi, rales Extremity: No LE edema, sacral edema noted  Imaging: No results found.  Labs: BMET Recent Labs  Lab 11/05/20 1600 11/05/20 1600 11/06/20 0324 11/06/20 1839 11/07/20 0407 11/07/20 1606 11/08/20 1540 11/09/20 0026 11/10/20 0306  NA 136   < > 134* 137 137 138 138 139 137  K 3.7   < > 3.5 3.8 3.7 3.7 3.8 3.8 3.9  CL 100   < > 99 98 99 100 103 104 104  CO2 23   < > 24 26 27 25 25 24 23   GLUCOSE 179*   < > 145* 129* 114* 124* 129* 166* 110*  BUN 31*   < > 41* 23 32* 33* 40* 40* 41*  CREATININE 2.15*   < > 2.89* 1.95* 2.61* 3.09* 3.71* 3.69* 3.47*  CALCIUM 8.0*   < > 8.0* 8.1* 8.2* 8.2* 8.4* 8.3* 8.7*  PHOS 2.2*  --   --  2.2* 2.9 3.7 4.2 3.9 4.0   < > = values in this interval not displayed.   CBC Recent Labs  Lab 11/07/20 0407 11/08/20 1540 11/09/20 0026 11/10/20 0306  WBC 8.5 7.5 6.6 7.4  NEUTROABS 6.0 5.8 5.2 5.5  HGB 8.3* 8.8* 8.6* 8.7*  HCT 26.7* 27.3* 26.6* 27.1*  MCV 86.7 83.7 83.6 84.4  PLT 191 297 309 335    Medications:    . B-complex with vitamin C  1 tablet Oral Daily  . docusate  100 mg Oral BID  . famotidine  20 mg Oral QHS  . feeding supplement  237 mL  Oral TID BM  . insulin aspart  0-15 Units Subcutaneous TID WC  . insulin aspart  0-5 Units Subcutaneous QHS  . polyethylene glycol  17 g Oral Daily  . sodium chloride flush  10-40 mL Intracatheter Q12H     Asencion Noble, M.D. [facility] 11/10/2020 8:29 AM

## 2020-11-10 NOTE — Progress Notes (Signed)
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  He was found to have hematoma around left kidney.  In the emergency room, code sepsis was called due to concern for infection.  Treated with IV fluids and antibiotics.  CT scan abdomen pelvis revealed left-sided tube was withdrawn and there was prominent left renal hematoma.  Patient was tachycardic and temperature 104.  Patient was admitted to ICU, was given protamine 50 mg to reverse his heparin subcu apparently used at nursing home. Patient is long-term nursing home resident, has history of extensive DVT previously treated with Lovenox, unable to place IVC filter because of extensive DVT on IVC, unable to tolerate Lovenox.  Also developed subdural hematoma status post craniotomy on 01/26/2020. He has a stroke with right-sided weakness. Blood cultures grew Enterococcus faecalis.  Assessment & Plan:   Severe septic shock secondary to E faecalis bacteremia: -Likely source urine due to obstructive uropathy.  Off of vasopressors. -Repeat blood culture: Negative.  Urine culture: Negative.  TEE: Negative for vegetations. -ID recommended IV ampicillin until 12/25-appreciate help.  Patient remained afebrile, no leukocytosis. -Continue to monitor vitals closely.  Left perinephric hematoma/retroperitoneal fibrosis: -Appreciate IR and urology help. -Plan for replacement of nephrostomy tube however noted to have no adequate hydronephrosis for safe placement.  Currently does not have hydronephrosis. -Monitor H&H closely and transfuse as  needed.  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   -Patient started on CRRT 11/27-12/3 -Patient started on intermittent hemodialysis-first dialysis was on 12/4.   -Renal ultrasound showed atrophic right kidney with mildly dilated collecting system, stable left perinephric hematoma, no left-sided hydronephrosis.  No need for replacement of nephrostomy tube at this time.   -Nephrology is following-appreciate input-plan was for TTS schedule but urine output remains good.  He remains asymptomatic-continue to hold RRT for now.  Anemia of chronic disease/acute blood loss anemia: H&H is currently stable.  Transfuse as needed.  Acute hypoxemic respiratory failure: Secondary to fluid overload.  Resolved.  Patient is maintaining oxygen saturation on room air.  Acute metabolic encephalopathy: Likely in the setting of sepsis.  Improving.  EEG: Nonspecific.  Hyperkalemia: Improved with CRRT.  Thrombocytopenia: Resolved  Prediabetes: Blood sugar improving.  A1c: 6.4%.  Continue on sliding scale insulin  DVT prophylaxis: SCD  Code Status: Full code Family Communication:  None present at bedside.  Plan of care discussed with patient in length and he verbalized understanding and agreed with it. Disposition Plan: SNF  Consultants:   PCCM  Nephrology  Urology  IR  Infectious disease  Procedures:   Central line  EEG  Antimicrobials:  Ampicillin Status is: Inpatient  Dispo: The patient is from: SNF              Anticipated d/c is to: SNF              Anticipated d/c date is: > 3 days              Patient currently is not medically stable  to d/c.   Subjective: Patient seen and examined.  Tells me that he feels better today, denies fever, chills, nausea, vomiting, chest pain, shortness of breath, abdominal pain, orthopnea  or PND.  Objective: Vitals:   11/09/20 1536 11/09/20 2046 11/10/20 0521 11/10/20 1041  BP: 130/79 121/81 124/77 121/80  Pulse: 97 100 (!) 104 (!) 101  Resp: 20 20 20 18   Temp: 99.5 F (37.5 C) 100.1 F (37.8 C) 98.3 F (36.8 C) 98.7 F (37.1 C)  TempSrc: Oral Oral Oral Oral  SpO2: 100% 100% 98% 98%  Weight: 119.7 kg 116.9 kg    Height: 6\' 1"  (1.854 m)       Intake/Output Summary (Last 24 hours) at 11/10/2020 1322 Last data filed at 11/10/2020 0801 Gross per 24 hour  Intake 60 ml  Output 1350 ml  Net -1290 ml   Filed Weights   11/09/20 0500 11/09/20 1536 11/09/20 2046  Weight: 117.1 kg 119.7 kg 116.9 kg    Examination:  General exam: Appears calm and comfortable, obese, on room air, elderly looking. Respiratory system: Clear to auscultation. Respiratory effort normal. Cardiovascular system: S1 & S2 heard, RRR. No JVD, murmurs, rubs, gallops or clicks.  Bilateral 2+ pitting edema positive.  Temporary catheter in right IJ. gastrointestinal system: Abdomen is nondistended, soft and nontender. No organomegaly or masses felt. Normal bowel sounds heard. Central nervous system: Alert and oriented. Skin: No rashes, lesions or ulcers Psychiatry: Judgement and insight appear normal. Mood & affect appropriate.    Data Reviewed: I have personally reviewed following labs and imaging studies  CBC: Recent Labs  Lab 11/06/20 0324 11/06/20 0324 11/06/20 1839 11/07/20 0407 11/08/20 1540 11/09/20 0026 11/10/20 0306  WBC 9.8   < > 9.8 8.5 7.5 6.6 7.4  NEUTROABS 6.8  --   --  6.0 5.8 5.2 5.5  HGB 8.3*   < > 8.6* 8.3* 8.8* 8.6* 8.7*  HCT 26.1*   < > 26.8* 26.7* 27.3* 26.6* 27.1*  MCV 85.3   < > 85.1 86.7 83.7 83.6 84.4  PLT 148*   < > 167 191 297 309 335   < > = values in this interval not displayed.   Basic Metabolic Panel: Recent Labs  Lab 11/06/20 0324 11/06/20 1839 11/07/20 0407 11/07/20 1606 11/08/20 1540 11/09/20 0026 11/10/20 0306  NA 134*   < > 137 138 138 139  137  K 3.5   < > 3.7 3.7 3.8 3.8 3.9  CL 99   < > 99 100 103 104 104  CO2 24   < > 27 25 25 24 23   GLUCOSE 145*   < > 114* 124* 129* 166* 110*  BUN 41*   < > 32* 33* 40* 40* 41*  CREATININE 2.89*   < > 2.61* 3.09* 3.71* 3.69* 3.47*  CALCIUM 8.0*   < > 8.2* 8.2* 8.4* 8.3* 8.7*  MG 2.4  --  2.3  --  2.4 2.2 2.2  PHOS  --    < > 2.9 3.7 4.2 3.9 4.0   < > = values in this interval not displayed.   GFR: Estimated Creatinine Clearance: 29.9 mL/min (A) (by C-G formula based on SCr of 3.47 mg/dL (H)). Liver Function Tests: Recent Labs  Lab 11/04/20 0304 11/04/20 1634 11/05/20 0314 11/05/20 1600 11/06/20 0324 11/06/20 1839 11/07/20 0407 11/07/20 1606 11/08/20 1540 11/09/20 0026 11/10/20 0306  AST 57*  --  76*  --  58*  --   --   --   --   --   --  ALT 47*  --  64*  --  54*  --   --   --   --   --   --   ALKPHOS 67  --  85  --  86  --   --   --   --   --   --   BILITOT 0.7  --  1.4*  --  1.0  --   --   --   --   --   --   PROT 6.6  --  6.6  --  6.0*  --   --   --   --   --   --   ALBUMIN 2.7*  2.7*   < > 2.6*   < > 2.4*   < > 2.6* 2.7* 2.4* 2.3* 2.4*   < > = values in this interval not displayed.   No results for input(s): LIPASE, AMYLASE in the last 168 hours. No results for input(s): AMMONIA in the last 168 hours. Coagulation Profile: No results for input(s): INR, PROTIME in the last 168 hours. Cardiac Enzymes: No results for input(s): CKTOTAL, CKMB, CKMBINDEX, TROPONINI in the last 168 hours. BNP (last 3 results) No results for input(s): PROBNP in the last 8760 hours. HbA1C: No results for input(s): HGBA1C in the last 72 hours. CBG: Recent Labs  Lab 11/09/20 0803 11/09/20 1139 11/09/20 1644 11/10/20 0657 11/10/20 1132  GLUCAP 108* 139* 113* 101* 128*   Lipid Profile: No results for input(s): CHOL, HDL, LDLCALC, TRIG, CHOLHDL, LDLDIRECT in the last 72 hours. Thyroid Function Tests: No results for input(s): TSH, T4TOTAL, FREET4, T3FREE, THYROIDAB in the last 72  hours. Anemia Panel: No results for input(s): VITAMINB12, FOLATE, FERRITIN, TIBC, IRON, RETICCTPCT in the last 72 hours. Sepsis Labs: No results for input(s): PROCALCITON, LATICACIDVEN in the last 168 hours.  No results found for this or any previous visit (from the past 240 hour(s)).    Radiology Studies: No results found.  Scheduled Meds: . B-complex with vitamin C  1 tablet Oral Daily  . docusate  100 mg Oral BID  . famotidine  20 mg Oral QHS  . feeding supplement  237 mL Oral TID BM  . insulin aspart  0-15 Units Subcutaneous TID WC  . insulin aspart  0-5 Units Subcutaneous QHS  . polyethylene glycol  17 g Oral Daily  . sodium chloride flush  10-40 mL Intracatheter Q12H   Continuous Infusions: . sodium chloride Stopped (11/07/20 0923)  . ampicillin (OMNIPEN) IV       LOS: 11 days   Time spent: 40  minutes   Brendan [name] Brendan Quill, MD [facility]  If 7PM-7AM, please contact night-coverage [email or web address] 11/10/2020, 1:22 PM

## 2020-11-10 NOTE — Progress Notes (Signed)
PHARMACY CONSULT NOTE FOR:  OUTPATIENT  PARENTERAL ANTIBIOTIC THERAPY (OPAT)  Indication: Enterococcal bacteremia Regimen: Ampicillin 2 gm IV q 8 hours (dose updated) End date: 11/27/2020  IV antibiotic discharge orders are pended. To discharging provider:  please sign these orders via discharge navigator,  Select New Orders & click on the button choice - Manage This Unsigned Work.     Thank you for allowing pharmacy to be a part of this patient's care.  Alycia Rossetti, PharmD, BCPS Clinical Pharmacist Clinical phone for 11/10/2020: [phone] 11/10/2020 11:45 AM   **Pharmacist phone directory can now be found on [email or web address] ([email or web address]).  Listed under [place].

## 2020-11-11 ENCOUNTER — Other Ambulatory Visit (HOSPITAL_COMMUNITY): Payer: Medicaid Other

## 2020-11-11 LAB — CBC WITH DIFFERENTIAL/PLATELET
Abs Immature Granulocytes: 0.03 10*3/uL (ref 0.00–0.07)
Basophils Absolute: 0 10*3/uL (ref 0.0–0.1)
Basophils Relative: 0 %
Eosinophils Absolute: 0.2 10*3/uL (ref 0.0–0.5)
Eosinophils Relative: 2 %
HCT: 28.7 % — ABNORMAL LOW (ref 39.0–52.0)
Hemoglobin: 8.9 g/dL — ABNORMAL LOW (ref 13.0–17.0)
Immature Granulocytes: 0 %
Lymphocytes Relative: 18 %
Lymphs Abs: 1.2 10*3/uL (ref 0.7–4.0)
MCH: 26.4 pg (ref 26.0–34.0)
MCHC: 31 g/dL (ref 30.0–36.0)
MCV: 85.2 fL (ref 80.0–100.0)
Monocytes Absolute: 0.5 10*3/uL (ref 0.1–1.0)
Monocytes Relative: 7 %
Neutro Abs: 4.8 10*3/uL (ref 1.7–7.7)
Neutrophils Relative %: 73 %
Platelets: 344 10*3/uL (ref 150–400)
RBC: 3.37 MIL/uL — ABNORMAL LOW (ref 4.22–5.81)
RDW: 17.1 % — ABNORMAL HIGH (ref 11.5–15.5)
WBC: 6.7 10*3/uL (ref 4.0–10.5)
nRBC: 0 % (ref 0.0–0.2)

## 2020-11-11 LAB — COMPREHENSIVE METABOLIC PANEL
ALT: 37 U/L (ref 0–44)
AST: 43 U/L — ABNORMAL HIGH (ref 15–41)
Albumin: 2.4 g/dL — ABNORMAL LOW (ref 3.5–5.0)
Alkaline Phosphatase: 64 U/L (ref 38–126)
Anion gap: 10 (ref 5–15)
BUN: 36 mg/dL — ABNORMAL HIGH (ref 8–23)
CO2: 22 mmol/L (ref 22–32)
Calcium: 8.6 mg/dL — ABNORMAL LOW (ref 8.9–10.3)
Chloride: 105 mmol/L (ref 98–111)
Creatinine, Ser: 3.52 mg/dL — ABNORMAL HIGH (ref 0.61–1.24)
GFR, Estimated: 19 mL/min — ABNORMAL LOW (ref >60)
Glucose, Bld: 95 mg/dL (ref 70–99)
Potassium: 4.3 mmol/L (ref 3.5–5.1)
Sodium: 137 mmol/L (ref 135–145)
Total Bilirubin: 1.3 mg/dL — ABNORMAL HIGH (ref 0.3–1.2)
Total Protein: 7.5 g/dL (ref 6.5–8.1)

## 2020-11-11 LAB — GLUCOSE, CAPILLARY
Glucose-Capillary: 94 mg/dL (ref 70–99)
Glucose-Capillary: 117 mg/dL — ABNORMAL HIGH (ref 70–99)
Glucose-Capillary: 132 mg/dL — ABNORMAL HIGH (ref 70–99)
Glucose-Capillary: 105 mg/dL — ABNORMAL HIGH (ref 70–99)

## 2020-11-11 LAB — PHOSPHORUS: Phosphorus: 4.3 mg/dL (ref 2.5–4.6)

## 2020-11-11 LAB — MAGNESIUM: Magnesium: 2 mg/dL (ref 1.7–2.4)

## 2020-11-11 MED ORDER — PROSOURCE PLUS PO LIQD
30.0000 mL | Freq: Two times a day (BID) | ORAL | Status: DC
  Administered 2020-11-11 – 2020-11-15 (×7): 30 mL via ORAL
  Filled 2020-11-11 (×9): qty 30

## 2020-11-11 MED ORDER — RENA-VITE PO TABS
1.0000 | ORAL_TABLET | Freq: Every day | ORAL | Status: DC
  Administered 2020-11-11 – 2020-11-15 (×5): 1 via ORAL
  Filled 2020-11-11 (×5): qty 1

## 2020-11-11 NOTE — Plan of Care (Signed)
  Problem: Education: Goal: Knowledge of General Education information will improve Description Including pain rating scale, medication(s)/side effects and non-pharmacologic comfort measures Outcome: Progressing   

## 2020-11-11 NOTE — Progress Notes (Signed)
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  He was found to have hematoma around left kidney.  In the emergency room, code sepsis was called due to concern for infection.  Treated with IV fluids and antibiotics.  CT scan abdomen pelvis revealed left-sided tube was withdrawn and there was prominent left renal hematoma.  Patient was tachycardic and temperature 104.  Patient was admitted to ICU, was given protamine 50 mg to reverse his heparin subcu apparently used at nursing home. Patient is long-term nursing home resident, has history of extensive DVT previously treated with Lovenox, unable to place IVC filter because of extensive DVT on IVC, unable to tolerate Lovenox.  Also developed subdural hematoma status post craniotomy on 01/26/2020. He has a stroke with right-sided weakness. Blood cultures grew Enterococcus faecalis.  Assessment & Plan:   Severe septic shock secondary to E faecalis bacteremia: -Likely source urine due to obstructive uropathy.  Resolved.  Off of vasopressors. -Repeat blood culture: Negative.  Urine culture: Negative.  TEE: Negative for vegetations. -ID recommended IV ampicillin until 12/25-appreciate help.  Patient remained afebrile, no leukocytosis. -Continue to monitor vitals closely.  Obstructive uropathy in the setting of idiopathic retroperitoneal fibrosis: -with chronic indwelling bilateral PNT's with nonfunctioning left nephrostomy tube and unsuccessful exchange attempt per IR complicated by subcapsular hematoma and progressive hemoperitoneum by CT  scan.  -Plan for replacement of nephrostomy tube however noted to have no adequate hydronephrosis for safe placement.  Currently does not have hydronephrosis.  IR and urology signed off -Monitor H&H closely and transfuse as needed.  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   -Patient started on CRRT 11/27-12/3 -Patient started on intermittent hemodialysis-first dialysis was on 12/4.   -Renal ultrasound showed atrophic right kidney with mildly dilated collecting system, stable left perinephric hematoma, no left-sided hydronephrosis.  No need for replacement of nephrostomy tube at this time.   -Nephrology is following-appreciate input-plan was for TTS schedule but urine output remains good.  He remains asymptomatic-continue to hold RRT for now.  Continue to monitor kidney function closely before removing L IJ temporary catheter.  Left perinephric hematoma: Monitor H&H closely and transfuse as needed.  Anemia of chronic disease/acute blood loss anemia: H&H is currently stable.  Transfuse as needed.  Acute hypoxemic respiratory failure: Secondary to fluid overload.  Resolved.  Patient is maintaining oxygen saturation on room air.  Acute metabolic encephalopathy: Likely in the setting of sepsis.  Improving.  EEG: Nonspecific.  Hyperkalemia: Improved with CRRT.  Thrombocytopenia: Resolved  Hypophosphatemia: Replenished.  Resolved.  Prediabetes: Blood sugar improving.  A1c: 6.4%.  Continue on sliding scale insulin  DVT prophylaxis: SCD  Code Status: Full code Family Communication:  None present at bedside.  Plan of care discussed with patient in length and he verbalized understanding and agreed with it. Disposition Plan: SNF  Consultants:   PCCM  Nephrology  Urology  IR  Infectious disease  Procedures:   Central  line  EEG  Antimicrobials:  Ampicillin Status is:  Inpatient  Dispo: The patient is from: SNF              Anticipated d/c is to: SNF              Anticipated d/c date is: > 3 days              Patient currently is not medically stable to d/c.   Subjective: Patient seen and examined.  He is alert and following commands however not oriented to time and person.  Tells me that he feels well today.  No new complaints.  Denies fever, chills, abdominal pain, nausea, vomiting, chest pain or shortness of breath  Objective: Vitals:   11/10/20 1650 11/10/20 2102 11/11/20 0515 11/11/20 0918  BP: 122/84 132/78 139/80 124/75  Pulse: 99 97 (!) 106 98  Resp: 18 19 17 20   Temp: 98.9 F (37.2 C) 98.9 F (37.2 C) 98.4 F (36.9 C) 98.3 F (36.8 C)  TempSrc: Oral Oral  Oral  SpO2: 97% 98% 97% 97%  Weight:  116.9 kg 114.4 kg   Height:        Intake/Output Summary (Last 24 hours) at 11/11/2020 1344 Last data filed at 11/11/2020 1100 Gross per 24 hour  Intake 880 ml  Output 1750 ml  Net -870 ml   Filed Weights   11/09/20 2046 11/10/20 2102 11/11/20 0515  Weight: 116.9 kg 116.9 kg 114.4 kg    Examination: General exam: Appears calm and comfortable, elderly looking, on room air, obese Respiratory system: Clear to auscultation. Respiratory effort normal.  Left IJ is placed Cardiovascular system: S1 & S2 heard, RRR. No JVD, murmurs, rubs, gallops or clicks.  Bilateral 2+ pitting edema positive Gastrointestinal system: Abdomen is nondistended, soft and nontender. No organomegaly or masses felt. Normal bowel sounds heard. Central nervous system: Alert and oriented x1.  Following commands.   Right-sided hemiplegic.  Skin: No rashes, lesions or ulcers.   Data Reviewed: I have personally reviewed following labs and imaging studies  CBC: Recent Labs  Lab 11/07/20 0407 11/08/20 1540 11/09/20 0026 11/10/20 0306 11/11/20 0052  WBC 8.5 7.5 6.6 7.4 6.7  NEUTROABS 6.0 5.8 5.2 5.5 4.8  HGB  8.3* 8.8* 8.6* 8.7* 8.9*  HCT 26.7* 27.3* 26.6* 27.1* 28.7*  MCV 86.7 83.7 83.6 84.4 85.2  PLT 191 297 309 335 277   Basic Metabolic Panel: Recent Labs  Lab 11/07/20 0407 11/07/20 1606 11/08/20 1540 11/09/20 0026 11/10/20 0306 11/11/20 0052  NA 137 138 138 139 137 137  K 3.7 3.7 3.8 3.8 3.9 4.3  CL 99 100 103 104 104 105  CO2 27 25 25 24 23 22   GLUCOSE 114* 124* 129* 166* 110* 95  BUN 32* 33* 40* 40* 41* 36*  CREATININE 2.61* 3.09* 3.71* 3.69* 3.47* 3.52*  CALCIUM 8.2* 8.2* 8.4* 8.3* 8.7* 8.6*  MG 2.3  --  2.4 2.2 2.2 2.0  PHOS 2.9 3.7 4.2 3.9 4.0 4.3   GFR: Estimated Creatinine Clearance: 29.2 mL/min (A) (by C-G formula based on SCr of 3.52 mg/dL (H)). Liver Function Tests: Recent Labs  Lab 11/05/20 0314 11/05/20 1600 11/06/20 0324 11/06/20 1839 11/07/20 1606 11/08/20 1540 11/09/20 0026 11/10/20 0306 11/11/20 0052  AST 76*  --  58*  --   --   --   --   --  43*  ALT 64*  --  54*  --   --   --   --   --  37  ALKPHOS 85  --  86  --   --   --   --   --  64  BILITOT 1.4*  --  1.0  --   --   --   --   --  1.3*  PROT 6.6  --  6.0*  --   --   --   --   --  7.5  ALBUMIN 2.6*   < > 2.4*   < > 2.7* 2.4* 2.3* 2.4* 2.4*   < > = values in this interval not displayed.   No results for input(s): LIPASE, AMYLASE in the last 168 hours. No results for input(s): AMMONIA in the last 168 hours. Coagulation Profile: No results for input(s): INR, PROTIME in the last 168 hours. Cardiac Enzymes: No results for input(s): CKTOTAL, CKMB, CKMBINDEX, TROPONINI in the last 168 hours. BNP (last 3 results) No results for input(s): PROBNP in the last 8760 hours. HbA1C: No results for input(s): HGBA1C in the last 72 hours. CBG: Recent Labs  Lab 11/10/20 1132 11/10/20 1651 11/10/20 2103 11/11/20 0638 11/11/20 1121  GLUCAP 128* 91 87 94 117*   Lipid Profile: No results for input(s): CHOL, HDL, LDLCALC, TRIG, CHOLHDL, LDLDIRECT in the last 72 hours. Thyroid Function Tests: No results  for input(s): TSH, T4TOTAL, FREET4, T3FREE, THYROIDAB in the last 72 hours. Anemia Panel: No results for input(s): VITAMINB12, FOLATE, FERRITIN, TIBC, IRON, RETICCTPCT in the last 72 hours. Sepsis Labs: No results for input(s): PROCALCITON, LATICACIDVEN in the last 168 hours.  No results found for this or any previous visit (from the past 240 hour(s)).    Radiology Studies: No results found.  Scheduled Meds: . B-complex with vitamin C  1 tablet Oral Daily  . docusate  100 mg Oral BID  . famotidine  20 mg Oral QHS  . feeding supplement  237 mL Oral TID BM  . insulin aspart  0-15 Units Subcutaneous TID WC  . insulin aspart  0-5 Units Subcutaneous QHS  . polyethylene glycol  17 g Oral Daily  . sodium chloride flush  10-40 mL Intracatheter Q12H   Continuous Infusions: . sodium chloride Stopped (11/07/20 0923)  . ampicillin (OMNIPEN) IV 2 g (11/11/20 0614)     LOS: 12 days   Time spent: 40  minutes   Shuan Statzer Loann Quill, MD [facility]  If 7PM-7AM, please contact night-coverage [email or web address] 11/11/2020, 1:44 PM

## 2020-11-11 NOTE — Progress Notes (Signed)
Nutrition Follow-up  DOCUMENTATION CODES:   Obesity unspecified  INTERVENTION:   Continue Ensure Enlive po TID, each supplement provides 350 kcal and 20 grams of protein  PROsource Plus 30 mL BID, each supplement provides 100 kcals, 15 grams of protein.   Consider downgrading diet to Dysphagia 3 for ease of chewing.   Discontinue B complex with Vitamin C, switch to renal MVI.   NUTRITION DIAGNOSIS:   Inadequate oral intake related to decreased appetite as evidenced by per patient/family report.  Ongoing  GOAL:   Patient will meet greater than or equal to 90% of their needs  Ongoing  MONITOR:   PO intake,Supplement acceptance,Weight trends,Labs  REASON FOR ASSESSMENT:   Ventilator,Consult Enteral/tube feeding initiation and management  ASSESSMENT:   59 year old male who presented to the ED on 11/26 with emesis, fever, and blood in nephrostomy tube. 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 Pt required intubation in the ED to protect airway and was found to have prominent left renal hematoma with dislodged nephrostomy tube. Pt admitted with sepsis.  11/27 - s/p unsuccessful attempt at replacement of nephrostomy tube, CRRT initiated 12/1- patient self-extubated 12/1- Repeat CT abdomen- left renal capsular hematoma, no hydronephrosis, will require L PCN tube placement in the future. Remains on CRRT. 12/2- diet advanced to soft 12/3- CRRT stopped 12/4- Intermittent HD started  HD schedule was for TTS but urine output remains good. He remains asymptomatic- hold RRT for now.   Pt stated he is has not been eating very much since diet advancement on 12/2. Pt stated the food is not good and he cannot find anything he likes. Pt also reports he has trouble swallowing and chewing and prefers to eat items that  he can chew easily. When asked about downgrading diet to Dysphagia 3, pt stated he will not like that either. Stated the food will still be "nasty". Pt reports drinking 2 Ensures, along with water and ginger ale during the day. Per chart review, pt has consumed 0-20% of last 8 documented meal records. Noted that 6 of the meal records were taken on the same day.   Pt reports a weight loss of ~10 lbs since this admission. Per chart review, pt weight has fluctuated during this admission, likely d/t fluid accumulation. Noted nursing last documented moderate pitting edema.   Pt is bed-bound at baseline.   UOP 1.7 L x 24 hours, 300 mL reported today. I/O's: +651 mL since admission  Admission weight: 109 kg, Current weight: 114.4 kg   Labs reviewed: CBG's x 24 hours: 87-139, corrected calcium: 9.8  Medications reviewed and include: B-complex with vitamin C, colace, SSI, miralax   Diet Order:   Diet Order            DIET SOFT Room service appropriate? Yes; Fluid consistency: Thin  Diet effective now                 EDUCATION NEEDS:   No education needs have been identified at this time  Skin:  Skin Assessment: Skin Integrity Issues: Skin Integrity Issues:: Other (Comment) Other: Skin tear R buttocks  Last BM:  11/10/20  Height:   Ht Readings from Last 1 Encounters:  11/09/20 6\' 1"  (1.854 m)    Weight:   Wt Readings from Last 1 Encounters:  11/11/20 114.4 kg    Ideal Body Weight:  83.6  kg  BMI:  Body mass index is 33.27 kg/m.  Estimated Nutritional Needs:   Kcal:  2100-2300  Protein:  110-130 grams  Fluid:  2.0 L/day    Ronnald Nian, Dietetic Intern Pager: [phone] If unavailable: [phone]

## 2020-11-11 NOTE — Progress Notes (Addendum)
[facility] Progress Note   Assessment/ Plan:   1. 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  1. Started on CRRT due to severe oliguric AKI on 11/27/21and stopped 11/05/20 2. Transitioned to IHD on 11/06/20 with some drop in BP but otherwise tolerated it well. 3. Renal US showed atrophic right kidney with mildly dilated collecting system, stable left perinephric hematoma, no left sided hydronephrosis. No need for replacement of nephrostomy tube at this time. 4. Plan was for TTS schedule but UOP remains good. His renal function appears to be stabilizing however had a small increase of Cr to 3.52 from 3.47 today but fortunately he remains asymptomatic with GREAT UOP. We will continue to hold RRT and eval daily. Would like to see a trend towards  improvement before  removing the LIJ temp catheter. 2. Severe Septic shock from Enterococcus faecalis bacteremia- presumably urinary source. Abx per ID and primary team.TEE negative for vegetations. 3. 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 IR following. 1. Appreciateurologyinput and will retry placing left PCN in a few days or longer once hydro is present. 2. Starting to make some urine, renal ultrasound showed no hydronephrosis.  4. Left perinephric hematoma- continue to follow H/H and transfuse prn. 5. Anemia of critical illness/ABLA- transfuse prn 6. Thrombocytopenia- likely due to critical illness. Anticoagulation on hold. Platelets improving. 7. Hyperkalemia- improved with CRRT initiation on 10/30/20.  8. Hypophosphatemia- follow and replace as  needed. 9. Acute hypoxemic respiratory failure-extubated and doing well 10. AMS- EEGnonspecific. improved  I have seen and examined this patient and agree with the plan of care.  Dwana Melena, MD 11/11/2020, 9:15 AM   Subjective:   Reports feeling well today, no new complaints.  Denies fevers, chills, nausea, vomiting, headaches, chest pain, or dizziness.   Objective:   BP 139/80 (BP Location: Left Arm)   Pulse (!) 106   Temp 98.4 F (36.9 C)   Resp 17   Ht 6\' 1"  (1.854 m)   Wt 114.4 kg   SpO2 97%   BMI 33.27 kg/m   Intake/Output Summary (Last 24 hours) at 11/11/2020 2353 Last data filed at 11/11/2020 6144 Gross per 24 hour  Intake 640 ml  Output 1450 ml  Net -810 ml   Weight change: -2.797 kg  Physical Exam: General: Middle aged male, NAD, laying in bed HEENT: Left IJ in place  Cardiac: RRR, no m/r/g Pulmonary: CTABL, no wheezing, rhonchi, rales Extremity: No LE edema, trace sacral edema noted  Imaging: No results found.  Labs: BMET Recent Labs  Lab 11/06/20 1839 11/07/20 0407 11/07/20 1606 11/08/20 1540 11/09/20 0026 11/10/20 0306 11/11/20 0052  NA 137 137 138 138 139 137 137  K 3.8 3.7 3.7 3.8 3.8 3.9 4.3  CL 98 99 100 103 104 104 105  CO2 26 27 25 25 24 23 22   GLUCOSE 129* 114* 124* 129* 166* 110* 95  BUN 23 32* 33* 40* 40* 41* 36*  CREATININE 1.95* 2.61* 3.09* 3.71* 3.69* 3.47* 3.52*  CALCIUM 8.1* 8.2* 8.2* 8.4* 8.3* 8.7* 8.6*  PHOS 2.2* 2.9 3.7 4.2 3.9 4.0 4.3   CBC Recent Labs  Lab 11/08/20 1540 11/09/20 0026 11/10/20 0306 11/11/20 0052  WBC 7.5 6.6  7.4 6.7  NEUTROABS 5.8 5.2 5.5 4.8  HGB 8.8* 8.6* 8.7* 8.9*  HCT 27.3* 26.6* 27.1* 28.7*  MCV 83.7 83.6 84.4 85.2  PLT 297 309 335 344    Medications:    . B-complex with vitamin C  1 tablet Oral Daily  . docusate  100 mg Oral BID  . famotidine  20 mg Oral QHS  . feeding supplement  237 mL Oral TID BM  . insulin aspart  0-15 Units Subcutaneous TID WC  . insulin aspart  0-5  Units Subcutaneous QHS  . polyethylene glycol  17 g Oral Daily  . sodium chloride flush  10-40 mL Intracatheter Q12H     Asencion Noble, M.D. PGY3 11/11/2020 9:05 AM

## 2020-11-11 NOTE — Plan of Care (Signed)
  Problem: Elimination: Goal: Will not experience complications related to urinary retention Outcome: Progressing   Problem: Role Relationship: Goal: Method of communication will improve Outcome: Progressing

## 2020-11-12 LAB — CBC WITH DIFFERENTIAL/PLATELET
Abs Immature Granulocytes: 0.03 10*3/uL (ref 0.00–0.07)
Basophils Absolute: 0 10*3/uL (ref 0.0–0.1)
Basophils Relative: 0 %
Eosinophils Absolute: 0.2 10*3/uL (ref 0.0–0.5)
Eosinophils Relative: 2 %
HCT: 26 % — ABNORMAL LOW (ref 39.0–52.0)
Hemoglobin: 8 g/dL — ABNORMAL LOW (ref 13.0–17.0)
Immature Granulocytes: 1 %
Lymphocytes Relative: 20 %
Lymphs Abs: 1.3 10*3/uL (ref 0.7–4.0)
MCH: 26.1 pg (ref 26.0–34.0)
MCHC: 30.8 g/dL (ref 30.0–36.0)
MCV: 84.7 fL (ref 80.0–100.0)
Monocytes Absolute: 0.5 10*3/uL (ref 0.1–1.0)
Monocytes Relative: 8 %
Neutro Abs: 4.5 10*3/uL (ref 1.7–7.7)
Neutrophils Relative %: 69 %
Platelets: 337 10*3/uL (ref 150–400)
RBC: 3.07 MIL/uL — ABNORMAL LOW (ref 4.22–5.81)
RDW: 16.9 % — ABNORMAL HIGH (ref 11.5–15.5)
WBC: 6.4 10*3/uL (ref 4.0–10.5)
nRBC: 0 % (ref 0.0–0.2)

## 2020-11-12 LAB — MAGNESIUM: Magnesium: 1.9 mg/dL (ref 1.7–2.4)

## 2020-11-12 LAB — RENAL FUNCTION PANEL
Albumin: 2.2 g/dL — ABNORMAL LOW (ref 3.5–5.0)
Anion gap: 8 (ref 5–15)
BUN: 37 mg/dL — ABNORMAL HIGH (ref 8–23)
CO2: 22 mmol/L (ref 22–32)
Calcium: 8.2 mg/dL — ABNORMAL LOW (ref 8.9–10.3)
Chloride: 106 mmol/L (ref 98–111)
Creatinine, Ser: 3.44 mg/dL — ABNORMAL HIGH (ref 0.61–1.24)
GFR, Estimated: 19 mL/min — ABNORMAL LOW (ref >60)
Glucose, Bld: 94 mg/dL (ref 70–99)
Phosphorus: 3.8 mg/dL (ref 2.5–4.6)
Potassium: 4.2 mmol/L (ref 3.5–5.1)
Sodium: 136 mmol/L (ref 135–145)

## 2020-11-12 LAB — GLUCOSE, CAPILLARY
Glucose-Capillary: 110 mg/dL — ABNORMAL HIGH (ref 70–99)
Glucose-Capillary: 125 mg/dL — ABNORMAL HIGH (ref 70–99)
Glucose-Capillary: 101 mg/dL — ABNORMAL HIGH (ref 70–99)
Glucose-Capillary: 101 mg/dL — ABNORMAL HIGH (ref 70–99)

## 2020-11-12 NOTE — Progress Notes (Signed)
PROGRESS NOTE    Brendan [name]  SJG:[id number] DOB: May 08, 1961 DOA: 10/29/2020 PCP: Cleda Mccreedy, MD   Brief Narrative:  59 year old gentleman with history of stage IV chronic kidney disease due to obstructive uropathy from idiopathic retroperitoneal fibrosis on CellCept, history of bilateral percutaneous nephrostomy tube and currently has a left PCN presented to outside hospital emergency room with nausea, vomiting fever and blood on his left nephrostomy tube.  He was found to have hematoma around left kidney.  In the emergency room, code sepsis was called due to concern for infection.  Treated with IV fluids and antibiotics.  CT scan abdomen pelvis revealed left-sided tube was withdrawn and there was prominent left renal hematoma.  Patient was tachycardic and temperature 104.  Patient was admitted to ICU, was given protamine 50 mg to reverse his heparin subcu apparently used at nursing home. Patient is long-term nursing home resident, has history of extensive DVT previously treated with Lovenox, unable to place IVC filter because of extensive DVT on IVC, unable to tolerate Lovenox.  Also developed subdural hematoma status post craniotomy on 01/26/2020. He has a stroke with right-sided weakness. Blood cultures grew Enterococcus faecalis.  Assessment & Plan:   Severe septic shock secondary to E faecalis bacteremia: -Likely source urine due to obstructive uropathy.  Resolved.  Off of vasopressors. -Repeat blood culture: Negative.  Urine culture: Negative.  TEE: Negative for vegetations. -ID recommended IV ampicillin until 12/25-appreciate help.  Patient remained afebrile, no leukocytosis. -Continue to monitor vitals closely.  Obstructive uropathy in the setting of idiopathic retroperitoneal fibrosis: -with chronic indwelling bilateral PNT's with nonfunctioning left nephrostomy tube and unsuccessful exchange attempt per IR complicated by subcapsular hematoma and progressive hemoperitoneum by CT  scan.  -Plan for replacement of nephrostomy tube however noted to have no adequate hydronephrosis for safe placement.  Currently does not have hydronephrosis.  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   -Patient started on CRRT 11/27-12/3 -Patient started on intermittent hemodialysis-first dialysis was on 12/4.   -Renal ultrasound showed atrophic right kidney with mildly dilated collecting system, stable left perinephric hematoma, no left-sided hydronephrosis.  No need for replacement of nephrostomy tube at this time.   -Nephrology is following-appreciate input-plan was for TTS schedule but urine output remains good.  He remains asymptomatic-continue to hold RRT for now.  Continue to monitor kidney function closely before removing L IJ temporary catheter. -Renal function: Creatinine: 3.44, GFR: 19.  Left perinephric hematoma: Monitor H&H closely and transfuse as needed.  Anemia of chronic disease/acute blood loss anemia: H&H is currently stable.  Transfuse as needed.  Acute hypoxemic respiratory failure: Secondary to fluid overload.  Resolved.  Patient is maintaining oxygen saturation on room air.  Acute metabolic encephalopathy: Likely in the setting of sepsis.  Improving.  EEG: Nonspecific.  Hyperkalemia: Improved with CRRT.  Thrombocytopenia: Resolved  Hypophosphatemia: Replenished.  Resolved.  Prediabetes: Blood sugar improving.  A1c: 6.4%.  Continue on sliding scale insulin  History of stroke/ history of prior subdural hematoma status post craniotomy: -With residual right-sided weakness -At baseline -Consult PT/OT  History of right lower extremity DVT: Hold Lovenox in the setting of renal hematoma.  DVT prophylaxis: SCD  Code Status: Full code Family Communication:  None present at  bedside.  Plan of care discussed with patient in length and he verbalized understanding and  agreed with it. Disposition Plan: SNF  Consultants:   PCCM  Nephrology  Urology  IR  Infectious disease  Procedures:   Central line  EEG  Antimicrobials:  Ampicillin Status is: Inpatient  Dispo: The patient is from: SNF              Anticipated d/c is to: SNF              Anticipated d/c date is: > 3 days              Patient currently is not medically stable to d/c.   Subjective: Patient seen and examined.  Lying comfortably on the bed.  On room air.  No new complaints.  Alert and following commands.  Objective: Vitals:   11/11/20 1637 11/11/20 2052 11/12/20 0300 11/12/20 1013  BP: 130/77 139/78 114/78 124/73  Pulse: (!) 102 99 99 97  Resp: 17 18 18 18   Temp: 99 F (37.2 C) 99 F (37.2 C) 98.5 F (36.9 C) 99 F (37.2 C)  TempSrc: Oral  Oral   SpO2: 99% 100% 99% 96%  Weight:  116.1 kg    Height:        Intake/Output Summary (Last 24 hours) at 11/12/2020 1326 Last data filed at 11/12/2020 0600 Gross per 24 hour  Intake 780.06 ml  Output 100 ml  Net 680.06 ml   Filed Weights   11/10/20 2102 11/11/20 0515 11/11/20 2052  Weight: 116.9 kg 114.4 kg 116.1 kg    Examination: General exam: Appears calm and comfortable, obese, on room air Respiratory system: Clear to auscultation. Respiratory effort normal. Cardiovascular system: S1 & S2 heard, RRR. No JVD, murmurs, rubs, gallops or clicks.  Bilateral 2+ pitting edema positive Gastrointestinal system: Abdomen is nondistended, soft and nontender. No organomegaly or masses felt. Normal bowel sounds heard. Central nervous system: Alert and following commands.  Right-sided hemiplegia.  Data Reviewed: I have personally reviewed following labs and imaging studies  CBC: Recent Labs  Lab 11/08/20 1540 11/09/20 0026 11/10/20 0306 11/11/20 0052 11/12/20 0303  WBC 7.5 6.6 7.4 6.7 6.4  NEUTROABS 5.8 5.2 5.5 4.8 4.5   HGB 8.8* 8.6* 8.7* 8.9* 8.0*  HCT 27.3* 26.6* 27.1* 28.7* 26.0*  MCV 83.7 83.6 84.4 85.2 84.7  PLT 297 309 335 344 494   Basic Metabolic Panel: Recent Labs  Lab 11/08/20 1540 11/09/20 0026 11/10/20 0306 11/11/20 0052 11/12/20 0303  NA 138 139 137 137 136  K 3.8 3.8 3.9 4.3 4.2  CL 103 104 104 105 106  CO2 25 24 23 22 22   GLUCOSE 129* 166* 110* 95 94  BUN 40* 40* 41* 36* 37*  CREATININE 3.71* 3.69* 3.47* 3.52* 3.44*  CALCIUM 8.4* 8.3* 8.7* 8.6* 8.2*  MG 2.4 2.2 2.2 2.0 1.9  PHOS 4.2 3.9 4.0 4.3 3.8   GFR: Estimated Creatinine Clearance: 30.1 mL/min (A) (by C-G formula based on SCr of 3.44 mg/dL (H)). Liver Function Tests: Recent Labs  Lab 11/06/20 0324 11/06/20 1839 11/08/20 1540 11/09/20 0026 11/10/20 0306 11/11/20 0052 11/12/20 0303  AST 58*  --   --   --   --  43*  --   ALT 54*  --   --   --   --  37  --   ALKPHOS 86  --   --   --   --  64  --   BILITOT 1.0  --   --   --   --  1.3*  --  PROT 6.0*  --   --   --   --  7.5  --   ALBUMIN 2.4*   < > 2.4* 2.3* 2.4* 2.4* 2.2*   < > = values in this interval not displayed.   No results for input(s): LIPASE, AMYLASE in the last 168 hours. No results for input(s): AMMONIA in the last 168 hours. Coagulation Profile: No results for input(s): INR, PROTIME in the last 168 hours. Cardiac Enzymes: No results for input(s): CKTOTAL, CKMB, CKMBINDEX, TROPONINI in the last 168 hours. BNP (last 3 results) No results for input(s): PROBNP in the last 8760 hours. HbA1C: No results for input(s): HGBA1C in the last 72 hours. CBG: Recent Labs  Lab 11/11/20 1121 11/11/20 1639 11/11/20 2151 11/12/20 0637 11/12/20 1145  GLUCAP 117* 132* 105* 110* 125*   Lipid Profile: No results for input(s): CHOL, HDL, LDLCALC, TRIG, CHOLHDL, LDLDIRECT in the last 72 hours. Thyroid Function Tests: No results for input(s): TSH, T4TOTAL, FREET4, T3FREE, THYROIDAB in the last 72 hours. Anemia Panel: No results for input(s): VITAMINB12,  FOLATE, FERRITIN, TIBC, IRON, RETICCTPCT in the last 72 hours. Sepsis Labs: No results for input(s): PROCALCITON, LATICACIDVEN in the last 168 hours.  No results found for this or any previous visit (from the past 240 hour(s)).    Radiology Studies: No results found.  Scheduled Meds: . (feeding supplement) PROSource Plus  30 mL Oral BID WC  . docusate  100 mg Oral BID  . famotidine  20 mg Oral QHS  . feeding supplement  237 mL Oral TID BM  . insulin aspart  0-15 Units Subcutaneous TID WC  . insulin aspart  0-5 Units Subcutaneous QHS  . multivitamin  1 tablet Oral QHS  . polyethylene glycol  17 g Oral Daily  . sodium chloride flush  10-40 mL Intracatheter Q12H   Continuous Infusions: . sodium chloride Stopped (11/07/20 0923)  . ampicillin (OMNIPEN) IV 2 g (11/12/20 0521)     LOS: 13 days   Time spent: 40  minutes   Olando Willems Loann Quill, MD [facility]  If 7PM-7AM, please contact night-coverage [email or web address] 11/12/2020, 1:26 PM

## 2020-11-12 NOTE — Progress Notes (Signed)
[facility] Progress Note   Assessment/ Plan:   1. 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  Renal US showed atrophic right kidney with mildly dilated collecting system, stable left perinephric hematoma, no left sided hydronephrosis. No need for replacement of nephrostomy tube at this time. 1. Started on CRRT due to severe oliguric AKI on 11/27/21and stopped 11/05/20 2. Transitioned to IHD on 11/06/20 with some drop in BP but otherwise tolerated it well. 3. Monitor for recovery.  Maintain dialysis catheter.  No need for HD today given stable kidney function.  If no dialysis required by Monday then can remove catheter 2. Severe Septic shock from Enterococcus faecalis bacteremia- presumably urinary source. Abx per ID and primary team.TEE negative for vegetations. 1. 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 no adequate hydronephrosis for PCN placement 3. Left perinephric hematoma- continue to follow H/H and transfuse prn. 4. Anemia of critical illness/ABLA- transfuse prn 5. Thrombocytopenia- likely due to critical illness. Anticoagulation on hold. Platelets improving. 6. Hyperkalemia- improved with CRRT initiation on 10/30/20.  7. Hypophosphatemia- follow and replace as needed. 8. Acute hypoxemic respiratory failure-extubated and doing well 9. AMS- EEGnonspecific. improved   Subjective:   No acute events, no new complaints. Urine output charted 0.4L   Objective:   BP 124/73 (BP Location: Left Arm)   Pulse 97   Temp 99 F (37.2 C)   Resp 18   Ht 6\' 1"  (1.854 m)   Wt 116.1 kg   SpO2 96%    BMI 33.77 kg/m   Intake/Output Summary (Last 24 hours) at 11/12/2020 1403 Last data filed at 11/12/2020 0600 Gross per 24 hour  Intake 780.06 ml  Output 100 ml  Net 680.06 ml   Weight change: -0.803 kg  Physical Exam: General: Middle aged male, NAD, laying in bed HEENT: Left IJ in place Cardiac: RRR, no m/r/g Pulmonary: CTABL, no wheezing, rhonchi, rales Extremity: Dependent edema Neuro: Right-sided hemiplegia, following commands  Imaging: No results found.  Labs: BMET Recent Labs  Lab 11/07/20 0407 11/07/20 1606 11/08/20 1540 11/09/20 0026 11/10/20 0306 11/11/20 0052 11/12/20 0303  NA 137 138 138 139 137 137 136  K 3.7 3.7 3.8 3.8 3.9 4.3 4.2  CL 99 100 103 104 104 105 106  CO2 27 25 25 24 23 22 22   GLUCOSE 114* 124* 129* 166* 110* 95 94  BUN 32* 33* 40* 40* 41* 36* 37*  CREATININE 2.61* 3.09* 3.71* 3.69* 3.47* 3.52* 3.44*  CALCIUM 8.2* 8.2* 8.4* 8.3* 8.7* 8.6* 8.2*  PHOS 2.9 3.7 4.2 3.9 4.0 4.3 3.8   CBC Recent Labs  Lab 11/09/20 0026 11/10/20 0306 11/11/20 0052 11/12/20 0303  WBC 6.6 7.4 6.7 6.4  NEUTROABS 5.2 5.5 4.8 4.5  HGB 8.6* 8.7* 8.9* 8.0*  HCT 26.6* 27.1* 28.7* 26.0*  MCV 83.6 84.4 85.2 84.7  PLT 309 335 344 337    Medications:    . (feeding supplement) PROSource Plus  30 mL Oral BID WC  . docusate  100 mg Oral BID  . famotidine  20 mg Oral QHS  . feeding supplement  237 mL Oral TID BM  . insulin aspart  0-15 Units Subcutaneous TID WC  . insulin aspart  0-5 Units  Subcutaneous QHS  . multivitamin  1 tablet Oral QHS  . polyethylene glycol  17 g Oral Daily  . sodium chloride flush  10-40 mL Intracatheter Q12H

## 2020-11-13 LAB — CBC WITH DIFFERENTIAL/PLATELET
Abs Immature Granulocytes: 0.01 10*3/uL (ref 0.00–0.07)
Basophils Absolute: 0 10*3/uL (ref 0.0–0.1)
Basophils Relative: 1 %
Eosinophils Absolute: 0.2 10*3/uL (ref 0.0–0.5)
Eosinophils Relative: 3 %
HCT: 25.8 % — ABNORMAL LOW (ref 39.0–52.0)
Hemoglobin: 7.8 g/dL — ABNORMAL LOW (ref 13.0–17.0)
Immature Granulocytes: 0 %
Lymphocytes Relative: 20 %
Lymphs Abs: 1.1 10*3/uL (ref 0.7–4.0)
MCH: 25.8 pg — ABNORMAL LOW (ref 26.0–34.0)
MCHC: 30.2 g/dL (ref 30.0–36.0)
MCV: 85.4 fL (ref 80.0–100.0)
Monocytes Absolute: 0.5 10*3/uL (ref 0.1–1.0)
Monocytes Relative: 9 %
Neutro Abs: 3.7 10*3/uL (ref 1.7–7.7)
Neutrophils Relative %: 67 %
Platelets: 347 10*3/uL (ref 150–400)
RBC: 3.02 MIL/uL — ABNORMAL LOW (ref 4.22–5.81)
RDW: 16.9 % — ABNORMAL HIGH (ref 11.5–15.5)
WBC: 5.5 10*3/uL (ref 4.0–10.5)
nRBC: 0 % (ref 0.0–0.2)

## 2020-11-13 LAB — RENAL FUNCTION PANEL
Albumin: 2.1 g/dL — ABNORMAL LOW (ref 3.5–5.0)
Anion gap: 8 (ref 5–15)
BUN: 36 mg/dL — ABNORMAL HIGH (ref 8–23)
CO2: 22 mmol/L (ref 22–32)
Calcium: 8.4 mg/dL — ABNORMAL LOW (ref 8.9–10.3)
Chloride: 108 mmol/L (ref 98–111)
Creatinine, Ser: 3.5 mg/dL — ABNORMAL HIGH (ref 0.61–1.24)
GFR, Estimated: 19 mL/min — ABNORMAL LOW (ref >60)
Glucose, Bld: 116 mg/dL — ABNORMAL HIGH (ref 70–99)
Phosphorus: 3.8 mg/dL (ref 2.5–4.6)
Potassium: 4.2 mmol/L (ref 3.5–5.1)
Sodium: 138 mmol/L (ref 135–145)

## 2020-11-13 LAB — GLUCOSE, CAPILLARY
Glucose-Capillary: 96 mg/dL (ref 70–99)
Glucose-Capillary: 138 mg/dL — ABNORMAL HIGH (ref 70–99)
Glucose-Capillary: 113 mg/dL — ABNORMAL HIGH (ref 70–99)
Glucose-Capillary: 98 mg/dL (ref 70–99)

## 2020-11-13 LAB — MAGNESIUM: Magnesium: 1.9 mg/dL (ref 1.7–2.4)

## 2020-11-13 NOTE — Progress Notes (Signed)
[facility] Progress Note   Assessment/ Plan:   1. 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  Renal US showed atrophic right kidney with mildly dilated collecting system, stable left perinephric hematoma, no left sided hydronephrosis. No need for replacement of nephrostomy tube at this time. 1. Started on CRRT due to severe oliguric AKI on 11/27/21and stopped 11/05/20 2. Transitioned to IHD on 11/06/20 (last treatment) with some drop in BP but otherwise tolerated it well. 3. Kidney function stable/recovered. No need for HD today given stable kidney function. Can remove dialysis catheter 2. Severe Septic shock from Enterococcus faecalis bacteremia- presumably urinary source. Abx per ID and primary team.TEE negative for vegetations. 1. 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 no adequate hydronephrosis for PCN placement 3. Left perinephric hematoma- continue to follow H/H and transfuse prn. 4. Anemia of critical illness/ABLA- transfuse prn 5. Thrombocytopenia- likely due to critical illness. Anticoagulation on hold. Platelets improving. 6. Hyperkalemia- improved with CRRT initiation on 10/30/20.  7. Hypophosphatemia- follow and replace as needed. 8. Acute hypoxemic respiratory failure-extubated and doing well 9. AMS- EEGnonspecific. improved   Subjective:   No acute events, no new complaints. Urine output charted 1.4L   Objective:   BP 125/80 (BP Location: Left Arm)   Pulse 98   Temp 99.6 F (37.6 C) (Oral)   Resp 18   Ht 6\' 1"  (1.854 m)   Wt 115.9 kg   SpO2 100%   BMI 33.71 kg/m    Intake/Output Summary (Last 24 hours) at 11/13/2020 1151 Last data filed at 11/13/2020 0700 Gross per 24 hour  Intake 780 ml  Output 1400 ml  Net -620 ml   Weight change: -0.2 kg  Physical Exam: General: Middle aged male, NAD, laying in bed HEENT: Left IJ in place Cardiac: RRR, no m/r/g Pulmonary: CTABL, no wheezing, rhonchi, rales Extremity: Dependent edema Neuro: Right-sided hemiplegia, following commands  Imaging: No results found.  Labs: BMET Recent Labs  Lab 11/07/20 1606 11/08/20 1540 11/09/20 0026 11/10/20 0306 11/11/20 0052 11/12/20 0303 11/13/20 0502  NA 138 138 139 137 137 136 138  K 3.7 3.8 3.8 3.9 4.3 4.2 4.2  CL 100 103 104 104 105 106 108  CO2 25 25 24 23 22 22 22   GLUCOSE 124* 129* 166* 110* 95 94 116*  BUN 33* 40* 40* 41* 36* 37* 36*  CREATININE 3.09* 3.71* 3.69* 3.47* 3.52* 3.44* 3.50*  CALCIUM 8.2* 8.4* 8.3* 8.7* 8.6* 8.2* 8.4*  PHOS 3.7 4.2 3.9 4.0 4.3 3.8 3.8   CBC Recent Labs  Lab 11/10/20 0306 11/11/20 0052 11/12/20 0303 11/13/20 0502  WBC 7.4 6.7 6.4 5.5  NEUTROABS 5.5 4.8 4.5 3.7  HGB 8.7* 8.9* 8.0* 7.8*  HCT 27.1* 28.7* 26.0* 25.8*  MCV 84.4 85.2 84.7 85.4  PLT 335 344 337 347    Medications:    . (feeding supplement) PROSource Plus  30 mL Oral BID WC  . docusate  100 mg Oral BID  . famotidine  20 mg Oral QHS  . feeding supplement  237 mL Oral TID BM  . insulin aspart  0-15 Units Subcutaneous TID WC  . insulin aspart  0-5 Units Subcutaneous QHS  . multivitamin  1 tablet Oral  QHS  . polyethylene glycol  17 g Oral Daily  . sodium chloride flush  10-40 mL Intracatheter Q12H

## 2020-11-13 NOTE — Progress Notes (Signed)
PT Cancellation Note  Patient Details Name: Brendan [name] MRN: [id number] DOB: December 19, 1960   Cancelled Treatment:    Reason Eval/Treat Not Completed: PT screened, no needs identified, will sign off. Received new PT eval order 12/10. Pt was evaled and d/c by PT on 12/5. Pt is at baseline level of function which is dependent for ADLs and mobility and resides at [facility]. No medical changes have happened since eval on 12/5. Pt with no acute skilled PT needs at this time. Please re-consult if needed in future.  Kittie Plater, PT, DPT Acute Rehabilitation Services Pager #: [phone] Office #: [phone]    Berline Lopes 11/13/2020, 12:21 PM

## 2020-11-13 NOTE — Progress Notes (Addendum)
OT Cancellation Note  Patient Details Name: Brendan [name] MRN: [id number] DOB: 1961/05/15   Cancelled Treatment:    Reason Eval/Treat Not Completed: OT screened, no needs identified, will sign off.  Pt is a long term NH resident and was evaluated by PT/OT on 12/6.   Those therapists spoke with NH staff and determined that pt was at baseline level of functioning which is bedbound.  When he does get out of bed, he requires total A. And required total A for ADLs except self feeding.  He was able to feed self previously, and in speaking with nursing staff, he is still able to do this.   No acute needs identified.  Nilsa Nutting., OTR/L Acute Rehabilitation Services Pager [phone] Office [phone]   Lucille Passy M 11/13/2020, 9:38 AM

## 2020-11-13 NOTE — Progress Notes (Signed)
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  He was found to have hematoma around left kidney.  In the emergency room, code sepsis was called due to concern for infection.  Treated with IV fluids and antibiotics.  CT scan abdomen pelvis revealed left-sided tube was withdrawn and there was prominent left renal hematoma.  Patient was tachycardic and temperature 104.  Patient was admitted to ICU, was given protamine 50 mg to reverse his heparin subcu apparently used at nursing home. Patient is long-term nursing home resident, has history of extensive DVT previously treated with Lovenox, unable to place IVC filter because of extensive DVT on IVC, unable to tolerate Lovenox.  Also developed subdural hematoma status post craniotomy on 01/26/2020. He has a stroke with right-sided weakness. Blood cultures grew Enterococcus faecalis.  Assessment & Plan:   Severe septic shock secondary to E faecalis bacteremia: -Likely source urine due to obstructive uropathy.  Resolved.  Off of vasopressors. -Repeat blood culture: Negative.  Urine culture: Negative.  TEE: Negative for vegetations. -ID recommended IV ampicillin until 12/25-appreciate help.  Patient remained afebrile, no leukocytosis. -Continue to monitor vitals closely.  Obstructive uropathy in the setting of idiopathic retroperitoneal fibrosis: -with chronic indwelling bilateral PNT's with nonfunctioning left nephrostomy tube and unsuccessful exchange attempt per IR complicated by subcapsular hematoma and progressive hemoperitoneum by CT  scan.  -Plan for replacement of nephrostomy tube however noted to have no adequate hydronephrosis for safe placement.  Currently does not have hydronephrosis.  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   -Patient started on CRRT 11/27-12/3 -Patient started on intermittent hemodialysis-first dialysis was on 12/4.   -Renal ultrasound showed atrophic right kidney with mildly dilated collecting system, stable left perinephric hematoma, no left-sided hydronephrosis.  No need for replacement of nephrostomy tube at this time.   -Nephrology is following-appreciate input-plan was for TTS schedule but urine output remains good.  He remains asymptomatic.  Kidney function remained stable.  Gust with nephrology-no need for hemodialysis.  Can remove hemodialysis catheter today.  Left perinephric hematoma: Monitor H&H closely and transfuse as needed.  Anemia of chronic disease/acute blood loss anemia: H&H slightly trended down from 8.0-7.8.  Continue to monitor H&H closely.  Transfuse as needed.  Acute hypoxemic respiratory failure: Secondary to fluid overload.  Resolved.  Successfully extubated.  Patient is maintaining oxygen saturation on room air.  Acute metabolic encephalopathy: Likely in the setting of sepsis.  Improving.  EEG: Nonspecific.  Hyperkalemia: Improved with CRRT.  Thrombocytopenia: Resolved  Hypophosphatemia: Replenished.  Resolved.  Prediabetes: Blood sugar improving.  A1c: 6.4%.  Continue on sliding scale insulin  History of stroke/ history of prior subdural hematoma status post craniotomy: -With residual right-sided weakness -At baseline -Consult PT/OT-patient t was evaluated by PT/OT on 12/5.  Patient is at baseline level of function.  PT/OT signed off.  History of right lower extremity DVT:  Hold Lovenox in the setting of renal hematoma.  DVT prophylaxis: SCD  Code  Status: Full code Family Communication:  None present at bedside.  Plan of care discussed with patient in length and he verbalized understanding and agreed with it. Disposition Plan: SNF  Consultants:   PCCM  Nephrology  Urology  IR  Infectious disease  Procedures:   Central line  EEG  Antimicrobials:  Ampicillin Status is: Inpatient  Dispo: The patient is from: SNF              Anticipated d/c is to: SNF              Anticipated d/c date is: > 3 days              Patient currently is not medically stable to d/c.   Subjective: Patient seen and examined.  Resting comfortably on the bed.  No new complaints.  Denies nausea, vomiting, abdominal pain, chest pain, shortness of breath, palpitation, orthopnea or PND. Objective: Vitals:   11/12/20 2037 11/13/20 0453 11/13/20 0457 11/13/20 0933  BP: 132/78 130/80 130/70 125/80  Pulse: 95 95 95 98  Resp: 18 18 18 18   Temp: 98.6 F (37 C) 98.1 F (36.7 C) 98.2 F (36.8 C) 99.6 F (37.6 C)  TempSrc:   Oral Oral  SpO2: 97% 97% 97% 100%  Weight:   115.9 kg   Height:        Intake/Output Summary (Last 24 hours) at 11/13/2020 1512 Last data filed at 11/13/2020 0700 Gross per 24 hour  Intake 780 ml  Output 1400 ml  Net -620 ml   Filed Weights   11/11/20 0515 11/11/20 2052 11/13/20 0457  Weight: 114.4 kg 116.1 kg 115.9 kg    Examination: General exam: Appears calm and comfortable, obese, on room air, sleepy but arousable and following commands. Respiratory system: Clear to auscultation. Respiratory effort normal. Cardiovascular system: S1 & S2 heard, RRR. No JVD, murmurs, rubs, gallops or clicks.  Bilateral 2+ pitting edema positive Gastrointestinal system: Abdomen is nondistended, soft and nontender. No organomegaly or masses felt. Normal bowel sounds heard. Central nervous system: Alert and following commands.  Right-sided  hemiplegic.  Data Reviewed: I have personally reviewed following labs and imaging studies  CBC: Recent Labs  Lab 11/09/20 0026 11/10/20 0306 11/11/20 0052 11/12/20 0303 11/13/20 0502  WBC 6.6 7.4 6.7 6.4 5.5  NEUTROABS 5.2 5.5 4.8 4.5 3.7  HGB 8.6* 8.7* 8.9* 8.0* 7.8*  HCT 26.6* 27.1* 28.7* 26.0* 25.8*  MCV 83.6 84.4 85.2 84.7 85.4  PLT 309 335 344 337 716   Basic Metabolic Panel: Recent Labs  Lab 11/09/20 0026 11/10/20 0306 11/11/20 0052 11/12/20 0303 11/13/20 0502  NA 139 137 137 136 138  K 3.8 3.9 4.3 4.2 4.2  CL 104 104 105 106 108  CO2 24 23 22 22 22   GLUCOSE 166* 110* 95 94 116*  BUN 40* 41* 36* 37* 36*  CREATININE 3.69* 3.47* 3.52* 3.44* 3.50*  CALCIUM 8.3* 8.7* 8.6* 8.2* 8.4*  MG 2.2 2.2 2.0 1.9 1.9  PHOS 3.9 4.0 4.3 3.8 3.8   GFR: Estimated Creatinine Clearance: 29.6 mL/min (A) (by C-G formula based on SCr of 3.5 mg/dL (H)). Liver Function Tests: Recent Labs  Lab 11/09/20 0026 11/10/20 0306 11/11/20 0052 11/12/20 0303 11/13/20 0502  AST  --   --  43*  --   --   ALT  --   --  37  --   --   ALKPHOS  --   --  64  --   --  BILITOT  --   --  1.3*  --   --   PROT  --   --  7.5  --   --   ALBUMIN 2.3* 2.4* 2.4* 2.2* 2.1*   No results for input(s): LIPASE, AMYLASE in the last 168 hours. No results for input(s): AMMONIA in the last 168 hours. Coagulation Profile: No results for input(s): INR, PROTIME in the last 168 hours. Cardiac Enzymes: No results for input(s): CKTOTAL, CKMB, CKMBINDEX, TROPONINI in the last 168 hours. BNP (last 3 results) No results for input(s): PROBNP in the last 8760 hours. HbA1C: No results for input(s): HGBA1C in the last 72 hours. CBG: Recent Labs  Lab 11/12/20 1145 11/12/20 1646 11/12/20 2123 11/13/20 0606 11/13/20 1117  GLUCAP 125* 101* 101* 96 138*   Lipid Profile: No results for input(s): CHOL, HDL, LDLCALC, TRIG, CHOLHDL, LDLDIRECT in the last 72 hours. Thyroid Function Tests: No results for input(s): TSH,  T4TOTAL, FREET4, T3FREE, THYROIDAB in the last 72 hours. Anemia Panel: No results for input(s): VITAMINB12, FOLATE, FERRITIN, TIBC, IRON, RETICCTPCT in the last 72 hours. Sepsis Labs: No results for input(s): PROCALCITON, LATICACIDVEN in the last 168 hours.  No results found for this or any previous visit (from the past 240 hour(s)).    Radiology Studies: No results found.  Scheduled Meds: . (feeding supplement) PROSource Plus  30 mL Oral BID WC  . docusate  100 mg Oral BID  . famotidine  20 mg Oral QHS  . feeding supplement  237 mL Oral TID BM  . insulin aspart  0-15 Units Subcutaneous TID WC  . insulin aspart  0-5 Units Subcutaneous QHS  . multivitamin  1 tablet Oral QHS  . polyethylene glycol  17 g Oral Daily  . sodium chloride flush  10-40 mL Intracatheter Q12H   Continuous Infusions: . sodium chloride Stopped (11/07/20 0923)  . ampicillin (OMNIPEN) IV 2 g (11/13/20 1335)     LOS: 14 days   Time spent: 40  minutes   Cuca Benassi Loann Quill, MD [facility]  If 7PM-7AM, please contact night-coverage [email or web address] 11/13/2020, 3:12 PM

## 2020-11-14 LAB — MAGNESIUM: Magnesium: 1.9 mg/dL (ref 1.7–2.4)

## 2020-11-14 LAB — GLUCOSE, CAPILLARY
Glucose-Capillary: 92 mg/dL (ref 70–99)
Glucose-Capillary: 103 mg/dL — ABNORMAL HIGH (ref 70–99)
Glucose-Capillary: 94 mg/dL (ref 70–99)
Glucose-Capillary: 81 mg/dL (ref 70–99)

## 2020-11-14 LAB — CBC WITH DIFFERENTIAL/PLATELET
Abs Immature Granulocytes: 0.02 10*3/uL (ref 0.00–0.07)
Basophils Absolute: 0 10*3/uL (ref 0.0–0.1)
Basophils Relative: 1 %
Eosinophils Absolute: 0.2 10*3/uL (ref 0.0–0.5)
Eosinophils Relative: 3 %
HCT: 25.6 % — ABNORMAL LOW (ref 39.0–52.0)
Hemoglobin: 7.8 g/dL — ABNORMAL LOW (ref 13.0–17.0)
Immature Granulocytes: 0 %
Lymphocytes Relative: 22 %
Lymphs Abs: 1.1 10*3/uL (ref 0.7–4.0)
MCH: 26.1 pg (ref 26.0–34.0)
MCHC: 30.5 g/dL (ref 30.0–36.0)
MCV: 85.6 fL (ref 80.0–100.0)
Monocytes Absolute: 0.4 10*3/uL (ref 0.1–1.0)
Monocytes Relative: 8 %
Neutro Abs: 3.5 10*3/uL (ref 1.7–7.7)
Neutrophils Relative %: 66 %
Platelets: 368 10*3/uL (ref 150–400)
RBC: 2.99 MIL/uL — ABNORMAL LOW (ref 4.22–5.81)
RDW: 16.6 % — ABNORMAL HIGH (ref 11.5–15.5)
WBC: 5.2 10*3/uL (ref 4.0–10.5)
nRBC: 0 % (ref 0.0–0.2)

## 2020-11-14 LAB — RENAL FUNCTION PANEL
Albumin: 2.1 g/dL — ABNORMAL LOW (ref 3.5–5.0)
Anion gap: 9 (ref 5–15)
BUN: 33 mg/dL — ABNORMAL HIGH (ref 8–23)
CO2: 19 mmol/L — ABNORMAL LOW (ref 22–32)
Calcium: 8.6 mg/dL — ABNORMAL LOW (ref 8.9–10.3)
Chloride: 109 mmol/L (ref 98–111)
Creatinine, Ser: 3.4 mg/dL — ABNORMAL HIGH (ref 0.61–1.24)
GFR, Estimated: 20 mL/min — ABNORMAL LOW (ref >60)
Glucose, Bld: 97 mg/dL (ref 70–99)
Phosphorus: 3.8 mg/dL (ref 2.5–4.6)
Potassium: 4.5 mmol/L (ref 3.5–5.1)
Sodium: 137 mmol/L (ref 135–145)

## 2020-11-14 MED ORDER — MYCOPHENOLATE MOFETIL 250 MG PO CAPS
500.0000 mg | ORAL_CAPSULE | Freq: Two times a day (BID) | ORAL | Status: DC
  Administered 2020-11-14 – 2020-11-16 (×4): 500 mg via ORAL
  Filled 2020-11-14 (×4): qty 2

## 2020-11-14 MED ORDER — ATORVASTATIN CALCIUM 40 MG PO TABS
40.0000 mg | ORAL_TABLET | Freq: Every day | ORAL | Status: DC
  Administered 2020-11-14 – 2020-11-15 (×2): 40 mg via ORAL
  Filled 2020-11-14 (×2): qty 1

## 2020-11-14 MED ORDER — TRAMADOL HCL 50 MG PO TABS
50.0000 mg | ORAL_TABLET | Freq: Once | ORAL | Status: AC | PRN
Start: 2020-11-14 — End: 2020-11-14
  Administered 2020-11-14: 50 mg via ORAL
  Filled 2020-11-14: qty 1

## 2020-11-14 MED ORDER — TAMSULOSIN HCL 0.4 MG PO CAPS
0.4000 mg | ORAL_CAPSULE | Freq: Every day | ORAL | Status: DC
  Administered 2020-11-14 – 2020-11-15 (×2): 0.4 mg via ORAL
  Filled 2020-11-14 (×2): qty 1

## 2020-11-14 MED ORDER — HEPARIN SODIUM (PORCINE) 5000 UNIT/ML IJ SOLN
5000.0000 [IU] | Freq: Three times a day (TID) | INTRAMUSCULAR | Status: DC
  Administered 2020-11-14 – 2020-11-15 (×4): 5000 [IU] via SUBCUTANEOUS
  Filled 2020-11-14 (×4): qty 1

## 2020-11-14 MED ORDER — ACETAMINOPHEN 325 MG PO TABS
650.0000 mg | ORAL_TABLET | ORAL | Status: DC | PRN
  Administered 2020-11-14: 650 mg via ORAL
  Filled 2020-11-14: qty 2

## 2020-11-14 NOTE — Progress Notes (Addendum)
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  He was found to have hematoma around left kidney.  In the emergency room, code sepsis was called due to concern for infection.  Treated with IV fluids and antibiotics.  CT scan abdomen pelvis revealed left-sided tube was withdrawn and there was prominent left renal hematoma.  Patient was tachycardic and temperature 104.  Patient was admitted to ICU, was given protamine 50 mg to reverse his heparin subcu apparently used at nursing home. Patient is long-term nursing home resident, has history of extensive DVT previously treated with Lovenox, unable to place IVC filter because of extensive DVT on IVC, unable to tolerate Lovenox.  Also developed subdural hematoma status post craniotomy on 01/26/2020. He has a stroke with right-sided weakness. Blood cultures grew Enterococcus faecalis.  Assessment & Plan:   Severe septic shock secondary to E faecalis bacteremia: -Likely source urine due to obstructive uropathy.  Resolved.  Off of vasopressors. -Repeat blood culture: Negative.  Urine culture: Negative.  TEE: Negative for vegetations. -ID recommended IV ampicillin 2g q8h  until 12/25-appreciate help.  Patient remained afebrile, no leukocytosis. -Continue to monitor vitals closely.  Obstructive uropathy in the setting of idiopathic retroperitoneal fibrosis: -with chronic indwelling bilateral PNT's with nonfunctioning left nephrostomy tube and unsuccessful exchange attempt per IR complicated by subcapsular hematoma and progressive hemoperitoneum  by CT scan.  -Plan for replacement of nephrostomy tube however noted to have no adequate hydronephrosis for safe placement.  Currently does not have hydronephrosis.  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   -Patient started on CRRT 11/27-12/3 -Patient started on intermittent hemodialysis-first dialysis was on 12/4.   -Renal ultrasound showed atrophic right kidney with mildly dilated collecting system, stable left perinephric hematoma, no left-sided hydronephrosis.  No need for replacement of nephrostomy tube at this time.   -Nephrology is following-appreciate input-plan was for TTS schedule but urine output remains good.  He remains asymptomatic.  Kidney function remained stable & slightly improving. Dialysis cathter removed 12/11.  Nephrology signed off -Discussed with nephrology via secure chat-okay to resume CellCept and heparin  from nephrology standpoint.  Left perinephric hematoma: Monitor H&H closely and transfuse as needed.  Anemia of chronic disease/acute blood loss anemia: H&H slightly trended down from 8.0-7.8.  Continue to monitor H&H closely.  Transfuse as needed.  Acute hypoxemic respiratory failure: Secondary to fluid overload.  Resolved.  Successfully extubated on 12/1.  Patient is maintaining oxygen saturation on room air.  Acute metabolic encephalopathy: Likely in the setting of sepsis.  Improving.  EEG: Nonspecific.  Hyperkalemia: Improved with CRRT.  Thrombocytopenia: Resolved  Hypophosphatemia: Replenished.  Resolved.  Prediabetes: Blood sugar improving.  A1c: 6.4%.  Continue on sliding scale insulin  History of stroke/ history of prior subdural hematoma status post craniotomy: -With residual right-sided weakness -At baseline -Consult PT/OT-patient t was evaluated by PT/OT on  12/5.  Patient is at baseline level of function.  PT/OT signed off.  History of  right lower extremity DVT: Resume heparin.  Obesity with BMI of 33: -Evaluated by a nutritionist-continue Ensure Enlive p.o. 3 times daily, Prosource plus twice daily, dysphagia 3 diet and renal MVI.  DVT prophylaxis: Heparin/SCD  Code Status: Full code Family Communication:  None present at bedside.  Plan of care discussed with patient in length and he verbalized understanding and agreed with it. Disposition Plan: SNF  Consultants:   PCCM  Nephrology  Urology  IR  Infectious disease  Procedures:   Central line  EEG  Antimicrobials:  Ampicillin Status is: Inpatient  Dispo: The patient is from: SNF              Anticipated d/c is to: SNF              Anticipated d/c date is: > 3 days              Patient currently is not medically stable to d/c.   Subjective: Patient seen and examined.  Lying comfortably on the bed.  No new complaints.  Tells me that he had good breakfast.  Denies headache, chest pain, shortness of breath, palpitation, orthopnea, PND, nausea or vomiting.  Objective: Vitals:   11/13/20 2117 11/14/20 0452 11/14/20 0500 11/14/20 0900  BP: 125/75 (!) 141/76  134/76  Pulse: 93 99  98  Resp: 16 18  18   Temp: 98.2 F (36.8 C) 98.4 F (36.9 C)  98 F (36.7 C)  TempSrc:  Oral  Oral  SpO2: 97% 100%  100%  Weight: 115.9 kg  115.9 kg   Height:        Intake/Output Summary (Last 24 hours) at 11/14/2020 3343 Last data filed at 11/14/2020 0600 Gross per 24 hour  Intake 1060 ml  Output 1850 ml  Net -790 ml   Filed Weights   11/13/20 0457 11/13/20 2117 11/14/20 0500  Weight: 115.9 kg 115.9 kg 115.9 kg    Examination: General exam: Appears calm and comfortable, on room air, obese, alert and following commands Respiratory system: Clear to auscultation. Respiratory effort normal. Cardiovascular system: S1 & S2 heard, RRR. No JVD, murmurs, rubs, gallops or clicks.   Bilateral 2+ pitting edema positive. Gastrointestinal system: Abdomen is obese, soft and nontender. No organomegaly or masses felt. Normal bowel sounds heard. Central nervous system: Alert and oriented.  Right-sided hemiplegic   Data Reviewed: I have personally reviewed following labs and imaging studies  CBC: Recent Labs  Lab 11/10/20 0306 11/11/20 0052 11/12/20 0303 11/13/20 0502 11/14/20 0405  WBC 7.4 6.7 6.4 5.5 5.2  NEUTROABS 5.5 4.8 4.5 3.7 3.5  HGB 8.7* 8.9* 8.0* 7.8* 7.8*  HCT 27.1* 28.7* 26.0* 25.8* 25.6*  MCV 84.4 85.2 84.7 85.4 85.6  PLT 335 344 337 347 568   Basic Metabolic Panel: Recent Labs  Lab 11/10/20 0306 11/11/20 0052 11/12/20 0303 11/13/20 0502 11/14/20 0405  NA 137 137 136 138 137  K 3.9 4.3 4.2 4.2 4.5  CL 104 105 106 108 109  CO2 23 22 22 22  19*  GLUCOSE 110* 95 94 116* 97  BUN 41* 36* 37* 36* 33*  CREATININE 3.47* 3.52* 3.44* 3.50* 3.40*  CALCIUM 8.7* 8.6* 8.2* 8.4* 8.6*  MG 2.2 2.0 1.9 1.9 1.9  PHOS 4.0 4.3 3.8 3.8 3.8   GFR: Estimated Creatinine Clearance: 30.4 mL/min (A) (by C-G formula based on SCr of 3.4 mg/dL (H)). Liver Function Tests: Recent Labs  Lab 11/10/20 0306 11/11/20 0052 11/12/20 0303 11/13/20 0502 11/14/20 0405  AST  --  43*  --   --   --   ALT  --  37  --   --   --   ALKPHOS  --  64  --   --   --   BILITOT  --  1.3*  --   --   --   PROT  --  7.5  --   --   --   ALBUMIN 2.4* 2.4* 2.2* 2.1* 2.1*   No results for input(s): LIPASE, AMYLASE in the last 168 hours. No results for input(s): AMMONIA in the last 168 hours. Coagulation Profile: No results for input(s): INR, PROTIME in the last 168 hours. Cardiac Enzymes: No results for input(s): CKTOTAL, CKMB, CKMBINDEX, TROPONINI in the last 168 hours. BNP (last 3 results) No results for input(s): PROBNP in the last 8760 hours. HbA1C: No results for input(s): HGBA1C in the last 72 hours. CBG: Recent Labs  Lab 11/13/20 0606 11/13/20 1117 11/13/20 1646  11/13/20 2117 11/14/20 0803  GLUCAP 96 138* 113* 98 92   Lipid Profile: No results for input(s): CHOL, HDL, LDLCALC, TRIG, CHOLHDL, LDLDIRECT in the last 72 hours. Thyroid Function Tests: No results for input(s): TSH, T4TOTAL, FREET4, T3FREE, THYROIDAB in the last 72 hours. Anemia Panel: No results for input(s): VITAMINB12, FOLATE, FERRITIN, TIBC, IRON, RETICCTPCT in the last 72 hours. Sepsis Labs: No results for input(s): PROCALCITON, LATICACIDVEN in the last 168 hours.  No results found for this or any previous visit (from the past 240 hour(s)).    Radiology Studies: No results found.  Scheduled Meds: . (feeding supplement) PROSource Plus  30 mL Oral BID WC  . docusate  100 mg Oral BID  . famotidine  20 mg Oral QHS  . feeding supplement  237 mL Oral TID BM  . insulin aspart  0-15 Units Subcutaneous TID WC  . insulin aspart  0-5 Units Subcutaneous QHS  . multivitamin  1 tablet Oral QHS  . polyethylene glycol  17 g Oral Daily  . sodium chloride flush  10-40 mL Intracatheter Q12H   Continuous Infusions: . sodium chloride Stopped (11/07/20 0923)  . ampicillin (OMNIPEN) IV 2 g (11/14/20 0644)     LOS: 15 days   Time spent: 40  minutes   Brylin Stanislawski Loann Quill, MD [facility]  If 7PM-7AM, please contact night-coverage [email or web address] 11/14/2020, 9:22 AM

## 2020-11-14 NOTE — Progress Notes (Signed)
Brendan [name] Progress Note   Assessment/ Plan:   1. 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  Renal US showed atrophic right kidney with mildly dilated collecting system, stable left perinephric hematoma, no left sided hydronephrosis. No need for replacement of nephrostomy tube at this time. 1. Started on CRRT due to severe oliguric AKI on 11/27/21and stopped 11/05/20 2. Transitioned to IHD on 11/06/20 (last treatment) with some drop in BP but otherwise tolerated it well. 3. Kidney function stable/recovered. Dialysis catheter removed 12/11.  2. Severe Septic shock from Enterococcus faecalis bacteremia- presumably urinary source. Abx per ID and primary team.TEE negative for vegetations. 1. 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 no adequate hydronephrosis for PCN placement 3. Left perinephric hematoma- continue to follow H/H and transfuse prn. 4. Anemia of critical illness/ABLA- transfuse prn 5. Thrombocytopenia- likely due to critical illness. Anticoagulation on hold. Platelets improved. 6. Hyperkalemia- improved with CRRT initiation on 10/30/20.  7. Hypophosphatemia- follow and replace as needed. 8. Acute hypoxemic respiratory failure-extubated and doing well 9. AMS- EEGnonspecific. improved  Will sign off from nephrology perspective for now.  Thank you for allowing [place] to participate in the care of this patient.  Please call with any questions/concerns.  Brendan Quint, MD [name]   Subjective:   No acute events, no new complaints. Urine output charted 1.8L   Objective:    BP 134/76 (BP Location: Left Arm)   Pulse 98   Temp 98 F (36.7 C) (Oral)   Resp 18   Ht 6\' 1"  (1.854 m)   Wt 115.9 kg   SpO2 100%   BMI 33.71 kg/m   Intake/Output Summary (Last 24 hours) at 11/14/2020 1429 Last data filed at 11/14/2020 0600 Gross per 24 hour  Intake 860 ml  Output 1850 ml  Net -990 ml   Weight change: 0.003 kg  Physical Exam: General: Middle aged male, NAD, laying in bed HEENT: Left IJ in place Cardiac: RRR, no m/r/g Pulmonary: CTABL, no wheezing, rhonchi, rales Extremity: Dependent edema Neuro: Right-sided hemiplegia, following commands  Imaging: No results found.  Labs: BMET Recent Labs  Lab 11/08/20 1540 11/09/20 0026 11/10/20 0306 11/11/20 0052 11/12/20 0303 11/13/20 0502 11/14/20 0405  NA 138 139 137 137 136 138 137  K 3.8 3.8 3.9 4.3 4.2 4.2 4.5  CL 103 104 104 105 106 108 109  CO2 25 24 23 22 22 22  19*  GLUCOSE 129* 166* 110* 95 94 116* 97  BUN 40* 40* 41* 36* 37* 36* 33*  CREATININE 3.71* 3.69* 3.47* 3.52* 3.44* 3.50* 3.40*  CALCIUM 8.4* 8.3* 8.7* 8.6* 8.2* 8.4* 8.6*  PHOS 4.2 3.9 4.0 4.3 3.8 3.8 3.8   CBC Recent Labs  Lab 11/11/20 0052 11/12/20 0303 11/13/20 0502 11/14/20 0405  WBC 6.7 6.4 5.5 5.2  NEUTROABS 4.8 4.5 3.7 3.5  HGB 8.9* 8.0* 7.8* 7.8*  HCT 28.7* 26.0* 25.8* 25.6*  MCV 85.2 84.7 85.4 85.6  PLT 344 337 347 368    Medications:    . (feeding supplement) PROSource Plus  30 mL Oral BID WC  . docusate  100 mg Oral BID  . famotidine  20 mg Oral QHS  . feeding supplement  237 mL Oral  TID BM  . insulin aspart  0-15 Units Subcutaneous TID WC  . insulin aspart  0-5 Units Subcutaneous QHS  . multivitamin  1 tablet Oral QHS  . polyethylene glycol  17 g Oral Daily  . sodium chloride flush  10-40 mL Intracatheter Q12H

## 2020-11-14 NOTE — Plan of Care (Signed)
  Problem: Safety: Goal: Ability to remain free from injury will improve Outcome: Progressing   Problem: Health Behavior/Discharge Planning: Goal: Ability to manage health-related needs will improve Outcome: Progressing   Problem: Clinical Measurements: Goal: Ability to maintain clinical measurements within normal limits will improve Outcome: Progressing   Problem: Skin Integrity: Goal: Risk for impaired skin integrity will decrease Outcome: Progressing

## 2020-11-14 NOTE — Progress Notes (Signed)
Phone call from patient's provider with request to follow up with [facility] regarding patient's return to the facility on IV antibiotics. Phone call to Tharon Aquas, spoke with nurse supervisor Colletta Maryland who confirmed ability to manage this.   Spoke with Admissions nurse Haynes Bast who also confirmed ability to manage patient on IV on antibiotics "as long as he has a good access line".  She will review patient's chart in the morning. Melissa Venton contact # [phone]. Patient's provider informed.   [address] LCSW Transition of Care [phone]

## 2020-11-15 ENCOUNTER — Inpatient Hospital Stay: Payer: Self-pay

## 2020-11-15 LAB — CBC WITH DIFFERENTIAL/PLATELET
Abs Immature Granulocytes: 0.01 10*3/uL (ref 0.00–0.07)
Basophils Absolute: 0 10*3/uL (ref 0.0–0.1)
Basophils Relative: 0 %
Eosinophils Absolute: 0.1 10*3/uL (ref 0.0–0.5)
Eosinophils Relative: 2 %
HCT: 25.6 % — ABNORMAL LOW (ref 39.0–52.0)
Hemoglobin: 7.8 g/dL — ABNORMAL LOW (ref 13.0–17.0)
Immature Granulocytes: 0 %
Lymphocytes Relative: 15 %
Lymphs Abs: 0.7 10*3/uL (ref 0.7–4.0)
MCH: 26.2 pg (ref 26.0–34.0)
MCHC: 30.5 g/dL (ref 30.0–36.0)
MCV: 85.9 fL (ref 80.0–100.0)
Monocytes Absolute: 0.4 10*3/uL (ref 0.1–1.0)
Monocytes Relative: 8 %
Neutro Abs: 3.7 10*3/uL (ref 1.7–7.7)
Neutrophils Relative %: 75 %
Platelets: 376 10*3/uL (ref 150–400)
RBC: 2.98 MIL/uL — ABNORMAL LOW (ref 4.22–5.81)
RDW: 16.6 % — ABNORMAL HIGH (ref 11.5–15.5)
WBC: 5 10*3/uL (ref 4.0–10.5)
nRBC: 0 % (ref 0.0–0.2)

## 2020-11-15 LAB — GLUCOSE, CAPILLARY
Glucose-Capillary: 105 mg/dL — ABNORMAL HIGH (ref 70–99)
Glucose-Capillary: 109 mg/dL — ABNORMAL HIGH (ref 70–99)
Glucose-Capillary: 117 mg/dL — ABNORMAL HIGH (ref 70–99)
Glucose-Capillary: 95 mg/dL (ref 70–99)

## 2020-11-15 LAB — RENAL FUNCTION PANEL
Albumin: 2.2 g/dL — ABNORMAL LOW (ref 3.5–5.0)
Anion gap: 9 (ref 5–15)
BUN: 32 mg/dL — ABNORMAL HIGH (ref 8–23)
CO2: 19 mmol/L — ABNORMAL LOW (ref 22–32)
Calcium: 8.6 mg/dL — ABNORMAL LOW (ref 8.9–10.3)
Chloride: 110 mmol/L (ref 98–111)
Creatinine, Ser: 3.21 mg/dL — ABNORMAL HIGH (ref 0.61–1.24)
GFR, Estimated: 21 mL/min — ABNORMAL LOW (ref >60)
Glucose, Bld: 116 mg/dL — ABNORMAL HIGH (ref 70–99)
Phosphorus: 3.7 mg/dL (ref 2.5–4.6)
Potassium: 4.6 mmol/L (ref 3.5–5.1)
Sodium: 138 mmol/L (ref 135–145)

## 2020-11-15 LAB — SARS CORONAVIRUS 2 BY RT PCR (HOSPITAL ORDER, PERFORMED IN ~~LOC~~ HOSPITAL LAB): SARS Coronavirus 2: NEGATIVE

## 2020-11-15 LAB — MAGNESIUM: Magnesium: 1.8 mg/dL (ref 1.7–2.4)

## 2020-11-15 NOTE — Progress Notes (Signed)
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  He was found to have hematoma around left kidney.  In the emergency room, code sepsis was called due to concern for infection.  Treated with IV fluids and antibiotics.  CT scan abdomen pelvis revealed left-sided tube was withdrawn and there was prominent left renal hematoma.  Patient was tachycardic and temperature 104.  Patient was admitted to ICU, was given protamine 50 mg to reverse his heparin subcu apparently used at nursing home. Patient is long-term nursing home resident, has history of extensive DVT previously treated with Lovenox, unable to place IVC filter because of extensive DVT on IVC, unable to tolerate Lovenox.  Also developed subdural hematoma status post craniotomy on 01/26/2020. He has a stroke with right-sided weakness. Blood cultures grew Enterococcus faecalis.  Assessment & Plan:   Severe septic shock secondary to E faecalis bacteremia: -Likely source urine due to obstructive uropathy.  Resolved.  Off of vasopressors. -Repeat blood culture: Negative.  Urine culture: Negative.  TEE: Negative for vegetations. -ID recommended IV ampicillin 2g q8h  until 12/25-appreciate help.  Patient remained afebrile, no leukocytosis. -Nephrology recommended no PICC line due to underlying CKD stage IV. -Placed consult for IR for central line placement.  Obstructive uropathy in the setting of idiopathic retroperitoneal fibrosis: -with chronic indwelling bilateral PNT's with nonfunctioning left nephrostomy tube and unsuccessful  exchange attempt per IR complicated by subcapsular hematoma and progressive hemoperitoneum by CT scan.  -Plan for replacement of nephrostomy tube however noted to have no adequate hydronephrosis for safe placement.  Currently does not have hydronephrosis.  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   -Patient started on CRRT 11/27-12/3 -Patient started on intermittent hemodialysis-first dialysis was on 12/4.   -Renal ultrasound showed atrophic right kidney with mildly dilated collecting system, stable left perinephric hematoma, no left-sided hydronephrosis.  No need for replacement of nephrostomy tube at this time.   -Nephrology is following-appreciate input-plan was for TTS schedule but urine output remains good.  He remains asymptomatic.  Kidney function remained stable & slightly improving. Dialysis cathter removed 12/11.  Nephrology signed off -Discussed with nephrology via secure chat-okay to resume CellCept and heparin  from nephrology standpoint.  Left perinephric hematoma: Monitor H&H closely and transfuse as needed.  Anemia of chronic disease/acute blood loss anemia: H&H slightly trended down from 8.0-7.8.  Continue to monitor H&H closely.  Transfuse as needed.  Acute hypoxemic respiratory failure: Secondary to fluid overload.  Resolved.  Successfully extubated on 12/1.  Patient is maintaining oxygen saturation on room air.  Acute metabolic encephalopathy: Likely in the setting of sepsis.  Improving.  EEG: Nonspecific.  Hyperkalemia: Improved with CRRT.  Thrombocytopenia: Resolved  Hypophosphatemia: Replenished.  Resolved.  Prediabetes: Blood sugar improving.  A1c: 6.4%.  Continue on sliding scale insulin  History of stroke/ history of prior subdural hematoma status post craniotomy: -With  residual right-sided weakness -At baseline -Consult PT/OT-patient t was evaluated by PT/OT on 12/5.  Patient is at baseline level of function.  PT/OT signed off.  History of right lower extremity DVT: cont. heparin.  Obesity with BMI of 33: -Evaluated by a nutritionist-continue Ensure Enlive p.o. 3 times daily, Prosource plus twice daily, dysphagia 3 diet and renal MVI.  DVT prophylaxis: Heparin/SCD  Code Status: Full code Family Communication:  None present at bedside.  Plan of care discussed with patient in length and he verbalized understanding and agreed with it. Disposition Plan: SNF  Consultants:   PCCM  Nephrology  Urology  IR  Infectious disease  Procedures:   Central line  EEG  Antimicrobials:  Ampicillin Status is: Inpatient  Dispo: The patient is from: SNF              Anticipated d/c is to: SNF              Anticipated d/c date is: 1 day              Patient currently is not medically stable to d/c.   Subjective: Patient seen and examined.  Tells me that his headache has resolved.  No new complaints.  No acute events overnight.  Remained afebrile.  Objective: Vitals:   11/14/20 2102 11/15/20 0500 11/15/20 0511 11/15/20 1104  BP: 134/86  111/87 129/83  Pulse: 90  75 (!) 106  Resp: 16  17 18   Temp: 98.6 F (37 C)  98 F (36.7 C) 98.4 F (36.9 C)  TempSrc:    Oral  SpO2: 97%  98% 100%  Weight: 115.9 kg 115.9 kg    Height:        Intake/Output Summary (Last 24 hours) at 11/15/2020 1549 Last data filed at 11/15/2020 1427 Gross per 24 hour  Intake 1244 ml  Output 600 ml  Net 644 ml   Filed Weights   11/14/20 0500 11/14/20 2102 11/15/20 0500  Weight: 115.9 kg 115.9 kg 115.9 kg    Examination: General exam: Appears calm and comfortable, on room air, obese, alert and following commands Respiratory system: Clear to auscultation. Respiratory effort normal. Cardiovascular system: S1 & S2 heard, RRR. No JVD, murmurs, rubs, gallops or clicks.   Bilateral 2+ pitting edema positive. Gastrointestinal system: Abdomen is obese, soft and nontender. No organomegaly or masses felt. Normal bowel sounds heard. Central nervous system: Alert and oriented.  Right-sided hemiplegic, power 3 out of 5 in bilateral lower extremities.  Data Reviewed: I have personally reviewed following labs and imaging studies  CBC: Recent Labs  Lab 11/11/20 0052 11/12/20 0303 11/13/20 0502 11/14/20 0405 11/15/20 0622  WBC 6.7 6.4 5.5 5.2 5.0  NEUTROABS 4.8 4.5 3.7 3.5 3.7  HGB 8.9* 8.0* 7.8* 7.8* 7.8*  HCT 28.7* 26.0* 25.8* 25.6* 25.6*  MCV 85.2 84.7 85.4 85.6 85.9  PLT 344 337 347 368 270   Basic Metabolic Panel: Recent Labs  Lab 11/11/20 0052 11/12/20 0303 11/13/20 0502 11/14/20 0405 11/15/20 0622  NA 137 136 138 137 138  K 4.3 4.2 4.2 4.5 4.6  CL 105 106 108 109 110  CO2 22 22 22  19* 19*  GLUCOSE 95 94 116* 97 116*  BUN 36* 37* 36* 33* 32*  CREATININE 3.52* 3.44* 3.50* 3.40* 3.21*  CALCIUM 8.6* 8.2* 8.4* 8.6* 8.6*  MG 2.0 1.9 1.9 1.9 1.8  PHOS 4.3 3.8 3.8 3.8 3.7   GFR: Estimated Creatinine Clearance: 32.2 mL/min (A) (by C-G formula based on SCr of 3.21 mg/dL (H)). Liver Function Tests: Recent Labs  Lab 11/11/20 0052 11/12/20 0303  11/13/20 0502 11/14/20 0405 11/15/20 0622  AST 43*  --   --   --   --   ALT 37  --   --   --   --   ALKPHOS 64  --   --   --   --   BILITOT 1.3*  --   --   --   --   PROT 7.5  --   --   --   --   ALBUMIN 2.4* 2.2* 2.1* 2.1* 2.2*   No results for input(s): LIPASE, AMYLASE in the last 168 hours. No results for input(s): AMMONIA in the last 168 hours. Coagulation Profile: No results for input(s): INR, PROTIME in the last 168 hours. Cardiac Enzymes: No results for input(s): CKTOTAL, CKMB, CKMBINDEX, TROPONINI in the last 168 hours. BNP (last 3 results) No results for input(s): PROBNP in the last 8760 hours. HbA1C: No results for input(s): HGBA1C in the last 72 hours. CBG: Recent Labs  Lab  11/14/20 1236 11/14/20 1622 11/14/20 2102 11/15/20 0701 11/15/20 1129  GLUCAP 103* 94 81 105* 109*   Lipid Profile: No results for input(s): CHOL, HDL, LDLCALC, TRIG, CHOLHDL, LDLDIRECT in the last 72 hours. Thyroid Function Tests: No results for input(s): TSH, T4TOTAL, FREET4, T3FREE, THYROIDAB in the last 72 hours. Anemia Panel: No results for input(s): VITAMINB12, FOLATE, FERRITIN, TIBC, IRON, RETICCTPCT in the last 72 hours. Sepsis Labs: No results for input(s): PROCALCITON, LATICACIDVEN in the last 168 hours.  Recent Results (from the past 240 hour(s))  SARS Coronavirus 2 by RT PCR (hospital order, performed in [facility] hospital lab) Nasopharyngeal Nasopharyngeal Swab     Status: None   Collection Time: 11/15/20 12:39 PM   Specimen: Nasopharyngeal Swab  Result Value Ref Range Status   SARS Coronavirus 2 NEGATIVE NEGATIVE Final    Comment: (NOTE) SARS-CoV-2 target nucleic acids are NOT DETECTED.  The SARS-CoV-2 RNA is generally detectable in upper and lower respiratory specimens during the acute phase of infection. The lowest concentration of SARS-CoV-2 viral copies this assay can detect is 250 copies / mL. A negative result does not preclude SARS-CoV-2 infection and should not be used as the sole basis for treatment or other patient management decisions.  A negative result may occur with improper specimen collection / handling, submission of specimen other than nasopharyngeal swab, presence of viral mutation(s) within the areas targeted by this assay, and inadequate number of viral copies (<250 copies / mL). A negative result must be combined with clinical observations, patient history, and epidemiological information.  Fact Sheet for Patients:   StrictlyIdeas.no  Fact Sheet for Healthcare Providers: BankingDealers.co.za  This test is not yet approved or  cleared by the [place] FDA and has been authorized for  detection and/or diagnosis of SARS-CoV-2 by FDA under an Emergency Use Authorization (EUA).  This EUA will remain in effect (meaning this test can be used) for the duration of the COVID-19 declaration under Section 564(b)(1) of the Act, 21 U.S.C. section 360bbb-3(b)(1), unless the authorization is terminated or revoked sooner.  Performed at [facility], [place] [address], [place], [place] [address]       Radiology Studies: [place] EKG SITE RITE  Result Date: 11/15/2020 If Site Rite image not attached, placement could not be confirmed due to current cardiac rhythm.   Scheduled Meds: . (feeding supplement) PROSource Plus  30 mL Oral BID WC  . atorvastatin  40 mg Oral q1800  . docusate  100 mg Oral BID  .  famotidine  20 mg Oral QHS  . feeding supplement  237 mL Oral TID BM  . heparin injection (subcutaneous)  5,000 Units Subcutaneous Q8H  . insulin aspart  0-15 Units Subcutaneous TID WC  . insulin aspart  0-5 Units Subcutaneous QHS  . multivitamin  1 tablet Oral QHS  . mycophenolate  500 mg Oral BID  . polyethylene glycol  17 g Oral Daily  . sodium chloride flush  10-40 mL Intracatheter Q12H  . tamsulosin  0.4 mg Oral QHS   Continuous Infusions: . sodium chloride Stopped (11/07/20 0923)  . ampicillin (OMNIPEN) IV 2 g (11/15/20 1427)     LOS: 16 days   Time spent: 40  minutes   Goerge Mohr Loann Quill, MD [facility]  If 7PM-7AM, please contact night-coverage [email or web address] 11/15/2020, 3:49 PM

## 2020-11-15 NOTE — Plan of Care (Signed)
  Problem: Clinical Measurements: Goal: Ability to maintain clinical measurements within normal limits will improve Outcome: Progressing   Problem: Safety: Goal: Ability to remain free from injury will improve Outcome: Progressing   

## 2020-11-15 NOTE — TOC Initial Note (Addendum)
Transition of Care [facility]) - Initial/Assessment Note    Patient Details  Name: Brendan [name] MRN: [id number] Date of Birth: [birth year]  [facility]) CM/SW Contact:    Sable Feil, LCSW Phone Number: 11/15/2020, 11:35 AM  Clinical Narrative:  Visited with patient at the bedside. Mr. Binion was lying in bed and was awake and alert. When asked, patient responded that he is from [facility] and plans to return there. Patient advised that hie will discharge today. CSW was given permission to contact his sister [name] ([phone]).    CSW talked with Fraser Din [phone]), admissions director at [facility] her that patient medically ready for discharge todayt with IV antibiotics. Per Melissa, they can manage the IV antibiotics. When asked about patient having a legal guardian, Lenna Sciara responded that they have no record of patient having a legal guardian.              Expected Discharge Plan: [place] ([place], [place]) Barriers to Discharge: Barriers Resolved   Patient Goals and CMS Choice Patient states their goals for this hospitalization and ongoing recovery are:: Patient agreeable to returning to [facility] where he is a long-term care resident CMS Medicare.gov Compare Post Acute Care list provided to:: Other (Comment Required) (Medicare.gov list not needed as patient returning to [facility]) Choice offered to / list presented to : NA  Expected Discharge Plan and Services Expected Discharge Plan: [place] ([place], [place]) In-house Referral: Clinical Social Work   Post Acute Care Choice: [place] Living arrangements for the past 2 months: [place] ([place])                                     Prior Living Arrangements/Services Living arrangements for the past 2 months: [place] ([place]) Lives with::  Facility Resident Patient language and need for interpreter reviewed:: No Do you feel safe going back to the place where you live?: Yes      Need for Family Participation in Patient Care: No (Comment) Care giver support system in place?: Yes (comment) (Patient at SNF)   Criminal Activity/Legal Involvement Pertinent to Current Situation/Hospitalization: No - Comment as needed  Activities of Daily Living Home Assistive Devices/Equipment: None ADL Screening (condition at time of admission) Patient's cognitive ability adequate to safely complete daily activities?: Yes Is the patient deaf or have difficulty hearing?: No Does the patient have difficulty seeing, even when wearing glasses/contacts?: No Does the patient have difficulty concentrating, remembering, or making decisions?: No Patient able to express need for assistance with ADLs?: Yes Does the patient have difficulty dressing or bathing?: Yes Independently performs ADLs?: No Communication: Independent Is this a change from baseline?: Pre-admission baseline Dressing (OT): Dependent Is this a change from baseline?: Pre-admission baseline Grooming: Dependent Is this a change from baseline?: Pre-admission baseline Feeding: Needs assistance Is this a change from baseline?: Pre-admission baseline Bathing: Dependent Is this a change from baseline?: Pre-admission baseline Toileting: Dependent Is this a change from baseline?: Pre-admission baseline In/Out Bed: Dependent Is this a change from baseline?: Pre-admission baseline Walks in Home: Dependent (R. side paralysis) Is this a change from baseline?: Pre-admission baseline Does the patient have difficulty walking or climbing stairs?: Yes Weakness of Legs: Right Weakness of Arms/Hands: Right  Permission Sought/Granted   Permission granted to share information with : Yes,  Release of Information Signed  Share Information with NAME: [name]     Permission granted to share  info w Relationship: Sister  Permission granted to share info w Contact Information: [phone]  Emotional Assessment Appearance:: Appears stated age Attitude/Demeanor/Rapport: Other (comment) (Answered CSW's questions - did not initiate conversation) Affect (typically observed): Appropriate Orientation: : Oriented to Self,Oriented to Place,Oriented to  Time,Oriented to Situation Alcohol / Substance Use: Tobacco Use,Alcohol Use,Illicit Drugs (Per H&P, patient has never smoked and does not drink or use illicit drugs) Psych Involvement: No (comment)  Admission diagnosis:  Dehydration [E86.0] Acute cystitis with hematuria [N30.01] AKI (acute kidney injury) ([place]) [N17.9] Hematoma of left kidney, initial encounter [S37.012A] Septic shock ([place]) [A41.9, R65.21] Acute respiratory failure, unspecified whether with hypoxia or hypercapnia (HCC) [J96.00] Leukopenia, unspecified type [D72.819] Patient Active Problem List   Diagnosis Date Noted  . Enterococcal bacteremia 11/04/2020  . Retroperitoneal hematoma 11/04/2020  . Septic shock ([place]) 10/30/2020  . AKI (acute kidney injury) ([place])   . Anorexia symptom   . Advanced care planning/counseling discussion   . Goals of care, counseling/discussion   . Palliative care by specialist   . Subdural hematoma, acute ([place]) 01/26/2020  . SDH (subdural hematoma) ([place]) 01/23/2020  . DVT (deep venous thrombosis) ([place]) 12/19/2019  . Right leg DVT ([place]) 12/18/2019  . Symptomatic anemia 03/22/2018  . History of DVT in adulthood 03/22/2018  . Gross hematuria   . Nephrostomy tube displaced ([place]) 07/19/2017  . Pyelonephritis 02/16/2017  . 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 Coronary atherosclerosis of native coronary artery   . Essential hypertension   . Stroke ([place])   . Diabetes mellitus without complication ([place])   . Chronic kidney disease, stage IV (severe) ([place])   . GERD (gastroesophageal reflux disease)   . Glaucoma   . Hemiparesis affecting  right side as late effect of cerebrovascular accident ([place])   . Enlarged prostate with urinary obstruction   . Constipation    PCP:  Cleda Mccreedy, MD Pharmacy:  No Pharmacies Listed    Social Determinants of Health (SDOH) Interventions  No SDOH interventions requested or needed at this time  Readmission Risk Interventions No flowsheet data found.

## 2020-11-15 NOTE — Progress Notes (Signed)
Patient had nosebleeds this afternoon, napkin with blood spots noted and patient verbalized having some blood from the left nares. MD notified with orders to stop heparin.

## 2020-11-15 NOTE — Progress Notes (Signed)
   11/14/20 2126  Provider Notification  Provider Name/Title Dr. Marlowe Sax  Date Provider Notified 11/14/20  Time Provider Notified 2126  Notification Type Page  Notification Reason Requested by patient/family (HA 7/10 pain level - Tylenol not helping)  Response See new orders  Date of Provider Response 11/14/20   Patient continues to complain of headache.  Rates pain as 7/10.  VS stable.  Tylenol given at 1729 not effective.  Dr. Marlowe Sax paged and made aware.  Order received for Ultram 50 mg PO.  Given at 2217.  Upon reassessment patient is sleeping and in no obvious distress.  Will continue to monitor.  Earleen Reaper RN

## 2020-11-15 NOTE — Plan of Care (Signed)
  Problem: Activity: Goal: Risk for activity intolerance will decrease Outcome: Progressing   Problem: Skin Integrity: Goal: Risk for impaired skin integrity will decrease Outcome: Progressing   

## 2020-11-15 NOTE — NC FL2 (Signed)
[place] LEVEL OF CARE SCREENING TOOL     IDENTIFICATION  [name] Birthdate: 1961/11/12 Sex: male Admission Date (Current Location): 10/29/2020  [facility] and [place] Number:  [place] [facility] (Patient from [place] [facility]) [phone] Facility and Address:  [facility], [place] [address], [place], [place] [address]      Provider Number: [id number]  Attending Physician Name and Address:  Mckinley Jewel, MD  Relative Name and Phone Number:  [name] - [phone]    Current Level of Care: Hospital Recommended Level of Care: [facility]) Prior Approval Number:    Date Approved/Denied:   PASRR Number: [id number]  Discharge Plan: SNF [facility])    Current Diagnoses: Patient Active Problem List   Diagnosis Date Noted  . Enterococcal bacteremia 11/04/2020  . Retroperitoneal hematoma 11/04/2020  . Septic shock ([place]) 10/30/2020  . AKI (acute kidney injury) ([place])   . Anorexia symptom   . Advanced care planning/counseling discussion   . Goals of care, counseling/discussion   . Palliative care by specialist   . Subdural hematoma, acute ([place]) 01/26/2020  . SDH (subdural hematoma) ([place]) 01/23/2020  . DVT (deep venous thrombosis) ([place]) 12/19/2019  . Right leg DVT ([place]) 12/18/2019  . Symptomatic anemia 03/22/2018  . History of DVT in adulthood 03/22/2018  . Gross hematuria   . Nephrostomy tube displaced ([place]) 07/19/2017  . Pyelonephritis 02/16/2017  . 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 Coronary atherosclerosis of native coronary artery   . Essential hypertension   . Stroke ([place])   . Diabetes mellitus without complication ([place])   . Chronic kidney disease, stage IV (severe) ([place])   . GERD (gastroesophageal reflux disease)   . Glaucoma   . Hemiparesis affecting right side as late effect of cerebrovascular accident ([place])   . Enlarged prostate with urinary obstruction    . Constipation     Orientation RESPIRATION BLADDER Height & Weight     Self,Time,Situation,Place  Normal Incontinent,External catheter (External catheter placed 10/30/20) Weight: 255 lb 8.4 oz (115.9 kg) Height:  6\' 1"  (185.4 cm)  BEHAVIORAL SYMPTOMS/MOOD NEUROLOGICAL BOWEL NUTRITION STATUS      Incontinent Diet (DYS 3)  AMBULATORY STATUS COMMUNICATION OF NEEDS Skin   Total Care (Patient was unable to ambulate with PT) Verbally Skin abrasions (Skin tear right buttocks)                       Personal Care Assistance Level of Assistance  Bathing,Feeding,Dressing Bathing Assistance: Maximum assistance Feeding assistance: Limited assistance (Assistance with set-up) Dressing Assistance: Maximum assistance     Functional Limitations Info  Sight,Hearing,Speech Sight Info: Adequate Hearing Info: Adequate Speech Info: Adequate    SPECIAL CARE FACTORS FREQUENCY  PT (By licensed PT),OT (By licensed OT)     PT Frequency: Evaluated 12/5 OT Frequency: Evaluated 12/6            Contractures Contractures Info: Not present    Additional Factors Info  Code Status,Allergies,Insulin Sliding Scale,Isolation Precautions Code Status Info: Full Allergies Info: Chlorhexidine, other   Insulin Sliding Scale Info: 0-5 Units daily at bedtime. 0-15 Units 3 times per day with meals Isolation Precautions Info: Hx ESBL, MDR, or VRE     Current Medications (11/15/2020):  This is the current hospital active medication list Current Facility-Administered Medications  Medication Dose Route Frequency Provider Last Rate Last Admin  . (feeding supplement) PROSource Plus liquid 30 mL  30 mL Oral BID  WC Pahwani, Rinka R, MD   30 mL at 11/15/20 0941  . 0.9 %  sodium chloride infusion   Intravenous PRN Icard, Bradley L, DO   Stopped at 11/07/20 [phone]  . acetaminophen (TYLENOL) tablet 650 mg  650 mg Oral Q4H PRN Pahwani, Rinka R, MD   650 mg at 11/14/20 1729  . ampicillin (OMNIPEN) 2 g in sodium  chloride 0.9 % 100 mL IVPB  2 g Intravenous Q8H Rolla Flatten, [place] at 11/15/20 2423  . atorvastatin (LIPITOR) tablet 40 mg  40 mg Oral q1800 Pahwani, Rinka R, MD   40 mg at 11/14/20 2216  . diphenhydrAMINE (BENADRYL) capsule 50 mg  50 mg Oral QHS PRN Icard, Bradley L, DO   50 mg at 11/14/20 2216  . docusate (COLACE) 50 MG/5ML liquid 100 mg  100 mg Oral BID Joselyn Glassman A, RPH   100 mg at 11/11/20 2104  . famotidine (PEPCID) tablet 20 mg  20 mg Oral QHS Icard, Bradley L, DO   20 mg at 11/14/20 2217  . feeding supplement (ENSURE ENLIVE / ENSURE PLUS) liquid 237 mL  237 mL Oral TID BM Icard, Bradley L, DO   237 mL at 11/15/20 0942  . heparin injection 5,000 Units  5,000 Units Subcutaneous Q8H Pahwani, Rinka R, MD   5,000 Units at 11/15/20 0629  . insulin aspart (novoLOG) injection 0-15 Units  0-15 Units Subcutaneous TID WC Icard, Bradley L, DO   2 Units at 11/12/20 1253  . insulin aspart (novoLOG) injection 0-5 Units  0-5 Units Subcutaneous QHS Icard, Bradley L, DO      . ipratropium-albuterol (DUONEB) 0.5-2.5 (3) MG/3ML nebulizer solution 3 mL  3 mL Nebulization Q4H PRN Icard, Bradley L, DO      . loperamide (IMODIUM) capsule 2 mg  2 mg Oral TID PRN Barb Merino, MD   2 mg at 11/09/20 1000  . multivitamin (RENA-VIT) tablet 1 tablet  1 tablet Oral QHS Pahwani, Rinka R, MD   1 tablet at 11/14/20 2217  . mycophenolate (CELLCEPT) capsule 500 mg  500 mg Oral BID Pahwani, Rinka R, MD   500 mg at 11/15/20 0943  . polyethylene glycol (MIRALAX / GLYCOLAX) packet 17 g  17 g Oral Daily Joselyn Glassman A, RPH   17 g at 11/13/20 5361  . sodium chloride flush (NS) 0.9 % injection 10-40 mL  10-40 mL Intracatheter Q12H Freddi Starr, MD   10 mL at 11/15/20 0948  . sodium chloride flush (NS) 0.9 % injection 10-40 mL  10-40 mL Intracatheter PRN Freddi Starr, MD   30 mL at 11/05/20 0050  . tamsulosin (FLOMAX) capsule 0.4 mg  0.4 mg Oral QHS Pahwani, Rinka R, MD   0.4 mg at 11/14/20 2217      Discharge Medications: Please see discharge summary for a list of discharge medications.  Relevant Imaging Results:  Relevant Lab Results:   Additional Information [phone]  Sable Feil, LCSW

## 2020-11-16 ENCOUNTER — Inpatient Hospital Stay (HOSPITAL_COMMUNITY): Payer: Medicaid Other

## 2020-11-16 HISTORY — PX: IR FLUORO GUIDE CV LINE RIGHT: IMG2283

## 2020-11-16 HISTORY — PX: IR US GUIDE VASC ACCESS RIGHT: IMG2390

## 2020-11-16 LAB — CBC WITH DIFFERENTIAL/PLATELET
Abs Immature Granulocytes: 0.02 10*3/uL (ref 0.00–0.07)
Basophils Absolute: 0 10*3/uL (ref 0.0–0.1)
Basophils Relative: 0 %
Eosinophils Absolute: 0.1 10*3/uL (ref 0.0–0.5)
Eosinophils Relative: 2 %
HCT: 26.7 % — ABNORMAL LOW (ref 39.0–52.0)
Hemoglobin: 8.5 g/dL — ABNORMAL LOW (ref 13.0–17.0)
Immature Granulocytes: 0 %
Lymphocytes Relative: 17 %
Lymphs Abs: 0.8 10*3/uL (ref 0.7–4.0)
MCH: 27.2 pg (ref 26.0–34.0)
MCHC: 31.8 g/dL (ref 30.0–36.0)
MCV: 85.3 fL (ref 80.0–100.0)
Monocytes Absolute: 0.6 10*3/uL (ref 0.1–1.0)
Monocytes Relative: 12 %
Neutro Abs: 3.2 10*3/uL (ref 1.7–7.7)
Neutrophils Relative %: 69 %
Platelets: 389 10*3/uL (ref 150–400)
RBC: 3.13 MIL/uL — ABNORMAL LOW (ref 4.22–5.81)
RDW: 16.8 % — ABNORMAL HIGH (ref 11.5–15.5)
WBC: 4.7 10*3/uL (ref 4.0–10.5)
nRBC: 0 % (ref 0.0–0.2)

## 2020-11-16 LAB — RENAL FUNCTION PANEL
Albumin: 2.1 g/dL — ABNORMAL LOW (ref 3.5–5.0)
Anion gap: 8 (ref 5–15)
BUN: 33 mg/dL — ABNORMAL HIGH (ref 8–23)
CO2: 20 mmol/L — ABNORMAL LOW (ref 22–32)
Calcium: 8.7 mg/dL — ABNORMAL LOW (ref 8.9–10.3)
Chloride: 110 mmol/L (ref 98–111)
Creatinine, Ser: 3.27 mg/dL — ABNORMAL HIGH (ref 0.61–1.24)
GFR, Estimated: 21 mL/min — ABNORMAL LOW (ref >60)
Glucose, Bld: 106 mg/dL — ABNORMAL HIGH (ref 70–99)
Phosphorus: 3.5 mg/dL (ref 2.5–4.6)
Potassium: 4.6 mmol/L (ref 3.5–5.1)
Sodium: 138 mmol/L (ref 135–145)

## 2020-11-16 LAB — BASIC METABOLIC PANEL
Anion gap: 7 (ref 5–15)
BUN: 34 mg/dL — ABNORMAL HIGH (ref 8–23)
CO2: 21 mmol/L — ABNORMAL LOW (ref 22–32)
Calcium: 8.7 mg/dL — ABNORMAL LOW (ref 8.9–10.3)
Chloride: 109 mmol/L (ref 98–111)
Creatinine, Ser: 3.3 mg/dL — ABNORMAL HIGH (ref 0.61–1.24)
GFR, Estimated: 20 mL/min — ABNORMAL LOW (ref >60)
Glucose, Bld: 109 mg/dL — ABNORMAL HIGH (ref 70–99)
Potassium: 4.7 mmol/L (ref 3.5–5.1)
Sodium: 137 mmol/L (ref 135–145)

## 2020-11-16 LAB — GLUCOSE, CAPILLARY
Glucose-Capillary: 98 mg/dL (ref 70–99)
Glucose-Capillary: 103 mg/dL — ABNORMAL HIGH (ref 70–99)
Glucose-Capillary: 126 mg/dL — ABNORMAL HIGH (ref 70–99)

## 2020-11-16 LAB — MAGNESIUM: Magnesium: 1.8 mg/dL (ref 1.7–2.4)

## 2020-11-16 MED ORDER — LIDOCAINE-EPINEPHRINE (PF) 1 %-1:200000 IJ SOLN
INTRAMUSCULAR | Status: DC | PRN
  Administered 2020-11-16: 20 mL

## 2020-11-16 MED ORDER — PROSOURCE PLUS PO LIQD: 30.0000 mL | Freq: Two times a day (BID) | ORAL | Status: AC

## 2020-11-16 MED ORDER — RENA-VITE PO TABS: 1.0000 | ORAL_TABLET | Freq: Every day | ORAL | 0 refills | Status: AC

## 2020-11-16 MED ORDER — LIDOCAINE-EPINEPHRINE 1 %-1:100000 IJ SOLN
INTRAMUSCULAR | Status: AC
  Filled 2020-11-16: qty 1

## 2020-11-16 MED ORDER — AMPICILLIN IV (FOR PTA / DISCHARGE USE ONLY): 2.0000 g | Freq: Three times a day (TID) | INTRAVENOUS | 0 refills | Status: AC

## 2020-11-16 MED ORDER — ENSURE ENLIVE PO LIQD: 237.0000 mL | Freq: Three times a day (TID) | ORAL | 12 refills | Status: AC

## 2020-11-16 NOTE — Progress Notes (Signed)
Attempt was made twice to give report to RN at [facility].

## 2020-11-16 NOTE — Discharge Summary (Signed)
Physician Discharge Summary  Brendan [name] ERX:[id number] DOB: 11-Apr-1961 DOA: 10/29/2020  PCP: Brendan Mccreedy, MD  Admit date: 10/29/2020 Discharge date: 11/16/2020  Admitted From: SNF Disposition:  SNF  Recommendations for Outpatient Follow-up:  1. Follow-up with PCP in 1 week 2. Repeat CBC and BMP on follow-up visit 3. Continue IV ampicillin 2 g every 8 hour until 12/25 4. 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 He was found to have hematoma around left kidney. In the emergency room, code sepsis was called due to concern for infection. Treated with IV fluids and antibiotics. CT scan abdomen pelvis revealed left-sided tube was withdrawn and there was prominent left renal hematoma. Patient was tachycardic and temperature 104. Patient was admitted to ICU, was given protamine 50 mg to reverse his heparin subcu apparently used at nursing home. Patient is long-term nursing home resident, has history of extensive DVT previously treated with Lovenox, unable to place IVC filter because of extensive DVT on IVC, unable to tolerate Lovenox. Also developed subdural hematoma status post craniotomy on 01/26/2020. He has a stroke with right-sided weakness. Blood cultures grew Enterococcus faecalis.  Severe septic shock secondary to E faecalis bacteremia: -Likely source urine due to obstructive uropathy.  Resolved.  Off of vasopressors. -Repeat blood culture: Negative.  Urine culture: Negative.  TEE: Negative for  vegetations. -ID recommended IV ampicillin 2g q8h  until 12/25-appreciate help.  Patient remained afebrile, no leukocytosis. -Nephrology recommended no PICC line due to underlying CKD stage IV. -IR consulted.  Patient had central line placement on 12/14 without any complications.  Obstructive uropathy in the setting of idiopathic retroperitoneal fibrosis: -with chronic indwelling bilateral PNT's with nonfunctioning left nephrostomy tube and unsuccessful exchange attempt per IR complicated by subcapsular hematoma and progressive hemoperitoneum by CT scan.  -Plan for replacement of nephrostomy tube however noted to have no adequate hydronephrosis for safe placement.  Currently does not have hydronephrosis.  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   -Patient started on CRRT 11/27-12/3 -Patient started on intermittent hemodialysis-first dialysis was on 12/4.   -Renal ultrasound showed atrophic right kidney with mildly dilated collecting system, stable left perinephric hematoma, no left-sided hydronephrosis.  No need for replacement of nephrostomy tube at this time.   -Nephrology is following-appreciate input-plan was for TTS schedule but urine output remains good.  He remains asymptomatic.  Kidney function remained stable & slightly improving. Dialysis cathter removed 12/11.  Nephrology signed off -Discussed with nephrology via secure chat-okay to resume CellCept and heparin  from nephrology standpoint.  Left perinephric hematoma: Monitored H&H closely.  Resumed heparin.  Anemia of chronic disease/acute blood loss anemia: H&H remained stable.  Acute hypoxemic respiratory failure: Secondary to fluid overload.  Resolved.  Successfully extubated on 12/1.  Patient maintained oxygen saturation on room  air.  Acute metabolic encephalopathy: Likely in the setting of sepsis.  EEG: Nonspecific.  History of stroke/ history of prior subdural hematoma status post craniotomy: -  With residual right-sided weakness -At baseline -Consult PT/OT-patient t was evaluated by PT/OT on 12/5.  Patient is at baseline level of function.  PT/OT signed off.  Obesity with BMI of 33: -Evaluated by a nutritionist-continued Ensure Enlive p.o. 3 times daily, Prosource plus twice daily, dysphagia 3 diet and renal MVI.  Hyperkalemia: Resolved with CRRT.  Thrombocytopenia: Resolved  Hypophosphatemia: Replenished.  Resolved.  Prediabetes:  A1c: 6.4%.  Continued on sliding scale insulin  History of right lower extremity DVT: cont.d heparin.  Disposition: Patient discharged to SNF in a stable condition.  Central line placed without any complications.  He needs IV antibiotics ampicillin 2 g IV every 8 hour until 12/25.  I called patient's Sister Brendan [name] yesterday and discussed about plan of care and she verbalized understanding.  I tried to call her again this morning with no response.  Discharge Diagnoses:  Severe septic shock secondary to Enterococcus faecalis bacteremia Obstructive uropathy in the setting of idiopathic retroperitoneal fibrosis AKI on CKD stage IV Left perinephric hematoma Anemia of chronic disease/acute blood loss anemia Acute hypoxemic respiratory failure Acute metabolic encephalopathy Hyperkalemia Thrombocytopenia Hypophosphatemia Prediabetes History of stroke/history of prior subdural hematoma status post craniotomy History of right lower extremity DVT Obesity with BMI of 33   Discharge Instructions  Discharge Instructions    [facility] pharmacist to adjust dose for Vancomycin, Aminoglycosides and other anti-infective therapies as requested by physician.   Complete by: As directed    [facility] to provide Cath Flo 2mg    Complete by: As directed     Administer for PICC line occlusion and as ordered by physician for other access device issues.   Anaphylaxis Kit: Provided to treat any anaphylactic reaction to the medication being provided to the patient if First Dose or when requested by physician   Complete by: As directed    Epinephrine 1mg /ml vial / amp: Administer 0.3mg  (0.45ml) subcutaneously once for moderate to severe anaphylaxis, nurse to call physician and pharmacy when reaction occurs and call 911 if needed for immediate care   Diphenhydramine 50mg /ml IV vial: Administer 25-50mg  IV/IM PRN for first dose reaction, rash, itching, mild reaction, nurse to call physician and pharmacy when reaction occurs   Sodium Chloride 0.9% NS 576ml IV: Administer if needed for hypovolemic blood pressure drop or as ordered by physician after call to physician with anaphylactic reaction   Change dressing on IV access line weekly and PRN   Complete by: As directed    Discharge instructions   Complete by: As directed    Follow-up with PCP in 1 week Repeat CBC, BMP on follow-up visit Continue IV ampicillin until 12/25. Continue with central line care   Flush IV access with Sodium Chloride 0.9% and Heparin 10 units/ml or 100 units/ml   Complete by: As directed    Home infusion instructions - [facility]   Complete by: As directed    Instructions: Flush IV access with Sodium Chloride 0.9% and Heparin 10units/ml or 100units/ml   Change dressing on IV access line: Weekly and PRN   Instructions Cath Flo 2mg : Administer for PICC Line occlusion and as ordered by physician for other access device   [facility] pharmacist to adjust dose for: Vancomycin, Aminoglycosides and other anti-infective therapies as requested by physician   Increase activity slowly   Complete by: As directed    Method of administration may be changed at the discretion of home infusion pharmacist based upon assessment of the patient and/or caregiver's ability  to  self-administer the medication ordered   Complete by: As directed    No wound care   Complete by: As directed      Allergies as of 11/16/2020      Reactions   Chlorhexidine    Other    TB serum-reaction unknown. Provided via [facility] records      Medication List    STOP taking these medications   ibuprofen 800 MG tablet Commonly known as: ADVIL     TAKE these medications   (feeding supplement) PROSource Plus liquid Take 30 mLs by mouth 2 (two) times daily with a meal.   feeding supplement Liqd Take 237 mLs by mouth 3 (three) times daily between meals.   acetaminophen 325 MG tablet Commonly known as: TYLENOL Take 650 mg by mouth every 8 (eight) hours as needed for moderate pain or headache.   ampicillin  IVPB Inject 2 g into the vein every 8 (eight) hours for 23 days. Indication:  Enterococcal bacteremia First Dose: Yes Last Day of Therapy:  11/27/2020 Labs - Once weekly:  CBC/D and BMP, Labs - Every other week:  ESR and CRP Method of administration: Ambulatory Pump (Continuous Infusion) Method of administration may be changed at the discretion of home infusion pharmacist based upon assessment of the patient and/or caregiver's ability to self-administer the medication ordered.   Caltrate 600 1500 (600 Ca) MG Tabs tablet Generic drug: calcium carbonate Take 1 tablet by mouth 2 (two) times daily.   cetaphil lotion Apply 1 application topically every 12 (twelve) hours as needed for dry skin. Apply to arms, legs, and feet   Cholecalciferol 25 MCG (1000 UT) tablet Take 2,000 Units by mouth daily.   ezetimibe 10 MG tablet Commonly known as: ZETIA Take 10 mg by mouth every evening.   famotidine 20 MG tablet Commonly known as: PEPCID Take 1 tablet (20 mg total) by mouth at bedtime.   feeding supplement (PRO-STAT SUGAR FREE 64) Liqd Take 30 mLs by mouth 3 (three) times daily.   Fish Oil 1000 MG Caps Take 1,000 mg by mouth in the morning and at bedtime.   heparin 5000  UNIT/ML injection Inject 1 mL (5,000 Units total) into the skin every 8 (eight) hours. What changed: additional instructions   HYDROcodone-acetaminophen 5-325 MG tablet Commonly known as: NORCO/VICODIN Take 1 tablet by mouth every 6 (six) hours as needed for pain.   multivitamin Tabs tablet Take 1 tablet by mouth at bedtime.   mycophenolate 500 MG tablet Commonly known as: CELLCEPT Take 500 mg by mouth 2 (two) times daily.   polyethylene glycol 17 g packet Commonly known as: MIRALAX / GLYCOLAX Take 17 g by mouth daily as needed for mild constipation. What changed: additional instructions   senna 8.6 MG Tabs tablet Commonly known as: SENOKOT Take 8.6 mg by mouth daily.   simvastatin 80 MG tablet Commonly known as: ZOCOR Take 80 mg by mouth at bedtime.   sodium bicarbonate 650 MG tablet Take 650 mg by mouth 3 (three) times daily.   tamsulosin 0.4 MG Caps capsule Commonly known as: FLOMAX Take 0.4 mg by mouth at bedtime.            Discharge Care Instructions  (From admission, onward)         Start     Ordered   11/16/20 0000  Change dressing on IV access line weekly and PRN  (Home infusion instructions - [facility] )        11/16/20  Margate, Andrew N, DO Follow up on 11/24/2020.   Specialties: Internal Medicine, Infectious Diseases Why: 2:15 pm appointment. Please arrive 15 minutes early to register.  Contact information: [address] [place] [place] [address] [phone]              Allergies  Allergen Reactions  . Chlorhexidine   . Other     TB serum-reaction unknown. Provided via [facility] records     Consultations:  Urology  Nephrology  ID  PCCM   Procedures/Studies: EEG  Result Date: 11/01/2020 Lora Havens, MD     11/01/2020 12:54 PM [name] MRN: [id number] Epilepsy Attending: Lora Havens Referring Physician/Provider: Dr. Ina Homes Date:  11/01/2020 Duration: 23.15 minutes Patient history: 59 year old male with altered mental status and questionable seizure activity.  EEG to evaluate for seizures. Level of alertness: Comatose AEDs during EEG study: None Technical aspects: This EEG study was done with scalp electrodes positioned according to the 10-20 International system of electrode placement. Electrical activity was acquired at a sampling rate of $Remov'500Hz'zcJdRE$  and reviewed with a high frequency filter of $RemoveB'70Hz'fOYRKITA$  and a low frequency filter of $RemoveB'1Hz'CQwjHTNA$ . EEG data were recorded continuously and digitally stored. Description: EEG showed continuous generalized 6 to 7 Hz theta slowing as well as intermittent generalized 2 to 3 Hz delta slowing.  Sharp transients were seen in vertex region.  Hyperventilation and photic stimulation were not performed.   ABNORMALITY -Continuous slow, generalized IMPRESSION: This study is suggestive of severe diffuse encephalopathy, nonspecific etiology.  No seizures or definite epileptiform discharges were seen throughout the recording. 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 Please assess left-sided perinephric hematoma as well as for development of left-sided hydronephrosis. EXAM: CT ABDOMEN AND PELVIS WITHOUT CONTRAST TECHNIQUE: Multidetector CT imaging of the abdomen and pelvis was performed following the standard protocol without IV contrast. COMPARISON:  CT abdomen and pelvis-10/31/2020; 10/30/2020 Attempted fluoroscopic and CT guided left-sided nephrostomy catheter replacement/placement-10/30/2020 Fluoroscopic guided left-sided nephrostomy catheter  exchange-08/19/2020 FINDINGS: Lower chest: Limited visualization of lower thorax demonstrates interval increase in size of trace/small bilateral effusions and associated worsening bibasilar consolidative opacities. Scattered air bronchograms are seen within the left lower lobe. Borderline cardiomegaly. Diffuse decreased attenuation of the intra cardiac blood pool suggestive of anemia. Coronary artery calcifications. No pericardial effusion. Hepatobiliary: Normal hepatic contour. Normal noncontrast appearance of the gallbladder given degree distention. No radiopaque gallstones. No ascites. Pancreas: Normal noncontrast appearance of the pancreas. Spleen: Normal noncontrast appearance of the spleen. Adrenals/Urinary Tract: While challenging to accurately measure, the left-sided perinephric hematoma is grossly unchanged compared to CT scan performed before attempted left-sided nephrostomy catheter replacement/placement, currently measuring 3.8 x 9.7 cm (axial image 43, series 3; coronal image 87, series 6), previously, 4.6 x 9.9 cm. There is a small amount of air again seen within the perinephric hematoma likely the sequela of attempted replacement/placement. The amount of blood within the dependent portion of pericolic gutter has subjectively reduced in the interval (represent image 41, series 3). There has been no development of left-sided hydronephrosis suggestive of little to no functionality of  the left kidney versus incomplete occlusion of the ureter. The right kidney remains markedly atrophic again without evidence of right-sided urinary obstruction. Normal noncontrast appearance the bilateral adrenal glands. Normal appearance of the urinary bladder given underdistention. Stomach/Bowel: Appropriately position rectal tube. Unremarkable colonic stool burden. No evidence of enteric obstruction. Normal noncontrast appearance of the terminal ileum and appendix. A minimal amount of residual contrast is seen within the  tip of the appendix. No pneumoperitoneum, pneumatosis or portal venous gas. Vascular/Lymphatic: Redemonstrated aneurysmal dilatation of the infrarenal abdominal aorta measuring approximately 3.2 cm in diameter (image 59, series 3). Redemonstrated second parental soft tissue about the aorta and retroperitoneum compatible with provided history of retroperitoneal fibrosis, grossly unchanged. Reproductive: Normal appearance the prostate gland. No free fluid the pelvic cul-de-sac. Other: Diffuse body wall anasarca. Multiple subcutaneous nodules are identified about the undersurface of the abdominal pannus, likely at the location of subcutaneous medication administration. Musculoskeletal: No acute or aggressive osseous abnormalities. 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 IMPRESSION: 1. Unchanged left-sided perinephric hematoma compared to CT scan performed prior to attempted left-sided nephrostomy catheter replacement/placement with interval reduction in the amount of blood products within the dependent portion of the left pararenal space. 2. No development of left-sided pelvicaliectasis despite lack of left-sided nephrostomy catheter - this finding suggests either lack of functionality left kidney or in complete occlusion of the left ureter. 3. Chronic atrophy of the right kidney, again without evidence of urinary obstruction. 4. Similar findings of retroperitoneal fibrosis. 5. Appropriate positioned rectal tube without evidence of enteric obstruction. 6. Redemonstrated abdominal aortic aneurysm measuring 3.2 cm in diameter. 3.0 - 3.4 cm Abdominal Aortic Aneurysm (ICD10-I71.9). Recommend follow-up aortic ultrasound in 3 years. This recommendation follows ACR consensus guidelines: White Paper of the ACR Incidental Findings Committee II on Vascular  Findings. J Am Coll Radiol 2013; 10:789-794. 7. Aortic Atherosclerosis (ICD10-I70.0). Electronically Signed   By: Sandi Mariscal M.D.   On: 11/04/2020 08:31   CT ABDOMEN PELVIS WO CONTRAST  Result Date: 10/31/2020 CLINICAL DATA:  Follow-up left perinephric hematoma. EXAM: CT ABDOMEN AND PELVIS WITHOUT CONTRAST TECHNIQUE: Multidetector CT imaging of the abdomen and pelvis was performed following the standard protocol without IV contrast. COMPARISON:  10/30/2020 FINDINGS: Lower chest: Small bilateral pleural effusions with overlying subsegmental atelectasis. Hepatobiliary: No focal liver abnormality identified. Gallbladder unremarkable. No signs of biliary ductal dilatation Pancreas: Unremarkable. No pancreatic ductal dilatation or surrounding inflammatory changes. Spleen: Normal in size without focal abnormality. Adrenals/Urinary Tract: Normal appearance of the adrenal glands. Asymmetric atrophy of the right kidney is again noted. Excreted contrast material is identified within the right renal collecting system. Left kidney subcapsular hematoma is again noted. This measures 10.7 x 7.1 cm, image 60/6. Previously 10.4 x 6.6 cm. There is surrounding perinephric hemorrhage with signs of mildly progressive hemoperitoneum. Collection of high attenuation material anterior to the mid right kidney is identified measuring 4.6 x 2.7 cm, image 42/3 and likely represents extravasated contrast material from attempted percutaneous nephrostomy tube placement performed yesterday. Concentrated IV contrast material noted within the urinary bladder. Stomach/Bowel: There is an indwelling nasogastric tube with tip in the distal stomach. High contrast material noted within the stomach and proximal duodenum. No dilated loops of large or small bowel. Vascular/Lymphatic: Aortic atherosclerosis. 3.5 cm infrarenal abdominal aortic aneurysm is again noted. Signs of retroperitoneal fibrosis is again identified with increased retroperitoneal  soft tissue  surrounding the abdominal aorta and encasing bilateral ureters. Reproductive: Prostate is unremarkable. Other: High attenuation peritoneal fluid consistent with hemoperitoneum. This appears increased in volume when compared with 10/30/2020. Musculoskeletal: No acute or significant osseous findings. IMPRESSION: 1. Mild increase in size of left kidney subcapsular hematoma with signs of progressive hemoperitoneum. Hemoperitoneum is again noted and is also mildly increased in volume from previous study. 2. Collection of high attenuation material anterior to the mid right kidney likely represents extravasated contrast material. 3. Signs of retroperitoneal fibrosis with increased retroperitoneal soft tissue surrounding the abdominal aorta and encasing bilateral ureters. 4. Small bilateral pleural effusions with overlying subsegmental atelectasis. 5. 3.5 cm infrarenal abdominal aortic aneurysm. Aortic aneurysm NOS (ICD10-I71.9). 6. Aortic atherosclerosis. Aortic Atherosclerosis (ICD10-I70.0). Critical Value/emergent results were called by telephone at the time of interpretation on 10/31/2020 at 10:17 am to provider [facility] , who verbally acknowledged these results. Electronically Signed   By: Kerby Moors M.D.   On: 10/31/2020 10:18   CT ABDOMEN PELVIS WO CONTRAST  Result Date: 10/30/2020 CLINICAL DATA:  Acute abdominal pain. Nausea and vomiting. Fever. Blood in the nephrostomy tube. EXAM: CT ABDOMEN AND PELVIS WITHOUT CONTRAST TECHNIQUE: Multidetector CT imaging of the abdomen and pelvis was performed following the standard protocol without IV contrast. COMPARISON:  03/22/2018 FINDINGS: Lower chest: Lung bases are clear. Hepatobiliary: No focal liver abnormality is seen. No gallstones, gallbladder wall thickening, or biliary dilatation. Pancreas: Unremarkable. No pancreatic ductal dilatation or surrounding inflammatory changes. Spleen: Normal in size without focal abnormality. Adrenals/Urinary  Tract: A percutaneous nephrostomy tube is present. The pigtail demonstrates a shallow location within the kidney suggesting that it has withdrawn from the renal pelvis. The left kidney is diffusely heterogeneous in appearance with enlargement, heterogeneous hyperattenuation consistent with parenchymal hemorrhage, and small amounts of gas. There is extensive stranding/hematoma surrounding the left kidney, extending to the anterior and posterior pararenal fascia and into the retroperitoneum and adjacent mesentery. This may indicate vascular injury resulting from displacement of the nephrostomy tube. Alternately, trauma or spontaneous hemorrhage could be the cause of hematoma. The right kidney is atrophic. Bladder is decompressed. Stomach/Bowel: Stomach is grossly unremarkable. Small bowel and colon are diffusely decompressed. No focal abnormalities identified. Vascular/Lymphatic: There is a peripherally calcified abdominal aortic aneurysm measuring 3.6 cm in diameter, unchanged since prior study. Periaortic retroperitoneal stranding and soft tissue prominence with somewhat nodular contours. This may represent retroperitoneal fibrosis, confluent lymphadenopathy, or aortitis. Appearances are similar to the prior study. Reproductive: Prostate is unremarkable. Other: No free air in the abdomen. Abdominal wall musculature appears intact. Infiltration in the anterior subcutaneous fat could represent contusions, varices, or cellulitis. Musculoskeletal: No destructive bone lesions. Degenerative changes in the hips. 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 IMPRESSION: 1. Left percutaneous nephrostomy tube is present. The pigtail demonstrates a shallow location within the kidney suggesting that it has withdrawn from the renal pelvis. 2. Prominent left renal hematoma with extensive stranding and hematoma in the pararenal spaces and apparently extending into the mesentery. 3.  Periaortic retroperitoneal stranding and soft tissue prominence suggesting retroperitoneal fibrosis, confluent lymphadenopathy, or aortitis. Appearances are similar to the prior study. 4. 3.6 cm peripherally calcified abdominal aortic aneurysm, unchanged since prior study. 5. 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 6. Infiltration in the anterior subcutaneous fat could represent contusions, varices, or cellulitis. Aortic Atherosclerosis (ICD10-I70.0). These results were called by telephone at the time of interpretation on 10/30/2020 at 3:11 am  to provider Ripley Fraise , who verbally acknowledged these results. Electronically Signed   By: Lucienne Capers M.D.   On: 10/30/2020 03:16   CT HEAD WO CONTRAST  Result Date: 10/31/2020 CLINICAL DATA:  Cerebral hemorrhage. EXAM: CT HEAD WITHOUT CONTRAST TECHNIQUE: Contiguous axial images were obtained from the base of the skull through the vertex without intravenous contrast. COMPARISON:  CT head November 27 21. FINDINGS: Brain: Postsurgical changes of left left-sided craniotomy. Similar size of an underlying subdural hemorrhage, which measures up to 1.4 cm in thickness. Single locule within this collection, unchanged. Similar associated mass effect without substantial midline shift. Similar appearance of encephalomalacia within the left frontal lobe white matter. No hydrocephalus. No evidence of new/acute large vascular territory infarct. Vascular: Calcific atherosclerosis. Skull: Left-sided craniotomy. Sinuses/Orbits: Similar complete opacification of bilateral maxillary sinuses with mineralization in the left maxillary sinus. Maxillary sinus opacification is mildly expansile with surrounding bony thickening, suggesting chronicity. Mucosal thickening of ethmoid air cells and sphenoid sinuses. Other: No mastoid effusions. IMPRESSION: 1. No substantial change in left left frontal craniotomy with subjacent 1.4 cm thick  underlying subdural hematoma. Similar so shaded mass effect. 2. Left frontal e white matter encephalomalacia. 3. Similar findings of chronic bilateral maxillary sinusitis, as detailed above. Electronically Signed   By: Margaretha Sheffield MD   On: 10/31/2020 09:51   CT Head Wo Contrast  Result Date: 10/30/2020 CLINICAL DATA:  Nausea and vomiting, fever, and blood in the nephrostomy tube starting today. EXAM: CT HEAD WITHOUT CONTRAST TECHNIQUE: Contiguous axial images were obtained from the base of the skull through the vertex without intravenous contrast. COMPARISON:  02/02/2020 FINDINGS: Brain: Subacute subdural hematoma in the left frontal region. Maximal depth measures about 1.5 cm. Mildly increased maximal thickness since the previous study, but overall, the hematoma is decreased in size since prior study. Subdural gas is also decreased. Encephalomalacia in the left frontal lobe. Improving left-to-right midline shift. Changes are all consistent with postoperative change. No ventricular dilatation. No developing acute intracranial changes. Vascular: Mild intracranial arterial vascular calcifications. Skull: Postoperative left frontotemporal parietal craniotomy with plate and screw fixation of the bone flap. No acute depressed skull fractures. Sinuses/Orbits: Expansile filling of the maxillary antra with extension into the right nasopharynx. Bone remodeling and internal calcification. No change since previous study. Changes consistent with chronic sinusitis, possibly with polyposis. Calcifications suggest atypical infection, possibly fungal superinfection. Other: None. IMPRESSION: 1. Postoperative left frontotemporal parietal craniotomy. Postoperative changes with underlying subdural hematoma, subdural gas, and encephalomalacia. Postoperative changes are decreasing since prior study. 2. Expansile opacification of the maxillary antra with internal calcifications suggesting chronic inflammatory process, possibly  fungal superinfection. No change. Electronically Signed   By: Lucienne Capers M.D.   On: 10/30/2020 03:23   US RENAL  Result Date: 11/07/2020 CLINICAL DATA:  Acute kidney injury EXAM: RENAL / URINARY TRACT ULTRASOUND COMPLETE COMPARISON:  CT dated 11/03/2020 FINDINGS: Right Kidney: Renal measurements: 8.3 x 3.4 x 4 cm = volume: 58 mL. The right kidney is atrophic with cortical thinning and increased cortical echogenicity. There is mild right-sided hydronephrosis. Left Kidney: Renal measurements: 12.1 x 6.3 x 4.7 cm = volume: 189 mL. Again noted is a left-sided perinephric/subcapsular hematoma. There is no definite left-sided hydronephrosis. There is increased cortical echogenicity. Bladder: Appears normal for degree of bladder distention. Other: None. IMPRESSION: 1. Atrophic right kidney with mild collecting system dilatation. 2. Again noted is a left perinephric hematoma. No left-sided hydronephrosis. Electronically Signed   By: Jamie Kato.D.  On: 11/07/2020 19:08   IR Fluoro Guide CV Line Right  Result Date: 11/16/2020 INDICATION: 59 year old male with history of bacteremia requiring central venous access for long-term antibiotics. EXAM: TUNNELED CENTRAL VENOUS HEMODIALYSIS CATHETER PLACEMENT WITH ULTRASOUND AND FLUOROSCOPIC GUIDANCE MEDICATIONS: None. ANESTHESIA/SEDATION: Local anesthesia only. FLUOROSCOPY TIME:  0 minutes 18 seconds (3 mGy). COMPLICATIONS: None immediate. PROCEDURE: Informed written consent was obtained from the patient after a discussion of the risks, benefits, and alternatives to treatment. Questions regarding the procedure were encouraged and answered. The right neck and chest were prepped with chlorhexidine in a sterile fashion, and a sterile drape was applied covering the operative field. Maximum barrier sterile technique with sterile gowns and gloves were used for the procedure. A timeout was performed prior to the initiation of the procedure. After creating a small  venotomy incision, a 21 gauge micropuncture kit was utilized to access the internal jugular vein. Real-time ultrasound guidance was utilized for vascular access including the acquisition of a permanent ultrasound image documenting patency of the accessed vessel. A Rosen wire was advanced to the level of the IVC and the micropuncture sheath was exchanged for an 8 Fr dilator. A 5 French tunneled central venous catheter measuring 25 cm from tip to cuff was tunneled in a retrograde fashion from the anterior chest wall to the venotomy incision. The catheter was then placed through the peel-away sheath with the catheter tip ultimately positioned within the right atrium. Final catheter positioning was confirmed and documented with a spot radiographic image. The catheter aspirates and flushes normally. The catheter exit site was secured with a Prolene retention suture. The venotomy incision was closed with Dermabond. Sterile dressings were applied. The patient tolerated the procedure well without immediate post procedural complication. IMPRESSION: Successful placement of 25 cm tip to cuff tunneled central venous catheter via the right internal jugular vein with catheter tip terminating within the right atrium. The catheter is ready for immediate use. Ruthann Cancer, MD [facility] Radiology Electronically Signed   By: Ruthann Cancer MD   On: 11/16/2020 10:55   IR US Guide Vasc Access Right  Result Date: 11/16/2020 INDICATION: 59 year old male with history of bacteremia requiring central venous access for long-term antibiotics. EXAM: TUNNELED CENTRAL VENOUS HEMODIALYSIS CATHETER PLACEMENT WITH ULTRASOUND AND FLUOROSCOPIC GUIDANCE MEDICATIONS: None. ANESTHESIA/SEDATION: Local anesthesia only. FLUOROSCOPY TIME:  0 minutes 18 seconds (3 mGy). COMPLICATIONS: None immediate. PROCEDURE: Informed written consent was obtained from the patient after a discussion of the risks,  benefits, and alternatives to treatment. Questions regarding the procedure were encouraged and answered. The right neck and chest were prepped with chlorhexidine in a sterile fashion, and a sterile drape was applied covering the operative field. Maximum barrier sterile technique with sterile gowns and gloves were used for the procedure. A timeout was performed prior to the initiation of the procedure. After creating a small venotomy incision, a 21 gauge micropuncture kit was utilized to access the internal jugular vein. Real-time ultrasound guidance was utilized for vascular access including the acquisition of a permanent ultrasound image documenting patency of the accessed vessel. A Rosen wire was advanced to the level of the IVC and the micropuncture sheath was exchanged for an 8 Fr dilator. A 5 French tunneled central venous catheter measuring 25 cm from tip to cuff was tunneled in a retrograde fashion from the anterior chest wall to the venotomy incision. The catheter was then placed through the peel-away sheath with the catheter tip ultimately positioned within the right atrium.  Final catheter positioning was confirmed and documented with a spot radiographic image. The catheter aspirates and flushes normally. The catheter exit site was secured with a Prolene retention suture. The venotomy incision was closed with Dermabond. Sterile dressings were applied. The patient tolerated the procedure well without immediate post procedural complication. IMPRESSION: Successful placement of 25 cm tip to cuff tunneled central venous catheter via the right internal jugular vein with catheter tip terminating within the right atrium. The catheter is ready for immediate use. 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 EXAM: 1. Fluoroscopic guided left nephrostomy tube check 2. Attempted fluoroscopic and ultrasound-guided nephrostomy track recanalization 3. Limited CT abdomen 4. Attempted CT-guided nephrostomy placement COMPARISON:  CT abdomen pelvis from 10/30/2020 and IR nephrostomy exchange from 08/19/2020 CONTRAST:  80 mL Omnipaque, intravenous FLUOROSCOPY TIME:  Minutes   seconds COMPLICATIONS: None immediate. TECHNIQUE: Informed written consent was obtained from the patient's sister after a discussion of the risks, benefits and alternatives to treatment. Questions regarding the procedure were encouraged and answered. A timeout was performed prior to the initiation of the procedure. The left flank and external portion of existing nephrostomy catheter were prepped and draped in the usual sterile fashion. A sterile drape was applied covering the operative field. Maximum barrier sterile technique with sterile gowns and gloves were used for the procedure. A timeout was performed prior to the initiation of the procedure. A pre procedural spot fluoroscopic image was obtained after contrast was injected via the existing nephrostomy catheter demonstrating withdrawal from the renal parenchyma into the pericapsular space. The tract was attempted to be recannulated with a Glidewire and Kumpe the catheter which was unable to be passed into the collecting system. Due to the coiled nature of the indwelling nephrostomy, it was thought that the pigtail portion may be blocking the entry site of the established tract into the kidney, therefore the indwelling catheter was removed. After additional manipulation of the catheter and wire combination, recanalization of the tract was unable to be performed. Ultrasound was then used to examine the left  flank for potential new nephrostomy tube placement. Due to body habitus and overlying gas, the left kidney was not visible by ultrasound. The decision was made to transfer the patient to the CT scanner for further attempt. The patient was positioned prone on the CT scanner. The left flank was prepped and draped in standard fashion. A sterile drape was applied covering the operative field. An additional time-out was performed prior to initiation of this portion of the procedure. Limited CT of the abdomen demonstrated trace residual contrast within the existing nephrostomy tract and contrast layering along the anterior aspect of the renal capsule with partial extension into the peritoneum. There is no evidence of hydronephrosis. The procedure was planned with CT. At the needle entry site, local anesthesia was administered 1% lidocaine. Deeper local anesthesia was provided to the oblique musculature with a 22 gauge spinal needle. A 22 gauge Chiba needle was then directed under intermittent fluoroscopic CT guidance to the level of the collecting system. After numerous attempts, the collecting system was unable to be cannulated. Gentle hand injection of contrast was unable to opacify the collecting  system convincingly. A wire would not pass into the collecting system. After permission of the ICU team in light of the patient's renal function, 80 mL Omnipaque 300 were injected intravenously through the patient is central catheter to perform a CT IVP. Limited sequence imaging at the level of the inferior pole of the kidney was acquired at 1, 2, 5, and 10 minutes. There is no evidence of excretion of contrast material within the collecting system. At this point, the procedure was terminated and the patient was returned to the intensive care unit. The patient tolerated the procedure well without immediate postprocedural complication. FINDINGS: The existing nephrostomy catheter is withdrawn into the perirenal space.  Unsuccessful attempt at established track recanalization with fluoroscopic or ultrasound guidance. Unsuccessful attempt at percutaneous nephrostomy tube placement with CT guidance. CT IVP demonstrates severely diminished renal function without nephrogenic phase or excretion of contrast in the collecting system after 10 minutes. There is no evidence of hydronephrosis. IMPRESSION: 1. Indwelling nephrostomy tube retracted into the perirenal space. 2. Unsuccessful attempt at established nephrostomy track recanalization under fluoroscopic ultrasound guidance. 3. Unsuccessful attempt at percutaneous nephrostomy tube placement under CT guidance due to decompression of the collecting system. Limited CT abdomen demonstrates severely diminished renal function without nephrogenic phase or collecting system target due to no evidence of contrast material excretion. PLAN: Repeat noncontrast CT abdomen in 24-48 hours to evaluate for evidence of hydronephrosis and potential target for replacement of right nephrostomy tube. Marliss Coots, MD [facility] Radiology Electronically Signed   By: Marliss Coots MD   On: 10/31/2020 08:31   DG CHEST PORT 1 VIEW  Result Date: 10/30/2020 CLINICAL DATA:  Central venous catheter placement EXAM: PORTABLE CHEST 1 VIEW COMPARISON:  10/30/2020 FINDINGS: Endotracheal and enteric tubes as well as right central venous catheter are unchanged in position. A new left central venous catheter has been placed with tip over the mid SVC region, oriented towards the side wall of the vessel. No pneumothorax. Shallow inspiration with atelectasis in the lung bases. Mild cardiac enlargement. No pleural effusions. IMPRESSION: New left central venous catheter with tip over the mid SVC region, oriented towards the side wall of the vessel. No pneumothorax. Otherwise stable appearance of the chest. Electronically Signed   By: Burman Nieves M.D.   On: 10/30/2020  22:58   DG CHEST PORT 1 VIEW  Result Date: 10/30/2020 CLINICAL DATA:  Hypoxia.  Central catheter placement EXAM: PORTABLE CHEST 1 VIEW COMPARISON:  October 30, 2020 study obtained earlier in the day FINDINGS: Central catheter tip is in the superior vena cava. Endotracheal tube tip is 4.4 cm above the carina. Nasogastric tube tip and side port are below the diaphragm. No pneumothorax. Lungs are clear. Heart is upper normal in size with pulmonary vascularity normal. No adenopathy. No bone lesions. IMPRESSION: Tube and catheter positions as described without pneumothorax. Lungs clear. Stable cardiac silhouette. Electronically Signed   By: Bretta Bang III M.D.   On: 10/30/2020 08:12   DG Chest Port 1 View  Result Date: 10/30/2020 CLINICAL DATA:  Intubation. EXAM: PORTABLE CHEST 1 VIEW COMPARISON:  10/30/2020 FINDINGS: Endotracheal and enteric tubes are unchanged in position. Shallow inspiration. Heart size and pulmonary vascularity are normal. Lungs are clear. No change. IMPRESSION: No active disease. Electronically Signed   By: Burman Nieves M.D.   On: 10/30/2020 05:09   DG Chest Port 1 View  Result Date: 10/30/2020 CLINICAL DATA:  Fever. EXAM: PORTABLE CHEST 1 VIEW COMPARISON:  01/24/2020  FINDINGS: The lungs are essentially clear. There is no pneumothorax or large pleural effusion. The trachea is off midline which is felt to be secondary to some degree of patient rotation. The heart size is stable. There is no acute osseous abnormality. IMPRESSION: No active disease. Electronically Signed   By: Constance Holster M.D.   On: 10/30/2020 00:37   ECHOCARDIOGRAM COMPLETE  Result Date: 10/31/2020    ECHOCARDIOGRAM REPORT   Patient Name:   SAAHIR PRUDE  Date of Exam: 10/31/2020 Medical Rec #:  [id number]  Height:       73.0 in Accession #:    [id number] Weight:       276.7 lb Date of Birth:  [birth year] BSA:          2.470 m Patient Age:    12 years   BP:           116/73 mmHg Patient Gender: M           HR:           77 bpm. Exam Location:  Inpatient Procedure: 2D Echo, Cardiac Doppler and Color Doppler Indications:    Endocarditis I38  History:        Patient has no prior history of Echocardiogram examinations.                 Risk Factors:Hypertension and Dyslipidemia. Septic shock,AKI                 (acute kidney injury),Right leg DVT.  Sonographer:    Alvino Chapel RCS Referring Phys: [id number] Candee Furbish  Sonographer Comments: Patient on continuous dialysis and mechancial ventilator during echo. IMPRESSIONS  1. Left ventricular ejection fraction, by estimation, is 65 to 70%. The left ventricle has normal function. The left ventricle has no regional wall motion abnormalities. Left ventricular diastolic parameters are consistent with Grade I diastolic dysfunction (impaired relaxation).  2. Right ventricular systolic function is normal. The right ventricular size is normal.  3. The mitral valve is abnormal. Trivial mitral valve regurgitation.  4. Possible aortic valve vegetation is visualized on the noncoronary cusp vs sclerosis and motion artifact. There is no associated aortic insufficiency. Further imaging with TEE is recommended.  5. Possible small aortic valve mass on the noncoronary cusp which is echogenic.  6. The aortic valve is tricuspid. Aortic valve regurgitation is not visualized. Conclusion(s)/Recommendation(s): Possible valvular vegetation noted on the non-coronary cusp of the aortic valve on this transthoracic echocardiogram. Would recommend a transesophageal echocardiogram to exclude infective endocarditis if clinically indicated. FINDINGS  Left Ventricle: Left ventricular ejection fraction, by estimation, is 65 to 70%. The left ventricle has normal function. The left ventricle has no regional wall motion abnormalities. The left ventricular internal cavity size was normal in size. There is  no left ventricular hypertrophy. Left ventricular diastolic parameters are consistent with Grade I  diastolic dysfunction (impaired relaxation). Indeterminate filling pressures. Right Ventricle: The right ventricular size is normal. No increase in right ventricular wall thickness. Right ventricular systolic function is normal. Left Atrium: Left atrial size was normal in size. Right Atrium: Right atrial size was normal in size. Pericardium: There is no evidence of pericardial effusion. Mitral Valve: The mitral valve is abnormal. There is mild thickening of the mitral valve leaflet(s). Trivial mitral valve regurgitation. Tricuspid Valve: The tricuspid valve is grossly normal. Tricuspid valve regurgitation is trivial. Aortic Valve: The aortic valve is tricuspid. Aortic valve regurgitation is not visualized. A vegetation is seen on the noncoronary. There  is a small aortic valve mass seen on the noncoronary cusp. The AoV mass measures 5 mm x 5 mm. Pulmonic Valve: The pulmonic valve was grossly normal. Pulmonic valve regurgitation is trivial. Aorta: The aortic root and ascending aorta are structurally normal, with no evidence of dilitation. Venous: IVC assessment for right atrial pressure unable to be performed due to mechanical ventilation. IAS/Shunts: No atrial level shunt detected by color flow Doppler.  LEFT VENTRICLE PLAX 2D LVIDd:         4.60 cm  Diastology LVIDs:         2.50 cm  LV e' medial:    6.31 cm/s LV PW:         1.00 cm  LV E/e' medial:  8.7 LV IVS:        1.00 cm  LV e' lateral:   7.62 cm/s LVOT diam:     2.10 cm  LV E/e' lateral: 7.2 LV SV:         48 LV SV Index:   20 LVOT Area:     3.46 cm  RIGHT VENTRICLE RV S prime:     12.70 cm/s TAPSE (M-mode): 2.3 cm LEFT ATRIUM             Index       RIGHT ATRIUM           Index LA diam:        3.90 cm 1.58 cm/m  RA Area:     16.50 cm LA Vol (A2C):   38.5 ml 15.59 ml/m RA Volume:   42.10 ml  17.05 ml/m LA Vol (A4C):   24.9 ml 10.08 ml/m LA Biplane Vol: 31.7 ml 12.83 ml/m  AORTIC VALVE LVOT Vmax:   81.50 cm/s LVOT Vmean:  47.800 cm/s LVOT VTI:    0.140  m  AORTA Ao Root diam: 3.70 cm MITRAL VALVE               TRICUSPID VALVE MV Area (PHT): 3.17 cm    TR Peak grad:   13.5 mmHg MV Decel Time: 239 msec    TR Vmax:        184.00 cm/s MV E velocity: 54.80 cm/s MV A velocity: 84.70 cm/s  SHUNTS MV E/A ratio:  0.65        Systemic VTI:  0.14 m                            Systemic Diam: 2.10 cm Lyman Bishop MD Electronically signed by Lyman Bishop MD Signature Date/Time: 10/31/2020/3:59:55 PM    Final    ECHO TEE  Result Date: 11/03/2020    TRANSESOPHOGEAL ECHO REPORT   Patient Name:   Brendan [name]  Date of Exam: 11/03/2020 Medical Rec #:  [id number]  Height:       73.0 in Accession #:    [id number] Weight:       271.8 lb Date of Birth:  Apr 05, 1961 BSA:          2.451 m Patient Age:    13 years   BP:           96/74 mmHg Patient Gender: M          HR:           102 bpm. Exam Location:  Inpatient Procedure: Transesophageal Echo Indications:    Bacteremia  History:        Patient has prior history of Echocardiogram examinations,  most                 recent 10/31/2020. Risk Factors:Hypertension and Diabetes. H/O                 DVT. Stroke. CKD. GERD.  Sonographer:    Clayton Lefort RDCS (AE) Referring Phys: [id number] Freddi Starr PROCEDURE: After discussion of the risks and benefits of a TEE, an informed consent was obtained from a family member. The transesophogeal probe was passed without difficulty through the esophogus of the patient. Sedation performed by performing physician. The patient developed no complications during the procedure. Bedside TEE. IMPRESSIONS  1. Left ventricular ejection fraction, by estimation, is 60 to 65%. The left ventricle has normal function.  2. Right ventricular systolic function is normal. The right ventricular size is normal.  3. No left atrial/left atrial appendage thrombus was detected.  4. The mitral valve is normal in structure. Trivial mitral valve regurgitation.  5. No evidence of vegetation. The aortic valve is tricuspid. Aortic  valve regurgitation is trivial. Conclusion(s)/Recommendation(s): No evidence of vegetation/infective endocarditis on this transesophageal echocardiogram. FINDINGS  Left Ventricle: Left ventricular ejection fraction, by estimation, is 60 to 65%. The left ventricle has normal function. The left ventricular internal cavity size was normal in size. Right Ventricle: The right ventricular size is normal. No increase in right ventricular wall thickness. Right ventricular systolic function is normal. Left Atrium: Left atrial size was normal in size. No left atrial/left atrial appendage thrombus was detected. Right Atrium: Right atrial size was normal in size. Pericardium: There is no evidence of pericardial effusion. Mitral Valve: The mitral valve is normal in structure. Trivial mitral valve regurgitation. Tricuspid Valve: The tricuspid valve is normal in structure. Tricuspid valve regurgitation is not demonstrated. Aortic Valve: No evidence of vegetation. The aortic valve is tricuspid. Aortic valve regurgitation is trivial. Pulmonic Valve: The pulmonic valve was normal in structure. Pulmonic valve regurgitation is trivial. Aorta: The aortic root is normal in size and structure. IAS/Shunts: No atrial level shunt detected by color flow Doppler. 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 EXAM: 1. Fluoroscopic guided left nephrostomy tube check 2. Attempted fluoroscopic and ultrasound-guided nephrostomy track recanalization 3. Limited CT abdomen 4. Attempted CT-guided nephrostomy placement COMPARISON:  CT abdomen pelvis from 10/30/2020 and IR nephrostomy exchange from  08/19/2020 CONTRAST:  80 mL Omnipaque, intravenous FLUOROSCOPY TIME:  Minutes   seconds COMPLICATIONS: None immediate. TECHNIQUE: Informed written consent was obtained from the patient's sister after a discussion of the risks, benefits and alternatives to treatment. Questions regarding the procedure were encouraged and answered. A timeout was performed prior to the initiation of the procedure. The left flank and external portion of existing nephrostomy catheter were prepped and draped in the usual sterile fashion. A sterile drape was applied covering the operative field. Maximum barrier sterile technique with sterile gowns and gloves were used for the procedure. A timeout was performed prior to the initiation of the procedure. A pre procedural spot fluoroscopic image was obtained after contrast was injected via the existing nephrostomy catheter demonstrating withdrawal from the renal parenchyma into the pericapsular space. The tract was attempted to be recannulated with a Glidewire and Kumpe the catheter which  was unable to be passed into the collecting system. Due to the coiled nature of the indwelling nephrostomy, it was thought that the pigtail portion may be blocking the entry site of the established tract into the kidney, therefore the indwelling catheter was removed. After additional manipulation of the catheter and wire combination, recanalization of the tract was unable to be performed. Ultrasound was then used to examine the left flank for potential new nephrostomy tube placement. Due to body habitus and overlying gas, the left kidney was not visible by ultrasound. The decision was made to transfer the patient to the CT scanner for further attempt. The patient was positioned prone on the CT scanner. The left flank was prepped and draped in standard fashion. A sterile drape was applied covering the operative field. An additional time-out was performed prior to initiation of this portion of the procedure.  Limited CT of the abdomen demonstrated trace residual contrast within the existing nephrostomy tract and contrast layering along the anterior aspect of the renal capsule with partial extension into the peritoneum. There is no evidence of hydronephrosis. The procedure was planned with CT. At the needle entry site, local anesthesia was administered 1% lidocaine. Deeper local anesthesia was provided to the oblique musculature with a 22 gauge spinal needle. A 22 gauge Chiba needle was then directed under intermittent fluoroscopic CT guidance to the level of the collecting system. After numerous attempts, the collecting system was unable to be cannulated. Gentle hand injection of contrast was unable to opacify the collecting system convincingly. A wire would not pass into the collecting system. After permission of the ICU team in light of the patient's renal function, 80 mL Omnipaque 300 were injected intravenously through the patient is central catheter to perform a CT IVP. Limited sequence imaging at the level of the inferior pole of the kidney was acquired at 1, 2, 5, and 10 minutes. There is no evidence of excretion of contrast material within the collecting system. At this point, the procedure was terminated and the patient was returned to the intensive care unit. The patient tolerated the procedure well without immediate postprocedural complication. FINDINGS: The existing nephrostomy catheter is withdrawn into the perirenal space. Unsuccessful attempt at established track recanalization with fluoroscopic or ultrasound guidance. Unsuccessful attempt at percutaneous nephrostomy tube placement with CT guidance. CT IVP demonstrates severely diminished renal function without nephrogenic phase or excretion of contrast in the collecting system after 10 minutes. There is no evidence of hydronephrosis. IMPRESSION: 1. Indwelling nephrostomy tube retracted into the perirenal space. 2. Unsuccessful attempt at established  nephrostomy track recanalization under fluoroscopic ultrasound guidance. 3. Unsuccessful attempt at percutaneous nephrostomy tube placement under CT guidance due to decompression of the collecting system. Limited CT abdomen demonstrates severely diminished renal function without nephrogenic phase or collecting system target due to no evidence of contrast material excretion. PLAN: Repeat noncontrast CT abdomen in 24-48 hours to evaluate for evidence of hydronephrosis and potential target for replacement of right nephrostomy tube. Ruthann Cancer, MD [facility] Radiology Electronically Signed   By: Ruthann Cancer MD   On: 10/31/2020 08:31   [place] EKG SITE RITE  Result Date: 11/15/2020 If Site Rite image not attached, placement could not be confirmed due to current cardiac rhythm.    Subjective: Patient seen and examined.  No new complaints.  No acute events overnight.  Remained afebrile.  Discharge Exam: Vitals:   11/16/20 0442 11/16/20 1100  BP: (!) 142/77 123/81  Pulse: (!) 103 Marland Kitchen)  108  Resp: 18 18  Temp: 99.3 F (37.4 C) 98.6 F (37 C)  SpO2: 97% 98%   Vitals:   11/15/20 2030 11/16/20 0338 11/16/20 0442 11/16/20 1100  BP: (!) 141/79  (!) 142/77 123/81  Pulse: 97  (!) 103 (!) 108  Resp: $Remo'18  18 18  'Ixzxk$ Temp: 98 F (36.7 C)  99.3 F (37.4 C) 98.6 F (37 C)  TempSrc: Oral  Oral Oral  SpO2: 98%  97% 98%  Weight: 116.1 kg 116.1 kg    Height:        General: Pt is alert, awake, not in acute distress, obese, on room air, alert and following commands. Cardiovascular: RRR, S1/S2 +, no rubs, no gallops Respiratory: CTA bilaterally, no wheezing, no rhonchi Abdominal: Soft, NT, ND, bowel sounds + Extremities: Bilateral 1+ pitting edema positive, no cyanosis    The results of significant diagnostics from this hospitalization (including imaging, microbiology, ancillary and laboratory) are listed below for reference.     Microbiology: Recent  Results (from the past 240 hour(s))  SARS Coronavirus 2 by RT PCR (hospital order, performed in [facility] hospital lab) Nasopharyngeal Nasopharyngeal Swab     Status: None   Collection Time: 11/15/20 12:39 PM   Specimen: Nasopharyngeal Swab  Result Value Ref Range Status   SARS Coronavirus 2 NEGATIVE NEGATIVE Final    Comment: (NOTE) SARS-CoV-2 target nucleic acids are NOT DETECTED.  The SARS-CoV-2 RNA is generally detectable in upper and lower respiratory specimens during the acute phase of infection. The lowest concentration of SARS-CoV-2 viral copies this assay can detect is 250 copies / mL. A negative result does not preclude SARS-CoV-2 infection and should not be used as the sole basis for treatment or other patient management decisions.  A negative result may occur with improper specimen collection / handling, submission of specimen other than nasopharyngeal swab, presence of viral mutation(s) within the areas targeted by this assay, and inadequate number of viral copies (<250 copies / mL). A negative result must be combined with clinical observations, patient history, and epidemiological information.  Fact Sheet for Patients:   StrictlyIdeas.no  Fact Sheet for Healthcare Providers: BankingDealers.co.za  This test is not yet approved or  cleared by the [place] FDA and has been authorized for detection and/or diagnosis of SARS-CoV-2 by FDA under an Emergency Use Authorization (EUA).  This EUA will remain in effect (meaning this test can be used) for the duration of the COVID-19 declaration under Section 564(b)(1) of the Act, 21 U.S.C. section 360bbb-3(b)(1), unless the authorization is terminated or revoked sooner.  Performed at [facility], [place] [address], [place], [place] [address]      Labs: BNP (last 3 results) No results for input(s): BNP in the last 8760 hours. Basic Metabolic Panel: Recent Labs  Lab  11/12/20 0303 11/13/20 0502 11/14/20 0405 11/15/20 0622 11/16/20 0133  NA 136 138 137 138 138  137  K 4.2 4.2 4.5 4.6 4.6  4.7  CL 106 108 109 110 110  109  CO2 22 22 19* 19* 20*  21*  GLUCOSE 94 116* 97 116* 106*  109*  BUN 37* 36* 33* 32* 33*  34*  CREATININE 3.44* 3.50* 3.40* 3.21* 3.27*  3.30*  CALCIUM 8.2* 8.4* 8.6* 8.6* 8.7*  8.7*  MG 1.9 1.9 1.9 1.8 1.8  PHOS 3.8 3.8 3.8 3.7 3.5   Liver Function Tests: Recent Labs  Lab 11/11/20 0052 11/12/20 0303 11/13/20 0502 11/14/20 0405 11/15/20 0622 11/16/20 0133  AST 43*  --   --   --   --   --   ALT 37  --   --   --   --   --   ALKPHOS 64  --   --   --   --   --   BILITOT 1.3*  --   --   --   --   --   PROT 7.5  --   --   --   --   --   ALBUMIN 2.4* 2.2* 2.1* 2.1* 2.2* 2.1*   No results for input(s): LIPASE, AMYLASE in the last 168 hours. No results for input(s): AMMONIA in the last 168 hours. CBC: Recent Labs  Lab 11/12/20 0303 11/13/20 0502 11/14/20 0405 11/15/20 0622 11/16/20 0133  WBC 6.4 5.5 5.2 5.0 4.7  NEUTROABS 4.5 3.7 3.5 3.7 3.2  HGB 8.0* 7.8* 7.8* 7.8* 8.5*  HCT 26.0* 25.8* 25.6* 25.6* 26.7*  MCV 84.7 85.4 85.6 85.9 85.3  PLT 337 347 368 376 389   Cardiac Enzymes: No results for input(s): CKTOTAL, CKMB, CKMBINDEX, TROPONINI in the last 168 hours. BNP: Invalid input(s): POCBNP CBG: Recent Labs  Lab 11/15/20 1129 11/15/20 1638 11/15/20 2138 11/16/20 0632 11/16/20 1110  GLUCAP 109* 117* 95 98 103*   D-Dimer No results for input(s): DDIMER in the last 72 hours. Hgb A1c No results for input(s): HGBA1C in the last 72 hours. Lipid Profile No results for input(s): CHOL, HDL, LDLCALC, TRIG, CHOLHDL, LDLDIRECT in the last 72 hours. Thyroid function studies No results for input(s): TSH, T4TOTAL, T3FREE, THYROIDAB in the last 72 hours.  Invalid input(s): FREET3 Anemia work up No results for input(s): VITAMINB12, FOLATE, FERRITIN, TIBC, IRON, RETICCTPCT in the last 72 hours. Urinalysis     Component Value Date/Time   COLORURINE YELLOW 10/30/2020 0009   APPEARANCEUR TURBID (A) 10/30/2020 0009   LABSPEC 1.023 10/30/2020 0009   PHURINE 7.0 10/30/2020 0009   GLUCOSEU NEGATIVE 10/30/2020 0009   HGBUR MODERATE (A) 10/30/2020 0009   BILIRUBINUR NEGATIVE 10/30/2020 0009   KETONESUR NEGATIVE 10/30/2020 0009   PROTEINUR >=300 (A) 10/30/2020 0009   NITRITE NEGATIVE 10/30/2020 0009   LEUKOCYTESUR LARGE (A) 10/30/2020 0009   Sepsis Labs Invalid input(s): PROCALCITONIN,  WBC,  LACTICIDVEN Microbiology Recent Results (from the past 240 hour(s))  SARS Coronavirus 2 by RT PCR (hospital order, performed in [facility] hospital lab) Nasopharyngeal Nasopharyngeal Swab     Status: None   Collection Time: 11/15/20 12:39 PM   Specimen: Nasopharyngeal Swab  Result Value Ref Range Status   SARS Coronavirus 2 NEGATIVE NEGATIVE Final    Comment: (NOTE) SARS-CoV-2 target nucleic acids are NOT DETECTED.  The SARS-CoV-2 RNA is generally detectable in upper and lower respiratory specimens during the acute phase of infection. The lowest concentration of SARS-CoV-2 viral copies this assay can detect is 250 copies / mL. A negative result does not preclude SARS-CoV-2 infection and should not be used as the sole basis for treatment or other patient management decisions.  A negative result may occur with improper specimen collection / handling, submission of specimen other than nasopharyngeal swab, presence of viral mutation(s) within the areas targeted by this assay, and inadequate number of viral copies (<250 copies / mL). A negative result must be combined with clinical observations, patient history, and epidemiological information.  Fact Sheet for Patients:   StrictlyIdeas.no  Fact Sheet for Healthcare Providers: BankingDealers.co.za  This test is not yet approved or  cleared by the [place]  States FDA and has been authorized for detection  and/or diagnosis of SARS-CoV-2 by FDA under an Emergency Use Authorization (EUA).  This EUA will remain in effect (meaning this test can be used) for the duration of the COVID-19 declaration under Section 564(b)(1) of the Act, 21 U.S.C. section 360bbb-3(b)(1), unless the authorization is terminated or revoked sooner.  Performed at [facility], [place] [address], [place], [place] [address]      Time coordinating discharge: Over 30 minutes  SIGNED:   Mckinley Jewel, MD  [facility] 11/16/2020, 12:45 PM Pager   If 7PM-7AM, please contact night-coverage [email or web address]

## 2020-11-16 NOTE — Procedures (Signed)
Interventional Radiology Procedure Note  Procedure: Tunneled central venous catheter placement  Findings: Please refer to procedural dictation for full description. 5 Fr, 25 cm Proline.  Tip located at the cavoatrial junction.  Complications: None immediate  Estimated Blood Loss: <5 mL  Recommendations: Line ready for immediate use.   Ruthann Cancer, MD

## 2020-11-16 NOTE — Progress Notes (Signed)
DISCHARGE NOTE HOME Brendan [name] to be discharged Skilled nursing facility per MD order. Discussed prescriptions and follow up appointments with the patient. Prescriptions given to patient; medication list explained in detail. Patient verbalized understanding.  Skin clean, dry and intact without evidence of skin break down, no evidence of skin tears noted. IV catheter discontinued intact. Site without signs and symptoms of complications. Dressing and pressure applied. Pt denies pain at the site currently. No complaints noted.  Patient free of lines, drains, and wounds.   An After Visit Summary (AVS) was printed and given to the patient. Patient escorted via wheelchair, and discharged home via private auto.  Arlyss Repress, RN

## 2020-11-16 NOTE — TOC Transition Note (Signed)
Transition of Care [facility]) - CM/SW Discharge Note   Patient Details  Name: Brendan [name] MRN: [id number] Date of Birth: 10/16/1961  Transition [facility]) CM/SW Contact:  Bartholomew Crews, RN Phone Number: [phone] 11/16/2020, 1:26 PM   Clinical Narrative:     Patient to transition back to [facility] today. DC summary complete. Spoke with Melissa at [facility] - they are ready to receive patient back. PTAR arranged. Medical transport paperwork on the chart. Spoke with patient at the bedside - asked if he wanted NCM to call Elmo Putt, he stated that he would call her himself. Nurse to call report to [facility] at [phone]. No further TOC needs identified at this time.   Final next level of care: Long Term Nursing Home Barriers to Discharge: No Barriers Identified   Patient Goals and CMS Choice Patient states their goals for this hospitalization and ongoing recovery are:: Patient agreeable to returning to [facility] where he is a long-term care resident CMS Medicare.gov Compare Post Acute Care list provided to:: Other (Comment Required) (Medicare.gov list not needed as patient returning to [facility]) Choice offered to / list presented to : Sibling  Discharge Placement   Existing PASRR number confirmed : 11/16/20          Patient chooses bed at: [facility] Patient to be transferred to facility by: [place] Name of family member notified: patient stated that he will call Elmo Putt himself    Discharge Plan and Services In-house Referral: Clinical Social Work   Post Acute Care Choice: [place]            DME Agency: NA       HH Arranged: NA [place] Agency: NA        Social Determinants of Health (SDOH) Interventions     Readmission Risk Interventions No flowsheet data found.

## 2020-11-16 NOTE — Progress Notes (Signed)
   Patient Status: [facility] - In-pt  Assessment and Plan: Patient in need of venous access.   Tunneled central catheter placement  ______________________________________________________________________   History of Present Illness: Brendan [name] is a 59 y.o. 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   Review of Systems: A 12 point ROS discussed and pertinent positives are indicated in the HPI above.  All other systems are negative.   Vital Signs: BP (!) 142/77 (BP Location: Left Arm)   Pulse (!) 103   Temp 99.3 F (37.4 C) (Oral)   Resp 18   Ht 6\' 1"  (1.854 m)   Wt 255 lb 15.3 oz (116.1 kg)   SpO2 97%   BMI 33.77 kg/m     Imaging reviewed.   Labs:  COAGS: Recent Labs    01/23/20 0235 10/30/20 0009 10/30/20 1038 10/31/20 0745 11/02/20 0813  INR 1.2 1.1 1.6* 1.9* 1.2  APTT 40* 32 39* 46*  --     BMP: Recent Labs    02/10/20 0500 02/11/20 0536 02/12/20 0358 04/21/20 1300 04/21/20 1325 11/13/20 0502 11/14/20 0405 11/15/20 0622 11/16/20 0133  NA 135 135 136 139   < > 138 137 138 138  137  K 4.0 4.4 4.8 4.5   < > 4.2 4.5 4.6 4.6  4.7  CL 99 100 102 105   < > 108 109 110 110  109  CO2 23 24 22 24    < > 22 19* 19* 20*  21*  GLUCOSE 151* 165* 184* 96   < > 116* 97 116* 106*  109*  BUN 50* 52* 49* 52*   < > 36* 33* 32* 33*  34*  CALCIUM 8.9 8.9 9.2 9.2   < > 8.4* 8.6* 8.6* 8.7*  8.7*  CREATININE 2.33* 2.29* 2.16* 2.93*   < > 3.50* 3.40* 3.21* 3.27*  3.30*  GFRNONAA 29* 30* 32* 22*   < > 19* 20* 21* 21*  20*  GFRAA 34* 35* 37* 26*  --   --   --   --   --    < > = values in this interval not displayed.    Scheduled for tunneled central catheter  placement Long term antibiotics for sepsis/bacteremia Obstructive uropathy pts sister Elmo Putt is aware of procedure benefits and risks Including but not limited to: infection; bleeding; vessel damage Consent signe via phone-- all questions answered.    Electronically Signed: Lavonia Drafts, PA-C 11/16/2020, 6:58 AM   I spent a total of 15 minutes in face to face in clinical consultation, greater than 50% of which was counseling/coordinating care for venous access.

## 2020-11-24 ENCOUNTER — Telehealth: Payer: Self-pay

## 2020-11-24 ENCOUNTER — Encounter: Payer: Self-pay | Admitting: Internal Medicine

## 2020-11-24 ENCOUNTER — Other Ambulatory Visit (HOSPITAL_COMMUNITY): Payer: Self-pay | Admitting: Internal Medicine

## 2020-11-24 ENCOUNTER — Other Ambulatory Visit: Payer: Self-pay

## 2020-11-24 ENCOUNTER — Ambulatory Visit (INDEPENDENT_AMBULATORY_CARE_PROVIDER_SITE_OTHER): Payer: Medicaid Other | Admitting: Internal Medicine

## 2020-11-24 VITALS — BP 147/88 | HR 96 | Temp 98.7°F

## 2020-11-24 DIAGNOSIS — K661 Hemoperitoneum: Secondary | ICD-10-CM

## 2020-11-24 DIAGNOSIS — B952 Enterococcus as the cause of diseases classified elsewhere: Secondary | ICD-10-CM | POA: Diagnosis not present

## 2020-11-24 DIAGNOSIS — R7881 Bacteremia: Secondary | ICD-10-CM

## 2020-11-24 DIAGNOSIS — N135 Crossing vessel and stricture of ureter without hydronephrosis: Secondary | ICD-10-CM

## 2020-11-24 NOTE — Telephone Encounter (Signed)
Per Md contacted IR to schedule appointment for picc removal after 12/26. Once appointment is scheduled will need to contact [facility] with appointment information.  P:  [phone]

## 2020-11-24 NOTE — Telephone Encounter (Signed)
Spoke with Brendan [name] regarding picc appointment. Is scheduled for 12/30 at 11. Appointment was confirmed before ending call. [place]

## 2020-11-24 NOTE — Patient Instructions (Signed)
I have ordered a referral to IR for removal of your tunneled central line after completing your antibiotics.  They will be in touch to have this scheduled.   Take care.

## 2020-11-24 NOTE — Progress Notes (Signed)
[facility]  CHIEF COMPLAINT:    Follow up for E faecalis bacteremia  SUBJECTIVE:    Brendan [name] is a 59 y.o. male with PMHx as below who presents to the clinic for follow up E faecalis bacteremia.   Please see A&P for the details of today's visit and status of the patient's medical problems.   Patient's Medications  New Prescriptions   No medications on file  Previous Medications   ACETAMINOPHEN (TYLENOL) 325 MG TABLET    Take 650 mg by mouth every 8 (eight) hours as needed for moderate pain or headache.   AMINO ACIDS-PROTEIN HYDROLYS (FEEDING SUPPLEMENT, PRO-STAT SUGAR FREE 64,) LIQD    Take 30 mLs by mouth 3 (three) times daily.   AMPICILLIN IVPB    Inject 2 g into the vein every 8 (eight) hours for 23 days. Indication:  Enterococcal bacteremia First Dose: Yes Last Day of Therapy:  11/27/2020 Labs - Once weekly:  CBC/D and BMP, Labs - Every other week:  ESR and CRP Method of administration: Ambulatory Pump (Continuous Infusion) Method of administration may be changed at the discretion of home infusion pharmacist based upon assessment of the patient and/or caregiver's ability to self-administer the medication ordered.   CALCIUM CARBONATE (CALTRATE 600) 1500 (600 CA) MG TABS TABLET    Take 1 tablet by mouth 2 (two) times daily.    CETAPHIL (CETAPHIL) LOTION    Apply 1 application topically every 12 (twelve) hours as needed for dry skin. Apply to arms, legs, and feet   CHOLECALCIFEROL 1000 UNITS TABLET    Take 2,000 Units by mouth daily.    EZETIMIBE (ZETIA) 10 MG TABLET    Take 10 mg by mouth every evening.    FAMOTIDINE (PEPCID) 20 MG TABLET    Take 1 tablet (20 mg total) by mouth at bedtime.   FEEDING SUPPLEMENT (ENSURE ENLIVE / ENSURE PLUS) LIQD    Take 237 mLs by mouth 3 (three) times daily between meals.   HEPARIN 5000 UNIT/ML INJECTION    Inject 1 mL (5,000 Units total) into the skin every 8 (eight) hours.   HYDROCODONE-ACETAMINOPHEN  (NORCO/VICODIN) 5-325 MG TABLET    Take 1 tablet by mouth every 6 (six) hours as needed for pain.   MULTIVITAMIN (RENA-VIT) TABS TABLET    Take 1 tablet by mouth at bedtime.   MYCOPHENOLATE (CELLCEPT) 500 MG TABLET    Take 500 mg by mouth 2 (two) times daily.   NUTRITIONAL SUPPLEMENTS (,FEEDING SUPPLEMENT, PROSOURCE PLUS) LIQUID    Take 30 mLs by mouth 2 (two) times daily with a meal.   OMEGA-3 FATTY ACIDS (FISH OIL) 1000 MG CAPS    Take 1,000 mg by mouth in the morning and at bedtime.   POLYETHYLENE GLYCOL (MIRALAX / GLYCOLAX) 17 G PACKET    Take 17 g by mouth daily as needed for mild constipation.   SENNA (SENOKOT) 8.6 MG TABS TABLET    Take 8.6 mg by mouth daily.    SIMVASTATIN (ZOCOR) 80 MG TABLET    Take 80 mg by mouth at bedtime.   SODIUM BICARBONATE 650 MG TABLET    Take 650 mg by mouth 3 (three) times daily.   TAMSULOSIN (FLOMAX) 0.4 MG CAPS CAPSULE    Take 0.4 mg by mouth at bedtime.  Modified Medications   No medications on file  Discontinued Medications   No medications on file     Past Medical History:  Diagnosis  Date  . Acidosis   . Acute unilateral obstructive uropathy   . Allergic rhinitis   . Chronic kidney disease, stage IV (severe) ([place])   . Constipation   . Coronary atherosclerosis of native coronary artery   . Diabetes mellitus without complication ([place])   . DVT (deep venous thrombosis) ([place])   . Enlarged prostate with urinary obstruction   . Essential hypertension, malignant   . GERD (gastroesophageal reflux disease)   . Glaucoma   . Hemiparesis affecting right side as late effect of cerebrovascular accident ([place])   . Hemiplegia ([place])   . Hydronephrosis   . Hyperlipidemia   . Hypertension   . Hypokalemia   . Muscle weakness (generalized)   . Open wound of scalp without complication   . PVD (peripheral vascular disease) ([place])   . Retroperitoneal fibrosis   . Stricture or kinking of ureter   . Stroke [facility])    right sided hemiparesis  . Vitamin D  deficiency     Family History  Family history unknown: Yes    Social History   Socioeconomic History  . Marital status: Single    Spouse name: Not on file  . Number of children: Not on file  . Years of education: Not on file  . Highest education level: Not on file  Occupational History  . Not on file  Tobacco Use  . Smoking status: Never Smoker  . Smokeless tobacco: Never Used  Vaping Use  . Vaping Use: Never used  Substance and Sexual Activity  . Alcohol use: Not Currently  . Drug use: Not Currently  . Sexual activity: Not Currently  Other Topics Concern  . Not on file  Social History Narrative  . Not on file   Social Determinants of Health   Financial Resource Strain: Not on file  Food Insecurity: Not on file  Transportation Needs: Not on file  Physical Activity: Not on file  Stress: Not on file  Social Connections: Not on file  Intimate Partner Violence: Not on file    Allergies  Allergen Reactions  . Chlorhexidine   . Other     TB serum-reaction unknown. Provided via [facility] records     Review of Systems  Constitutional: Negative for chills and fever.  Respiratory: Negative.   Cardiovascular: Negative.   Gastrointestinal: Negative for diarrhea, nausea and vomiting.  Genitourinary:       Making urine and having good output.      OBJECTIVE:    Vitals:   11/24/20 1428  BP: (!) 147/88  Pulse: 96  Temp: 98.7 F (37.1 C)  TempSrc: Oral   There is no height or weight on file to calculate BMI.  Physical Exam Constitutional:      Comments: Chronically ill appearing man, lying in chair, NAD.  HENT:     Head: Normocephalic and atraumatic.  Cardiovascular:     Comments: Tunneled CVC line.  C/D/I. Skin:    General: Skin is warm and dry.  Neurological:     General: No focal deficit present.     Mental Status: He is oriented to person, place, and time.       Pertinent Labs and Microbiology CBC Latest Ref Rng & Units 11/16/2020 11/15/2020  11/14/2020  WBC 4.0 - 10.5 K/uL 4.7 5.0 5.2  Hemoglobin 13.0 - 17.0 g/dL 8.5(L) 7.8(L) 7.8(L)  Hematocrit 39.0 - 52.0 % 26.7(L) 25.6(L) 25.6(L)  Platelets 150 - 400 K/uL 389 376 368   CMP Latest Ref Rng & Units 11/16/2020  11/16/2020 11/15/2020  Glucose 70 - 99 mg/dL 106(H) 109(H) 116(H)  BUN 8 - 23 mg/dL 33(H) 34(H) 32(H)  Creatinine 0.61 - 1.24 mg/dL 3.27(H) 3.30(H) 3.21(H)  Sodium 135 - 145 mmol/L 138 137 138  Potassium 3.5 - 5.1 mmol/L 4.6 4.7 4.6  Chloride 98 - 111 mmol/L 110 109 110  CO2 22 - 32 mmol/L 20(L) 21(L) 19(L)  Calcium 8.9 - 10.3 mg/dL 8.7(L) 8.7(L) 8.6(L)  Total Protein 6.5 - 8.1 g/dL - - -  Total Bilirubin 0.3 - 1.2 mg/dL - - -  Alkaline Phos 38 - 126 U/L - - -  AST 15 - 41 U/L - - -  ALT 0 - 44 U/L - - -     Recent Results (from the past 240 hour(s))  SARS Coronavirus 2 by RT PCR (hospital order, performed in [facility] hospital lab) Nasopharyngeal Nasopharyngeal Swab     Status: None   Collection Time: 11/15/20 12:39 PM   Specimen: Nasopharyngeal Swab  Result Value Ref Range Status   SARS Coronavirus 2 NEGATIVE NEGATIVE Final    Comment: (NOTE) SARS-CoV-2 target nucleic acids are NOT DETECTED.  The SARS-CoV-2 RNA is generally detectable in upper and lower respiratory specimens during the acute phase of infection. The lowest concentration of SARS-CoV-2 viral copies this assay can detect is 250 copies / mL. A negative result does not preclude SARS-CoV-2 infection and should not be used as the sole basis for treatment or other patient management decisions.  A negative result may occur with improper specimen collection / handling, submission of specimen other than nasopharyngeal swab, presence of viral mutation(s) within the areas targeted by this assay, and inadequate number of viral copies (<250 copies / mL). A negative result must be combined with clinical observations, patient history, and epidemiological information.  Fact Sheet for Patients:    StrictlyIdeas.no  Fact Sheet for Healthcare Providers: BankingDealers.co.za  This test is not yet approved or  cleared by the [place] FDA and has been authorized for detection and/or diagnosis of SARS-CoV-2 by FDA under an Emergency Use Authorization (EUA).  This EUA will remain in effect (meaning this test can be used) for the duration of the COVID-19 declaration under Section 564(b)(1) of the Act, 21 U.S.C. section 360bbb-3(b)(1), unless the authorization is terminated or revoked sooner.  Performed at [facility], [place] [address], [place], [place] [address]      ASSESSMENT & PLAN:    1. Enterococcal bacteremia  2. Retroperitoneal hematoma  3. 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  He had a TEE during admission that was negative for endocarditis.  4-week course of ampicillin was chosen at discharge due to complicated bacteremia and large hematoma/complex bleed.  He was discharged to a SNF to complete his therapy with end date of 11/28/20.   Has been getting IV antibiotics without issue at SNF.  Line working well and no complications.  No fevers, chills.  No recurrent abdominal pain and having good UOP.  PLAN: -- continue ampicillin through 11/28/20 -- IR referral placed for tunneled CVC line removal after abx -- RTC as needed    Orders Placed This Encounter  Procedures  . Ambulatory referral to Interventional Radiology    Referral Priority:   Routine    Referral Type:   Consultation    Referral Reason:   Specialty  Services Required    Requested Specialty:   Interventional Radiology    Number of Visits Requested:   [facility] [place]  [facility] 11/24/2020, 3:03 PM

## 2020-12-02 ENCOUNTER — Other Ambulatory Visit: Payer: Self-pay

## 2020-12-02 ENCOUNTER — Ambulatory Visit (HOSPITAL_COMMUNITY)
Admission: RE | Admit: 2020-12-02 | Discharge: 2020-12-02 | Disposition: A | Discharge status: Home / Self Care | Payer: Medicaid Other | Source: Ambulatory Visit | Attending: Internal Medicine | Admitting: Internal Medicine

## 2020-12-02 DIAGNOSIS — Z4901 Encounter for fitting and adjustment of extracorporeal dialysis catheter: Secondary | ICD-10-CM | POA: Diagnosis not present

## 2020-12-02 DIAGNOSIS — R7881 Bacteremia: Secondary | ICD-10-CM | POA: Insufficient documentation

## 2020-12-02 HISTORY — PX: IR REMOVAL TUN CV CATH W/O FL: IMG2289

## 2020-12-02 NOTE — Procedures (Signed)
PROCEDURE SUMMARY:  Successful removal of right IJ tunneled CVC, removed intact. No immediate complications.  EBL <1 mL. Pressure dressing (gauze + tegaderm) applied to site. Patient tolerated well.   Post-removal instructions: 1- Ok to shower 24 hours post-removal. 2- No submerging (swimming, bathing) for 7 days post-removal. 3- No further dressings needed after 48 hours (change during this period only if saturated).  Please see imaging section of Epic for full dictation.   Claris Pong Lucile Hillmann PA-C 12/02/2020 11:17 AM

## 2020-12-25 IMAGING — DX DG ABDOMEN 1V
1 series · 1 of 1 positions shown · non-contrast
Comparison: None.

CLINICAL DATA: Presence of IVC filter

EXAM:
ABDOMEN - 1 VIEW

[abdomen]
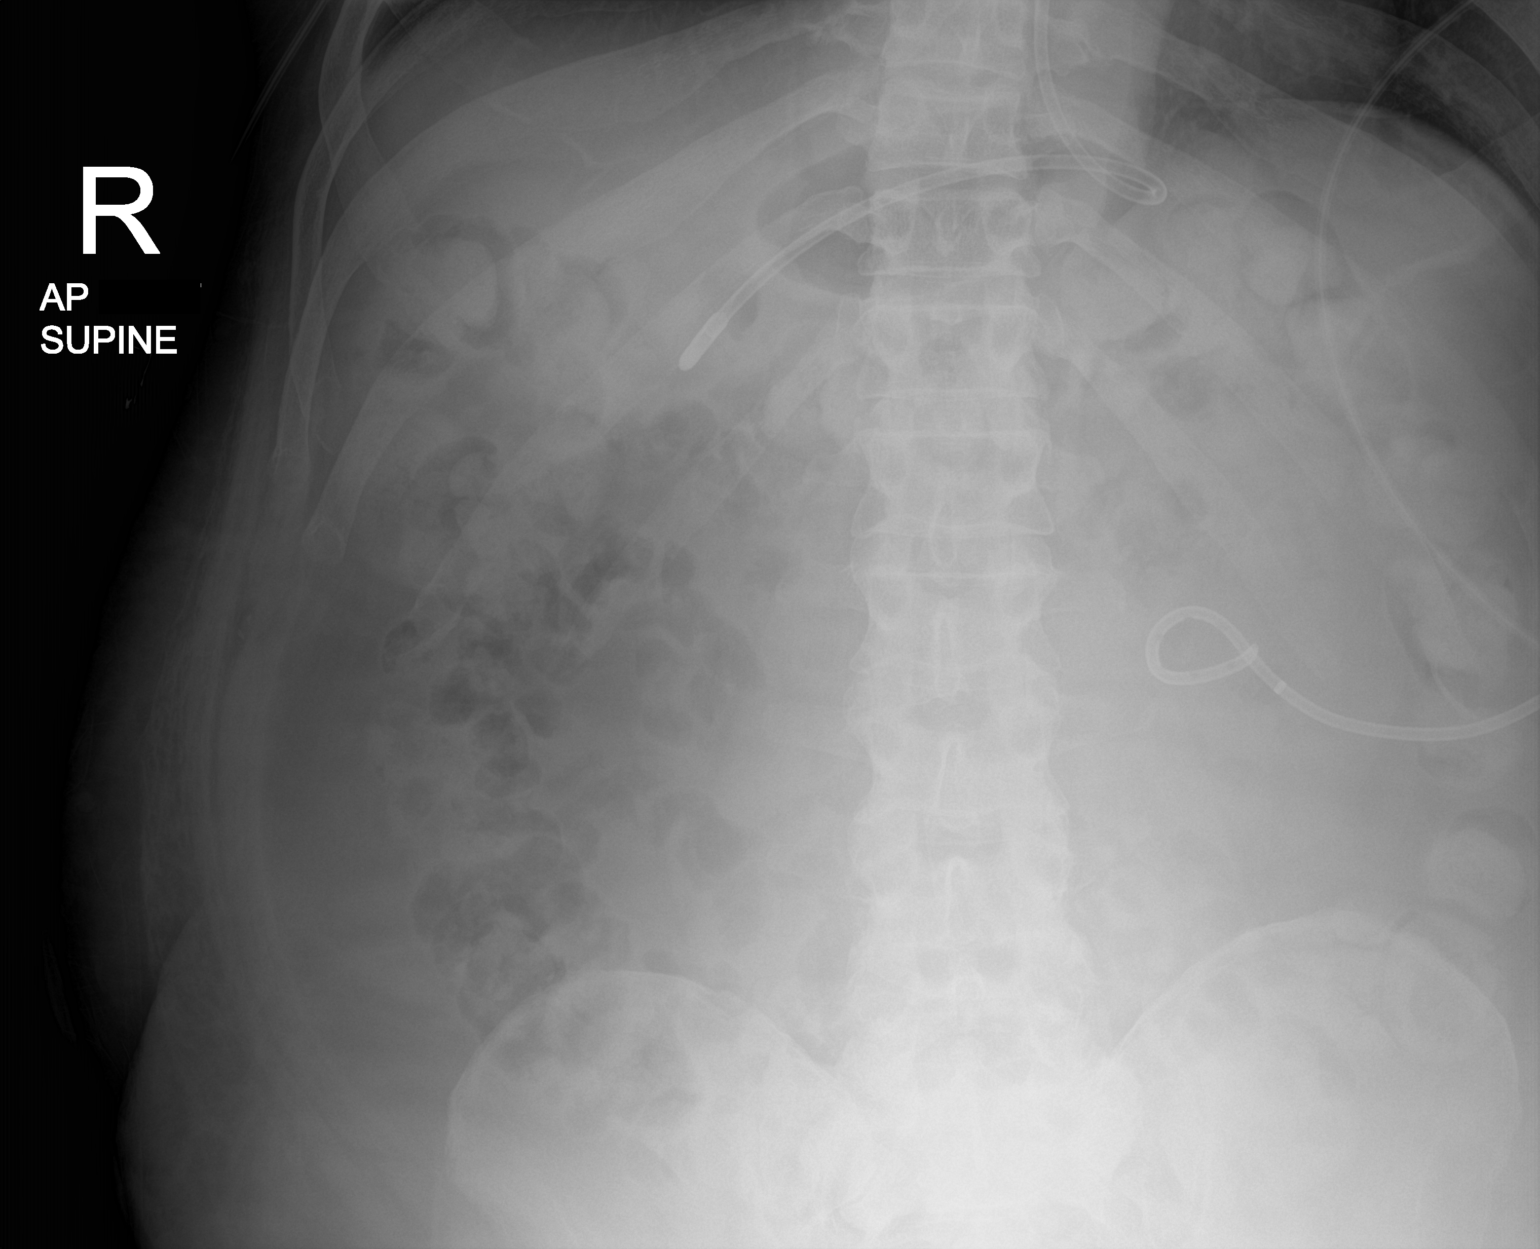

[1 of 1 positions shown; findings below may reference images not displayed]

FINDINGS: No IVC filter in typical location. Feeding tube with tip is at the
proximal duodenum. Catheter over the left flank.
IMPRESSION: Negative for IVC filter.

## 2020-12-25 IMAGING — DX DG CHEST 1V PORT
1 series · 1 of 1 positions shown · non-contrast
Comparison: 12/18/2019

CLINICAL DATA: Fever

EXAM:
PORTABLE CHEST 1 VIEW

[chest]
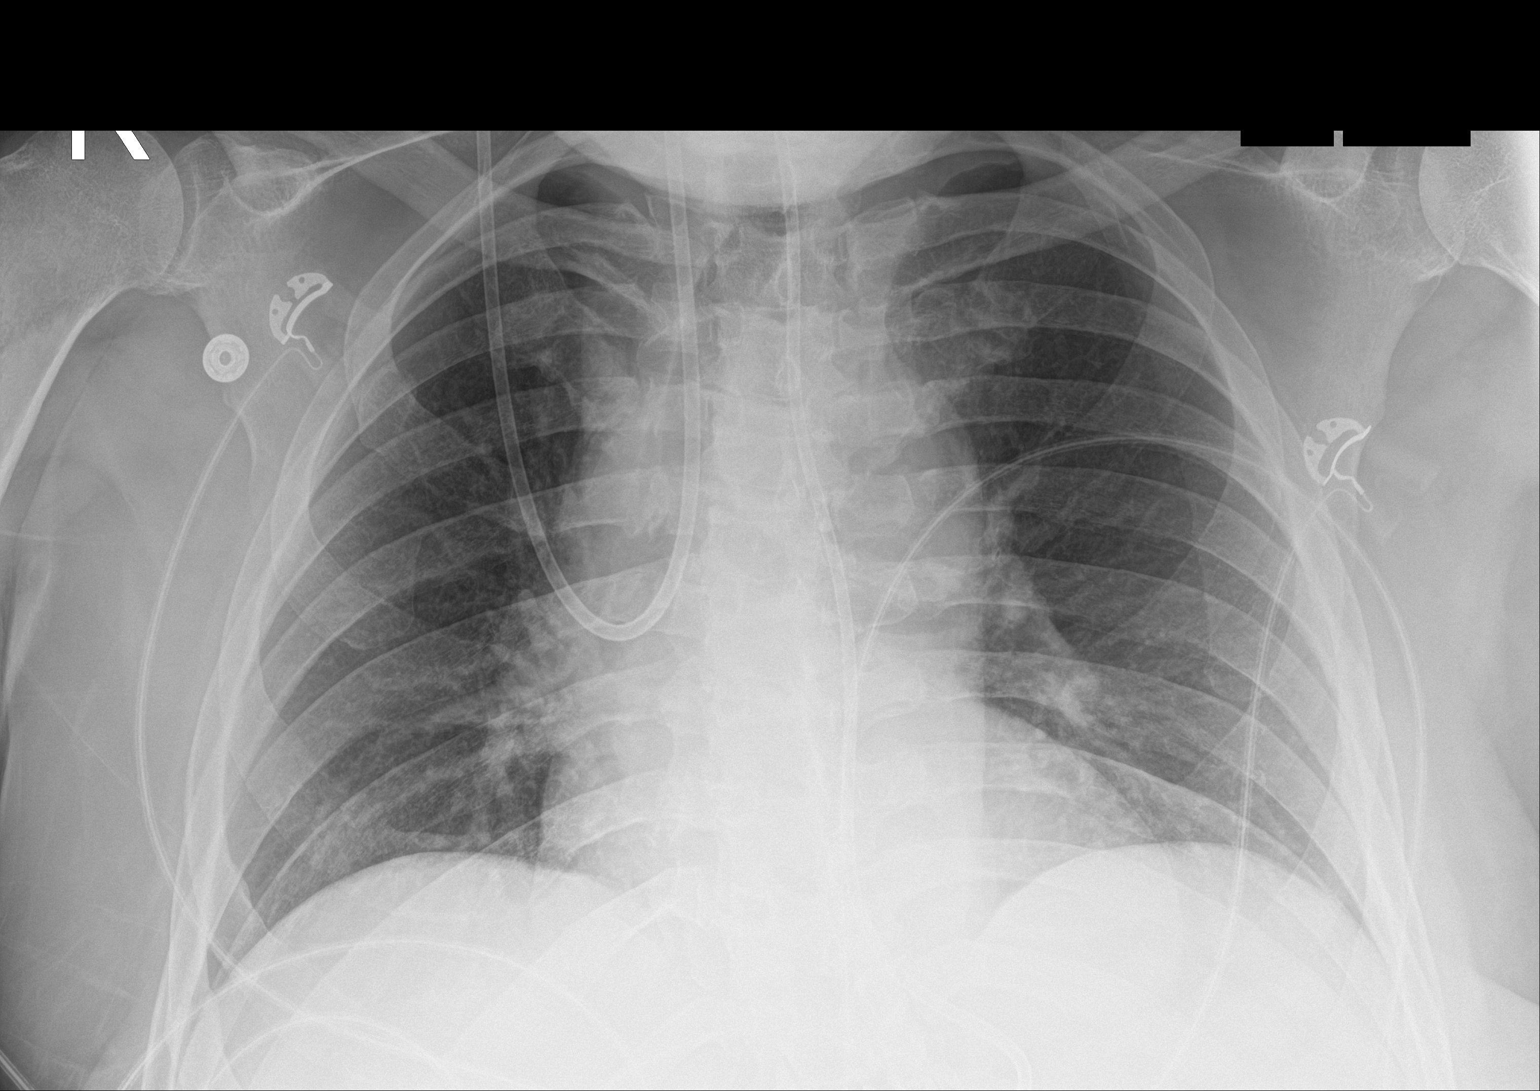

[1 of 1 positions shown; findings below may reference images not displayed]

FINDINGS: Soft feeding tube enters the abdomen. Heart size remains normal.
There is mild atelectasis at both lung bases. No lobar collapse.
Upper lobes remain clear. No effusions.
IMPRESSION: Mild bibasilar atelectasis.
